# Patient Record
Sex: Female | Born: 1935 | Race: Black or African American | Hispanic: No | Smoking: Former smoker
Health system: Southern US, Community
[De-identification: ages and names within clinical notes are randomized; demographics above are authoritative.]

## PROBLEM LIST (undated history)

## (undated) DIAGNOSIS — I639 Cerebral infarction, unspecified: Secondary | ICD-10-CM

## (undated) DIAGNOSIS — K5900 Constipation, unspecified: Secondary | ICD-10-CM

## (undated) DIAGNOSIS — E559 Vitamin D deficiency, unspecified: Secondary | ICD-10-CM

## (undated) DIAGNOSIS — D649 Anemia, unspecified: Secondary | ICD-10-CM

## (undated) DIAGNOSIS — E0590 Thyrotoxicosis, unspecified without thyrotoxic crisis or storm: Secondary | ICD-10-CM

## (undated) DIAGNOSIS — F0390 Unspecified dementia without behavioral disturbance: Secondary | ICD-10-CM

## (undated) DIAGNOSIS — I499 Cardiac arrhythmia, unspecified: Secondary | ICD-10-CM

## (undated) DIAGNOSIS — G9340 Encephalopathy, unspecified: Secondary | ICD-10-CM

## (undated) DIAGNOSIS — N184 Chronic kidney disease, stage 4 (severe): Secondary | ICD-10-CM

## (undated) DIAGNOSIS — E119 Type 2 diabetes mellitus without complications: Secondary | ICD-10-CM

## (undated) HISTORY — DX: Constipation, unspecified: K59.00

## (undated) HISTORY — DX: Vitamin D deficiency, unspecified: E55.9

## (undated) HISTORY — DX: Unspecified dementia without behavioral disturbance: F03.90

## (undated) HISTORY — DX: Cardiac arrhythmia, unspecified: I49.9

## (undated) HISTORY — DX: Anemia, unspecified: D64.9

## (undated) HISTORY — DX: Cerebral infarction, unspecified: I63.9

## (undated) HISTORY — DX: Type 2 diabetes mellitus without complications: E11.9

---

## 1999-10-21 ENCOUNTER — Encounter: Payer: Self-pay | Admitting: Internal Medicine

## 1999-10-21 ENCOUNTER — Encounter: Admission: RE | Admit: 1999-10-21 | Discharge: 1999-10-21 | Payer: Self-pay | Admitting: Internal Medicine

## 2003-04-16 ENCOUNTER — Encounter: Admission: RE | Admit: 2003-04-16 | Discharge: 2003-04-16 | Payer: Self-pay | Admitting: *Deleted

## 2004-06-09 ENCOUNTER — Encounter: Admission: RE | Admit: 2004-06-09 | Discharge: 2004-06-09 | Payer: Self-pay | Admitting: Internal Medicine

## 2004-06-17 ENCOUNTER — Inpatient Hospital Stay (HOSPITAL_COMMUNITY): Admission: EM | Admit: 2004-06-17 | Discharge: 2004-07-23 | Payer: Self-pay | Admitting: Emergency Medicine

## 2004-06-17 ENCOUNTER — Ambulatory Visit: Payer: Self-pay | Admitting: Physical Medicine & Rehabilitation

## 2004-06-25 ENCOUNTER — Ambulatory Visit: Payer: Self-pay | Admitting: Critical Care Medicine

## 2004-06-28 ENCOUNTER — Ambulatory Visit: Payer: Self-pay | Admitting: Cardiology

## 2004-06-28 ENCOUNTER — Encounter: Payer: Self-pay | Admitting: Cardiology

## 2004-09-09 ENCOUNTER — Ambulatory Visit (HOSPITAL_COMMUNITY): Admission: RE | Admit: 2004-09-09 | Discharge: 2004-09-09 | Payer: Self-pay | Admitting: Internal Medicine

## 2004-09-15 ENCOUNTER — Inpatient Hospital Stay (HOSPITAL_COMMUNITY): Admission: EM | Admit: 2004-09-15 | Discharge: 2004-09-30 | Payer: Self-pay | Admitting: Emergency Medicine

## 2004-11-24 ENCOUNTER — Encounter: Admission: RE | Admit: 2004-11-24 | Discharge: 2004-11-24 | Payer: Self-pay | Admitting: Gastroenterology

## 2004-11-26 ENCOUNTER — Inpatient Hospital Stay (HOSPITAL_COMMUNITY): Admission: EM | Admit: 2004-11-26 | Discharge: 2004-11-29 | Payer: Self-pay | Admitting: Emergency Medicine

## 2005-01-28 ENCOUNTER — Ambulatory Visit (HOSPITAL_COMMUNITY): Admission: RE | Admit: 2005-01-28 | Discharge: 2005-01-28 | Payer: Self-pay | Admitting: Internal Medicine

## 2005-10-11 ENCOUNTER — Ambulatory Visit: Payer: Self-pay | Admitting: Gastroenterology

## 2005-10-20 ENCOUNTER — Encounter: Payer: Self-pay | Admitting: Gastroenterology

## 2005-10-20 ENCOUNTER — Ambulatory Visit (HOSPITAL_COMMUNITY): Admission: RE | Admit: 2005-10-20 | Discharge: 2005-10-20 | Payer: Self-pay | Admitting: Gastroenterology

## 2005-10-24 ENCOUNTER — Ambulatory Visit: Payer: Self-pay | Admitting: Gastroenterology

## 2006-02-15 IMAGING — CR DG ABDOMEN 2V
3 series · 3 of 3 positions shown · non-contrast
Comparison: Abdomen and pelvis CT dated 07/08/2004 and abdomen radiograph dated
07/07/2004.

CLINICAL DATA: Abdominal distention.

ABDOMEN - 2 VIEW

[view not recorded (1 of 3)]
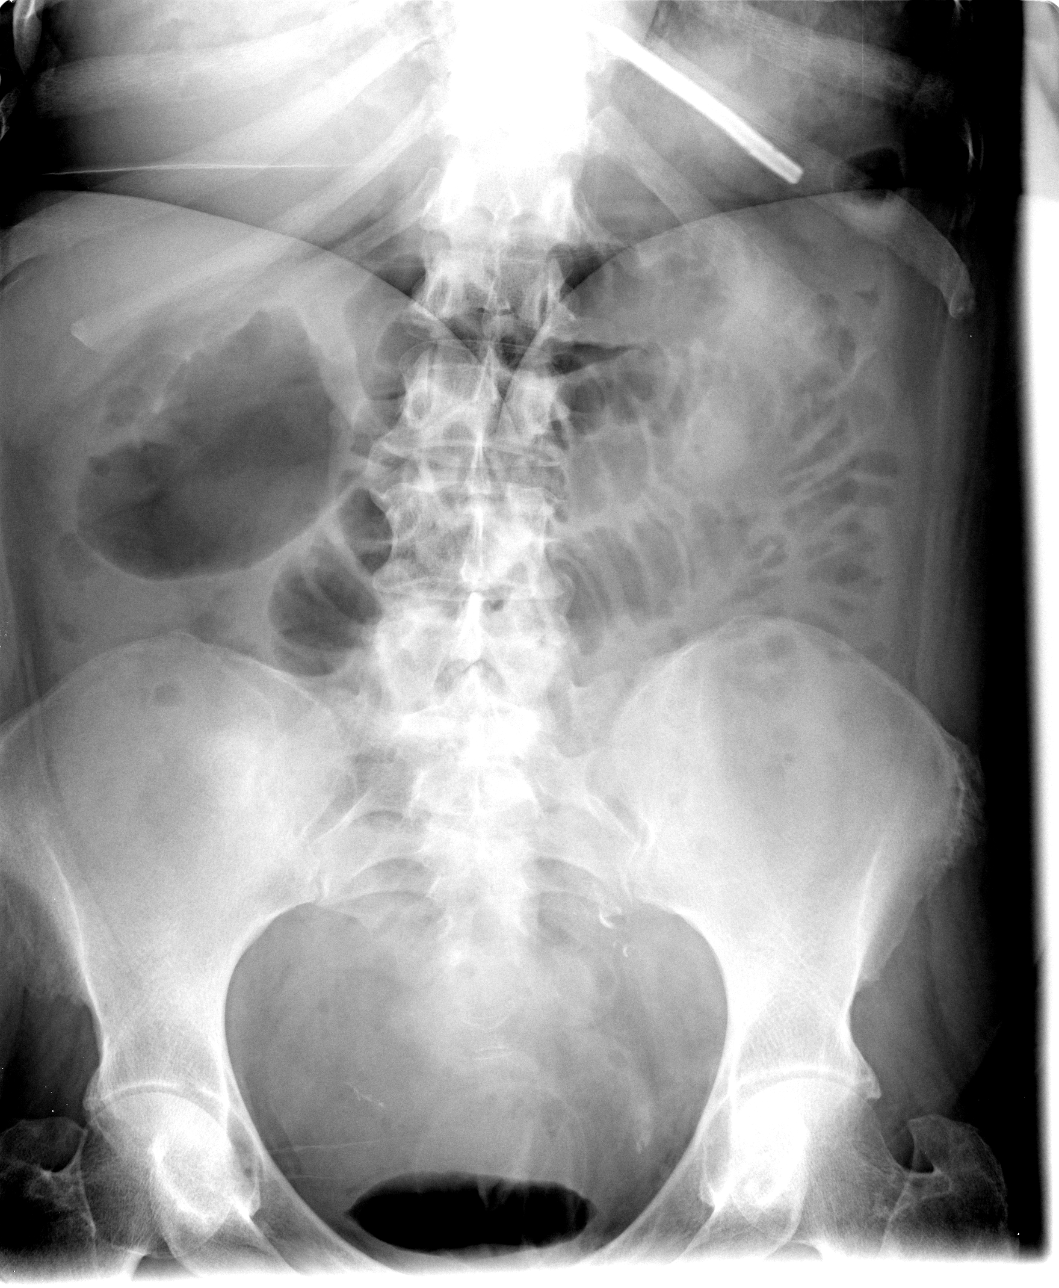

[view not recorded (2 of 3)]
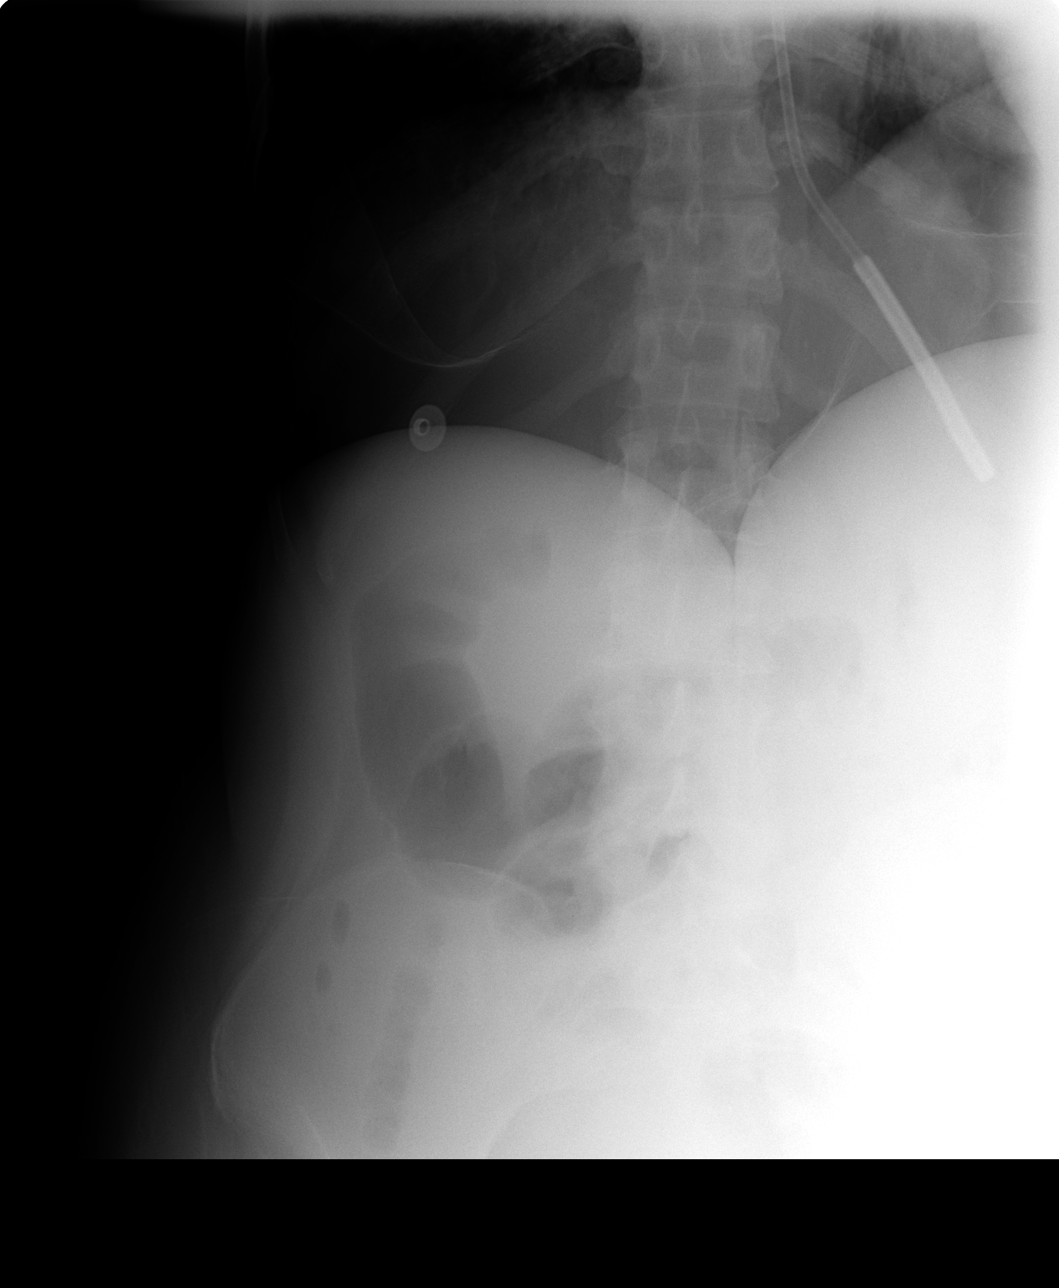

[view not recorded (3 of 3)]
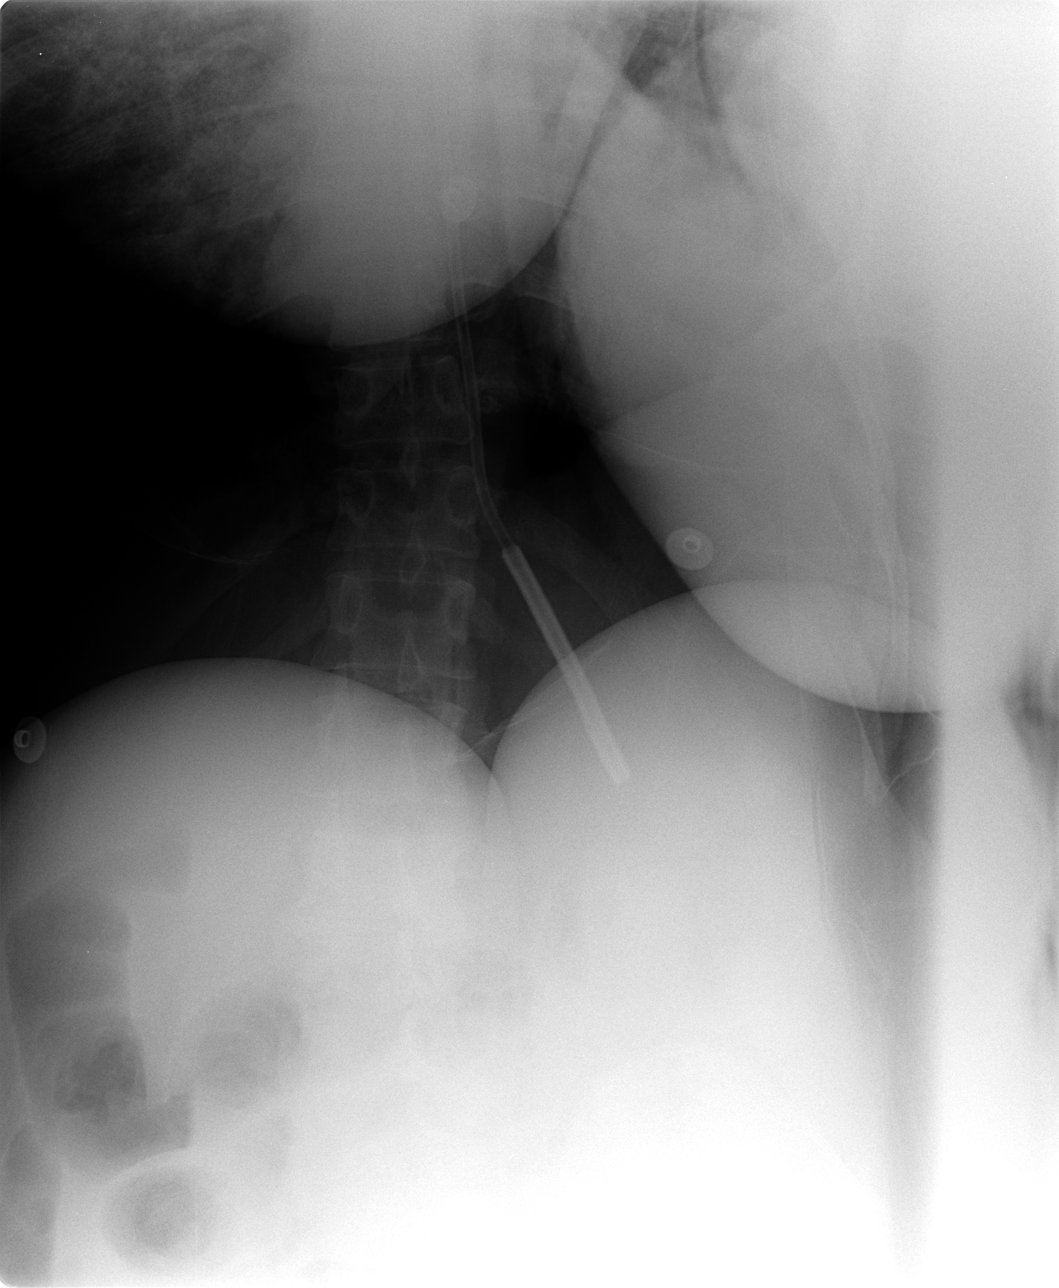

[3 of 3 positions shown; findings below may reference images not displayed]

FINDINGS: Supine and two left lateral decubitus views demonstrate a mild
decrease in caliber of multiple dilated small bowel loops in the mid and left
abdomen, compared to the CT scout image. No free peritoneal air. Feeding tube
tip in the proximal to mid stomach. Air, associated with a previously
demonstrated Foley catheter, is again demonstrated in the urinary bladder. A
rectal tube and balloon also remain in place. Mild scoliosis.

IMPRESSION

Mildly improved small bowel ileus or partial obstruction.

## 2006-08-02 ENCOUNTER — Inpatient Hospital Stay (HOSPITAL_COMMUNITY): Admission: EM | Admit: 2006-08-02 | Discharge: 2006-08-09 | Payer: Self-pay | Admitting: *Deleted

## 2006-08-05 ENCOUNTER — Encounter (INDEPENDENT_AMBULATORY_CARE_PROVIDER_SITE_OTHER): Payer: Self-pay | Admitting: *Deleted

## 2006-08-07 ENCOUNTER — Encounter (INDEPENDENT_AMBULATORY_CARE_PROVIDER_SITE_OTHER): Payer: Self-pay | Admitting: *Deleted

## 2007-03-21 ENCOUNTER — Ambulatory Visit (HOSPITAL_COMMUNITY): Admission: RE | Admit: 2007-03-21 | Discharge: 2007-03-21 | Payer: Self-pay | Admitting: Internal Medicine

## 2007-04-05 ENCOUNTER — Encounter (INDEPENDENT_AMBULATORY_CARE_PROVIDER_SITE_OTHER): Payer: Self-pay | Admitting: *Deleted

## 2007-04-05 ENCOUNTER — Ambulatory Visit (HOSPITAL_COMMUNITY): Admission: RE | Admit: 2007-04-05 | Discharge: 2007-04-05 | Payer: Self-pay | Admitting: Internal Medicine

## 2007-04-19 ENCOUNTER — Ambulatory Visit: Payer: Self-pay | Admitting: Cardiology

## 2008-07-21 ENCOUNTER — Ambulatory Visit: Payer: Self-pay | Admitting: Gastroenterology

## 2009-01-15 ENCOUNTER — Emergency Department (HOSPITAL_COMMUNITY): Admission: EM | Admit: 2009-01-15 | Discharge: 2009-01-16 | Payer: Self-pay | Admitting: Emergency Medicine

## 2010-03-07 ENCOUNTER — Encounter: Payer: Self-pay | Admitting: Internal Medicine

## 2010-05-18 LAB — PROTIME-INR
INR: 1.05 (ref 0.00–1.49)
Prothrombin Time: 13.6 seconds (ref 11.6–15.2)

## 2010-05-18 LAB — BASIC METABOLIC PANEL
BUN: 24 mg/dL — ABNORMAL HIGH (ref 6–23)
CO2: 27 mEq/L (ref 19–32)
Chloride: 110 mEq/L (ref 96–112)
Creatinine, Ser: 1.01 mg/dL (ref 0.4–1.2)
GFR calc non Af Amer: 54 mL/min — ABNORMAL LOW (ref >60)
Glucose, Bld: 94 mg/dL (ref 70–99)
Potassium: 4.3 mEq/L (ref 3.5–5.1)
Sodium: 141 mEq/L (ref 135–145)

## 2010-05-18 LAB — CBC: MCV: 81.9 fL (ref 78.0–100.0)

## 2010-05-18 LAB — DIFFERENTIAL
Basophils Absolute: 0 10*3/uL (ref 0.0–0.1)
Basophils Relative: 1 % (ref 0–1)
Eosinophils Relative: 2 % (ref 0–5)
Lymphocytes Relative: 35 % (ref 12–46)
Monocytes Relative: 5 % (ref 3–12)

## 2010-06-29 NOTE — Discharge Summary (Signed)
Sylvia Medina, Sylvia Medina              ACCOUNT NO.:  [id number]   MEDICAL RECORD NO.:  [id number]          PATIENT TYPE:  INP   LOCATION:  1526                         FACILITY:  [facility]   PHYSICIAN:  Isidor Holts, M.D.  DATE OF BIRTH:  Aug 19, 1935   DATE OF ADMISSION:  08/01/2006  DATE OF DISCHARGE:  08/09/2006                               DISCHARGE SUMMARY   For discharge diagnosis, procedures, consultations, clinical course,  discharge medications, refer to discharge summary dated August 08, 2006,  by Michaelyn Barter, M.D.   On review in the a.m. August 09, 2006, the patient's clinical condition  has remained stable.  There were no new issues overnight.  CBG's are  reasonable and laboratory findings show hemoglobin of 10.2 with  hematocrit of 31.0.  Review of bacteriologic studies indicate that urine  culture on August 02, 2006, grew over 100,000 colonies of Proteus  mirabilis sensitive only to the Cephalosporins.  The patient has  therefore been commenced on a 5-day course of Keflex 500 mg via tube  b.i.d.  She is otherwise considered clinically stable for discharge on  August 09, 2006, as planned.   ADDITIONAL DIAGNOSIS:  Proteus mirabilis urinary tract infection.   ADDITIONAL MEDICATION:  Keflex 500 mg via tube b.i.d. for 5 days only.      Isidor Holts, M.D.  Electronically Signed     CO/MEDQ  D:  08/09/2006  T:  08/09/2006  Job:  [id number]   cc:   Maxwell Caul, M.D.

## 2010-06-29 NOTE — H&P (Signed)
Sylvia Medina, POLENDO              ACCOUNT NO.:  [id number]   MEDICAL RECORD NO.:  [id number]          PATIENT TYPE:  EMS   LOCATION:  ED                           FACILITY:  [facility]   PHYSICIAN:  Mobolaji B. Bakare, M.D.DATE OF BIRTH:  [birth year]   DATE OF ADMISSION:  08/01/2006  DATE OF DISCHARGE:                              HISTORY & PHYSICAL   PRIMARY CARE PHYSICIAN:  Maxwell Caul, M.D.   CHIEF COMPLAINT:  Low hemoglobin, 6.2 gm/dl.   HISTORY OF PRESENTING COMPLAINT:  Sylvia Medina is an unfortunate 75-year-  old African-American female who had hypertensive intracerebellar bleed  in 2006, which resulted in neurological deficit.  She had been residing  in a skilled nursing facility since then.   Patient presents today after a lab work done at the nursing home  revealed a hemoglobin of 6.2 and MCV of 67.2.  The patient has no  specific complaints.  She denies hematemesis, hematochezia.  She denies  constipation or diarrhea.  Daughter is here today giving most of the  history.  The patient has baseline dementia.  Stool Hemoccults done in  the emergency room was positive.   Patient had colonoscopy done in August, 2007 for positive stool  Hemoccult.  According to the patient's daughter, the result was normal  colonoscopy.   REVIEW OF SYSTEMS:  There are no chills, shortness of breath, chest  pain, headaches, fever, nausea, vomiting, or diarrhea.  The patient is  incontinent of urine and probably stool.   PAST MEDICAL HISTORY:  1. Diabetes mellitus.  2. 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  3. Hypertensive intracerebellar bleed.  4. Dementia.  5. Status post stent placement in 2006.  6. Chronic anemia.  7. Hypothyroidism.  8. Gout.  9. Dementia.  10.History of sepsis/septic shock in October, 2006.  11.History of acute renal failure.   PAST SURGICAL HISTORY:  PEG tube placement in August, 2006.   CURRENT MEDICATIONS:  1. Lantus  10 units nightly.  2. Sliding-scale insulin.  3. Lexapro 20 mg daily.  4. Ferrous sulfate 300 mg daily.  5. Risperdal 0.5 mg daily.  6. Vitamins 500 cc daily.  7. Aricept 10 mg daily.  8. Beniprotein 1 scope t.i.d.  9. Jevity 1.2 300 cc via PEG q.4h. with water flushes.   ALLERGIES:  PENICILLIN.   FAMILY HISTORY:  Noncontributory.   SOCIAL HISTORY:  Patient is limited in activities of daily living.  She  resides in a skilled nursing facility.  Does not smoke cigarettes, does  not drink alcohol.   PHYSICAL EXAMINATION:  INITIAL VITALS:  Temperature 98.9, blood pressure  135/61, pulse 83, respiratory rate 20, O2 sats 97%.  GENERAL:  She is awake and alert.  Sometimes answers questions  inappropriately.  Patient is obese but not in respiratory distress.  NECK:  No elevated JVD.  No carotid bruits.  HEENT:  Mucous membranes moist.  LUNGS:  Clear clinically to auscultation.  CVS:  S1 and S2, regular.  ABDOMEN:  Nondistended.  Soft, nontender, bowel sounds present.  RECTAL:  She has stool soilage  on her buttocks.  Stool was brown in  color.  Heme positive.  Digital examination was not done.  EXTREMITIES:  No pedal edema or calf tenderness.  CNS:  Muscle power 4/5 in upper limbs, 3-4/5 in lower limbs.   INITIAL LABORATORY DATA:  Not available.  However, records from the  nursing home showed white cells 7.9, hemoglobin 6.2, hematocrit 22.7,  MCV 67.2, RDW 20.4, platelets 486.  Sodium 137, potassium 5.1, chloride  106, CO2 23, glucose 152, BUN 47, creatinine 1.18, calcium 8.5.   ASSESSMENT/PLAN:  Sylvia Medina is a 75 year old African-American female  who presented with low hemoglobin of 6.2.  She has a positive stool  Hemoccult.  She will be admitted for further evaluation.   ADMISSION DIAGNOSES:  1. Blood-loss anemia, likely chronic, with positive stool Hemoccult:      Will check anemia panel.  Transfuse with 2 units of packed red      cells.  Give Lasix 20-40 mg IV between the two  transfusions.  IV      Protonix 40 mg daily.  Will check PT/INR.  Will consult GI in the      morning.  2. Diabetes mellitus:  Patient will continue Jevity, as before.      Hence, will continue with Lantus and sliding scale.  She has a PEG      tube, so preparation will be much easier.  3. History of intracranial hemorrhage with neurological deficit.     Mobolaji B. Corky Downs, M.D.  Electronically Signed    MBB/MEDQ  D:  08/02/2006  T:  08/02/2006  Job:  [id number]   cc:   Maxwell Caul, M.D.

## 2010-06-29 NOTE — Consult Note (Signed)
Sylvia Medina, NEWHARD NO.:  [id number]   MEDICAL RECORD NO.:  [id number]          PATIENT TYPE:  INP   LOCATION:  1526                         FACILITY:  [facility]   PHYSICIAN:  Graylin Shiver, M.D.   DATE OF BIRTH:  07/08/1935   DATE OF CONSULTATION:  08/02/2006  DATE OF DISCHARGE:                                 CONSULTATION   REASON FOR CONSULTATION:  The patient is a 75 year old female who has a  prior history of any intracerebellar bleed in 2006.  She has a history  of hypertension.  She also has diabetes mellitus, dementia,  hypothyroidism, gout.   We are asked to see the patient in GI consultation because of severe  anemia.  The patient was found to have a hemoglobin of 6.2 with a low  MCV.  Serum iron studies are compatible with iron deficiency.  In  speaking at the daughter today over the telephone.  She reports that she  has been having some rectal bleeding which was reported at the nursing  home but not a great deal.  Her stool was found to be heme positive.   She had a PEG tube placed in 2006 by Dr. Luther Parody.  It seems to be  functioning fine.  There has been no blood reported from the PEG tube  according to the daughter.   Last year in September, she was seen by Dr. Wendall Papa from [facility] for anemia.  He did a colonoscopy which the daughter  reports turned out to be normal and nothing was found to explain the  anemia at that time.  She also had heme-positive stool at that time.   PAST SURGICAL HISTORY:  PEG tube placement.   MEDICATIONS:  Prior to his admission:  1. Lantus insulin.  2. Sliding scale insulin.  3. Lexapro.  4. Ferrous sulfate.  5. Risperdal.  6. Vitamins.  7. Aricept.  8. Beneprotein.  9. Jevity per PEG.   ALLERGIES:  Penicillin.   SOCIAL HISTORY:  Lives in a skilled nursing home.  Does not smoke or  drink alcohol.   PHYSICAL EXAMINATION:  GENERAL:  She is in no distress, nonicteric.  HEART:  Regular  rhythm.  No murmurs.  LUNGS:  Clear.  ABDOMEN:  Soft, nontender, no hepatosplenomegaly.  PEG tube is present  in left upper portion of abdomen   IMPRESSION:  1. Iron deficiency anemia.  2. Daughter reports rectal bleeding at the nursing home.   PLAN:  Discussed this all with the daughter.  We will proceed with EGD  and colonoscopy to evaluate both the upper and lower GI tract to look  for a source which could cause this significant anemia.  The patient  will be transfused.           ______________________________  Graylin Shiver, M.D.     SFG/MEDQ  D:  08/02/2006  T:  08/03/2006  Job:  [id number]   cc:   [facility]   Maxwell Caul, M.D.

## 2010-06-29 NOTE — Discharge Summary (Signed)
Sylvia Medina, Sylvia Medina              ACCOUNT NO.:  [id number]   MEDICAL RECORD NO.:  [id number]          PATIENT TYPE:  INP   LOCATION:  1526                         FACILITY:  [facility]   PHYSICIAN:  Michaelyn Barter, M.D. DATE OF BIRTH:  1936-01-21   DATE OF ADMISSION:  08/01/2006  DATE OF DISCHARGE:                               DISCHARGE SUMMARY   FINAL DIAGNOSES:  1. Severe anemia.  2. Fecal impaction.  3. Bradycardia.   PROCEDURES:  1. EGD done by Shirley Friar, MD, on August 04, 2006.  2. Attempted colonoscopy on June 20 and August 08, 2006.  3. Transfusion of at least 3 units of packed red blood cells.  4. Portable abdominal x-ray completed August 05, 2006.   CONSULTATIONS:  Gastroenterology, Shirley Friar, MD, [facility].   HISTORY OF PRESENT ILLNESS:  Sylvia Medina is a 75 year old female who had  routine lab work done at the nursing facility where she resides.  Hemoglobin was discovered to be 6.2.  She had no complaints at that  particular time.  She did have stool hemoccults done in the emergency  department which were found to be positive.   PAST MEDICAL HISTORY:  Please see that dictated by Mobolaji B. Bakare,  M.D.   HOSPITAL COURSE:  #1.  SEVERE ANEMIA:  The patient's hemoglobin was noted to be 6.2.  Gastroenterology was consulted on June 20.  Dr. Evette Cristal performed an EGD.  He stated there was normal mucosa in the esophagus, stomach, and  duodenum.  PEG site looked fine.  He saw nothing on the exam to explain  the patient's anemia.  He stated that he was going to attempt to do a  colonoscopy on the patient to evaluate her colon; however, upon rectal  examination, she had a fecal  impaction, and procedure could not be  done.  The patient was sent back up to the medicine floor.  A prep for  the patient to undergo a repeat attempt on colonoscopy was provided on  August 06, 2006, and on August 08, 2006, the patient underwent a  colonoscopy.  Per GI's report, the  colonoscopy had to be terminated due  to poor prep.  Brown-green stool was seen.  Dr. Bosie Clos indicated that  he suspected the bleeding was secondary to fecal impaction with what  looked like pressure ulcerating.  His handwriting is somewhat difficult  to understand.  He stated that he had no plans to further make a third  attempt at a colonoscopy.  He stated on June 24 that the patient showed  no other signs of bleeding.  No further GI workup needed to be  completed.  The patient's hemoglobin has responded to the transfusion of  blood, and as of August 08, 2006, her hemoglobin is 10.4.   #2.  FECAL IMPACTION:  The pt was disimpacted by the nurses and  subsequently began to have multiple bowel movements.   #3.  BRADYCARDIA:  It was documented that the patient had an episode of  bradycardia whereby her heart rate dropped to 52 on August 06, 2006.  She  was  otherwise asymptomatic.  There have been no repeat episodes  documented.   The patient has not complained of any pain during her hospitalization.  She showed no obvious distress.  She can answer simple questions but  otherwise did not engage in spontaneous conversation.  On August 08, 2006,  her vital signs showed temperature 97.0, heart rate 64, respirations 20,  blood pressure 110/60, O2 saturation 99% on room air.  Her sodium was  141, potassium 3.9, chloride 110, CO2 24, BUN 12, creatinine 1.06,  glucose 114, calcium 9.2.  WBC 9.1, hemoglobin 10.4, hematocrit 32.2,  and platelets 496.  I suspect that the patient may be discharged from  the hospital soon.   DISCHARGE MEDICATIONS:  1. Aricept 10 mg via PEG nightly.  2. Lexapro 20 mg via PEG daily.  3. Ferrous sulfate 300 mg via PEG daily.  4. Multivitamins 5 mL via PEG daily.  5. Protonix 40 mg via PEG daily.  6. MiraLax 17 g mixed in 8 ounces of fluid 3 times a day.  7. Risperdal 0.5 mg via PEG daily.  8. The patient was taking Lantus insulin 10 units subcutaneously      nightly.   This was subsequently held when the patient was being      prepped for her colonoscopy secondary to her glucose dropping.  It      has not been restarted due to the fact that the patient's sugars      have been relatively stable.  It may need to be restarted sometime      in the near future.      Michaelyn Barter, M.D.  Electronically Signed     OR/MEDQ  D:  08/08/2006  T:  08/08/2006  Job:  [id number]

## 2010-06-29 NOTE — Op Note (Signed)
Sylvia Medina, Sylvia Medina              ACCOUNT NO.:  [id number]   MEDICAL RECORD NO.:  [id number]          PATIENT TYPE:  INP   LOCATION:  1526                         [facility]ACILITY[facility]  [facility]   PHYSICIAN:  Shirley Friar, MDDATE OF BIRTH:  10-02-1935   DATE OF PROCEDURE:  08/07/2006  DATE OF DISCHARGE:                               OPERATIVE REPORT   PROCEDURE:  Colonoscopy.   INDICATION:  Rectal bleeding.   MEDICATIONS:  Fentanyl 12.5 mcg IV, Versed 1.5 mg IV.   PROCEDURE:  Large amount of solid stool noted around anorectal opening.  An adult Pentax colonoscope was inserted to a poorly prepped colon where  was a large amount of solid brownish green stool which prevented passage  of the colonoscope.  The procedure was therefore aborted due to poor  prep.   ASSESSMENT:  Aborted colonoscopy due to poor prep.   PLAN:  1. Suspect her rectal bleeding that occurred prior to admission was      secondary to fecal impaction with pressure ulceration.  2. No evidence of bleeding at this time.  3. Recommend stool softeners and MiraLax.  4. Watch for rebleeding.  5. No repeat colonoscopy planned at this time unless rectal bleeding      recurs.      Shirley Friar, MD  Electronically Signed     VCS/MEDQ  D:  08/07/2006  T:  08/08/2006  Job:  [id number]

## 2010-06-29 NOTE — Assessment & Plan Note (Signed)
HEALTHCARE                            CARDIOLOGY OFFICE NOTE   Sylvia Medina, Sylvia Medina                     MRN:          [id number]  DATE:04/19/2007                            DOB:          Feb 07, 1936    The patient is a 75 year old female with past medical history of a  hemorrhagic CVA, dementia, nursing home resident, No Code Blue Status,  diabetes mellitus, question coronary artery disease, hypothyroidism whom  we are asked to evaluate preoperatively prior to cataract surgery.  Note, the patient did have a hemorrhagic CVA in 2006.  This was felt to  be hypertensive intracerebellar bleed.  Note, she resides in a nursing  home and is unable to ambulate.  She does get placed in a wheelchair at  times.  She also has poor memory, although she can converse.  She  apparently has developed bilateral cataracts and will require general  anesthesia to remove these.  Cardiology was asked to further evaluate  prior to that.  Note, her daughter is her power of attorney and is with  her today.  She states that reason general anesthesia required for her  cataract removal is that the patient cannot remain still.  Note, the  patient denies any dyspnea, chest pain, palpitations, or syncope.  However, she does not ambulate, as described above.   PAST MEDICAL HISTORY:  Significant for diabetes mellitus in 2006.  She  also had hypertension at that time, but does not at present.  There is  no history of hyperlipidemia.  She does have a history of hypothyroidism  per the old notes.  She does not realize this at present.  She has had a  previous hemorrhagic CVA with an intracerebral bleed and had a previous  stent placed in her head.  She also has a history of anemia and GI blood  loss, is on a G tube.  There is also a history of gout and dementia.  She has had no previous surgeries.  Note, her daughter states she never  had coronary disease although there is note of  coronary disease in  previous records with no other documentation.   FAMILY HISTORY:  Is negative for coronary artery disease although there  are previous CVAs.   SOCIAL HISTORY:  She does not smoke, nor does she consume alcohol.   REVIEW OF SYSTEMS:  She denies any headaches or fevers, chills.  There  is no productive cough or hemoptysis.  There is no dysphagia,  odynophagia, melena or hematochezia.  There is no dysuria or hematuria.  No rash or seizure activity.  There is no orthopnea, PND, but can be  pedal edema.  Note, she does have chronic tremors.  She also has poor  memory.  She is unable to ambulate.  Remaining systems are negative.   PHYSICAL EXAMINATION:  Today shows of blood pressure 122/68 and pulse is  68.  She weighs 263 pounds.  She is well-developed and obese.  She does not appear to be in acute  distress.  Skin is warm and dry.  She does not appear to be depressed  although she has a very flat affect  in answering questions.  Her HEENT is normal with normal eyelids.  Neck is supple with a normal upstroke bilaterally.  No bruits noted.  There is no jugular distention and no thyromegaly is noted.  Her chest is clear to auscultation.  Normal expansion.  CARDIOVASCULAR:  Regular rate and rhythm.  Normal S1-S2.  There are no  murmurs, rubs, gallops noted.  ABDOMEN:  Exam shows no tenderness palpation.  I cannot palpate masses.  There is a G-tube in place.  She has positive bowel sounds.  There is no  hepatosplenomegaly.  Pulses are difficult to palpate and her femoral  area due to her obesity.  There are no bruits noted.  Her lower extremities show massive obesity.  There is also trace edema  bilaterally but cannot palpate cords.  Distal pulses are diminished.  Neurologic exam is difficult to assess.  She is bed-bound.  She is  conversive and answering questions.   Electrocardiogram shows sinus rhythm at a rate of 63.  There is a first-  degree AV block, but there  are no ST changes noted.   DIAGNOSIS:  1. Preoperative evaluation prior to cataract surgery.  I have had long      discussions with her daughter today, who is her power of attorney.      The cataract surgery is not a high risk procedure.  Note, the      patient's electrocardiogram is normal.  Also note she has not had      chest pain or shortness of breath, but she is not able to ambulate.      She does have risk factor of diabetes mellitus.  However, further      cardiac testing is not practical (if she did have an adenosine      Myoview, she would have to lie still for this procedure and she has      a constant tremor, which is why she is requiring general anesthesia      for cataract surgery).  I therefore think there will be some risk,      but I do not think, given her overall condition, that we should      pursue cardiac evaluation prior to that.  I have explained this to      the daughter and she is in agreement with that.  I also note that      the patient is a No Code Blue.  I think we can proceed with surgery      with the knowledge that there is some increased risk, although      probably acceptable given that she has normal electrocardiogram, no      symptoms and is low risk procedure.  2. Question history of coronary disease - there is a mention of this      in old records, but the daughter states that she has never had any      coronary disease.  3. Diabetes mellitus - per primary care physician.  Note, she would      benefit from a baby aspirin and a statin long term unless there are      other contraindications.  I will leave this to a primary care      physician.  I do not have the records concerning her previous      intracerebral hemorrhage.  If this is a contraindication, we would      leave her off  of aspirin.  4. History of hemorrhagic cerebrovascular accident.   I will see her back on as-needed basis.     Sylvia Frieze Jens Som, MD, [facility]  Electronically  Signed    BSC/MedQ  DD: 04/19/2007  DT: 04/19/2007  Job #: [id number]   cc:   Rachael Fee, MD

## 2010-06-29 NOTE — Op Note (Signed)
Sylvia Medina, Sylvia Medina              ACCOUNT NO.:  [id number]   MEDICAL RECORD NO.:  [id number]          PATIENT TYPE:  INP   LOCATION:  1526                         FACILITY:  [facility]   PHYSICIAN:  Graylin Shiver, M.D.   DATE OF BIRTH:  23-Apr-1935   DATE OF PROCEDURE:  08/05/2006  DATE OF DISCHARGE:                               OPERATIVE REPORT   PROCEDURE:  Esophagogastroduodenoscopy.   INDICATIONS FOR PROCEDURE:  Anemia.   PRE PROCEDURE:  Informed consent was obtained after explanation of the  risks of bleeding, infection and perforation.   PREMEDICATION:  None.   PROCEDURE:  With the patient in the left lateral decubitus position, the  Pentax gastroscope was inserted into the oropharynx and passed into the  esophagus.  It was advanced down the esophagus, then into the stomach  and into the duodenum.  The second portion and bulb of the duodenum were  normal.  The stomach had normal-appearing mucosa.  The patient had a PEG  in and the bolster was seen.  Everything around there looked fine; no  bleeding was present.  No ulcers, no tumors.  The esophagus looked  normal.  She tolerated the procedure well without complications.   IMPRESSION:  Normal mucosa in the esophagus, stomach and duodenum; with  PEG site noted which looked fine.  I see nothing on this exam to explain anemia.   I was going to attempt to do a colonoscopy on this patient to evaluate  the colon.  However, upon rectal examination she had a fecal impaction  and therefore the procedure could not be done           ______________________________  Graylin Shiver, M.D.     SFG/MEDQ  D:  08/05/2006  T:  08/05/2006  Job:  [id number]   cc:   [facility]

## 2010-07-02 NOTE — H&P (Signed)
NAMEKJERSTIN, ABRIGO              ACCOUNT NO.:  [id number]   MEDICAL RECORD NO.:  [id number]          PATIENT TYPE:  INP   LOCATION:  1823                         FACILITY:  [facility]   PHYSICIAN:  Tia Alert, MD     DATE OF BIRTH:  [birth year]   DATE OF ADMISSION:  06/17/2004  DATE OF DISCHARGE:                                HISTORY & PHYSICAL   CHIEF COMPLAINT:  Intracerebral hemorrhage with interventricular extension  and hydrocephalus.   HISTORY OF PRESENT ILLNESS:  This is a 75 year old black female with a  history of hypertension who got up to go to the bathroom about 9 o'clock  this morning.  Her husband then found her unresponsive about 10 o'clock and  called EMS.  They intubated her on the scene and brought her to the  emergency department where she was converted to an orotracheal intubation  and given Ativan and lidocaine.  She was Glasgow Coma Score 3 prior to  intubation.  She was taken emergently to CT scan which showed an  interventricular hemorrhage with hydrocephalus and neurosurgical  consultation was requested.   PAST MEDICAL HISTORY:  Hypertension, obesity, and gout.   MEDICATIONS:  1.  Colchicine 0.6 mg p.o. daily.  2.  Allopurinol 300 mg p.o. daily.  3.  Micardis 80/12.5 one p.o. daily.   ALLERGIES:  No known drug allergies.   PHYSICAL EXAMINATION:  VITAL SIGNS:  Vent rate 14, blood pressure 171/106,  down to 145/74, saturations 100%, heart rate 95.  GENERAL:  She is a moderately obese black female who is lying on a  stretcher.  HEENT:  Normocephalic and atraumatic.  Gaze is fixed straight ahead.  Pupils  are 3 mm and fixed.  HEART:  Regular rate and rhythm.  EXTREMITIES:  No cyanosis, clubbing, or edema.  NEUROLOGY:  She is Glasgow Coma Score of 6, E1V1M4.  She does flex her arms  to painful stimuli.  Again, her pupils are 3 mm and nonreactive.  She does  have a right corneal reflex with no real left corneal reflex.  She has a  weak gag.  Her  Babinski is equivocal and she has no clonus.  Unable to test  motor examination and sensory examination or cerebellar examination.   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  I have had  a long discussion with the family regarding her prognosis, which I believe  is poor.  However, I have seen people improve with ventriculostomy placement  with posterior fossa hemorrhages with  interventricular extension.  We have agreed to place ventriculostomy.  They  understand the risks and benefits of the procedure and they wish for me to  proceed.  We will then admit her to the ICU and control her blood pressure  with IV blood pressure medications and continue her Micardis through an NG  tube if possible.      DSJ/MEDQ  D:  06/17/2004  T:  06/17/2004  Job:  [id number]

## 2010-07-02 NOTE — Discharge Summary (Signed)
NAMELYNDON, CHENOWETH              ACCOUNT NO.:  [id number]   MEDICAL RECORD NO.:  [id number]          PATIENT TYPE:  INP   LOCATION:  3004                         FACILITY:  [facility]   PHYSICIAN:  Elliot Cousin, M.D.    DATE OF BIRTH:  1935/05/08   DATE OF ADMISSION:  09/15/2004  DATE OF DISCHARGE:  09/30/2004                                 DISCHARGE SUMMARY   ADDENDUM:  Ms. Gregg was discharged to a skilled nursing facility by Dr.  Michaelyn Barter on September 30, 2004.  During the hospital course, she was  treated for pyelonephritis and septic shock.  Antibiotic therapy during the  hospital course included Primaxin and Zosyn.  She was discharged to the  skilled nursing facility on a course of Cipro via the PEG tube.  On October 05, 2004, Jewel, from the [facility], called and reported that the urine  culture collected on the day of hospital discharge grew out vancomycin-  resistant Enterococcus.  The blood cultures were negative.  It appears that  the VRE, was selected out given the multiple antibiotics that the patient  had been recently treated with.  Per the report, the Enterococcus was only  sensitive to Linezolid (Zyvox).  This information was communicated to Dr.  Jacalyn Lefevre of [facility].  Per Dr. Hyacinth Meeker, he will  take care of the management hereafter.      Elliot Cousin, M.D.  Electronically Signed     DF/MEDQ  D:  10/06/2004  T:  10/06/2004  Job:  [id number]

## 2010-07-02 NOTE — Discharge Summary (Signed)
Sylvia Medina, Sylvia Medina              ACCOUNT NO.:  [id number]   MEDICAL RECORD NO.:  [id number]          PATIENT TYPE:  INP   LOCATION:  3004                         FACILITY:  [facility]   PHYSICIAN:  Renato Battles, M.D.     DATE OF BIRTH:  Aug 05, 1935   DATE OF ADMISSION:  09/15/2004  DATE OF DISCHARGE:                                 DISCHARGE SUMMARY   INTERIM SUMMARY:   DISCHARGE DIAGNOSES:  1.  Septic shock secondary to #2.  2.  Pyelonephritis.  3.  Acute renal failure secondary to acute tubular necrosis, resolved.  4.  Chronic anemia.  5.  Dysphagia requiring tube feeds.  6.  Type 2 diabetes.  7.  Hypokalemia, resolved.  8.  Decubitus ulcer stage III on the right heel and stage I on the sacrum.  9.  Dementia.  10. History of gout.  11. Depression.  12. Malnutrition.  13. Deconditioning.  14. Tube feeds.  15. History of hypothyroidism, currently in no treatment.   No consultations.   PROCEDURES:  1.  Head CT scan, September 15, 2004, showed small high-brilliance lesion in the      roof of the fourth ventricle on the left, with residual density from      previous remaining bleed, atrophy, and small vessel disease, with no      acute changes.  2.  MRI of the brain, September 16, 2004.  No acute changes.  3.  Renal ultrasound, September 20, 2004, showed normal kidney size, without      obstruction.  4.  PICC line placement, September 20, 2004.  5.  Hopefully PEG tube placement on Tuesday September 28, 2004.   HISTORY PHYSICAL AND HOSPITAL COURSE:  The patient is a 75 year old, African-  American female who had intracranial hemorrhage and hydrocephalus  necessitating a ventriculostomy by Dr. Yetta Barre back in May 2006.  She had  severe dysphagia postoperatively and made gradual improvement.  Stayed in  the hospital for a long time, but she was discharged and admitted shortly  thereafter for fall, and she was confused and combative.  She was noted to  be hypotensive and hypothermic, and diagnosis  of sepsis as a result of  pyelonephritis was made.  1.  Sepsis.  The patient was started on Zosyn empirically and responded      well.  She also required a lot of fluid boluses.  She was on dopamine      for supplementation for support of blood pressure.  Cultures revealed      more than 100,000 colonies of Klebsiella pneumoniae, and then after six      days of Zosyn, antibiotics were changed to Primaxin based on      sensitivity.  By my calculation, 14 days of coverage, I think the last      dose of Primaxin should be on Tuesday August 15.  That will give her a      total of 14 days of treatment combining the Zosyn and Primaxin.  The      patient is doing very well at this time from this standpoint.  2.  Acute renal failure.  This is thought to be secondary to ATN and      hypertension.  She now was checked and found to be 3.1.  She responded      well to IV fluids, and her BUN and creatinine came back to normal.  Last      creatinine was 1.1 on September 26, 2004.  Now, this patient in all      probability has chronic renal insufficiency, so I would not follow      closely the numbers of creatinine.  It is more likely to fluctuate      between normal and low abnormal.  This will not affect the actual      calculation of creatinine clearance by much.  3.  Anemia.  Ferritin check was elevated.  There is no iron deficiency, so      this an anemia of chronic disease.  4.  Hypothyroidism.  Free T4 was low, with normal TSH, on multiple checks.      At this time, the patient remains on no treatment, but supplementation      could be beneficial with Synthroid if discharging physician sees      appropriate.  5.  Dysphagia.  The patient failed multiple swallow evaluations initially      because of her inability to participate.  Shortly before I was able to      transfer the patient out of the unit, she became more awake and alert      and cooperative, and a swallowing evaluation showed the patient  failed      miserably.  She apparently has lost her ability to coordinate the      swallowing process altogether, and as such she is unsafe to try anything      per mouth.  That was what prompted me to order PEG tube placement by      interventional radiology, which is the main reason which is keeping the      patient in the hospital.  Otherwise, she will be able to go to a nursing      home.  The patient has periods of confusion, so she pulled at least      three Panda tubes out, and that is why she is restrained at this point.      We continue to feed the patient through the [facility] tube which is placed      at this time.  6.  Diabetes.  This was controlled with sliding scale and low dose of      Lantus.  7.  Hypokalemia.  Replaced and resolved.  8.  Decubitus ulcer.  This has been taken care of.  9.  Depression.  The patient expressed that she does not want to live, and      she does not want people to push her through.  She is a DNR as a code      status, but I feel there is depression involved, so I started the      patient on Lexapro.  This depression also may improve significantly if      thyroid studies continue to be abnormal and supplementation was deemed      appropriate.  10. Deconditioning.  The patient is on physical therapy and occupational      therapy and should continue to do so in the nursing home.  11. Malnutrition.  I ordered prealbumin to be checked prior to the patient's  discharge.  The patient's overall nutritional status is poor, and given      the many times that she pulled out her Panda tube, and recently put back      in, her tube feeds were interrupted multiple times.  This problem should      continue to be followed as an outpatient in the nursing home.   DISPOSITION:  The patient at this point is looking at nursing home  placement.  This could have been done on Friday if it were not for PEG tube placement, which requires bowel opacification with barium,  and this could  not be done until Monday.  Unfortunately, this was not done on Sunday, so  the PEG tube was postponed until Tuesday.      Renato Battles, M.D.  Electronically Signed     SA/MEDQ  D:  09/27/2004  T:  09/27/2004  Job:  [id number]

## 2010-07-02 NOTE — Discharge Summary (Signed)
Sylvia Medina, Sylvia Medina              ACCOUNT NO.:  [id number]   MEDICAL RECORD NO.:  [id number]          PATIENT TYPE:  INP   LOCATION:  3038                         FACILITY:  [facility]   PHYSICIAN:  Tia Alert, MD     DATE OF BIRTH:  November 23, 1935   DATE OF ADMISSION:  06/17/2004  DATE OF DISCHARGE:  07/23/2004                                 DISCHARGE SUMMARY   ADMITTING DIAGNOSES:  1.  Intracerebral hemorrhage with interventricular extension and      hydrocephalus.  2.  Diabetes.  3.  Hypertension.  4.  Gout.   PROCEDURE:  Right frontal ventriculostomy.   BRIEF HISTORY OF PRESENT ILLNESS:  Sylvia Medina is a 75 year old white female  who was brought to the [facility] Emergency Department  when her  husband found her unresponsive about 10 o'clock in the morning on Jun 17, 2004.  She was intubated on the scene and brought to the emergency  department by EMS where they converted her to an orotracheal intubation and  given Ativan and lidocaine.  She was a Glasgow coma score of 3 prior to the  intubation, according to the emergency room physicians.  She was taken  emergently for a CAT scan which showed a cerebellar vermian hemorrhage with  interventricular extension and hydrocephalus.  A neurosurgical consultation  was requested.  When I saw the patient, she was a Glasgow score of 6, E1 V1  M4.  She flexed her arms to painful stimuli.  The pupils were 3 mm and  nonreactive.  The first CT scan showed a large vermian hemorrhage with  interventricular extension and hydrocephalus.  I spoke to the family  regarding her prognosis and talked about options.  I did not feel that she  would benefit from surgery to remove the blood from the vermis, but I  thought she may benefit from a ventriculostomy drain placement.  They  consented for the procedure, and a ventriculostomy drain was placed after  admission to the neurosurgical ICU.   HOSPITAL COURSE:  The patient had a ventriculostomy  drain placed on Jun 17, 2004 in the neurosurgical ICU.  She had repeated serial CT scans which  showed some resolution of the hydrocephalus. I did inject TPA into the  ventriculostomy drain to break up the clot, and she had excellent drainage  of clear CSF following this and had resolution her hydrocephalus.  I asked  the pulmonary critical care doctors to follow her along during her hospital  course.  She was intubated.  We began to wean her ventriculostomy, and her  neurological exam began to improve slowly.  We placed a Panda tube for  feedings.  By hospital day #4, she began to follow commands for the nurses  on an occasional basis and she began to open her eyes spontaneously.  By Jun 24, 2004, she was much more awake and interactive.  She briskly followed  commands, moving all four extremities weakly.  Her ventriculostomy was  patent but showing decreasing output.  Her follow-up CT scan looked quite  good.  On Jun 25, 2004, she had a fever spike up to 103.  Urinalysis was  clear.  Blood cultures were pending.  Her chest x-ray showed some  atelectasis.  Neurological exam was unchanged.  The pulmonary critical care  physicians followed her and helped wean her from her ventriculostomy.  We  started her on vancomycin and Rocephin for ventriculostomy prophylaxis.  They switched this to vancomycin and Zosyn for empiric treatment of possible  pneumonia.  There was a change on her EKG at one point around Jun 27, 2004,  and her cardiac enzymes were slightly increased but trending downward at  that time.  The pulmonary physicians wondered if she had a small inferior  MI.  She became hyponatremic with sodium down to 129 at this time also.  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  She continued on her antibiotics.  We continued her ventriculostomy  drain, and she was extubated on Jun 28, 2004.  On Jun 29, 2004, she was  doing better.  She was denying  headache.  She was following commands. She  stated her name.  Her ventriculostomy drain was raised to 20 cm on this day  in hopes of weaning it enough to remove it.  We removed the ventriculostomy  drain on Jul 01, 2004, and she continued to do well after this was removed.  She was awake and alert and much more appropriate and conversive.  She was  transferred to the acute care unit.  She was tolerating her extubation.  She  also continued on her tube feeds.  She then began to have some diarrhea.  It  was felt that she might need PEG placement, and GI was consulted.  They sent  a Clostridium difficile toxin, and this was negative on two separate  occasions.  Follow-up head CT showed no hydrocephalus after removal of the  drain.  The GI doctors recommended that we repeat speech pathology  evaluation to consider a swallowing study.  We continued her on her tube  feeds, and she was continued along on this course.  We finally transferred  her to the floor when she was afebrile.  She had a swallowing evaluation  which showed that she could tolerate a diet, and we advanced this, and she  continued to do well with an oral diet.  She was taken off her antibiotics.  Physical and occupational therapy worked with the patient.  We began to look  for nursing home placement.  She was accepted to a nursing home on July 23, 2004.   DISCHARGE MEDICATIONS:  1.  Nitroglycerin ointment 1 inch topically q.6 h.  2.  Lantus insulin 8 units subcutaneously q.12 h.  3.  Ferrous sulfate 325 mg p.o. t.i.d.  4.  Lopressor 50 mg p.o. q.6 h.  5.  Pepcid 20 mg p.o. b.i.d.  6.  She was on sliding scale insulin.  7.  Ensure three time a day.  8.  Ventolin inhaler q.4 h. as needed.   FOLLOW UP:  Her return appointment is in three weeks with Tia Alert,  M.D.       DSJ/MEDQ  D:  07/23/2004  T:  07/23/2004  Job:  [id number]

## 2010-07-02 NOTE — Op Note (Signed)
[name], [name]              ACCOUNT NO.:  [id number]   MEDICAL RECORD NO.:  [id number]          PATIENT TYPE:  INP   LOCATION:  3111                         FACILITY:  [facility]   PHYSICIAN:  Tia Alert, MD     DATE OF BIRTH:  Nov 28, 1935   DATE OF PROCEDURE:  06/17/2004  DATE OF DISCHARGE:                                 OPERATIVE REPORT   PREPROCEDURE DIAGNOSIS:  Intracerebral hemorrhage with interventricular  extension and hydrocephalus.   PROCEDURE:  Right ventriculostomy drain placement.   SURGEON:  Dr. Marikay Alar.   PROCEDURE IN DETAIL:  The patient was in the ICU in the supine position with  her head of bed elevated 30 degrees. Her right frontal region was shaved and  then prepped with DuraPrep and draped in usual sterile fashion. Two  milliliters of local anesthesia was injected and then a stab incision was  made in the mid pupillary line just behind the hairline on the right side. A  twist drill craniotomy was then performed. The dura was punctured and a  ventriculostomy drain was placed through the right frontal region into the  right lateral ventricle with good flow of pinkish CSF. It was tunneled  through a separate stab incision and connected to a closed distal reservoir  system. The drain was sewn into position and an occlusive dressing was  placed at the end of the procedure. The patient seemed to tolerate procedure  well.      DSJ/MEDQ  D:  06/17/2004  T:  06/17/2004  Job:  [id number]

## 2010-07-02 NOTE — Discharge Summary (Signed)
Sylvia Medina, Sylvia Medina              ACCOUNT NO.:  [id number]   MEDICAL RECORD NO.:  [id number]          PATIENT TYPE:  INP   LOCATION:  3004                         FACILITY:  [facility]   PHYSICIAN:  Michaelyn Barter, M.D. DATE OF BIRTH:  October 22, 1935   DATE OF ADMISSION:  09/15/2004  DATE OF DISCHARGE:  09/30/2004                                 DISCHARGE SUMMARY   ADDENDUM   This discharge summary follows that dictated on August 14.  Please refer to  dictation for a more thorough explantation of what took place over the  course of the patient's hospitalization.  This discharge summary will detail  those events that took place between the time period of September 28, 2004,  until today, September 30, 2004.   DISCHARGE DIAGNOSES:  1.  Septic shock.  2.  Pyelonephritis.  3.  Acute renal failure secondary to acute tubular necrosis.  4.  Chronic anemia.  5.  Dysphagia requiring tube feeds.  6.  Type 2 diabetes.  7.  Hypokalemia.  8.  Decubitus ulcer, stage III on the right heel and stage I on the sacrum.  9.  Dementia.  10. Depression.  11. Malnutrition.  12. Deconditioning.  13. Mental status changes/delirium.   CONSULTATIONS:  1.  Interventional radiology.  2.  Psychiatry with Dr. Jeanie Sewer on September 29, 2004.   PROCEDURES:  1.  Gastrostomy tube placed on September 28, 2004.  2.  One unit of paced red blood cells were transfused on September 29, 2004.   HOSPITAL COURSE:  Please see previous dictation.   Problem 1.  POOR NUTRITIONAL INTAKE:  Because of this, the patient had a  gastrostomy tube placed on September 28, 2004.  This was completed by  interventional radiology.  It appeared as though the procedure went well and  tube feeds were started the following day.  The patient appeared to tolerate  the tube feeds well over the course of this time.   Problem 2.  ALTERED MENTAL STATUS/DELIRIUM:  When I saw the patient on  August 16, the patient stated that she had been seeing snakes in her  room  and that she believed that she was back home in her bedroom.  Likewise, she  stated that the year was 1976, and that she did not feel good secondary to  seeing these snakes in her room.  She otherwise appeared to be in no other  obvious distress with no sign of respiratory compromise, however, it was  clear that the patient had some mental status changes at which time I  believe to be secondary to delirium.  We consulted psychiatry, Dr.  Jeanie Sewer.  Dr. Jeanie Sewer reported that the patient appeared to have  depression along with Axis I delirium, not otherwise specified, which may  have been contributed from the various comorbidities that the patient had  been experiencing over the course of her hospitalization.  He recommended  continuing Lexapro that the patient was previously on 10 mg daily for  antidepressant and he started the patient on Seroquel 25 mg nightly  instructing that the dose should be increased as needed  and/or as tolerated  by 25 mg per day, so approximately 75 mg nightly for antipsychosis.  He also  went on to state that one could consider Aricept or Namendia.  Today, when I  saw the patient, she was not complaining of any snakes in her room and  appeared to be more cooperative.  Therefore, the decision has been made to  discharge the patient.  The patient will be discharged today.   DISCHARGE MEDICATIONS:  1.  Dulcolax 10 mg nightly.  2.  Ciprofloxacin 250 mg b.i.d. via her PEG.  I started this today, August      17, to cover a urinary tract infection.  The patient should complete a      10-day course of this antibiotic given the fact that she was so severe      over the course of her hospitalization.  3.  Lexapro 10 mg via her PEG a day.  4.  Lantus insulin 10 units subcu nightly.  5.  Jevity 1.2 calorie nutritional supplement 390 mL via her tube q.4h.  6.  Protonix 40 mg via her tube q.24h.  7.  Resource protein supplement 240 mg q.6h.  8.  Seroquel 25 mg p.o.  daily.  9.  Tylenol 650 mg p.r.n. q.4h.  10. The patient had completed a 10-day course of Primaxin 500 mg IV q.8h.      The last dose was given today, August 17.      Michaelyn Barter, M.D.  Electronically Signed     OR/MEDQ  D:  09/30/2004  T:  09/30/2004  Job:  [id number]

## 2010-07-02 NOTE — Consult Note (Signed)
Sylvia Medina, Sylvia Medina              ACCOUNT NO.:  [id number]   MEDICAL RECORD NO.:  [id number]          PATIENT TYPE:  INP   LOCATION:  3106                         FACILITY:  [facility]   PHYSICIAN:  Althea Grimmer. Santogade, M.D.DATE OF BIRTH:  13-Jun-1935   DATE OF CONSULTATION:  07/08/2004  DATE OF DISCHARGE:                                   CONSULTATION   Sylvia Medina is a 75 year old female whom I am asked to see for  few  gastrointestinal issues, those being feeding difficulties, vomiting, and  diarrhea.  She was admitted to the hospital on Jun 17, 2004, with an  intercerebral hemorrhage with development of hydrocephalus requiring  placement of a ventriculostomy catheter.  She has had considerable recovery  since that time and currently is quite talkative.  She underwent a  swallowing study on Jul 02, 2004, which reportedly showed severe  oropharyngeal dysphagia with aspiration.  Reevaluation on Jul 06, 2004, was  consistent with little to no progress in her swallowing recovery.  A second  problem is that she has been having diarrhea on her tube feedings.  C.  difficile toxin titer was negative on Jul 06, 2004.  Fecal occult blood  testing is positive.  Her hemoglobin is 9.6.  A CT scan of chest and abdomen  done today demonstrates bibasilar pneumonia. She has a panda tube in the  duodenum and a nasogastric tube to low suction in the stomach.  The CT scan  reveals an ileus but no bowel obstruction.  Her uterus has a hypodense area  in the central part and a follow-up pelvic ultrasound is recommended.  To  me, the patient denies any abdominal pain at this time.   PAST MEDICAL HISTORY:  1.  Hypertension.  2.  Recent intracerebral hemorrhage with transient hydrocephalus.  3.  Gout.  4.  Arthritis.   CURRENT MEDICATIONS:  1.  Avapro 300 mg daily.  2.  Albuterol nebulizer.  3.  Sliding scale insulin.  4.  Nitroglycerin.  5.  Aranesp.  6.  Ferrous sulfate 300 mg daily.  7.  Metoprolol 50  mg q.6h.   Dictation ended at this point.       PJS/MEDQ  D:  07/08/2004  T:  07/08/2004  Job:  [id number]

## 2010-07-02 NOTE — Consult Note (Signed)
Sylvia Medina, OMAHONEY              ACCOUNT NO.:  [id number]   MEDICAL RECORD NO.:  [id number]          PATIENT TYPE:  INP   LOCATION:  3106                         FACILITY:  [facility]   PHYSICIAN:  Althea Grimmer. Santogade, M.D.DATE OF BIRTH:  November 01, 1935   DATE OF CONSULTATION:  07/08/2004  DATE OF DISCHARGE:                                   CONSULTATION   CONTINUATION:   CURRENT MEDICATIONS:  1.  Zantac 150 mg q.12h.  2.  Lactinex probiotic.  3.  Questran 4 gm q.12h.  4.  Zosyn IV.   ALLERGIES:  None reported.   FAMILY HISTORY:  Noncontributory.   SOCIAL HISTORY:  Former smoker.  Nondrinker.   REVIEW OF SYSTEMS:  Not obtainable.   PHYSICAL EXAMINATION:  GENERAL:  She is an obese elderly female in no acute  distress.  VITAL SIGNS:  Afebrile.  Blood pressure 138/50, pulse 116.  SKIN:  Normal.  HEENT:  Eyes - anicteric.  Conjunctivae pink.  Oropharynx normal.  The  patient has NG and panda tube in.  She has good control on elevating her  palate, opening her mouth, moving her lips, and moving her tongue in all  directions.  Her speech is clear.  CHEST:  Chest sounds clear anteriorly.  There are a few rales at the base.  HEART:  Heart sounds mildly tachycardic.  ABDOMEN:  Obese, soft, and nontender.  Bowel sounds are hypoactive.  RECTAL:  Not performed.  EXTREMITIES:  Interesting morbid obesity of her proximal lower extremities,  and trace edema of her lower lower extremities.   LABORATORY TESTS:  Hemoglobin of 9.6, white blood count 10.3, platelet count  607.  Blood sugar is currently 113, creatinine 1.6, BUN 45, prealbumin 12.3.  Fecal occult blood testing positive.  Clostridium difficile toxin titer  negative Jul 06, 2004.   IMPRESSION:  Elderly female, status post intracerebral hemorrhage who has  had moderate recovery of function.  She is anemic and guaiac positive, and  has had oropharyngeal dysphagia and diarrhea.  I am not totally convinced  that she will not have return  of swallowing function in short order,  considering how clear her speech is and how well she seems to be controlling  her tongue and her palate.  Her diarrhea could be secondary to the tube  feedings.  Clostridium difficile is a possibility, but unlikely in light of  her recent negative test.  A repeat is pending.  Her anemia and fecal blood  positivity should be worked up endoscopically, at least with a colonoscopy.  The CT scan did not demonstrate any bowel pathology or obstruction.   RECOMMENDATIONS:  1.  Repeat speech pathology evaluation in the seated position before      proceeding with PEG.  2.  Await Clostridium difficile toxin titer.  3.  If she again fails her speech pathology swallowing study, consideration      will be given to placing a PEG.  This would also allow [place] to make sure      there is no upper GI pathology contributing to the recent episode of  vomiting.  However, that might simply be due to the panda tube being      dislodged and retroflexed in the stomach.  At the time of an upper      endoscopy, a quick flexible sigmoidoscopy should also rule out      Clostridium difficile.  If she has a PEG tube placed or is sufficiently      recovered to orally prep for a colonoscopy, this will be performed, but      probably not until next week.   All of the above need to be discussed with the family to consent for any  procedures.       PJS/MEDQ  D:  07/08/2004  T:  07/08/2004  Job:  [id number]   cc:   Tia Alert, MD  Fax: [phone]

## 2010-07-02 NOTE — Discharge Summary (Signed)
Sylvia Medina, Sylvia Medina              ACCOUNT NO.:  [id number]   MEDICAL RECORD NO.:  [id number]          PATIENT TYPE:  INP   LOCATION:  5041                         FACILITY:  [facility]   PHYSICIAN:  Mobolaji B. Bakare, M.D.DATE OF BIRTH:  1935-04-02   DATE OF ADMISSION:  11/25/2004  DATE OF DISCHARGE:  11/29/2004                                 DISCHARGE SUMMARY   PRIMARY CARE PHYSICIAN:  Dr. Kirtland Bouchard.   FINAL DIAGNOSES:  1.  Urinary tract infection with Escherichia coli and enterococcus.  2.  Pyelonephritis.  3.  Septic shock.  4.  Hypothermia, resolved, thought to be secondary to autonomic dysfunction/      sepsis.  5.  Acute renal failure, resolved.  6.  Diabetes mellitus.  7.  Hypotension, resolved.  8.  History of intracerebral hemorrhage in May 2006 secondary to      hypertensive bleed.  9.  Chronic anemia.  10. Decubitus ulcer.  11. Status post PEG placement for dysphagia.  12. Deconditioning.  13. Depression.  14. Dementia.   BRIEF HISTORY:  Ms. Horgan is a 75 year old African-American female who was  admitted from [facility] facility secondary to hypotension,  hypothermia.  She had a positive UA in the emergency department.  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  Of note is that Mrs.  Mette was seen and admitted at [facility] some time in August  2006 with pyelonephritis, septic shock, and hypothermia.  At that time a  urine culture grew Klebsiella pneumoniae which was resistant to ceftriaxone.  She was treated with imipenem and symptoms resolved.  Prior to transfer from  [facility] facility she had been treated for methicillin-  resistant Staph aureus from a left ear wound with nitrofurantoin and  Bactrim.   HOSPITAL COURSE:  #1 - PYELONEPHRITIS/UROSEPSIS:  Ms. Pless was initially  admitted with a temperature of 95.9  and a blood pressure of 113/55.  She was  transferred to stepdown unit and was placed on warming protocol with warmed  IV fluid and Bair Hugger.  She was empirically started on Zyvox in view of  recent urine culture.  Temperature improved.  Blood pressure was stable.  Urine culture came back growing Enterococcus which is sensitive to  ampicillin, vancomycin and E. coli sensitive to Ampicillin, ceftriaxone,  cefazolin, resistant to ciprofloxacin.  Cipro which was initially added to  cover gram-negative organism was then discontinued.  Patient will be  discharged home on Ampicillin 500 mg p.o. t.i.d.  to complete two weeks  course of antibiotics.  Of note is that there was no growth on blood  culture.   #2 - HYPOTHERMIA:  Patient responded to IV fluid warming and use of Bair  Hugger for external warming.  At the time of discharge, temperature was  97.3.  According to daughter, she stated that her mom had unstable  temperature since she had a stroke in May 2006.  This hypothermia may be  related to autonomic  dysfunction and sepsis from pyelonephritis.   #3 - RELATIVE HYPOTENSION:  She had a lowish blood pressure on arrival and  she was treated with IV fluids which she responded.  Blood pressure at the  time of discharge ranged between 130-145 systolic and 45-65 diastolic.   DISCHARGE MEDICATIONS:  1.  Ampicillin 500 mg p.o. t.i.d. to complete on December 09, 2004.  2.  Aricept 5 mg p.o. q.h.s.  3.  Lexapro 10 mg p.o. via tube daily.  4.  Prevacid 30 mg per tube daily.  5.  Seroquel 25 mg in the morning and 50 mg in the evening.  6.  ProMod one scoop per PEG three times a day.  7.  Jevity 1.2 390 mL per tube q.4h.  8.  Free water boluses 120 mL q.4h.  9.  Dulcolax suppository one q.h.s. at bedtime.  10. Lantus insulin 10 units subcutaneous q.h.s.  11. NovoLog sliding scale q.6h.  12. Vicodin 5/500 q.6h. p.r.n. pain.  13. Reglan 10 mg via PEG q.6h. to complete on December 09, 2004.    CONDITION ON DISCHARGE:  Patient was hemodynamically stable.  Temperature  97.3, blood pressure 130/45, saturating at 98% on room air.   DISCHARGE LABORATORIES:  Sodium 140, potassium 3.9, chloride 110,  bicarbonate 23, BUN 29, creatinine 1.2, glucose 83, calcium 9.2.  White  cells 5.6,  hemoglobin 8.6, hematocrit 26.8, platelets 317.  Urine culture  greater than 100,000 colonies of E. coli and enterococcus species.  E. coli  sensitive to ampicillin, cefazolin, ceftriaxone, gentamicin, nitrofurantoin;  resistant to nitrofurantoin, resistant to ciprofloxacin and Levaquin.  Enterococcus species sensitive to ampicillin, nitrofurantoin, and  vancomycin; resistant to levofloxacin.  Blood cultures negative.      Mobolaji B. Corky Downs, M.D.  Electronically Signed     MBB/MEDQ  D:  11/29/2004  T:  11/29/2004  Job:  [id number]   cc:   Maxwell Caul, M.D.  Fax: [phone]

## 2010-07-02 NOTE — Assessment & Plan Note (Signed)
[facility]                           GASTROENTEROLOGY OFFICE NOTE   Sylvia, Medina                     MRN:          [id number]  DATE:10/11/2005                            DOB:          August 17, 1935    REASON FOR REFERRAL:  Dr. Leanord Hawking asked me to evaluate Sylvia Medina in  consultation regarding heme positive anemia, recent diarrhea.   HISTORY OF PRESENT ILLNESS:  Sylvia Medina is a 75 year old woman with  multiple serious medical issues.  Specifically she has diabetes, status post  intracerebral hemorrhage in May 2006 that has left her unable to move well  or feed herself.  Status post PEG placement in 2006 by Dr. Luther Parody from  [facility].  Sylvia Medina is unable to give much of her history  but her daughter is here with her in the office and she explained that about  two months ago Sylvia Medina was given some antibiotics for a toe infection.  Around that time she began having perfuse watery diarrhea.  The antibiotics  were stopped and the diarrhea improved but she still has some loose stools  about twice a day.  She has had multiple checks for C-difficile all of which  were negative.  She had recent lab tests done about four weeks ago showing a  hemoglobin of 9.8 with an MCV of 80, normal platelets, normal INR.  A  complete metabolic profile was essentially normal except for a albumin of  3.0.  Heme tests were recently done by one of her care providers and she was  found to be heme positive.  It is not clear that she has ever had a  colonoscopy.   REVIEW OF SYSTEMS:  Patient unable to give review of systems due to relative  dementia, poor historian.   PAST MEDICAL HISTORY:  Diabetes, intracerebral hemorrhage May 2006, status  post PEG May 2006, history of decubitus ulcers, history of cellulitis.   CURRENT MEDICATIONS:  Provided by the [facility] from her nursing home.  1. Lexapro.  2. Iron.  3. Risperdal.  4. Aricept.  She is fed  by a PEG tube that as I said was placed by [facility]  in 2006.   ALLERGIES:  No known drug allergies.   SOCIAL HISTORY:  Widowed, lives in a nursing home, non-smoker, non-drinker.   FAMILY HISTORY:  No history of colon cancer or colitis.   PHYSICAL EXAMINATION:  Unable to move, confined to a wheelchair, weight  estimated in the upper 200's, low 300's.  Height 5 feet 5 inches.  Blood  pressure 160/90, pulse 128.  CONSTITUTIONAL:  Chronically ill appearing, morbidly obese woman.  NEUROLOGIC:  Alert and oriented x2.  LUNGS:  Clear to auscultation.  HEART:  Slight tachycardic, otherwise normal.  ABDOMEN:  Soft, non-tender, non-distended, normal bowel sounds.  EXTREMITIES:  No lower extremity edema.  SKIN:  No rash, lesions or visible scars.   ASSESSMENT/PLAN:  A 75 year old woman with heme positive anemia recent  diarrhea.   Her diarrhea does seem to have improved and likely was antibiotic related  although it is not clear that she had  C-difficile infection.  She does have  heme positive anemia and I will arrange for her to have a colonoscopy at her  soonest convenience.  She has a PEG tube so that will ease prep.  The PEG  tube can be used for her prep.                                   Rachael Fee, MD   DPJ/MedQ  DD:  10/11/2005  DT:  10/12/2005  Job #:  [id number]   cc:   Maxwell Caul, MD

## 2010-07-02 NOTE — H&P (Signed)
Sylvia Medina, Sylvia Medina              ACCOUNT NO.:  [id number]   MEDICAL RECORD NO.:  [id number]          PATIENT TYPE:  INP   LOCATION:  3306                         FACILITY:  [facility]   PHYSICIAN:  Lonia Blood, M.D.       DATE OF BIRTH:  Mar 26, 1935   DATE OF ADMISSION:  11/25/2004  DATE OF DISCHARGE:                                HISTORY & PHYSICAL   PRIMARY CARE PHYSICIAN:  Dr. Baltazar Najjar with [facility].   CHIEF COMPLAINT:  Hypothermia.   HISTORY OF PRESENT ILLNESS:  Sylvia Medina is a 75 year old African-American  with history of stroke and diabetes, a resident of [facility], who was brought to [facility] emergency room this a.m. for sensation  of feeling cold. The patient was found to be hypotensive and hypothermic in  the skilled nursing facility tonight and she was sent emergently to [facility]. The patient had a long hospital stay in August 2006 at [facility] for pyelonephritis, septic shock, and hypothermia. At the  time, the cultures were positive for Klebsiella pneumonia that was resistant  to ceftriaxone. She was treated at the time with intravenous imipenem and  she responded nicely. Two subsequent urinary cultures were positive for  vancomycin-resistant enterococcus. It was felt at the time that this was a  contaminant and she was not treated for this. At the skilled nursing  facility, culture of her left heel revealed methicillin-resistant  Staphylococcus aureus. The patient was under treatment at the skilled  nursing facility with nitrofurantoin and Bactrim. Currently, Sylvia Medina  denies any pain; denies any nausea, vomiting, abdominal pain; denies any  dysuria or back pain.   PAST MEDICAL HISTORY:  1.  Diabetes mellitus.  2.  Cerebrovascular accident.  3.  Depression.  4.  Dementia.  5.  Status post PEG tube placement, September 28, 2004.  6.  Anemia.  7.  Hypothyroidism.  8.  Gout.   MEDICATIONS:  1.   Lexapro 10 mg by mouth per PEG tube daily.  2.  Prevacid 30 mg per tube daily.  3.  Seroquel 25 mg per tube in the morning and 50 mg per tube in the      evening.  4.  Aricept 10 mg per tube at bedtime.  5.  ProMod one scoop per PEG tube three times daily.  6.  Dulcolax per rectum every night at bedtime.  7.  Lantus 10 units subcutaneously at bedtime.  8.  NovoLog sliding scale q.6h.  9.  Jevity 1.2 390 mL per tube q.4h.  10. Free water boluses of 120 mL.  11. Nitrofurantoin 50 mg per PEG tube every day.  12. Vicodin 5/500 q.6h. p.r.n. for pain.  13. Septra double strength b.i.d. for 10 days that was started on November 15, 2004.   SOCIAL HISTORY:  The patient is currently retired. She lives in a skilled  nursing facility. She is a former tobacco user but does not smoke currently.  She never drank alcohol. She is widowed. She has a loving daughter  at the  bedside. She has a Do Not Resuscitate prior arrangements.   FAMILY HISTORY:  Positive for diabetes and hypertension and strokes.   REVIEW OF SYSTEMS:  As per HPI plus the patient reports having episodes of  diarrhea on Wednesday. All other systems are negative.   PHYSICAL EXAMINATION:  VITAL SIGNS:  Temperature 95.1, blood pressure  127/67, heart rate 76, respirations 18, saturation 97% on room air.  GENERAL APPEARANCE:  The patient appears sort of cachectic from waist up and  very edematous from waist down, sitting in bed, alert and conversant, in no  apparent acute distress.  HEENT:  Head is normocephalic, atraumatic. Eyes show pupils equal and round,  react to light and accommodation. Extraocular movements intact. The patient  is edentulous. Throat is clear.  NECK:  Supple without JVD. No carotid bruits.  LUNGS:  Clear to auscultation bilaterally without wheezing, rhonchi, or  crackles.  HEART:  Sounds regular rate and rhythm without murmurs, rubs, or gallops.  ABDOMEN:  Soft. Bowel sounds are present. There is a  gastrostomy tube in  place. There is no area of erythema or tenderness around the tube. There is  no suprapubic tenderness. There is no palpable hepatosplenomegaly.  SKIN:  Cold and dry. There is a decubitus ulcer on the left heel and there  is a decubitus ulcer on the right heel.  NEUROLOGIC:  She has 4/5 strength in right upper extremity and 5/5 strength  in left upper extremity. She has 3/5 strength in lower extremities  bilaterally.   LABORATORY VALUES AT TIME OF ADMISSION:  White blood cell count 6000,  hemoglobin 9.8, hematocrit 30.3, platelet count 344. Sodium 138, potassium  4.2, chloride 104, bicarb 24, BUN 48, creatinine 1.9, glucose 77, AST 54,  ALT 55, alkaline phosphatase 202, albumin 3. Urinalysis positive for  leukocyte esterase, positive for nitrites. White blood cells too numerous to  count. CT scan of the head shows no acute changes, no acute hemorrhage. BNP  is less than 30. EKG shows normal sinus rhythm and nonspecific ST  abnormalities.   ASSESSMENT AND PLAN:  1.  Sepsis with hypotension and hypothermia. Suspect urinary source versus      bacteremia secondary to the open decubitus. The plan is to admit the      patient to a stepdown unit, proceed with aggressive intravenous fluid      hydration, and broad-spectrum antibiotic using vancomycin and imipenem.      Blood cultures and urine cultures were already obtained in the emergency      room. Given the fact that the patient also had an episode of diarrhea,      we will also check the stool for Clostridium difficile toxin.  2.  Dementia and depression. We will continue this patient on Seroquel,      Aricept, and Lexapro.  3.  Diabetes. CBGs will be checked every 6 hours and NovoLog sliding scale      will be given. Also, Lantus will be administered.  4.  Anemia with recent fecal occult blood positive stools. Will hemoccult     all stools and monitor the CBC. If bleeding is observed, we will proceed      with GI  evaluation, possible EGD versus colonoscopy. GI prophylaxis will      be undertaken with Protonix.      Lonia Blood, M.D.  Electronically Signed     SL/MEDQ  D:  11/26/2004  T:  11/26/2004  Job:  [id number]  cc:   Maxwell Caul, M.D.  Fax: [phone]

## 2010-07-02 NOTE — H&P (Signed)
Sylvia Medina, Sylvia Medina              ACCOUNT NO.:  [id number]   MEDICAL RECORD NO.:  [id number]          PATIENT TYPE:  INP   LOCATION:  3316                         FACILITY:  [facility]   PHYSICIAN:  Jonna L. Robb Matar, M.D.DATE OF BIRTH:  04/30/1935   DATE OF ADMISSION:  09/15/2004  DATE OF DISCHARGE:                                HISTORY & PHYSICAL   PRIMARY CARE PHYSICIAN:  Medical laboratory scientific officer.   CODE STATUS:  DNR.   CHIEF COMPLAINT:  Falling, confused.   HISTORY OF PRESENT ILLNESS:  This 75 year old African-American female was  admitted here in May of this year with an intracerebral hemorrhage,  hydrocephalus necessitating a ventriculostomy by Tia Alert, M.D.,  severe dysphagia.  She made significant and gradual improvement from that  point and was released to the nursing home.  The family had not quite  decided what they wanted to do regarding tube feeding, since the patient had  improved to the point where she could take honey-thickened liquids without  much difficulty.  However, today she fell at around 4 a.m. and when they saw  her later in the day she was combative, confused.  Her daughter notes that  she had more trouble swallowing applesauce today than she usually does, that  her right arm was weak, and that her right face was twisted and she does not  recall these things prior to this day.   ALLERGIES:  No known drug allergies.   MEDICATIONS:  1.  Zoloft 50 daily.  2.  Pepcid 20 b.i.d.  3.  Iron 325 t.i.d.  4.  Nitrobid ointment 1 inch every 6 hours.  5.  Metoprolol 50 mg q.6 hours.  6.  Remeron 15 mg at h.s.  7.  Sliding scale insulin.  8.  Lantus 6 units b.i.d.  9.  Ensure three times a day.   PAST MEDICAL HISTORY:  1.  Intracerebral hemorrhage with following dementia.  2.  Type 2 diabetes.  3.  Hypertension.  4.  Gout.  5.  Dysphagia.  She had problems with aspiration during her previous      admission.   FAMILY HISTORY:  Hypertension, diabetes,  stroke.   SOCIAL HISTORY:  Ex-smoker, nondrinker.  Widowed.   REVIEW OF SYSTEMS:  Per the patient's daughter.  She does not think she has  had a fever or visual problems.  No coughing or breathing difficulty.  No  nausea, vomiting, or diarrhea.  No history of thyroid problems.  The  patient's daughter notes that she has been having an ulcer on her right  heel.  No history of seizures.   PHYSICAL EXAMINATION:  VITAL SIGNS:  Temperature 91 and had come up to 92.3,  pulse 68, respirations 14, blood pressure as low as 87/40, but presently was  up to 102/62.  GENERAL:  She is a quiet, elderly, African-American female.  She was alert  and arousable.  Responded to her daughter.  HEENT:  Conjunctivae and lids were normal.  Pupils were reactive.  I could  not test her extraocular movements.  Mucosa was normal.  She is edentulous.  NECK:  No mass, thyromegaly, or carotid bruits.  LUNGS:  Respiratory effort seemed normal.  Lungs were clear without  wheezing, rales, rhonchi, or dullness.  HEART:  Regular rate and rhythm, normal S1 and S2 without murmurs, rubs, or  gallops.  There was no cyanosis, clubbing, or edema.  BREASTS:  No masses, tenderness, or discharge.  ABDOMEN:  Nontender.  She had no hepatosplenomegaly.  The patient was  somewhat twisted, so the examination was suboptimal.  GENITOURINARY:  External genitalia was normal.  EXTREMITIES:  The patient had right upper extremity weakness.  Right central  facial.  She has an obesity pattern with very large below the waist and  upper thighs.  Her daughter says is worse as she got older and put on more  weight, but that is the family pattern.  Her right toe goes up.  She has a  right heel ulcer.   LABORATORY DATA:  Chemistries were normal except for BUN of 28, creatinine  2.0. Urinalysis was positive for both nitrite, esterase, and pyuria.  Urine  culture is pending.  CT of the head pending.  CBC pending.   IMPRESSION:  1.  Septic shock  with hypotension, hypothermia.  I am going to put the      patient on some antibiotics and check her cultures.  2.  Pyelonephritis.  3.  Left cerebrovascular accident.  I am going to get a CAT scan and see if      she has had a new CVA.  4.  Type 2 diabetes.  5.  Dysphagia.  I am going to keep her NPO for a couple of days until her      sepsis clears.      Jonna L. Robb Matar, M.D.  Electronically Signed     JLB/MEDQ  D:  09/15/2004  T:  09/15/2004  Job:  [id number]   cc:   [facility]

## 2010-07-02 NOTE — Discharge Summary (Signed)
NAMEGEANIE, Sylvia Medina              ACCOUNT NO.:  [id number]   MEDICAL RECORD NO.:  [id number]          PATIENT TYPE:  INP   LOCATION:  3316                         FACILITY:  [facility]   PHYSICIAN:  Hillery Aldo, M.D.   DATE OF BIRTH:  Jul 28, 1935   DATE OF ADMISSION:  09/15/2004  DATE OF DISCHARGE:                                 DISCHARGE SUMMARY   INTERIM DISCHARGE SUMMARY   DISCHARGE DIAGNOSES:  1.  Sepsis.  2.  Hypotension.  3.  Hypothermia.  4.  Pyelonephritis.  5.  Acute renal failure secondary to acute tubular necrosis.  6.  Anemia.  7.  Hypothyroidism.  8.  Dysphagia.  9.  Type 2 diabetes.  10. Hypokalemia.  11. Decubitus ulcer to the right heel and sacrum.  12. Dementia.  13. History of gout.   DISCHARGE MEDICATIONS:  To be determined at the time of actual discharge.   CONSULTATIONS:  None.   PROCEDURES AND DIAGNOSTIC STUDIES:  1.  Chest x-ray on September 15, 2004 showed no evidence of active infiltrate or      edema.  2.  CT scan on September 15, 2004 of the head showed small hyperdense      subcentimeter lesion in the roof of the fourth ventricle on the left      thought to be a residual density from previous vermian bleed although a      small subependymoma could not be excluded.  Atrophy and small vessel      disease was again noted.  No intracranial hemorrhage and extra axial      fluid collection.  No skull fracture.  3.  Abdominal x-ray on September 16, 2004:  Feeding tube is in place with a tip      in the fundus of the stomach, normal bowel gas pattern.  4.  MRI of the brain on September 16, 2004 showed no acute infarct.  5.  Chest x-ray on September 17, 2004 showed no evidence of acute disease.  6.  Chest x-ray on September 19, 2004 showed cardiomegaly without congestive      heart failure and interval development of bibasilar atelectasis.  7.  Renal ultrasound on September 20, 2004 showed normal kidney size without      obstruction.  8.  Chest x-ray on September 20, 2004 showed  the PICC line in the tip of the      right atrium and again no noted bibasilar atelectasis.   BRIEF HISTORY OF PRESENT ILLNESS:  The patient is a 75 year old female, who  originally was admitted back in May with an intracerebral hemorrhage and  hydrocephalus necessitating a ventriculostomy by Dr. Yetta Barre.  She  subsequently had severe dysphagia postoperatively.  She made gradual  improvement and was released to a nursing home on a dysphagia honey-  thickened liquid diet.  Shortly after her discharge from that  hospitalization, she was readmitted secondary to a fall.  She was described  as being combative and confused by family members.  She was admitted for  further evaluation and workup.   HOSPITAL COURSE:  Problem 1. Hypotension.  The  patient's hypotension was  thought to be secondary to an infectious etiology, although a neurologic  insult could not be excluded.  She was admitted and pancultures revealed a  potential urinary source with evidence of a urinary tract infection.  She  was empirically started on Zosyn.  For several days, she was hypotensive,  but responded well to IV fluid boluses.  Although she never required the  support of pressors, a discussion was held with her daughter, who wanted to  pursue aggressive management.  CT scanning and subsequent MRI scanning of  the brain did not reveal any new neurologic insult.  Her cultures on September 21, 2004 revealed greater than 100,000 colonies of Klebsiella pneumoniae.  Her antibiotics were then changed from Zosyn to Primaxin based on  sensitivities.  Although she remained hemodynamically stable, she did  require the use of a heating blanket intermittently to keep her temperature  up.  At the time of this dictation, she has no further evidence of ongoing  sepsis or multi organ dysfunction.   Problem 2. Acute renal failure.  The patient's creatinine reached a high of  2.0.  A FENa was inevitably checked and found to be 3.1.  This is  consistent  with acute tubular necrosis secondary to hypotension.  A renal ultrasound  was checked to rule out post renal causes given her occasional decrease in  urine output.  Her Foley was changed as well.  Despite a decrease in urine  output, her creatinine trended down over the course of her hospitalization  and at the time of this dictation is at 1.9.   Problem 3. Anemia.  The patient's ferritin was checked and found to be  elevated.  This rules out iron deficiency.  It is likely that she has anemia  of chronic disease.   Problem 4. Hypothyroidism.  A free T4 was found to be low in the setting of  a normal TSH.  This will be repeated and if it continues to be abnormal, she  should be supplemented appropriately.   Problem 5. Dysphagia.  Multiple attempts to assess her swallowing function  failed secondary to her inability to participate.  At the time of this  dictation, speech therapy did recommend placement of a PEG if the family is  agreeable.  At this time, she remains on tube feedings via a Panda tube.   Problem 6. Diabetes.  The patient's diabetes was well controlled with  sliding scale insulin alone.   Problem 7. Hypokalemia.  The patient was appropriately replaced.   Problem 8. Decubitus ulcers to the right heel and sacral.  The patient was  seen by nursing wound coordinator, who made recommendations which were  followed through on.  Additionally, the patient had her heels lifted off the  bed with pillows at all times.  She was frequently turned and positioned to  minimize any further breakdown of these areas.   Problem 9. Disposition.  A long discussion was held with the patient's  daughter and with the social worker given her overall prognosis.  The  daughter had recently had many losses including the recent death of her  father and a death just the day prior to our discussion of one of her aunts.  She was, therefore, very distraught at the idea that she may be losing  her mother.  She was reminded that the patient wished to be a no code blue.  The  patient's daughter agreed with that, however, wanted very aggressive  support  instituted to reverse her current state of illness.  She reports that the  patient had a fairly good functional status and good quality of life prior  to this insult.  Given this, we will attempt  to treat these underlying medical problems in the hopes that we can return  her to her former state of function.  If she continues to decline, however,  a repeat discussion should be held with pertinent family members to change  the goal of treatment to comfort.       CR/MEDQ  D:  09/21/2004  T:  09/22/2004  Job:  [id number]   cc:   [facility]

## 2010-12-01 LAB — CBC
HCT: 28.8 — ABNORMAL LOW
HCT: 28.9 — ABNORMAL LOW
HCT: 28.9 — ABNORMAL LOW
HCT: 29.8 — ABNORMAL LOW
HCT: 30.2 — ABNORMAL LOW
Hemoglobin: 8.3 — ABNORMAL LOW
Hemoglobin: 8.9 — ABNORMAL LOW
Hemoglobin: 9.1 — ABNORMAL LOW
Hemoglobin: 9.2 — ABNORMAL LOW
Hemoglobin: 9.3 — ABNORMAL LOW
MCHC: 30.9
MCHC: 31.7
MCHC: 31.8
MCHC: 31.9
MCV: 68.7 — ABNORMAL LOW
MCV: 68.8 — ABNORMAL LOW
MCV: 69.3 — ABNORMAL LOW
MCV: 70 — ABNORMAL LOW
MCV: 70.9 — ABNORMAL LOW
MCV: 71.4 — ABNORMAL LOW
Platelets: 465 — ABNORMAL HIGH
Platelets: 496 — ABNORMAL HIGH
Platelets: 504 — ABNORMAL HIGH
Platelets: 505 — ABNORMAL HIGH
Platelets: 541 — ABNORMAL HIGH
Platelets: 541 — ABNORMAL HIGH
RBC: 4.08
RBC: 4.13
RBC: 4.15
RBC: 4.21
RBC: 4.25
RBC: 4.39
RBC: 4.42
RDW: 23.5 — ABNORMAL HIGH
RDW: 24 — ABNORMAL HIGH
RDW: 24.7 — ABNORMAL HIGH
RDW: 24.7 — ABNORMAL HIGH
RDW: 25 — ABNORMAL HIGH
WBC: 10
WBC: 11 — ABNORMAL HIGH
WBC: 14 — ABNORMAL HIGH
WBC: 6.7
WBC: 9.1
WBC: 9.1
WBC: 9.4
WBC: 9.8
WBC: 9.8

## 2010-12-01 LAB — BASIC METABOLIC PANEL
BUN: 12
BUN: 14
BUN: 18
BUN: 21
BUN: 31 — ABNORMAL HIGH
BUN: 37 — ABNORMAL HIGH
BUN: 43 — ABNORMAL HIGH
CO2: 22
CO2: 26
CO2: 26
CO2: 28
CO2: 29
Calcium: 8.8
Calcium: 8.8
Calcium: 9.1
Calcium: 9.2
Calcium: 9.2
Calcium: 9.4
Chloride: 101
Chloride: 104
Chloride: 106
Chloride: 106
Chloride: 106
Chloride: 109
Chloride: 109
Creatinine, Ser: 0.92
Creatinine, Ser: 0.96
Creatinine, Ser: 1.06
Creatinine, Ser: 1.08
Creatinine, Ser: 1.09
Creatinine, Ser: 1.11
Creatinine, Ser: 1.15
GFR calc Af Amer: 57 — ABNORMAL LOW
GFR calc Af Amer: 59 — ABNORMAL LOW
GFR calc Af Amer: 60 — ABNORMAL LOW
GFR calc Af Amer: >60
GFR calc Af Amer: >60
GFR calc Af Amer: >60
GFR calc Af Amer: >60
GFR calc non Af Amer: 47 — ABNORMAL LOW
GFR calc non Af Amer: 49 — ABNORMAL LOW
GFR calc non Af Amer: 50 — ABNORMAL LOW
GFR calc non Af Amer: 50 — ABNORMAL LOW
GFR calc non Af Amer: 52 — ABNORMAL LOW
GFR calc non Af Amer: >60
Glucose, Bld: 132 — ABNORMAL HIGH
Glucose, Bld: 74
Glucose, Bld: 81
Glucose, Bld: 83
Glucose, Bld: 93
Potassium: 3.9
Potassium: 4
Potassium: 4.2
Potassium: 4.2
Potassium: 4.4
Potassium: 4.4
Potassium: 4.8
Sodium: 134 — ABNORMAL LOW
Sodium: 139
Sodium: 140
Sodium: 140
Sodium: 141
Sodium: 141
Sodium: 142

## 2010-12-01 LAB — CROSSMATCH
ABO/RH(D): A POS
Antibody Screen: NEGATIVE
Antibody Screen: NEGATIVE

## 2010-12-01 LAB — HEMOGLOBIN AND HEMATOCRIT, BLOOD
HCT: 22.2 — ABNORMAL LOW
HCT: 26.8 — ABNORMAL LOW
HCT: 27.8 — ABNORMAL LOW
HCT: 28 — ABNORMAL LOW
HCT: 28.2 — ABNORMAL LOW
HCT: 29.6 — ABNORMAL LOW
HCT: 30.6 — ABNORMAL LOW
HCT: 30.6 — ABNORMAL LOW
HCT: 31.3 — ABNORMAL LOW
HCT: 32.2 — ABNORMAL LOW
Hemoglobin: 8.6 — ABNORMAL LOW
Hemoglobin: 8.7 — ABNORMAL LOW
Hemoglobin: 9.1 — ABNORMAL LOW
Hemoglobin: 9.3 — ABNORMAL LOW
Hemoglobin: 9.3 — ABNORMAL LOW
Hemoglobin: 9.5 — ABNORMAL LOW
Hemoglobin: 9.5 — ABNORMAL LOW
Hemoglobin: 9.8 — ABNORMAL LOW
Hemoglobin: 9.9 — ABNORMAL LOW

## 2010-12-01 LAB — IRON AND TIBC
Iron: <10 — ABNORMAL LOW
UIBC: 273

## 2010-12-01 LAB — URINE CULTURE: Colony Count: >=100000

## 2010-12-01 LAB — ABO/RH: ABO/RH(D): A POS

## 2010-12-01 LAB — VITAMIN B12: Vitamin B-12: 1516 — ABNORMAL HIGH (ref 211–911)

## 2010-12-01 LAB — FERRITIN: Ferritin: 6 — ABNORMAL LOW (ref 10–291)

## 2010-12-01 LAB — HEMOGLOBIN A1C: Hgb A1c MFr Bld: 5.7

## 2010-12-01 LAB — APTT: aPTT: 32

## 2010-12-01 LAB — RETICULOCYTES: Retic Count, Absolute: 70.6

## 2010-12-01 LAB — PROTIME-INR: INR: 1.1

## 2012-07-04 ENCOUNTER — Non-Acute Institutional Stay (SKILLED_NURSING_FACILITY): Payer: 59 | Admitting: Nurse Practitioner

## 2012-07-04 ENCOUNTER — Encounter: Payer: Self-pay | Admitting: Nurse Practitioner

## 2012-07-04 DIAGNOSIS — E119 Type 2 diabetes mellitus without complications: Secondary | ICD-10-CM

## 2012-07-04 DIAGNOSIS — F0390 Unspecified dementia without behavioral disturbance: Secondary | ICD-10-CM | POA: Insufficient documentation

## 2012-07-04 DIAGNOSIS — E039 Hypothyroidism, unspecified: Secondary | ICD-10-CM | POA: Insufficient documentation

## 2012-07-04 DIAGNOSIS — I10 Essential (primary) hypertension: Secondary | ICD-10-CM | POA: Insufficient documentation

## 2012-07-04 DIAGNOSIS — D649 Anemia, unspecified: Secondary | ICD-10-CM

## 2012-07-04 DIAGNOSIS — I499 Cardiac arrhythmia, unspecified: Secondary | ICD-10-CM

## 2012-07-04 DIAGNOSIS — I639 Cerebral infarction, unspecified: Secondary | ICD-10-CM

## 2012-07-04 DIAGNOSIS — E559 Vitamin D deficiency, unspecified: Secondary | ICD-10-CM

## 2012-07-04 DIAGNOSIS — B029 Zoster without complications: Secondary | ICD-10-CM

## 2012-07-04 DIAGNOSIS — K5900 Constipation, unspecified: Secondary | ICD-10-CM

## 2012-07-04 DIAGNOSIS — F329 Major depressive disorder, single episode, unspecified: Secondary | ICD-10-CM

## 2012-07-04 DIAGNOSIS — I6350 Cerebral infarction due to unspecified occlusion or stenosis of unspecified cerebral artery: Secondary | ICD-10-CM

## 2012-07-04 HISTORY — DX: Type 2 diabetes mellitus without complications: E11.9

## 2012-07-04 HISTORY — DX: Anemia, unspecified: D64.9

## 2012-07-04 HISTORY — DX: Cardiac arrhythmia, unspecified: I49.9

## 2012-07-04 HISTORY — DX: Vitamin D deficiency, unspecified: E55.9

## 2012-07-04 HISTORY — DX: Unspecified dementia, unspecified severity, without behavioral disturbance, psychotic disturbance, mood disturbance, and anxiety: F03.90

## 2012-07-04 HISTORY — DX: Constipation, unspecified: K59.00

## 2012-07-04 HISTORY — DX: Cerebral infarction, unspecified: I63.9

## 2012-07-04 MED ORDER — POLYETHYLENE GLYCOL 3350 17 GM/SCOOP PO POWD: 17.0000 g | Freq: Two times a day (BID) | ORAL | Status: DC | PRN

## 2012-07-04 MED ORDER — THERAPEUTIC MULTIVIT/MINERAL PO TABS: 1.0000 | ORAL_TABLET | Freq: Every day | ORAL | Status: DC

## 2012-07-04 MED ORDER — VALACYCLOVIR HCL 1 G PO TABS: 1000.0000 mg | ORAL_TABLET | Freq: Three times a day (TID) | ORAL | Status: DC

## 2012-07-04 MED ORDER — VITAMIN D 1000 UNITS PO CAPS: 1000.0000 [IU] | ORAL_CAPSULE | Freq: Every day | ORAL | Status: DC

## 2012-07-04 MED ORDER — RIVASTIGMINE 13.3 MG/24HR TD PT24: 1.0000 | MEDICATED_PATCH | Freq: Every day | TRANSDERMAL | Status: DC

## 2012-07-04 MED ORDER — VALACYCLOVIR HCL 1 G PO TABS: 1000.0000 mg | ORAL_TABLET | Freq: Three times a day (TID) | ORAL | Status: AC

## 2012-07-04 MED ORDER — METOPROLOL TARTRATE 25 MG PO TABS: 12.5000 mg | ORAL_TABLET | Freq: Every day | ORAL | Status: DC

## 2012-07-04 NOTE — Assessment & Plan Note (Signed)
Stable on Exelon patch. Being seen by [facility] psych service.

## 2012-07-04 NOTE — Assessment & Plan Note (Addendum)
Heart rate has been 100 - 110. She denies chest pain. SBP also elevated with weekly b/p, systolic readings 140 - 170.  1) Will start metoprolol 25mg  po qd for tachycardia and htn. Will check b/p and hr qs x 3 days, then qd.

## 2012-07-04 NOTE — Assessment & Plan Note (Signed)
Stable, takes Miralax qd.

## 2012-07-04 NOTE — Assessment & Plan Note (Signed)
Monthly cbg's = 97 on 06/21/12. Not on any meds, diet controlled. Last hgb a1c 08/04/11 = 5.1.

## 2012-07-04 NOTE — Assessment & Plan Note (Signed)
TSH 04/04/12 = 0.215 L. Not on Thyroid replacement. Will recheck TSH next lab day.

## 2012-07-04 NOTE — Assessment & Plan Note (Addendum)
Taking multivitamin. HGB stable at 12.0 05/22/12.

## 2012-07-04 NOTE — Assessment & Plan Note (Signed)
Stable, takes vitamin d3 1000 u po qd. 05/22/12 level stable at = 45.

## 2012-07-04 NOTE — Assessment & Plan Note (Signed)
Has generalized weakness. Not on aspirin due to Hemorrhagic CVA.

## 2012-07-04 NOTE — Assessment & Plan Note (Signed)
Was taking Lexapro it has been dc'd due to pt's stable mood.

## 2012-07-04 NOTE — Progress Notes (Signed)
Subjective:    Patient ID: Sylvia Medina, female    DOB: [birth year], 77 y.o.   MRN: [id number]  HPI Comments: Saw pt today for eval of rash and medical management of chronic illnesses. She is bed bound, rarely getting out of bed. She does cover her head up most of the time with sheets. She has hx of cardiac arrhythmia, and hypothyroidism (previous level was low, not on thyroid supplement), hr > 100, and sbp 140 - 180. She denies sx.   Rash This is a new problem. The current episode started today. The problem is unchanged. The affected locations include the chest, right axilla, right arm, right shoulder, right wrist and right hand. The rash is characterized by blistering and redness. She was exposed to nothing. Pertinent negatives include no eye pain, facial edema, fever, nail changes or shortness of breath. Past treatments include nothing. Her past medical history is significant for varicella.      Review of Systems  Constitutional: Negative.  Negative for fever.  Eyes: Negative for pain.  Respiratory: Negative for shortness of breath.   Cardiovascular: Negative.   Gastrointestinal: Negative.   Endocrine: Negative.   Musculoskeletal: Negative.   Skin: Positive for rash. Negative for nail changes.  Allergic/Immunologic: Negative.   Neurological: Negative.   Hematological: Negative.   Psychiatric/Behavioral: Negative.        Objective:   Physical Exam  Constitutional: She is oriented to person, place, and time. She appears well-developed and well-nourished. No distress.  HENT:  Head: Normocephalic.  Eyes: Pupils are equal, round, and reactive to light. Right eye exhibits no discharge. Left eye exhibits no discharge.  Neck: No tracheal deviation present. No thyromegaly present.  Cardiovascular: Regular rhythm.  Tachycardia present.   Pulmonary/Chest: Effort normal and breath sounds normal. No respiratory distress.  Abdominal: Soft. Normal appearance and bowel sounds are normal.  There is no tenderness.  Musculoskeletal: Normal range of motion.  Neurological: She is alert and oriented to person, place, and time. She has normal strength. No cranial nerve deficit or sensory deficit. Gait abnormal.  Skin: Skin is warm and dry. Rash noted. Rash is vesicular. She is not diaphoretic. No cyanosis. Nails show no clubbing.  Psychiatric: She has a normal mood and affect.      Assessment & Plan:  1) start metoprolol 12.5mg  po qd for htn/tachcardia. 2) Valtrex 1000mg  po q 8 hrs x 7 days for herpes zoster. 3) hgb a1c, tsh, cbc, bmp in am. 4) check b/p and hr qs x 3 days the qd.  Diabetes mellitus type 2, diet-controlled Monthly cbg's = 97 on 06/21/12. Not on any meds, diet controlled. Last hgb a1c 08/04/11 = 5.1.  Unspecified hypothyroidism TSH 04/04/12 = 0.215 L. Not on Thyroid replacement. Will recheck TSH next lab day.  Unspecified vitamin D deficiency Stable, takes vitamin d3 1000 u po qd. 05/22/12 level stable at = 45.  Anemia Taking multivitamin. HGB stable at 12.0 05/22/12.  Dementia Stable on Exelon patch. Being seen by [facility] psych service.  Unspecified constipation Stable, takes Miralax qd.  Depression Was taking Lexapro it has been dc'd due to pt's stable mood.  CVA (cerebral vascular accident) Has generalized weakness. Not on aspirin due to Hemorrhagic CVA.  Cardiac arrhythmia Heart rate has been 100 - 110. She denies chest pain. SBP also elevated with weekly b/p, systolic readings 140 - 170.  1) Will start metoprolol 25mg  po qd for tachycardia and htn. Will check b/p and hr qs x 3 days, then  qd.

## 2012-09-19 ENCOUNTER — Non-Acute Institutional Stay (SKILLED_NURSING_FACILITY): Payer: 59 | Admitting: Adult Health

## 2012-09-19 ENCOUNTER — Encounter: Payer: Self-pay | Admitting: Adult Health

## 2012-09-19 DIAGNOSIS — K5900 Constipation, unspecified: Secondary | ICD-10-CM

## 2012-09-19 DIAGNOSIS — E559 Vitamin D deficiency, unspecified: Secondary | ICD-10-CM

## 2012-09-19 DIAGNOSIS — I10 Essential (primary) hypertension: Secondary | ICD-10-CM

## 2012-09-19 DIAGNOSIS — F0390 Unspecified dementia without behavioral disturbance: Secondary | ICD-10-CM

## 2012-09-19 NOTE — Assessment & Plan Note (Signed)
Will continue miralax daily and will monitor

## 2012-09-19 NOTE — Assessment & Plan Note (Addendum)
Her vit d level is 45 will stop the vit d at this time.

## 2012-09-19 NOTE — Assessment & Plan Note (Signed)
Her blood pressure is stable will continue lopressor 12.5 mg twice daily and will continue to monitor her status

## 2012-09-19 NOTE — Assessment & Plan Note (Signed)
Is without change in status will continue exelon patch 13.3 mg daily and will continue to monitor her status she does remain in bed per her choice

## 2012-09-19 NOTE — Progress Notes (Signed)
Patient ID: Sylvia Medina, female   DOB: 04-Apr-1935, 77 y.o.   MRN: [id number]  [facility]  No Known Allergies   Chief Complaint  Patient presents with  . Medical Managment of Chronic Issues    HPI: She is being seen for the management of her chronic illnesses. There are no concerns being voiced by the nursing staff at this time. She is not voicing any complaints stating that she is feeling good.   Past Medical History  Diagnosis Date  . Dementia 07/04/2012  . Anemia 07/04/2012  . Unspecified constipation 07/04/2012  . Unspecified vitamin D deficiency 07/04/2012  . Diabetes mellitus type 2, diet-controlled 07/04/2012  . CVA (cerebral vascular accident) 07/04/2012  . Unspecified hypothyroidism 07/04/2012  . Cardiac arrhythmia 07/04/2012    No past surgical history on file.  VITAL SIGNS BP 144/67  Pulse 61  Ht 5\' 5"  (1.651 m)  Wt 199 lb 4.8 oz (90.402 kg)  BMI 33.17 kg/m2   Patient's Medications  New Prescriptions   No medications on file  Previous Medications   CHOLECALCIFEROL (VITAMIN D) 1000 UNITS CAPSULE    Take 1 capsule (1,000 Units total) by mouth daily.   METOPROLOL TARTRATE (LOPRESSOR) 25 MG TABLET    Take 0.5 tablets (12.5 mg total) by mouth daily.   RIVASTIGMINE (EXELON) 13.3 MG/24HR PT24    Place 1 patch (13.3 mg total) onto the skin daily.   THERAPEUTIC MULTIVITAMIN-MINERALS (THERAGRAN-M) TABLET    Take 1 tablet by mouth daily.  Modified Medications   Modified Medication Previous Medication   POLYETHYLENE GLYCOL POWDER (GLYCOLAX/MIRALAX) POWDER polyethylene glycol powder (GLYCOLAX/MIRALAX) powder      Take 17 g by mouth daily.    Take 17 g by mouth 2 (two) times daily as needed.  Discontinued Medications   No medications on file    SIGNIFICANT DIAGNOSTIC EXAMS   LABS REVIEWED 05-22-12: vit d 45 07-05-12: wbc 6.1; hgb 13.3; hct 42.1; mcv 82.2; plt 204; glucose 117; bun 34; creat 1.00; k+4.7 Na++141; tsh 1.366; hgb a1c 5.4     Review of Systems   Constitutional: Negative for malaise/fatigue.  Respiratory: Negative for cough.   Cardiovascular: Negative for chest pain.  Gastrointestinal: Negative for heartburn and abdominal pain.  Musculoskeletal: Negative for myalgias and joint pain.  Skin: Negative.   Neurological: Negative for headaches.  Psychiatric/Behavioral: Negative for depression. The patient does not have insomnia.     Physical Exam  Constitutional: She appears well-developed and well-nourished.  overweight  Neck: Neck supple. No JVD present. No thyromegaly present.  Cardiovascular: Normal rate and intact distal pulses.   Heart rate slightly irregular   Respiratory: Effort normal and breath sounds normal. No respiratory distress. She has no wheezes.  GI: Soft. Bowel sounds are normal. She exhibits no distension. There is no tenderness.  Musculoskeletal: She exhibits no edema.  Has full range of motion upper extremities; is able to raise lower extremities slightly against gravity.   Neurological: She is alert.  Skin: Skin is warm and dry.  Psychiatric: She has a normal mood and affect.       ASSESSMENT/ PLAN: Unspecified constipation Will continue miralax daily and will monitor  Dementia Is without change in status will continue exelon patch 13.3 mg daily and will continue to monitor her status she does remain in bed per her choice  HTN (hypertension) Her blood pressure is stable will continue lopressor 12.5 mg twice daily and will continue to monitor her status   Unspecified vitamin D deficiency  Her vit d level is 45 will stop the vit d at this time.

## 2013-02-04 ENCOUNTER — Non-Acute Institutional Stay (SKILLED_NURSING_FACILITY): Payer: 59 | Admitting: Adult Health

## 2013-02-04 DIAGNOSIS — F0390 Unspecified dementia without behavioral disturbance: Secondary | ICD-10-CM

## 2013-02-04 DIAGNOSIS — I639 Cerebral infarction, unspecified: Secondary | ICD-10-CM

## 2013-02-04 DIAGNOSIS — I6350 Cerebral infarction due to unspecified occlusion or stenosis of unspecified cerebral artery: Secondary | ICD-10-CM

## 2013-02-04 DIAGNOSIS — I10 Essential (primary) hypertension: Secondary | ICD-10-CM

## 2013-02-04 DIAGNOSIS — E039 Hypothyroidism, unspecified: Secondary | ICD-10-CM

## 2013-02-10 ENCOUNTER — Encounter: Payer: Self-pay | Admitting: Adult Health

## 2013-02-10 DIAGNOSIS — F0390 Unspecified dementia without behavioral disturbance: Secondary | ICD-10-CM | POA: Insufficient documentation

## 2013-02-10 NOTE — Progress Notes (Signed)
Patient ID: Sylvia Medina, female   DOB: Apr 14, 1935, 78 y.o.   MRN: [id number]     [facility]  No Known Allergies   Chief Complaint  Patient presents with  . Medical Managment of Chronic Issues    HPI:  She is being seen for the management of her chronic illnesses. There are no concerns being voiced by the nursing staff at this time. She is not voicing any complaints or concerns she states that she is feeling good. Overall her status remains unchanged.    Past Medical History  Diagnosis Date  . Dementia 07/04/2012  . Anemia 07/04/2012  . Unspecified constipation 07/04/2012  . Unspecified vitamin D deficiency 07/04/2012  . Diabetes mellitus type 2, diet-controlled 07/04/2012  . CVA (cerebral vascular accident) 07/04/2012  . Unspecified hypothyroidism 07/04/2012  . Cardiac arrhythmia 07/04/2012    No past surgical history on file.  VITAL SIGNS BP 132/75  Pulse 70  Ht 5\' 5"  (1.651 m)  Wt 213 lb (96.616 kg)  BMI 35.44 kg/m2   Patient's Medications  New Prescriptions   No medications on file  Previous Medications   METOPROLOL TARTRATE (LOPRESSOR) 25 MG TABLET    Take 0.5 tablets (12.5 mg total) by mouth daily.   POLYETHYLENE GLYCOL POWDER (GLYCOLAX/MIRALAX) POWDER    Take 17 g by mouth daily.   RIVASTIGMINE (EXELON) 13.3 MG/24HR PT24    Place 1 patch (13.3 mg total) onto the skin daily.   THERAPEUTIC MULTIVITAMIN-MINERALS (THERAGRAN-M) TABLET    Take 1 tablet by mouth daily.  Modified Medications   No medications on file  Discontinued Medications   No medications on file    SIGNIFICANT DIAGNOSTIC EXAMS   LABS REVIEWED 05-22-12: vit d 45 07-05-12: wbc 6.1; hgb 13.3; hct 42.1; mcv 82.2; plt 204; glucose 117; bun 34; creat 1.00; k+4.7 Na++141; tsh 1.366; hgb a1c 5.4  11-28-12: wbc 5.4; hgb 11.7; hct 37.4; mcv 84.6.; plt 245; glucose 84; bun 27; creat 0.7; k+4.1; na++145; vit d 41.31     Review of Systems  Constitutional: Negative for malaise/fatigue.  Respiratory:  Negative for cough.   Cardiovascular: Negative for chest pain.  Gastrointestinal: Negative for heartburn and abdominal pain.  Musculoskeletal: Negative for myalgias and joint pain.  Skin: Negative.   Neurological: Negative for headaches.  Psychiatric/Behavioral: Negative for depression. The patient does not have insomnia.     Physical Exam  Constitutional: She appears well-developed and well-nourished.  overweight  Neck: Neck supple. No JVD present. No thyromegaly present.  Cardiovascular: Normal rate and intact distal pulses.   Heart rate slightly irregular   Respiratory: Effort normal and breath sounds normal. No respiratory distress. She has no wheezes.  GI: Soft. Bowel sounds are normal. She exhibits no distension. There is no tenderness.  Musculoskeletal: She exhibits no edema.  Has full range of motion upper extremities; is able to raise lower extremities slightly against gravity.   Neurological: She is alert.  Skin: Skin is warm and dry.  Psychiatric: She has a normal mood and affect.       ASSESSMENT/ PLAN: Unspecified constipation Will continue miralax daily and will monitor  Dementia Is without change in status will continue exelon patch 13.3 mg daily and will continue to monitor her status she does remain in bed per her choice  HTN (hypertension) Her blood pressure is stable will continue lopressor 12.5 mg  daily and will continue to monitor her status

## 2013-03-07 ENCOUNTER — Non-Acute Institutional Stay (SKILLED_NURSING_FACILITY): Payer: 59 | Admitting: Internal Medicine

## 2013-03-07 ENCOUNTER — Encounter: Payer: Self-pay | Admitting: Internal Medicine

## 2013-03-07 DIAGNOSIS — D649 Anemia, unspecified: Secondary | ICD-10-CM

## 2013-03-07 DIAGNOSIS — I10 Essential (primary) hypertension: Secondary | ICD-10-CM

## 2013-03-07 DIAGNOSIS — E039 Hypothyroidism, unspecified: Secondary | ICD-10-CM

## 2013-03-07 DIAGNOSIS — K5900 Constipation, unspecified: Secondary | ICD-10-CM

## 2013-03-07 DIAGNOSIS — F0390 Unspecified dementia without behavioral disturbance: Secondary | ICD-10-CM

## 2013-03-07 NOTE — Progress Notes (Signed)
Patient ID: Sylvia Medina, female   DOB: August 10, 1935, 78 y.o.   MRN: [id number]    [facility]  Chief Complaint  Patient presents with  . Medical Managment of Chronic Issues   No Known Allergies  HPI 78 y/o female pt is here for LTC and seen for routine follow up visit. She is in no distress. She denies any complaints. She is in bed all the time. No falls reported. No skin concerns  Review of Systems  Constitutional: Negative for fever, chills, weight loss, malaise/fatigue and diaphoresis.  HENT: Negative for congestion, hearing loss and sore throat.   Eyes: Negative for blurred vision, double vision and discharge.  Respiratory: Negative for cough, sputum production, shortness of breath and wheezing.   Cardiovascular: Negative for chest pain, palpitations, orthopnea and leg swelling.  Gastrointestinal: Negative for heartburn, nausea, vomiting, abdominal pain Genitourinary: Negative for dysuria Musculoskeletal: Negative for back pain, falls, joint pain and myalgias.  Skin: Negative for itching and rash.  Neurological: Negative for dizziness, tingling, focal weakness and headaches.  Psychiatric/Behavioral: Negative for depression.The patient is not nervous/anxious.    Past Medical History  Diagnosis Date  . Dementia 07/04/2012  . Anemia 07/04/2012  . Unspecified constipation 07/04/2012  . Unspecified vitamin D deficiency 07/04/2012  . Diabetes mellitus type 2, diet-controlled 07/04/2012  . CVA (cerebral vascular accident) 07/04/2012  . Unspecified hypothyroidism 07/04/2012  . Cardiac arrhythmia 07/04/2012   Current Outpatient Prescriptions on File Prior to Visit  Medication Sig Dispense Refill  . metoprolol tartrate (LOPRESSOR) 25 MG tablet Take 0.5 tablets (12.5 mg total) by mouth daily.      . polyethylene glycol powder (GLYCOLAX/MIRALAX) powder Take 17 g by mouth daily.      . Rivastigmine (EXELON) 13.3 MG/24HR PT24 Place 1 patch (13.3 mg total) onto the skin daily.  30  patch    . therapeutic multivitamin-minerals (THERAGRAN-M) tablet Take 1 tablet by mouth daily.       No current facility-administered medications on file prior to visit.   Physical exam BP 116/76  Pulse 71  Temp(Src) 97.1 F (36.2 C)  Resp 18  SpO2 96%  Constitutional: She appears well-developed and well-nourished. overweight  Neck: Neck supple. No JVD present. No thyromegaly present.  Cardiovascular:   Heart rate irregular   Respiratory: Effort normal and breath sounds normal. No respiratory distress. She has no wheezes.  GI: Soft. Bowel sounds are normal. She exhibits no distension. There is no tenderness.  Musculoskeletal: Has full range of motion upper extremities. She is able to raise lower extremities slightly against gravity but has chronic lymphedema.   Neurological: She is alert and oriented to person, place Skin: Skin is warm and dry.  Psychiatric: She has a normal mood and affect.    Labs- 05-22-12: vit d 45 07-05-12: wbc 6.1; hgb 13.3; hct 42.1; mcv 82.2; plt 204; glucose 117; bun 34; creat 1.00; k+4.7 Na++141; tsh 1.366; hgb a1c 5.4   11-28-12: wbc 5.4; hgb 11.7; hct 37.4; mcv 84.6.; plt 245; glucose 84; bun 27; creat 0.7; k+4.1; na++145; vit d 41.31    Assessment/plan  HTN  Stable on lopressor 12.5 mg  daily. Reviewed bmp. Monitor bp readings  Mixed Dementia Persists. continue exelon patch 13.3 mg daily. Does not interact much with people, covers herself and is in bed. Continue skin care, vitamin supplement  Anemia Stable h/h, off all medication, monitor clinically  Hypothyroidism Reviewed tsh from may 2014. Off all medication currently. Recheck tsh  Unspecified constipation  continue miralax daily and monitor clinically. Encourage OOB daily   Code status: full code

## 2013-03-15 ENCOUNTER — Non-Acute Institutional Stay (SKILLED_NURSING_FACILITY): Payer: 59 | Admitting: Adult Health

## 2013-03-15 DIAGNOSIS — E039 Hypothyroidism, unspecified: Secondary | ICD-10-CM

## 2013-03-18 LAB — TSH: TSH: 0.25 u[IU]/mL — AB (ref 0.41–5.90)

## 2013-03-21 ENCOUNTER — Encounter: Payer: Self-pay | Admitting: Adult Health

## 2013-03-21 NOTE — Progress Notes (Signed)
Patient ID: Sylvia Medina, female   DOB: 1936-01-14, 78 y.o.   MRN: [id number]     .[facility]  No Known Allergies Chief Complaint  Patient presents with  . Acute Visit    follow up lab results     HPI:  Her tsh is low at 0.208. She is not thyroid replacement therapy. There are no concerns being voiced by the nursing staff at this time. She is not voicing any complaints at this time as well.   Past Medical History  Diagnosis Date  . Dementia 07/04/2012  . Anemia 07/04/2012  . Unspecified constipation 07/04/2012  . Unspecified vitamin D deficiency 07/04/2012  . Diabetes mellitus type 2, diet-controlled 07/04/2012  . CVA (cerebral vascular accident) 07/04/2012  . Unspecified hypothyroidism 07/04/2012  . Cardiac arrhythmia 07/04/2012    No past surgical history on file.  VITAL SIGNS BP 140/52  Pulse 54  Ht 5\' 5"  (1.651 m)  Wt 213 lb 1.4 oz (96.657 kg)  BMI 35.46 kg/m2   Patient's Medications  New Prescriptions   No medications on file  Previous Medications   METOPROLOL TARTRATE (LOPRESSOR) 25 MG TABLET    Take 0.5 tablets (12.5 mg total) by mouth daily.   POLYETHYLENE GLYCOL POWDER (GLYCOLAX/MIRALAX) POWDER    Take 17 g by mouth daily.   RIVASTIGMINE (EXELON) 13.3 MG/24HR PT24    Place 1 patch (13.3 mg total) onto the skin daily.   THERAPEUTIC MULTIVITAMIN-MINERALS (THERAGRAN-M) TABLET    Take 1 tablet by mouth daily.  Modified Medications   No medications on file  Discontinued Medications   No medications on file    SIGNIFICANT DIAGNOSTIC EXAMS  LABS REVIEWED 05-22-12: vit d 45 07-05-12: wbc 6.1; hgb 13.3; hct 42.1; mcv 82.2; plt 204; glucose 117; bun 34; creat 1.00; k+4.7 Na++141; tsh 1.366; hgb a1c 5.4  11-28-12: wbc 5.4; hgb 11.7; hct 37.4; mcv 84.6.; plt 245; glucose 84; bun 27; creat 0.7; k+4.1; na++145; vit d 41.31  03-08-13: tsh 0.208    Review of Systems  Constitutional: Negative for malaise/fatigue.  Respiratory: Negative for cough.   Cardiovascular:  Negative for chest pain.  Gastrointestinal: Negative for heartburn and abdominal pain.  Musculoskeletal: Negative for myalgias and joint pain.  Skin: Negative.   Neurological: Negative for headaches.  Psychiatric/Behavioral: Negative for depression. The patient does not have insomnia.     Physical Exam  Constitutional: She appears well-developed and well-nourished.  overweight  Neck: Neck supple. No JVD present. No thyromegaly present.  Cardiovascular: Normal rate and intact distal pulses.   Heart rate slightly irregular   Respiratory: Effort normal and breath sounds normal. No respiratory distress. She has no wheezes.  GI: Soft. Bowel sounds are normal. She exhibits no distension. There is no tenderness.  Musculoskeletal: She exhibits no edema.  Has full range of motion upper extremities; is able to raise lower extremities slightly against gravity.   Neurological: She is alert.  Skin: Skin is warm and dry.  Psychiatric: She has a normal mood and affect.     ASSESSMENT/ PLAN:  1. Hypothyroidism: her tsh is low; she is not on replacement therapy. Will check a free t3 and free t4 and will monitor her status.

## 2013-04-08 ENCOUNTER — Encounter: Payer: Self-pay | Admitting: Adult Health

## 2013-04-08 ENCOUNTER — Non-Acute Institutional Stay (SKILLED_NURSING_FACILITY): Payer: 59 | Admitting: Adult Health

## 2013-04-08 DIAGNOSIS — K5900 Constipation, unspecified: Secondary | ICD-10-CM

## 2013-04-08 DIAGNOSIS — I10 Essential (primary) hypertension: Secondary | ICD-10-CM

## 2013-04-08 DIAGNOSIS — E061 Subacute thyroiditis: Secondary | ICD-10-CM

## 2013-04-08 DIAGNOSIS — I639 Cerebral infarction, unspecified: Secondary | ICD-10-CM

## 2013-04-08 DIAGNOSIS — I6350 Cerebral infarction due to unspecified occlusion or stenosis of unspecified cerebral artery: Secondary | ICD-10-CM

## 2013-04-08 DIAGNOSIS — F0390 Unspecified dementia without behavioral disturbance: Secondary | ICD-10-CM

## 2013-04-08 LAB — T3, FREE
FREE T4: 0.93
T3 FREE: 2.75

## 2013-04-08 NOTE — Progress Notes (Signed)
Patient ID: Sylvia Medina, female   DOB: [birth year], 78 y.o.   MRN: [id number]     [facility]  No Known Allergies   Chief Complaint  Patient presents with  . Medical Managment of Chronic Issues    HPI:  She is being seen for the management of her chronic illnesses. Overall her status remains without change. There are no concerns being voiced by the nursing staff at this time. She is not voicing any concerns.    Past Medical History  Diagnosis Date  . Dementia 07/04/2012  . Anemia 07/04/2012  . Unspecified constipation 07/04/2012  . Unspecified vitamin D deficiency 07/04/2012  . Diabetes mellitus type 2, diet-controlled 07/04/2012  . CVA (cerebral vascular accident) 07/04/2012  . Unspecified hypothyroidism 07/04/2012  . Cardiac arrhythmia 07/04/2012    No past surgical history on file.  VITAL SIGNS BP 129/87  Pulse 80  Ht 5\' 5"  (1.651 m)  Wt 214 lb 1.6 oz (97.115 kg)  BMI 35.63 kg/m2   Patient's Medications  New Prescriptions   No medications on file  Previous Medications   METOPROLOL TARTRATE (LOPRESSOR) 25 MG TABLET    Take 0.5 tablets (12.5 mg total) by mouth daily.   POLYETHYLENE GLYCOL POWDER (GLYCOLAX/MIRALAX) POWDER    Take 17 g by mouth daily.   RIVASTIGMINE (EXELON) 13.3 MG/24HR PT24    Place 1 patch (13.3 mg total) onto the skin daily.   THERAPEUTIC MULTIVITAMIN-MINERALS (THERAGRAN-M) TABLET    Take 1 tablet by mouth daily.  Modified Medications   No medications on file  Discontinued Medications   No medications on file    SIGNIFICANT DIAGNOSTIC EXAMS  LABS REVIEWED  05-22-12: vit d 45 07-05-12: wbc 6.1; hgb 13.3; hct 42.1; mcv 82.2; plt 204; glucose 117; bun 34; creat 1.00; k+4.7 Na++141; tsh 1.366; hgb a1c 5.4  11-28-12: wbc 5.4; hgb 11.7; hct 37.4; mcv 84.6.; plt 245; glucose 84; bun 27; creat 0.7; k+4.1; na++145; vit d 41.31  03-08-13: tsh 0.208 03-18-13: tsh 0.246 03-25-13: free t3: 2.75; free t4: 0.93     Review of Systems  Constitutional:  Negative for malaise/fatigue.  Respiratory: Negative for cough.   Cardiovascular: Negative for chest pain.  Gastrointestinal: Negative for heartburn and abdominal pain.  Musculoskeletal: Negative for myalgias and joint pain.  Skin: Negative.   Neurological: Negative for headaches.  Psychiatric/Behavioral: Negative for depression. The patient does not have insomnia.     Physical Exam  Constitutional: She appears well-developed and well-nourished.  overweight  Neck: Neck supple. No JVD present. No thyromegaly present.  Cardiovascular: Normal rate and intact distal pulses.   Heart rate slightly irregular   Respiratory: Effort normal and breath sounds normal. No respiratory distress. She has no wheezes.  GI: Soft. Bowel sounds are normal. She exhibits no distension. There is no tenderness.  Musculoskeletal: She exhibits no edema.  Has full range of motion upper extremities; is able to raise lower extremities slightly against gravity.   Neurological: She is alert.  Skin: Skin is warm and dry.  Psychiatric: She has a normal mood and affect.     ASSESSMENT/ PLAN:  Unspecified constipation Will continue miralax daily and will monitor  Dementia Is without change in status will continue exelon patch 13.3 mg daily and will continue to monitor her status she does remain in bed per her choice  HTN (hypertension) Her blood pressure is stable will continue lopressor 12.5 mg  daily and will continue to monitor her status   Compensated thyroiditis Her thyroid  labs are stable; she is presently not on medications; will not make changes; will continue to monitor her labs over time and will further address as indicated.   CVA She is neurologically stable; is presently not on medications; will not make changes will continue to monitor her status.       Synthia Innocenteborah Gor Vestal NP [facility]  Contact [phone] Monday through Friday 8am- 5pm  After hours call [phone]

## 2013-04-14 DIAGNOSIS — E061 Subacute thyroiditis: Secondary | ICD-10-CM | POA: Insufficient documentation

## 2013-05-03 ENCOUNTER — Non-Acute Institutional Stay (SKILLED_NURSING_FACILITY): Payer: 59 | Admitting: Adult Health

## 2013-05-03 ENCOUNTER — Encounter: Payer: Self-pay | Admitting: Adult Health

## 2013-05-03 DIAGNOSIS — I6350 Cerebral infarction due to unspecified occlusion or stenosis of unspecified cerebral artery: Secondary | ICD-10-CM

## 2013-05-03 DIAGNOSIS — E061 Subacute thyroiditis: Secondary | ICD-10-CM

## 2013-05-03 DIAGNOSIS — K5900 Constipation, unspecified: Secondary | ICD-10-CM

## 2013-05-03 DIAGNOSIS — F0390 Unspecified dementia without behavioral disturbance: Secondary | ICD-10-CM

## 2013-05-03 DIAGNOSIS — I639 Cerebral infarction, unspecified: Secondary | ICD-10-CM

## 2013-05-03 DIAGNOSIS — I10 Essential (primary) hypertension: Secondary | ICD-10-CM

## 2013-05-03 NOTE — Progress Notes (Signed)
Patient ID: Sylvia Medina, female   DOB: December 29, 1935, 78 y.o.   MRN: [id number]     [facility]  No Known Allergies   Chief Complaint  Patient presents with  . Medical Managment of Chronic Issues    HPI:  She is being seen for the management of her chronic illnesses. Overall her status remains without change in the recent past. There are no concerns being voiced by the nursing staff at this time.  She is not voicing any complaints at this time.    Past Medical History  Diagnosis Date  . Dementia 07/04/2012  . Anemia 07/04/2012  . Unspecified constipation 07/04/2012  . Unspecified vitamin D deficiency 07/04/2012  . Diabetes mellitus type 2, diet-controlled 07/04/2012  . CVA (cerebral vascular accident) 07/04/2012  . Unspecified hypothyroidism 07/04/2012  . Cardiac arrhythmia 07/04/2012    No past surgical history on file.  VITAL SIGNS BP 119/75  Pulse 69  Ht 5\' 5"  (1.651 m)  Wt 218 lb 4.8 oz (99.02 kg)  BMI 36.33 kg/m2   Patient's Medications  New Prescriptions   No medications on file  Previous Medications   METOPROLOL TARTRATE (LOPRESSOR) 25 MG TABLET    Take 0.5 tablets (12.5 mg total) by mouth daily.   POLYETHYLENE GLYCOL POWDER (GLYCOLAX/MIRALAX) POWDER    Take 17 g by mouth daily.   RIVASTIGMINE (EXELON) 13.3 MG/24HR PT24    Place 1 patch (13.3 mg total) onto the skin daily.   THERAPEUTIC MULTIVITAMIN-MINERALS (THERAGRAN-M) TABLET    Take 1 tablet by mouth daily.  Modified Medications   No medications on file  Discontinued Medications   No medications on file    SIGNIFICANT DIAGNOSTIC EXAMS   LABS REVIEWED  05-22-12: vit d 45 07-05-12: wbc 6.1; hgb 13.3; hct 42.1; mcv 82.2; plt 204; glucose 117; bun 34; creat 1.00; k+4.7 Na++141; tsh 1.366; hgb a1c 5.4  11-28-12: wbc 5.4; hgb 11.7; hct 37.4; mcv 84.6.; plt 245; glucose 84; bun 27; creat 0.7; k+4.1; na++145; vit d 41.31  03-08-13: tsh 0.208 03-18-13: tsh 0.246 03-25-13: free t3: 2.75; free t4: 0.93      Review of Systems  Constitutional: Negative for malaise/fatigue.  Respiratory: Negative for cough.   Cardiovascular: Negative for chest pain.  Gastrointestinal: Negative for heartburn and abdominal pain.  Musculoskeletal: Negative for myalgias and joint pain.  Skin: Negative.   Neurological: Negative for headaches.  Psychiatric/Behavioral: Negative for depression. The patient does not have insomnia.     Physical Exam  Constitutional: She appears well-developed and well-nourished.  overweight  Neck: Neck supple. No JVD present. No thyromegaly present.  Cardiovascular: Normal rate and intact distal pulses.   Heart rate slightly irregular   Respiratory: Effort normal and breath sounds normal. No respiratory distress. She has no wheezes.  GI: Soft. Bowel sounds are normal. She exhibits no distension. There is no tenderness.  Musculoskeletal: She exhibits no edema.  Has full range of motion upper extremities; is able to raise lower extremities slightly against gravity.   Neurological: She is alert.  Skin: Skin is warm and dry.  Psychiatric: She has a normal mood and affect.     ASSESSMENT/ PLAN:  Unspecified constipation Will continue miralax daily and will monitor  Dementia Is without change in status will continue exelon patch 13.3 mg daily and will continue to monitor her status she does remain in bed per her choice  HTN (hypertension) Her blood pressure is stable will continue lopressor 12.5 mg  daily and will continue to  monitor her status   Compensated thyroiditis Her thyroid labs are stable; she is presently not on medications; will not make changes; will continue to monitor her labs over time and will further address as indicated.   CVA She is neurologically stable; is presently not on medications; will not make changes will continue to monitor her status.           Synthia Innocent NP [facility]  Contact [phone] Monday through Friday  8am- 5pm  After hours call [phone]

## 2013-05-29 LAB — BASIC METABOLIC PANEL
BUN: 26 mg/dL — AB (ref 4–21)
CREATININE: 0.7 mg/dL (ref 0.5–1.1)
Glucose: 83 mg/dL
POTASSIUM: 4.2 mmol/L (ref 3.4–5.3)
Sodium: 145 mmol/L (ref 137–147)

## 2013-05-29 LAB — CBC AND DIFFERENTIAL
HCT: 38 % (ref 36–46)
Hemoglobin: 11.1 g/dL — AB (ref 12.0–16.0)
PLATELETS: 260 10*3/uL (ref 150–399)
WBC: 6.7 10*3/mL

## 2013-05-29 LAB — VITAMIN D 25 HYDROXY (VIT D DEFICIENCY, FRACTURES): Vit D, 1,25-Dihydroxy: 41.31

## 2013-06-03 ENCOUNTER — Non-Acute Institutional Stay (SKILLED_NURSING_FACILITY): Payer: 59 | Admitting: Adult Health

## 2013-06-03 DIAGNOSIS — I6350 Cerebral infarction due to unspecified occlusion or stenosis of unspecified cerebral artery: Secondary | ICD-10-CM

## 2013-06-03 DIAGNOSIS — E061 Subacute thyroiditis: Secondary | ICD-10-CM

## 2013-06-03 DIAGNOSIS — K5900 Constipation, unspecified: Secondary | ICD-10-CM

## 2013-06-03 DIAGNOSIS — F0390 Unspecified dementia without behavioral disturbance: Secondary | ICD-10-CM

## 2013-06-03 DIAGNOSIS — I10 Essential (primary) hypertension: Secondary | ICD-10-CM

## 2013-06-03 DIAGNOSIS — I639 Cerebral infarction, unspecified: Secondary | ICD-10-CM

## 2013-06-04 LAB — BASIC METABOLIC PANEL
BUN: 26 mg/dL — AB (ref 4–21)
Creatinine: 0.7 mg/dL (ref 0.5–1.1)
Glucose: 74 mg/dL
Potassium: 4.1 mmol/L (ref 3.4–5.3)
Sodium: 143 mmol/L (ref 137–147)

## 2013-06-04 LAB — CBC AND DIFFERENTIAL
HCT: 38 % (ref 36–46)
HEMOGLOBIN: 11.5 g/dL — AB (ref 12.0–16.0)
Platelets: 243 10*3/uL (ref 150–399)
WBC: 6.3 10*3/mL

## 2013-06-04 LAB — HEPATIC FUNCTION PANEL
ALK PHOS: 121 U/L (ref 25–125)
ALT: 21 U/L (ref 7–35)
AST: 23 U/L (ref 13–35)

## 2013-06-12 ENCOUNTER — Encounter: Payer: Self-pay | Admitting: Adult Health

## 2013-06-12 NOTE — Progress Notes (Signed)
Patient ID: Sylvia Medina, female   DOB: [birth year], 78 y.o.   MRN: [id number]     [facility]  No Known Allergies   Chief Complaint  Patient presents with  . Medical Management of Chronic Issues    HPI:  She is being seen for the management of her chronic illnesses. Overall she remains stable. There are no concerns being voiced by the nursing at this time. She is not voicing any complaints.   Past Medical History  Diagnosis Date  . Dementia 07/04/2012  . Anemia 07/04/2012  . Unspecified constipation 07/04/2012  . Unspecified vitamin D deficiency 07/04/2012  . Diabetes mellitus type 2, diet-controlled 07/04/2012  . CVA (cerebral vascular accident) 07/04/2012  . Unspecified hypothyroidism 07/04/2012  . Cardiac arrhythmia 07/04/2012    No past surgical history on file.  VITAL SIGNS BP 148/50  Pulse 70  Ht 5\' 5"  (1.651 m)  Wt 216 lb (97.977 kg)  BMI 35.94 kg/m2   Patient's Medications  New Prescriptions   No medications on file  Previous Medications   METOPROLOL TARTRATE (LOPRESSOR) 25 MG TABLET    Take 0.5 tablets (12.5 mg total) by mouth daily.   POLYETHYLENE GLYCOL POWDER (GLYCOLAX/MIRALAX) POWDER    Take 17 g by mouth daily.   RIVASTIGMINE (EXELON) 13.3 MG/24HR PT24    Place 1 patch (13.3 mg total) onto the skin daily.   THERAPEUTIC MULTIVITAMIN-MINERALS (THERAGRAN-M) TABLET    Take 1 tablet by mouth daily.  Modified Medications   No medications on file  Discontinued Medications   No medications on file    SIGNIFICANT DIAGNOSTIC EXAMS  LABS REVIEWED  07-05-12: wbc 6.1; hgb 13.3; hct 42.1; mcv 82.2; plt 204; glucose 117; bun 34; creat 1.00; k+4.7 Na++141; tsh 1.366; hgb a1c 5.4  11-28-12: wbc 5.4; hgb 11.7; hct 37.4; mcv 84.6.; plt 245; glucose 84; bun 27; creat 0.7; k+4.1; na++145; vit d 41.31  03-08-13: tsh 0.208 03-18-13: tsh 0.246 03-25-13: free t3: 2.75; free t4: 0.93     Review of Systems  Constitutional: Negative for malaise/fatigue.  Respiratory:  Negative for cough.   Cardiovascular: Negative for chest pain.  Gastrointestinal: Negative for heartburn and abdominal pain.  Musculoskeletal: Negative for myalgias and joint pain.  Skin: Negative.   Neurological: Negative for headaches.  Psychiatric/Behavioral: Negative for depression. The patient does not have insomnia.     Physical Exam  Constitutional: She appears well-developed and well-nourished.  overweight  Neck: Neck supple. No JVD present. No thyromegaly present.  Cardiovascular: Normal rate and intact distal pulses.   Heart rate slightly irregular   Respiratory: Effort normal and breath sounds normal. No respiratory distress. She has no wheezes.  GI: Soft. Bowel sounds are normal. She exhibits no distension. There is no tenderness.  Musculoskeletal: She exhibits no edema.  Has full range of motion upper extremities; is able to raise lower extremities slightly against gravity.   Neurological: She is alert.  Skin: Skin is warm and dry.  Psychiatric: She has a normal mood and affect.     ASSESSMENT/ PLAN:  Unspecified constipation Will continue miralax daily and will monitor  Dementia Is without change in status will continue exelon patch 13.3 mg daily and will continue to monitor her status she does remain in bed per her choice  HTN (hypertension) Her blood pressure is stable will continue lopressor 12.5 mg  daily and will continue to monitor her status   Compensated thyroiditis Her thyroid labs are stable; she is presently not on medications; will  not make changes; will continue to monitor her labs over time and will further address as indicated.   CVA She is neurologically stable; is presently not on medications; will not make changes will continue to monitor her status.     Will check cbc; cmp Next month will check tsh free t3 and free t4       Synthia Innocenteborah Louvenia Golomb NP [facility]  Contact [phone] Monday through Friday 8am- 5pm  After hours  call [phone]

## 2013-07-01 LAB — TSH: TSH: 1.11 u[IU]/mL (ref 0.41–5.90)

## 2013-07-02 ENCOUNTER — Non-Acute Institutional Stay (SKILLED_NURSING_FACILITY): Payer: 59 | Admitting: Adult Health

## 2013-07-02 DIAGNOSIS — I10 Essential (primary) hypertension: Secondary | ICD-10-CM

## 2013-07-02 DIAGNOSIS — E061 Subacute thyroiditis: Secondary | ICD-10-CM

## 2013-07-02 DIAGNOSIS — I6350 Cerebral infarction due to unspecified occlusion or stenosis of unspecified cerebral artery: Secondary | ICD-10-CM

## 2013-07-02 DIAGNOSIS — F0390 Unspecified dementia without behavioral disturbance: Secondary | ICD-10-CM

## 2013-07-02 DIAGNOSIS — I639 Cerebral infarction, unspecified: Secondary | ICD-10-CM

## 2013-07-11 ENCOUNTER — Encounter: Payer: Self-pay | Admitting: Adult Health

## 2013-07-11 NOTE — Progress Notes (Signed)
Patient ID: Sylvia Medina, female   DOB: 15-Nov-1935, 78 y.o.   MRN: [id number]    [facility]  No Known Allergies   Chief Complaint  Patient presents with  . Annual Exam    HPI:  She is being seen for her annual review. There are no concerns being voiced by the nursing staff at this time. She has remained stable over the past several months. She is not voicing any complaints. Her thyroid labs are pending.  Her health maintenance is up to date.   Past Medical History  Diagnosis Date  . Dementia 07/04/2012  . Anemia 07/04/2012  . Unspecified constipation 07/04/2012  . Unspecified vitamin D deficiency 07/04/2012  . Diabetes mellitus type 2, diet-controlled 07/04/2012  . CVA (cerebral vascular accident) 07/04/2012  . Unspecified hypothyroidism 07/04/2012  . Cardiac arrhythmia 07/04/2012    No past surgical history on file.  VITAL SIGNS BP 130/86  Pulse 74  Ht _0  (1.651 m)  Wt 225 lb (102.059 kg)  BMI 37.44 kg/m2   Patient's Medications  New Prescriptions   No medications on file  Previous Medications   METOPROLOL TARTRATE (LOPRESSOR) 25 MG TABLET    Take 0.5 tablets (12.5 mg total) by mouth daily.   POLYETHYLENE GLYCOL POWDER (GLYCOLAX/MIRALAX) POWDER    Take 17 g by mouth daily.   RIVASTIGMINE (EXELON) 13.3 MG/24HR PT24    Place 1 patch (13.3 mg total) onto the skin daily.   THERAPEUTIC MULTIVITAMIN-MINERALS (THERAGRAN-M) TABLET    Take 1 tablet by mouth daily.  Modified Medications   No medications on file  Discontinued Medications   No medications on file    SIGNIFICANT DIAGNOSTIC EXAMS  LABS REVIEWED   11-28-12: wbc 5.4; hgb 11.7; hct 37.4; mcv 84.6.; plt 245; glucose 84; bun 27; creat 0.7; k+4.1; na++145; vit d 41.31  03-08-13: tsh 0.208 03-18-13: tsh 0.246 03-25-13: free t3: 2.75; free t4: 0.93  05-29-13; wbc 6.7; hgb 11.1; hct 37.6 mcv 86.0; plt 260; glucose 83; bun 26; creat 0.7; k+4.2;  na++ 145; vit d 41.31  06-04-13; wbc 6.3; hgb 11.5; hct 38.3; mcv 84.7;  plt 243; glucose 74; bun 26; creat 0.7; k+4.1;na+143; alk phos 121; albumin 2.8     Review of Systems  Constitutional: Negative for malaise/fatigue.  Respiratory: Negative for cough.   Cardiovascular: Negative for chest pain.  Gastrointestinal: Negative for heartburn and abdominal pain.  Musculoskeletal: Negative for myalgias and joint pain.  Skin: Negative.   Neurological: Negative for headaches.  Psychiatric/Behavioral: Negative for depression. The patient does not have insomnia.     Physical Exam  Constitutional: She appears well-developed and well-nourished.  overweight  Neck: Neck supple. No JVD present. No thyromegaly present.  Cardiovascular: Normal rate and intact distal pulses.   Heart rate slightly irregular   Respiratory: Effort normal and breath sounds normal. No respiratory distress. She has no wheezes.  GI: Soft. Bowel sounds are normal. She exhibits no distension. There is no tenderness.  Musculoskeletal: She exhibits no edema.  Has full range of motion upper extremities; is able to raise lower extremities slightly against gravity.   Neurological: She is alert.  Skin: Skin is warm and dry.  Psychiatric: She has a normal mood and affect.     ASSESSMENT/ PLAN:  Unspecified constipation Will continue miralax daily and will monitor  Dementia Is without change in status will continue exelon patch 13.3 mg daily and will continue to monitor her status she does remain in bed per her choice  HTN (hypertension) Her blood pressure is stable will continue lopressor 12.5 mg  daily and will continue to monitor her status   Compensated thyroiditis Her thyroid labs are stable; she is presently not on medications; will not make changes; will continue to monitor her labs over time and will further address as indicated.   CVA She is neurologically stable; is presently not on medications; will not make changes will continue to monitor her status.     Health maintenance is  up to date.       Ok Edwards NP [facility]  Contact [phone] Monday through Friday 8am- 5pm  After hours call [phone]

## 2013-07-31 ENCOUNTER — Encounter: Payer: Self-pay | Admitting: Adult Health

## 2013-07-31 ENCOUNTER — Non-Acute Institutional Stay (SKILLED_NURSING_FACILITY): Payer: 59 | Admitting: Adult Health

## 2013-07-31 DIAGNOSIS — F0390 Unspecified dementia without behavioral disturbance: Secondary | ICD-10-CM

## 2013-07-31 DIAGNOSIS — I10 Essential (primary) hypertension: Secondary | ICD-10-CM

## 2013-07-31 DIAGNOSIS — E061 Subacute thyroiditis: Secondary | ICD-10-CM

## 2013-07-31 DIAGNOSIS — I639 Cerebral infarction, unspecified: Secondary | ICD-10-CM

## 2013-07-31 DIAGNOSIS — I6350 Cerebral infarction due to unspecified occlusion or stenosis of unspecified cerebral artery: Secondary | ICD-10-CM

## 2013-07-31 DIAGNOSIS — K5900 Constipation, unspecified: Secondary | ICD-10-CM

## 2013-07-31 NOTE — Progress Notes (Signed)
Patient ID: Sylvia Medina, female   DOB: 1935-11-12, 78 y.o.   MRN: [id number]     [facility]  No Known Allergies   Chief Complaint  Patient presents with  . Medical Management of Chronic Issues    HPI:  She is being seen for the management of her chronic illnesses. Overall she remains without significant change in her status. There are no concerns being voiced by the nursing staff at this time. She is is not voicing any complaints or concerns as well.    Past Medical History  Diagnosis Date  . Dementia 07/04/2012  . Anemia 07/04/2012  . Unspecified constipation 07/04/2012  . Unspecified vitamin D deficiency 07/04/2012  . Diabetes mellitus type 2, diet-controlled 07/04/2012  . CVA (cerebral vascular accident) 07/04/2012  . Unspecified hypothyroidism 07/04/2012  . Cardiac arrhythmia 07/04/2012    No past surgical history on file.  VITAL SIGNS BP 131/78  Pulse 88  Ht 5' 5"  (1.651 m)  Wt 225 lb 11.2 oz (102.377 kg)  BMI 37.56 kg/m2   Patient's Medications  New Prescriptions   No medications on file  Previous Medications   METOPROLOL TARTRATE (LOPRESSOR) 25 MG TABLET    Take 0.5 tablets (12.5 mg total) by mouth daily.   POLYETHYLENE GLYCOL POWDER (GLYCOLAX/MIRALAX) POWDER    Take 17 g by mouth daily.   RIVASTIGMINE (EXELON) 13.3 MG/24HR PT24    Place 1 patch (13.3 mg total) onto the skin daily.   THERAPEUTIC MULTIVITAMIN-MINERALS (THERAGRAN-M) TABLET    Take 1 tablet by mouth daily.  Modified Medications   No medications on file  Discontinued Medications   No medications on file    SIGNIFICANT DIAGNOSTIC EXAMS  LABS REVIEWED   11-28-12: wbc 5.4; hgb 11.7; hct 37.4; mcv 84.6.; plt 245; glucose 84; bun 27; creat 0.7; k+4.1; na++145; vit d 41.31  03-08-13: tsh 0.208 03-18-13: tsh 0.246 03-25-13: free t3: 2.75; free t4: 0.93  05-29-13; wbc 6.7; hgb 11.1; hct 37.6 mcv 86.0; plt 260; glucose 83; bun 26; creat 0.7; k+4.2;  na++ 145; vit d 41.31  06-04-13; wbc 6.3; hgb  11.5; hct 38.3; mcv 84.7; plt 243; glucose 74; bun 26; creat 0.7; k+4.1;na+143; alk phos 121; albumin 2.8  07-01-13: tsh 1.1130     Review of Systems  Constitutional: Negative for malaise/fatigue.  Respiratory: Negative for cough.   Cardiovascular: Negative for chest pain.  Gastrointestinal: Negative for heartburn and abdominal pain.  Musculoskeletal: Negative for myalgias and joint pain.  Skin: Negative.   Neurological: Negative for headaches.  Psychiatric/Behavioral: Negative for depression. The patient does not have insomnia.     Physical Exam  Constitutional: She appears well-developed and well-nourished.  overweight  Neck: Neck supple. No JVD present. No thyromegaly present.  Cardiovascular: Normal rate and intact distal pulses.   Heart rate slightly irregular   Respiratory: Effort normal and breath sounds normal. No respiratory distress. She has no wheezes.  GI: Soft. Bowel sounds are normal. She exhibits no distension. There is no tenderness.  Musculoskeletal: She exhibits no edema.  Has full range of motion upper extremities; is able to raise lower extremities slightly against gravity.   Neurological: She is alert.  Skin: Skin is warm and dry.  Psychiatric: She has a normal mood and affect.     ASSESSMENT/ PLAN:  Unspecified constipation Will continue miralax daily and will monitor  Dementia Is without change in status will continue exelon patch 13.3 mg daily and will continue to monitor her status she does remain  in bed per her choice  HTN (hypertension) Her blood pressure is stable will continue lopressor 12.5 mg  daily and will continue to monitor her status   Compensated thyroiditis Her thyroid labs are stable; she is presently not on medications; will not make changes; will continue to monitor her labs over time and will further address as indicated.   CVA She is neurologically stable; is presently not on medications; will not make changes will continue to  monitor her status.        Ok Edwards NP [facility]  Contact [phone] Monday through Friday 8am- 5pm  After hours call [phone]

## 2013-08-29 ENCOUNTER — Non-Acute Institutional Stay (SKILLED_NURSING_FACILITY): Payer: 59 | Admitting: Internal Medicine

## 2013-08-29 ENCOUNTER — Encounter: Payer: Self-pay | Admitting: Internal Medicine

## 2013-08-29 DIAGNOSIS — I10 Essential (primary) hypertension: Secondary | ICD-10-CM

## 2013-08-29 DIAGNOSIS — K5900 Constipation, unspecified: Secondary | ICD-10-CM

## 2013-08-29 DIAGNOSIS — F0390 Unspecified dementia without behavioral disturbance: Secondary | ICD-10-CM

## 2013-08-29 NOTE — Progress Notes (Signed)
Patient ID: Sylvia Medina, female   DOB: Jun 25, 1935, 78 y.o.   MRN: [id number]    Facility: [facility]  Chief Complaint  Patient presents with  . Medical Management of Chronic Issues    routine visit   No Known Allergies  HPI 78 y/o long term care resident seen today for routine visit. She is in her bed, refuses to come out. No new concerns from staff. No falls reported. No new behavior changes. Weight remains stable. No new skin concern.   Review of Systems  Constitutional: Negative for malaise/fatigue.  Respiratory: Negative for cough.   Cardiovascular: Negative for chest pain.  Gastrointestinal: Negative for heartburn and abdominal pain. No constipation GU: no urinary complaints Musculoskeletal: Negative for myalgias and joint pain.  Skin: Negative.   Neurological: Negative for headaches.  Psychiatric/Behavioral: Negative for depression. The patient does not have insomnia.   Past Medical History  Diagnosis Date  . Dementia 07/04/2012  . Anemia 07/04/2012  . Unspecified constipation 07/04/2012  . Unspecified vitamin D deficiency 07/04/2012  . Diabetes mellitus type 2, diet-controlled 07/04/2012  . CVA (cerebral vascular accident) 07/04/2012  . Unspecified hypothyroidism 07/04/2012  . Cardiac arrhythmia 07/04/2012   Current Outpatient Prescriptions on File Prior to Visit  Medication Sig Dispense Refill  . metoprolol tartrate (LOPRESSOR) 25 MG tablet Take 0.5 tablets (12.5 mg total) by mouth daily.      . polyethylene glycol powder (GLYCOLAX/MIRALAX) powder Take 17 g by mouth daily.      . Rivastigmine (EXELON) 13.3 MG/24HR PT24 Place 1 patch (13.3 mg total) onto the skin daily.  30 patch    . therapeutic multivitamin-minerals (THERAGRAN-M) tablet Take 1 tablet by mouth daily.       No current facility-administered medications on file prior to visit.   Physical exam BP 144/61  Pulse 78  Temp(Src) 97 F (36.1 C)  Resp 16  SpO2 95%     Constitutional: She appears well-developed and well-nourished. In no acute distress Neck: Neck supple. No JVD present. No thyromegaly present.  Cardiovascular: Normal rate and intact distal pulses.    Respiratory: Effort normal and breath sounds normal. No respiratory distress. She has no wheezes.  GI: Soft. Bowel sounds are normal. She exhibits no distension. There is no tenderness.  Musculoskeletal: She exhibits no edema. Normal UE strength, weakness of LE Neurological: She is alert.  Skin: Skin is warm and dry.  Psychiatric: She has a normal mood and affect.   Labs 11-28-12: wbc 5.4; hgb 11.7; hct 37.4; mcv 84.6.; plt 245; glucose 84; bun 27; creat 0.7; k+4.1; na++145; vit d 41.31   03-08-13: tsh 0.208 03-18-13: tsh 0.246 03-25-13: free t3: 2.75; free t4: 0.93   05-29-13; wbc 6.7; hgb 11.1; hct 37.6 mcv 86.0; plt 260; glucose 83; bun 26; creat 0.7; k+4.2;   na++ 145; vit d 41.31   06-04-13; wbc 6.3; hgb 11.5; hct 38.3; mcv 84.7; plt 243; glucose 74; bun 26; creat 0.7; k+4.1;na+143; alk phos 121; albumin 2.8   07-01-13: tsh 1.1130   Assessment/plan  Hypertension Stable. Continue her lopressor 12.5 mg daily for now and monitor. Heart rate controlled  Dementia Unchanged. Continue her exelon patch. Monitor for acute behavior change. Skin care  constipation Will continue miralax daily and monitor

## 2013-09-25 ENCOUNTER — Non-Acute Institutional Stay (SKILLED_NURSING_FACILITY): Payer: 59 | Admitting: Adult Health

## 2013-09-25 DIAGNOSIS — K5900 Constipation, unspecified: Secondary | ICD-10-CM

## 2013-09-25 DIAGNOSIS — E119 Type 2 diabetes mellitus without complications: Secondary | ICD-10-CM

## 2013-09-25 DIAGNOSIS — I10 Essential (primary) hypertension: Secondary | ICD-10-CM

## 2013-09-25 DIAGNOSIS — F0390 Unspecified dementia without behavioral disturbance: Secondary | ICD-10-CM

## 2013-09-25 DIAGNOSIS — I639 Cerebral infarction, unspecified: Secondary | ICD-10-CM

## 2013-09-25 DIAGNOSIS — E061 Subacute thyroiditis: Secondary | ICD-10-CM

## 2013-09-25 DIAGNOSIS — I6350 Cerebral infarction due to unspecified occlusion or stenosis of unspecified cerebral artery: Secondary | ICD-10-CM

## 2013-09-27 LAB — HEMOGLOBIN A1C: Hgb A1c MFr Bld: 5.5 % (ref 4.0–6.0)

## 2013-09-27 LAB — ALBUMIN, URINE, RANDOM: MICROALB UR: 2.2

## 2013-10-07 ENCOUNTER — Encounter: Payer: Self-pay | Admitting: Adult Health

## 2013-10-07 DIAGNOSIS — I10 Essential (primary) hypertension: Secondary | ICD-10-CM | POA: Insufficient documentation

## 2013-10-07 NOTE — Progress Notes (Signed)
Patient ID: Sylvia Medina, female   DOB: Jun 21, 1935, 78 y.o.   MRN: [id number]    [facility]  No Known Allergies   Chief Complaint  Patient presents with  . Medical Management of Chronic Issues    HPI:  She is a long term resident of this facility being seen for the management of her chronic illnesses. Overall her status remains stable. She states that she feels good and is not voicing any complaints. There are no concerns being voiced by the nursing staff at this time.    Past Medical History  Diagnosis Date  . Dementia 07/04/2012  . Anemia 07/04/2012  . Unspecified constipation 07/04/2012  . Unspecified vitamin D deficiency 07/04/2012  . Diabetes mellitus type 2, diet-controlled 07/04/2012  . CVA (cerebral vascular accident) 07/04/2012  . Unspecified hypothyroidism 07/04/2012  . Cardiac arrhythmia 07/04/2012    No past surgical history on file.  VITAL SIGNS BP 142/62  Pulse 68  Ht 5' 5"  (1.651 m)  Wt 230 lb (104.327 kg)  BMI 38.27 kg/m2   Patient's Medications  New Prescriptions   No medications on file  Previous Medications   METOPROLOL TARTRATE (LOPRESSOR) 25 MG TABLET    Take 0.5 tablets (12.5 mg total) by mouth daily.   POLYETHYLENE GLYCOL POWDER (GLYCOLAX/MIRALAX) POWDER    Take 17 g by mouth daily.   RIVASTIGMINE (EXELON) 13.3 MG/24HR PT24    Place 1 patch (13.3 mg total) onto the skin daily.   THERAPEUTIC MULTIVITAMIN-MINERALS (THERAGRAN-M) TABLET    Take 1 tablet by mouth daily.  Modified Medications   No medications on file  Discontinued Medications   No medications on file    SIGNIFICANT DIAGNOSTIC EXAMS   LABS REVIEWED   11-28-12: wbc 5.4; hgb 11.7; hct 37.4; mcv 84.6.; plt 245; glucose 84; bun 27; creat 0.7; k+4.1; na++145; vit d 41.31  03-08-13: tsh 0.208 03-18-13: tsh 0.246 03-25-13: free t3: 2.75; free t4: 0.93  05-29-13; wbc 6.7; hgb 11.1; hct 37.6 mcv 86.0; plt 260; glucose 83; bun 26; creat 0.7; k+4.2;  na++ 145; vit d 41.31  06-04-13; wbc  6.3; hgb 11.5; hct 38.3; mcv 84.7; plt 243; glucose 74; bun 26; creat 0.7; k+4.1;na+143; alk phos 121; albumin 2.8  07-01-13: tsh 1.1130     Review of Systems  Constitutional: Negative for malaise/fatigue.  Respiratory: Negative for cough.   Cardiovascular: Negative for chest pain.  Gastrointestinal: Negative for heartburn and abdominal pain.  Musculoskeletal: Negative for myalgias and joint pain.  Skin: Negative.   Neurological: Negative for headaches.  Psychiatric/Behavioral: Negative for depression. The patient does not have insomnia.     Physical Exam  Constitutional: She appears well-developed and well-nourished.  overweight  Neck: Neck supple. No JVD present. No thyromegaly present.  Cardiovascular: Normal rate and intact distal pulses.   Heart rate slightly irregular   Respiratory: Effort normal and breath sounds normal. No respiratory distress. She has no wheezes.  GI: Soft. Bowel sounds are normal. She exhibits no distension. There is no tenderness.  Musculoskeletal: She exhibits no edema.  Has full range of motion upper extremities; is able to raise lower extremities slightly against gravity.   Neurological: She is alert.  Skin: Skin is warm and dry.  Psychiatric: She has a normal mood and affect.     ASSESSMENT/ PLAN:  1. Unspecified constipation Will continue miralax daily and will monitor  2. Dementia Is without change in status will continue exelon patch 13.3 mg daily and will continue to monitor her  status she does remain in bed per her choice  3. HTN (hypertension) Her blood pressure is stable will continue lopressor 12.5 mg  daily and will continue to monitor her status   4. Compensated thyroiditis Her thyroid labs are stable; she is presently not on medications; will not make changes; will continue to monitor her labs over time and will further address as indicated.   5. CVA She is neurologically stable; is presently not on medications; will not make  changes will continue to monitor her status.   6. Diabetes She is presently stable and not taking medications at this time; will not make changes will check hgb a1c and urine for micro-albumin. Will monitor        Ok Edwards NP [facility]  Contact [phone] Monday through Friday 8am- 5pm  After hours call [phone]

## 2013-10-22 ENCOUNTER — Non-Acute Institutional Stay (SKILLED_NURSING_FACILITY): Payer: 59 | Admitting: Adult Health

## 2013-10-22 DIAGNOSIS — E119 Type 2 diabetes mellitus without complications: Secondary | ICD-10-CM

## 2013-10-22 DIAGNOSIS — E061 Subacute thyroiditis: Secondary | ICD-10-CM

## 2013-10-22 DIAGNOSIS — K5900 Constipation, unspecified: Secondary | ICD-10-CM

## 2013-10-22 DIAGNOSIS — I10 Essential (primary) hypertension: Secondary | ICD-10-CM

## 2013-10-22 DIAGNOSIS — I639 Cerebral infarction, unspecified: Secondary | ICD-10-CM

## 2013-10-22 DIAGNOSIS — F0390 Unspecified dementia without behavioral disturbance: Secondary | ICD-10-CM

## 2013-10-22 DIAGNOSIS — I6350 Cerebral infarction due to unspecified occlusion or stenosis of unspecified cerebral artery: Secondary | ICD-10-CM

## 2013-10-27 ENCOUNTER — Encounter: Payer: Self-pay | Admitting: Adult Health

## 2013-10-27 NOTE — Progress Notes (Signed)
Patient ID: Sylvia Medina, female   DOB: 08/08/1935, 78 y.o.   MRN: [id number]     [facility]  No Known Allergies   Chief Complaint  Patient presents with  . Medical Management of Chronic Issues    HPI:  She is a long term resident of this facility being seen for the management of her chronic illnesses. Overall her status remains without significant change. There are no concerns being voiced by the nursing staff at this time. She is not voicing any complaints today.    Past Medical History  Diagnosis Date  . Dementia 07/04/2012  . Anemia 07/04/2012  . Unspecified constipation 07/04/2012  . Unspecified vitamin D deficiency 07/04/2012  . Diabetes mellitus type 2, diet-controlled 07/04/2012  . CVA (cerebral vascular accident) 07/04/2012  . Unspecified hypothyroidism 07/04/2012  . Cardiac arrhythmia 07/04/2012    No past surgical history on file.  VITAL SIGNS BP 112/78  Pulse 70  Ht 5' 5"  (1.651 m)  Wt 230 lb (104.327 kg)  BMI 38.27 kg/m2   Patient's Medications  New Prescriptions   No medications on file  Previous Medications   METOPROLOL TARTRATE (LOPRESSOR) 25 MG TABLET    Take 0.5 tablets (12.5 mg total) by mouth daily.   POLYETHYLENE GLYCOL POWDER (GLYCOLAX/MIRALAX) POWDER    Take 17 g by mouth daily.   RIVASTIGMINE (EXELON) 13.3 MG/24HR PT24    Place 1 patch (13.3 mg total) onto the skin daily.   THERAPEUTIC MULTIVITAMIN-MINERALS (THERAGRAN-M) TABLET    Take 1 tablet by mouth daily.  Modified Medications   No medications on file  Discontinued Medications   No medications on file    SIGNIFICANT DIAGNOSTIC EXAMS   LABS REVIEWED   11-28-12: wbc 5.4; hgb 11.7; hct 37.4; mcv 84.6.; plt 245; glucose 84; bun 27; creat 0.7; k+4.1; na++145; vit d 41.31  03-08-13: tsh 0.208 03-18-13: tsh 0.246 03-25-13: free t3: 2.75; free t4: 0.93  05-29-13; wbc 6.7; hgb 11.1; hct 37.6 mcv 86.0; plt 260; glucose 83; bun 26; creat 0.7; k+4.2;  na++ 145; vit d 41.31  06-04-13; wbc 6.3;  hgb 11.5; hct 38.3; mcv 84.7; plt 243; glucose 74; bun 26; creat 0.7; k+4.1;na+143; alk phos 121; albumin 2.8  07-01-13: tsh 1.1130  09-27-13: hgb a1c 5.5; urine for micro-albumin 2.2     Review of Systems  Constitutional: Negative for malaise/fatigue.  Respiratory: Negative for cough.   Cardiovascular: Negative for chest pain.  Gastrointestinal: Negative for heartburn and abdominal pain.  Musculoskeletal: Negative for  pain.  Skin: Negative.   Neurological: Negative for headaches.  Psychiatric/Behavioral: Negative for depression. The patient does not have insomnia.     Physical Exam  Constitutional: She appears well-developed and well-nourished.  overweight  Neck: Neck supple. No JVD present. No thyromegaly present.  Cardiovascular: Normal rate and intact distal pulses.   Heart rate slightly irregular   Respiratory: Effort normal and breath sounds normal. No respiratory distress. She has no wheezes.  GI: Soft. Bowel sounds are normal. She exhibits no distension. There is no tenderness.  Musculoskeletal: She exhibits no edema.  Has full range of motion upper extremities; is able to raise lower extremities slightly against gravity.   Neurological: She is alert.  Skin: Skin is warm and dry.  Psychiatric: She has a normal mood and affect.     ASSESSMENT/ PLAN:  1. Unspecified constipation Will continue miralax daily and will monitor  2. Dementia Is without change in status will continue exelon patch 13.3 mg daily and  will continue to monitor her status she does remain in bed per her choice  3. HTN (hypertension) Her blood pressure is stable will continue lopressor 12.5 mg  daily and will continue to monitor her status   4. Compensated thyroiditis Her thyroid labs are stable; she is presently not on medications; will not make changes; will continue to monitor her labs over time and will further address as indicated.   5. CVA She is neurologically stable; is presently not on  medications; will not make changes will continue to monitor her status.   6. Diabetes She is presently stable and not taking medications at this time; will not make changes will check hgb a1c and urine for micro-albumin. Will monitor        Ok Edwards NP [facility]  Contact [phone] Monday through Friday 8am- 5pm  After hours call [phone]

## 2013-11-14 ENCOUNTER — Non-Acute Institutional Stay (SKILLED_NURSING_FACILITY): Payer: 59 | Admitting: Adult Health

## 2013-11-14 DIAGNOSIS — K5900 Constipation, unspecified: Secondary | ICD-10-CM

## 2013-11-14 DIAGNOSIS — I639 Cerebral infarction, unspecified: Secondary | ICD-10-CM

## 2013-11-14 DIAGNOSIS — I10 Essential (primary) hypertension: Secondary | ICD-10-CM

## 2013-11-14 DIAGNOSIS — E119 Type 2 diabetes mellitus without complications: Secondary | ICD-10-CM

## 2013-11-14 DIAGNOSIS — E061 Subacute thyroiditis: Secondary | ICD-10-CM

## 2013-11-14 DIAGNOSIS — F0390 Unspecified dementia without behavioral disturbance: Secondary | ICD-10-CM

## 2013-11-15 LAB — BASIC METABOLIC PANEL
BUN: 32 mg/dL — AB (ref 4–21)
Creatinine: 0.8 mg/dL (ref 0.5–1.1)
Glucose: 85 mg/dL
POTASSIUM: 4.4 mmol/L (ref 3.4–5.3)
Sodium: 144 mmol/L (ref 137–147)

## 2013-11-15 LAB — CBC AND DIFFERENTIAL
HCT: 35 % — AB (ref 36–46)
HEMOGLOBIN: 10.3 g/dL — AB (ref 12.0–16.0)
Platelets: 236 10*3/uL (ref 150–399)
WBC: 6.5 10*3/mL

## 2013-11-17 ENCOUNTER — Encounter: Payer: Self-pay | Admitting: Adult Health

## 2013-11-17 DIAGNOSIS — K5900 Constipation, unspecified: Secondary | ICD-10-CM | POA: Insufficient documentation

## 2013-11-17 NOTE — Progress Notes (Signed)
Patient ID: Sylvia Medina, female   DOB: 1935/05/23, 78 y.o.   MRN: [id number]     [facility]  No Known Allergies   Chief Complaint  Patient presents with  . Medical Management of Chronic Issues    HPI:  She is a long term resident of this facility being seen for the management of her chronic illnesses. Overall her status remains status. She is not voicing any complaints or concerns. The nursing staff is not voicing any concerns.    Past Medical History  Diagnosis Date  . Dementia 07/04/2012  . Anemia 07/04/2012  . Unspecified constipation 07/04/2012  . Unspecified vitamin D deficiency 07/04/2012  . Diabetes mellitus type 2, diet-controlled 07/04/2012  . CVA (cerebral vascular accident) 07/04/2012  . Unspecified hypothyroidism 07/04/2012  . Cardiac arrhythmia 07/04/2012    No past surgical history on file.  VITAL SIGNS BP 119/80  Pulse 80  Ht _0  (1.651 m)  Wt 231 lb 14.4 oz (105.189 kg)  BMI 38.59 kg/m2   Patient's Medications  New Prescriptions   No medications on file  Previous Medications   METOPROLOL TARTRATE (LOPRESSOR) 25 MG TABLET    Take 0.5 tablets (12.5 mg total) by mouth daily.   POLYETHYLENE GLYCOL POWDER (GLYCOLAX/MIRALAX) POWDER    Take 17 g by mouth daily.   RIVASTIGMINE (EXELON) 13.3 MG/24HR PT24    Place 1 patch (13.3 mg total) onto the skin daily.   THERAPEUTIC MULTIVITAMIN-MINERALS (THERAGRAN-M) TABLET    Take 1 tablet by mouth daily.  Modified Medications   No medications on file  Discontinued Medications   No medications on file    SIGNIFICANT DIAGNOSTIC EXAMS  LABS REVIEWED   11-28-12: wbc 5.4; hgb 11.7; hct 37.4; mcv 84.6.; plt 245; glucose 84; bun 27; creat 0.7; k+4.1; na++145; vit d 41.31  03-08-13: tsh 0.208 03-18-13: tsh 0.246 03-25-13: free t3: 2.75; free t4: 0.93  05-29-13; wbc 6.7; hgb 11.1; hct 37.6 mcv 86.0; plt 260; glucose 83; bun 26; creat 0.7; k+4.2;  na++ 145; vit d 41.31  06-04-13; wbc 6.3; hgb 11.5; hct 38.3; mcv 84.7;  plt 243; glucose 74; bun 26; creat 0.7; k+4.1;na+143; alk phos 121; albumin 2.8  07-01-13: tsh 1.1130  09-27-13: hgb a1c 5.5; urine for micro-albumin 2.2     Review of Systems  Constitutional: Negative for malaise/fatigue.  Respiratory: Negative for cough.   Cardiovascular: Negative for chest pain.  Gastrointestinal: Negative for heartburn and abdominal pain.  Musculoskeletal: Negative for  pain.  Skin: Negative.   Neurological: Negative for headaches.  Psychiatric/Behavioral: Negative for depression. The patient does not have insomnia.     Physical Exam  Constitutional: She appears well-developed and well-nourished.  overweight  Neck: Neck supple. No JVD present. No thyromegaly present.  Cardiovascular: Normal rate and intact distal pulses.   Heart rate slightly irregular   Respiratory: Effort normal and breath sounds normal. No respiratory distress. She has no wheezes.  GI: Soft. Bowel sounds are normal. She exhibits no distension. There is no tenderness.  Musculoskeletal: She exhibits no edema.  Has full range of motion upper extremities; is able to raise lower extremities slightly against gravity.   Neurological: She is alert.  Skin: Skin is warm and dry.  Psychiatric: She has a normal mood and affect.     ASSESSMENT/ PLAN:  1. Unspecified constipation Will continue miralax daily and will monitor  2. Dementia Is without change in status will continue exelon patch 13.3 mg daily and will continue to monitor her  status she does remain in bed per her choice  3. HTN (hypertension) Her blood pressure is stable will continue lopressor 12.5 mg  daily and will continue to monitor her status   4. Compensated thyroiditis Her thyroid labs are stable; she is presently not on medications; will not make changes; will continue to monitor her labs over time and will further address as indicated.   5. CVA She is neurologically stable; is presently not on medications; will not make  changes will continue to monitor her status.   6. Diabetes She is presently stable and not taking medications at this time; will not make changes; her hgb a1c is 5.5   Will monitor    Will check cbc and cmp     Ok Edwards NP [facility]  Contact [phone] Monday through Friday 8am- 5pm  After hours call [phone]

## 2013-12-19 ENCOUNTER — Encounter: Payer: Self-pay | Admitting: Adult Health

## 2013-12-19 ENCOUNTER — Non-Acute Institutional Stay (SKILLED_NURSING_FACILITY): Payer: 59 | Admitting: Adult Health

## 2013-12-19 DIAGNOSIS — I639 Cerebral infarction, unspecified: Secondary | ICD-10-CM

## 2013-12-19 DIAGNOSIS — I10 Essential (primary) hypertension: Secondary | ICD-10-CM

## 2013-12-19 DIAGNOSIS — E119 Type 2 diabetes mellitus without complications: Secondary | ICD-10-CM

## 2013-12-19 DIAGNOSIS — F0390 Unspecified dementia without behavioral disturbance: Secondary | ICD-10-CM

## 2013-12-19 DIAGNOSIS — E061 Subacute thyroiditis: Secondary | ICD-10-CM

## 2013-12-19 NOTE — Progress Notes (Signed)
[facility]  Code Status: Full  No Known Allergies  Chief Complaint: Medical Management of Chronic Health Issues  HPI:  Sylvia Medina is a 78 yr old female being seen today for the management of her chronic illnesses. Overall she is doing the same. There are no new concerns being voiced by the patient or staff. She denies chest pain, shortness of breath, or other pain.   Review of Systems:  Constitutional: Negative for fever, chills, weight loss, malaise/fatigue and diaphoresis.  Marland Kitchen  Respiratory: Negative for cough, sputum production, shortness of breath and wheezing.   Cardiovascular: Negative for chest pain, palpitations, orthopnea and leg swelling.  Gastrointestinal: Negative for heartburn, nausea, vomiting, abdominal pain, diarrhea and constipation.  Genitourinary: Negative for dysuria, urgency, frequency, hematuria and flank pain.  Musculoskeletal: Negative for back pain, falls, joint pain and myalgias.  Skin: Negative for itching and rash.  Neurological: Negative for weakness,dizziness,and headaches.  Psychiatric/Behavioral: The patient is not nervous/anxious.     Past Medical History  Diagnosis Date  . Dementia 07/04/2012  . Anemia 07/04/2012  . Unspecified constipation 07/04/2012  . Unspecified vitamin D deficiency 07/04/2012  . Diabetes mellitus type 2, diet-controlled 07/04/2012  . CVA (cerebral vascular accident) 07/04/2012  . Unspecified hypothyroidism 07/04/2012  . Cardiac arrhythmia 07/04/2012   History reviewed. No pertinent past surgical history. Social History:   reports that she has never smoked. She does not have any smokeless tobacco history on file. She reports that she does not drink alcohol or use illicit drugs.  History reviewed. No pertinent family history.  Medications: Patient's Medications  New Prescriptions   No medications on file  Previous Medications   METOPROLOL TARTRATE (LOPRESSOR) 25 MG TABLET    Take 0.5 tablets (12.5 mg total) by mouth  daily.   POLYETHYLENE GLYCOL POWDER (GLYCOLAX/MIRALAX) POWDER    Take 17 g by mouth daily.   RIVASTIGMINE (EXELON) 13.3 MG/24HR PT24    Place 1 patch (13.3 mg total) onto the skin daily.   THERAPEUTIC MULTIVITAMIN-MINERALS (THERAGRAN-M) TABLET    Take 1 tablet by mouth daily.  Modified Medications   No medications on file  Discontinued Medications   No medications on file     Physical Exam: Filed Vitals:   12/19/13 1132  BP: 161/72  Pulse: 71  Temp: 98 F (36.7 C)  Resp: 20  Weight: 237 lb 14.4 oz (107.911 kg)  SpO2: 98%    General- elderly female in no acute distress; overweight Neck- no lymphadenopathy, no thyromegaly, no jugular vein distension, no carotid bruit Cardiovascular- irregular; no murmurs/ rubs/ gallops; pulses intact; no edema noted Respiratory- bilateral clear to auscultation, no wheeze, no rhonchi, no crackles, no use of accessory muscles Abdomen- bowel sounds present, soft, non tender, no organomegaly, non-distended Musculoskeletal- able to move all 4 extremities primarily bed bound; assists with ADLS Neurological- she is alert Skin- warm and dry Psychiatry- normal mood and affect    SIGNIFICANT DIAGNOSTIC EXAMS  LABS REVIEWED   11-28-12: wbc 5.4; hgb 11.7; hct 37.4; mcv 84.6.; plt 245; glucose 84; bun 27; creat 0.7; k+4.1; na++145; vit d 41.31  03-08-13: tsh 0.208 03-18-13: tsh 0.246 03-25-13: free t3: 2.75; free t4: 0.93  05-29-13; wbc 6.7; hgb 11.1; hct 37.6 mcv 86.0; plt 260; glucose 83; bun 26; creat 0.7; k+4.2;  na++ 145; vit d 41.31  06-04-13; wbc 6.3; hgb 11.5; hct 38.3; mcv 84.7; plt 243; glucose 74; bun 26; creat 0.7; k+4.1;na+143; alk phos 121; albumin 2.8  07-01-13: tsh 1.1130  09-27-13: hgb a1c 5.5; urine for micro-albumin 2.2  11-15-13: wbc 6.5; hgb 10.3; hct 34.8; mch 25.7; mchc 29.5; plt 236; na 144; k 4.4; glucose 85; bun 32; creat 0.8; Ca 8.8  Assessment/Plan   1. Unspecified constipation: no issues; Will continue miralax daily and will  monitor  2. Dementia: Is without change in status; will continue exelon patch 13.3 mg daily and will continue to monitor her status   3. HTN (hypertension): stable; will continue lopressor 12.5 mg  daily and will continue to monitor her status   4. Compensated thyroiditis: she is presently not on medications; will not make changes; will continue to monitor her labs over time and will further address as indicated.   5. CVA: She is neurologically stable; is presently not on medications; will not make changes will continue to monitor her status.   6. Diabetes: A1C 5.5;  stable and not taking medications at this time; Will monitor   Keatts, Demetrius Charity, [place] NP [facility] Adult [facility]  Contact [phone] Monday through Friday 8am- 5pm  After hours call [phone]

## 2013-12-20 LAB — TSH: TSH: 0.59 u[IU]/mL (ref 0.41–5.90)

## 2014-01-22 ENCOUNTER — Encounter: Payer: Self-pay | Admitting: Internal Medicine

## 2014-01-22 ENCOUNTER — Non-Acute Institutional Stay (SKILLED_NURSING_FACILITY): Payer: 59 | Admitting: Internal Medicine

## 2014-01-22 DIAGNOSIS — I10 Essential (primary) hypertension: Secondary | ICD-10-CM

## 2014-01-22 DIAGNOSIS — K5900 Constipation, unspecified: Secondary | ICD-10-CM

## 2014-01-22 DIAGNOSIS — F0390 Unspecified dementia without behavioral disturbance: Secondary | ICD-10-CM

## 2014-01-22 DIAGNOSIS — E119 Type 2 diabetes mellitus without complications: Secondary | ICD-10-CM

## 2014-01-22 NOTE — Progress Notes (Signed)
Patient ID: Sylvia Medina, female   DOB: 11-19-1935, 78 y.o.   MRN: [id number]    Facility: [facility]   Chief Complaint  Patient presents with  . Medical Management of Chronic Issues   No Known Allergies   Code status: full code  HPI 78 y/o female pt is seen today for routine visit. She is lying on her bed, she is in no distress and denies any concerns. No concerns from nursing saff. On review of her vital signs, her SBP has been elevated 150-179. She has PMH of CVA, HTN, DM (diet controlled) and constipation among others. She has dementia and is pleasantly confused. She is complaint with her medications. No falls reported. No new skin concern.  ROS Constitutional: Negative for fever, chills, malaise/fatigue and diaphoresis.  Respiratory: Negative for cough, shortness of breath and wheezing.   Cardiovascular: Negative for chest pain, palpitations, leg swelling.  Gastrointestinal: Negative for heartburn, nausea, vomiting, abdominal pain, diarrhea and constipation.  Genitourinary: Negative for dysuria  Musculoskeletal: Negative for back pain, falls Skin: Negative for itching and rash.  Neurological: Negative for weakness,dizziness,and headaches.  Psychiatric/Behavioral: The patient is not nervous/anxious  Past Medical History  Diagnosis Date  . Dementia 07/04/2012  . Anemia 07/04/2012  . Unspecified constipation 07/04/2012  . Unspecified vitamin D deficiency 07/04/2012  . Diabetes mellitus type 2, diet-controlled 07/04/2012  . CVA (cerebral vascular accident) 07/04/2012  . Unspecified hypothyroidism 07/04/2012  . Cardiac arrhythmia 07/04/2012   History reviewed. No pertinent past surgical history.   Medication List       This list is accurate as of: 01/22/14  2:13 PM.  Always use your most recent med list.               metoprolol tartrate 25 MG tablet  Commonly known as:  LOPRESSOR  Take 0.5 tablets (12.5 mg total) by mouth daily.     polyethylene glycol powder powder  Commonly known as:  GLYCOLAX/MIRALAX  Take 17 g by mouth daily.     Rivastigmine 13.3 MG/24HR Pt24  Commonly known as:  EXELON  Place 1 patch (13.3 mg total) onto the skin daily.     therapeutic multivitamin-minerals tablet  Take 1 tablet by mouth daily.       Physical exam BP 160/65 mmHg  Pulse 71  Temp(Src) 98.1 F (36.7 C)  Resp 18  SpO2 96%  General- elderly female in no acute distress, overweight Neck- no lymphadenopathy Cardiovascular- irregular but controlled rate, no murmurs, distal pulses intact, no leg edema Respiratory- bilateral clear to auscultation, no wheeze, no rhonchi, no crackles, no use of accessory muscles Abdomen- bowel sounds present, soft, non tender Musculoskeletal- able to move all 4 extremities, mainly in bed and requires assistance with her ADLS Neurological- she is alert Skin- warm and dry Psychiatry- normal mood and affect  Labs 05-29-13; wbc 6.7; hgb 11.1; hct 37.6 mcv 86.0; plt 260; glucose 83; bun 26; creat 0.7; k+4.2;   na++ 145; vit d 41.31   06-04-13; wbc 6.3; hgb 11.5; hct 38.3; mcv 84.7; plt 243; glucose 74; bun 26; creat 0.7; k+4.1;na+143; alk phos 121; albumin 2.8   07-01-13: tsh 1.1130   09-27-13: hgb a1c 5.5; urine for micro-albumin 2.2   11-15-13: wbc 6.5; hgb 10.3; hct 34.8; mch 25.7; mchc 29.5; plt 236; na 144; k 4.4; glucose 85; bun 32; creat 0.8; Ca 8.8 12-19-13 tsh 0.59  Assessment/Plan  Hypertension bp elevated. Change lmetoprolol tartrate to 12.5 mg twice daily and  monitor bp readings  Constipation Stable bowel for now, continue miralax  DM Diet controlled. Last a1c in august 5.5. Monitor clinically  alzhimer's Dementia continue exelon patch 13.3 mg daily and monitor her weight, continue skin care

## 2014-02-21 ENCOUNTER — Non-Acute Institutional Stay (SKILLED_NURSING_FACILITY): Payer: 59 | Admitting: Registered Nurse

## 2014-02-21 ENCOUNTER — Encounter: Payer: Self-pay | Admitting: Registered Nurse

## 2014-02-21 DIAGNOSIS — F0390 Unspecified dementia without behavioral disturbance: Secondary | ICD-10-CM

## 2014-02-21 DIAGNOSIS — Z8673 Personal history of transient ischemic attack (TIA), and cerebral infarction without residual deficits: Secondary | ICD-10-CM

## 2014-02-21 DIAGNOSIS — K5900 Constipation, unspecified: Secondary | ICD-10-CM

## 2014-02-21 DIAGNOSIS — I10 Essential (primary) hypertension: Secondary | ICD-10-CM

## 2014-02-21 DIAGNOSIS — D649 Anemia, unspecified: Secondary | ICD-10-CM

## 2014-02-21 NOTE — Progress Notes (Signed)
Patient ID: Sylvia Medina, female   DOB: 09-15-1935, 79 y.o.   MRN: [id number]   Place of Service: [facility]  No Known Allergies  Code Status: Full Code  Goals of Care: Longevity/LTC  Chief Complaint  Patient presents with  . Medical Management of Chronic Issues    old CVA, HTN, dementia, constipation, anemia    HPI 79 y.o. female with PMH of old CVA, dementia, anemia, DM2 among others is being seen for a routine visit for management of her chronic issues. Weight stable. No recent fall or skin concerns reported. No change in behaviors or functional status reported. No concerns from staff. HTN stable on lopressor, bp range 100s-130s/50s-60s. Dementia stable on exelon patch. Constipation stable on miralax. Anemia is stable with most recent hgb/hct 10.3/34.8. Seen in room today. No concerns verbalized from patient   Review of Systems Constitutional: Negative for fever, chills, and fatigue. HENT: Negative for ear pain, congestion, and sore throat Eyes: Negative for eye pain, eye discharge, and visual disturbance  Cardiovascular: Negative for chest pain, palpitations, and leg swelling Respiratory: Negative cough, shortness of breath, and wheezing.  Gastrointestinal: Negative for nausea and vomiting. Negative for abdominal pain Genitourinary: Negative for  dysuria and hematuria Endocrine: Negative for polydipsia, polyphagia, and polyuria Musculoskeletal: Negative for back pain, joint pain, and joint swelling  Neurological: Negative for dizziness and headache.  Skin: Negative for rash and wound.   Psychiatric: Negative for depression  Past Medical History  Diagnosis Date  . Dementia 07/04/2012  . Anemia 07/04/2012  . Unspecified constipation 07/04/2012  . Unspecified vitamin D deficiency 07/04/2012  . Diabetes mellitus type 2, diet-controlled 07/04/2012  . CVA (cerebral vascular accident) 07/04/2012  . Unspecified hypothyroidism 07/04/2012  . Cardiac arrhythmia 07/04/2012     No past surgical history on file.  History   Social History  . Marital Status: Widowed    Spouse Name: N/A    Number of Children: N/A  . Years of Education: N/A   Occupational History  . Not on file.   Social History Main Topics  . Smoking status: Never Smoker   . Smokeless tobacco: Not on file  . Alcohol Use: No  . Drug Use: No  . Sexual Activity: Not Currently   Other Topics Concern  . Not on file   Social History Narrative    No family history on file.    Medication List       This list is accurate as of: 02/21/14  9:15 PM.  Always use your most recent med list.               metoprolol tartrate 25 MG tablet  Commonly known as:  LOPRESSOR  Take 12.5 mg by mouth 2 (two) times daily.     polyethylene glycol powder powder  Commonly known as:  GLYCOLAX/MIRALAX  Take 17 g by mouth daily.     Rivastigmine 13.3 MG/24HR Pt24  Commonly known as:  EXELON  Place 1 patch (13.3 mg total) onto the skin daily.     therapeutic multivitamin-minerals tablet  Take 1 tablet by mouth daily.        Physical Exam  BP 101/68 mmHg  Pulse 69  Temp(Src) 97.9 F (36.6 C)  Resp 20  Ht 5\' 5"  (1.651 m)  Wt 237 lb 4.8 oz (107.639 kg)  BMI 39.49 kg/m2  SpO2 99%  Constitutional: Obese elderly female in no acute distress. Conversant and pleasant HEENT: Normocephalic and atraumatic. PERRL. EOM intact. No  icterus.  No nasal discharge or sinus tenderness. Oral mucosa moist. Posterior pharynx clear of any exudate or lesions.  Neck: Supple and nontender. No lymphadenopathy, masses, or thyromegaly. No JVD or carotid bruits. Cardiac: Normal S1, S2. RRR without appreciable murmurs, rubs, or gallops. Distal pulses intact.Trace dependent edema Lungs: No respiratory distress. Breath sounds clear bilaterally without rales, rhonchi, or wheezes. Abdomen: Audible bowel sounds in all quadrants. Soft, nontender, nondistended.  Musculoskeletal: able to move all extremities. No joint  erythema or tenderness. Right foot drop noted  Skin: Warm and dry. No rash noted. No erythema.  Neurological: Alert and oriented to self Psychiatric: Appropriate mood and affect.   Labs Reviewed  CBC Latest Ref Rng 11/15/2013 06/04/2013 05/29/2013  WBC - 6.5 6.3 6.7  Hemoglobin 12.0 - 16.0 g/dL 10.3(A) 11.5(A) 11.1(A)  Hematocrit 36 - 46 % 35(A) 38 38  Platelets 150 - 399 K/L 236 243 260    CMP Latest Ref Rng 11/15/2013 06/04/2013 05/29/2013  Glucose 70 - 99 mg/dL - - -  BUN 4 - 21 mg/dL 40(J) 81(X) 91(Y)  Creatinine 0.5 - 1.1 mg/dL 0.8 0.7 0.7  Sodium 782 - 147 mmol/L 144 143 145  Potassium 3.4 - 5.3 mmol/L 4.4 4.1 4.2  Chloride 96 - 112 mEq/L - - -  CO2 19 - 32 mEq/L - - -  Calcium 8.4 - 10.5 mg/dL - - -  Alkaline Phos 25 - 125 U/L - 121 -  AST 13 - 35 U/L - 23 -  ALT 7 - 35 U/L - 21 -    Lab Results  Component Value Date   HGBA1C 5.5 09/27/2013    Lab Results  Component Value Date   TSH 0.59 12/20/2013   Assessment & Plan 1. Essential hypertension, benign Stable. Continue lopressor 12.5mg  twice daily. Continue to monitor bp  2. Constipation, unspecified constipation type Stable. Continue miralax 17g daily and monitor. Encourage hydration and OOB as tolerated.   3. Dementia without behavioral disturbance Stable with declined anticipated. Continue exelon patch 13.3mg  daily. Continue to monitor for change in behavior. Fall and pressure ulcer precautions.   4. Status post CVA Neurologically stable. Currently not on any medications. Will check lipid panel. Monitor clinically   5. Anemia, unspecified anemia type Recent hct/hgb 10.3/34.8. Will check folate, b12, and iron panel. Continue to monitor    Diagnostic Studies/Labs Ordered: lipid, folate, b12, iron panel.   Family/Staff Communication Plan of care discussed with resident and nursing staff. Resident and nursing staff verbalized understanding and agree with plan of care. No additional questions or concerns  reported.    Loura Back, MSN, AGNP-C [facility] [address] [place], [place] [address] [phone] [8am-5pm] After hours: [phone]

## 2014-03-25 ENCOUNTER — Encounter: Payer: Self-pay | Admitting: Registered Nurse

## 2014-03-25 ENCOUNTER — Non-Acute Institutional Stay (SKILLED_NURSING_FACILITY): Payer: 59 | Admitting: Registered Nurse

## 2014-03-25 DIAGNOSIS — I10 Essential (primary) hypertension: Secondary | ICD-10-CM

## 2014-03-25 DIAGNOSIS — E119 Type 2 diabetes mellitus without complications: Secondary | ICD-10-CM

## 2014-03-25 DIAGNOSIS — Z8673 Personal history of transient ischemic attack (TIA), and cerebral infarction without residual deficits: Secondary | ICD-10-CM

## 2014-03-25 DIAGNOSIS — K5900 Constipation, unspecified: Secondary | ICD-10-CM

## 2014-03-25 DIAGNOSIS — D649 Anemia, unspecified: Secondary | ICD-10-CM

## 2014-03-25 DIAGNOSIS — F0390 Unspecified dementia without behavioral disturbance: Secondary | ICD-10-CM

## 2014-03-25 NOTE — Progress Notes (Signed)
Patient ID: Sylvia Medina, female   DOB: Oct 20, 1935, 79 y.o.   MRN: [id number]   Place of Service: [facility]  No Known Allergies  Code Status: Full Code  Goals of Care: Longevity/LTC  Chief Complaint  Patient presents with  . Medical Management of Chronic Issues    dementia, old CVA, constipation, HTN, anemia    HPI 79 y.o. female with PMH of old CVA, dementia, anemia, DM2 among others is being seen for a routine visit for management of her chronic issues. Weight stable. No recent fall or skin concerns reported. No change in behaviors or functional status reported. No concerns from staff. HTN stable on lopressor. BP range 108-130/59-78. Dementia stable on exelon patch. No issues with constipation on miralax. Anemia is stable with most recent hgb/hct 10.3/34.8. Seen in room today. No concerns verbalized from patient   Review of Systems Constitutional: Negative for fever, chills, and fatigue. HENT: Negative for ear pain, congestion, and sore throat Eyes: Negative for eye pain and visual disturbance  Cardiovascular: Negative for chest pain and leg swelling Respiratory: Negative cough and shortness of breath  Gastrointestinal: Negative for nausea and vomiting. Negative for abdominal pain Genitourinary: Negative for dysuria and hematuria Musculoskeletal: Negative for back pain  Neurological: Negative for dizziness and headache.  Skin: Negative for rash and wound.   Psychiatric: Negative for depression  Past Medical History  Diagnosis Date  . Dementia 07/04/2012  . Anemia 07/04/2012  . Unspecified constipation 07/04/2012  . Unspecified vitamin D deficiency 07/04/2012  . Diabetes mellitus type 2, diet-controlled 07/04/2012  . CVA (cerebral vascular accident) 07/04/2012  . Unspecified hypothyroidism 07/04/2012  . Cardiac arrhythmia 07/04/2012    No past surgical history on file.  History   Social History  . Marital Status: Widowed    Spouse Name: N/A    Number of  Children: N/A  . Years of Education: N/A   Occupational History  . Not on file.   Social History Main Topics  . Smoking status: Never Smoker   . Smokeless tobacco: Not on file  . Alcohol Use: No  . Drug Use: No  . Sexual Activity: Not Currently   Other Topics Concern  . Not on file   Social History Narrative    No family history on file.    Medication List       This list is accurate as of: 03/25/14  6:16 PM.  Always use your most recent med list.               metoprolol tartrate 25 MG tablet  Commonly known as:  LOPRESSOR  Take 12.5 mg by mouth 2 (two) times daily.     polyethylene glycol powder powder  Commonly known as:  GLYCOLAX/MIRALAX  Take 17 g by mouth daily.     Rivastigmine 13.3 MG/24HR Pt24  Commonly known as:  EXELON  Place 1 patch (13.3 mg total) onto the skin daily.     therapeutic multivitamin-minerals tablet  Take 1 tablet by mouth daily.        Physical Exam  BP 130/78 mmHg  Pulse 68  Temp(Src) 97 F (36.1 C)  Resp 18  Ht  (1.651 m)  Wt 238 lb 9.6 oz (108.228 kg)  BMI 39.71 kg/m2  Constitutional: Obese elderly female in no acute distress. Conversant and pleasant HEENT: Normocephalic and atraumatic. PERRL. EOM intact. No scleral icterus.  No nasal discharge or sinus tenderness. Oral mucosa moist. Posterior pharynx clear of any exudate  or lesions.  Neck: Supple and nontender. No lymphadenopathy, masses, or thyromegaly. No JVD or carotid bruits. Cardiac: Normal S1, S2. RRR with 1/6 systolic murmur. Distal pulses intact.Trace dependent edema Lungs: No respiratory distress. Breath sounds clear bilaterally without rales, rhonchi, or wheezes. Abdomen: Audible bowel sounds in all quadrants. Soft, nontender, nondistended.  Musculoskeletal: able to move all extremities. No joint erythema or tenderness. Bil foot drop noted R>L Skin: Warm and dry. No rash noted. No erythema.  Neurological: Alert and oriented to self Psychiatric:  Appropriate mood and affect.   Labs Reviewed  CBC Latest Ref Rng 11/15/2013 06/04/2013 05/29/2013  WBC - 6.5 6.3 6.7  Hemoglobin 12.0 - 16.0 g/dL 10.3(A) 11.5(A) 11.1(A)  Hematocrit 36 - 46 % 35(A) 38 38  Platelets 150 - 399 K/L 236 243 260    CMP Latest Ref Rng 11/15/2013 06/04/2013 05/29/2013  Glucose 70 - 99 mg/dL - - -  BUN 4 - 21 mg/dL 16(X32(A) 09(U26(A) 04(V26(A)  Creatinine 0.5 - 1.1 mg/dL 0.8 0.7 0.7  Sodium 409137 - 147 mmol/L 144 143 145  Potassium 3.4 - 5.3 mmol/L 4.4 4.1 4.2  Chloride 96 - 112 mEq/L - - -  CO2 19 - 32 mEq/L - - -  Calcium 8.4 - 10.5 mg/dL - - -  Alkaline Phos 25 - 125 U/L - 121 -  AST 13 - 35 U/L - 23 -  ALT 7 - 35 U/L - 21 -    Lab Results  Component Value Date   HGBA1C 5.5 09/27/2013    Lab Results  Component Value Date   TSH 0.59 12/20/2013   Assessment & Plan 1. Essential hypertension, benign Stable. Continue lopressor 12.5mg  twice daily and monitor  2. Constipation, unspecified constipation type Stable. Continue miralax 17g daily and monitor. Encourage hydration  3. Dementia without behavioral disturbance Stable. Continue exelon patch 13.3mg  daily. Continue to monitor for change in behavior. Continue assist with ADL care. Continue fall and pressure ulcer precautions.   4. Old CVA Neurologically stable. Currently not on any medications. Will check lipid panel and initiate treatment as indicated. Monitor clinically   5. Anemia, unspecified anemia type Recent hct/hgb 10.3/34.8. Will check folate, b12, and iron panel. Continue to monitor   6. DM2-diet-controlled A1C 5.5 in 8/15. Last monthly CBG 103. Continue monthly cbg check. Will check a1c and urine microalbumin creatinine ratio. Will add patient to podiatry and opthalmology's list. Continue to monitor her status  Diagnostic Studies/Labs Ordered: lipid, folate, b12, iron panel, A1C, urine microalbumin/creatine ratio  Family/Staff Communication Plan of care discussed with resident and nursing  staff. Resident and nursing staff verbalized understanding and agree with plan of care. No additional questions or concerns reported.    Loura BackKim Altariq Goodall, MSN, AGNP-C [facility] [address] [place], [place] [address] [phone] [8am-5pm] After hours: [phone]

## 2014-03-27 LAB — BASIC METABOLIC PANEL
BUN: 28 mg/dL — AB (ref 4–21)
Creatinine: 0.8 mg/dL (ref 0.5–1.1)
Potassium: 4.6 mmol/L (ref 3.4–5.3)
Sodium: 146 mmol/L (ref 137–147)

## 2014-03-27 LAB — CBC AND DIFFERENTIAL
HCT: 35 % — AB (ref 36–46)
HEMOGLOBIN: 10.9 g/dL — AB (ref 12.0–16.0)
PLATELETS: 175 10*3/uL (ref 150–399)
WBC: 6.6 10*3/mL

## 2014-03-27 LAB — LIPID PANEL
Cholesterol: 189 mg/dL (ref 0–200)
HDL: 66 mg/dL (ref 35–70)
LDL Cholesterol: 133 mg/dL
Triglycerides: 66 mg/dL (ref 40–160)

## 2014-03-27 LAB — HEMOGLOBIN A1C: Hgb A1c MFr Bld: 6 % (ref 4.0–6.0)

## 2014-04-21 LAB — HM DIABETES EYE EXAM

## 2014-04-23 ENCOUNTER — Non-Acute Institutional Stay (SKILLED_NURSING_FACILITY): Payer: 59 | Admitting: Registered Nurse

## 2014-04-23 DIAGNOSIS — E119 Type 2 diabetes mellitus without complications: Secondary | ICD-10-CM | POA: Diagnosis not present

## 2014-04-23 DIAGNOSIS — N3000 Acute cystitis without hematuria: Secondary | ICD-10-CM

## 2014-04-23 DIAGNOSIS — E785 Hyperlipidemia, unspecified: Secondary | ICD-10-CM | POA: Diagnosis not present

## 2014-04-23 DIAGNOSIS — I10 Essential (primary) hypertension: Secondary | ICD-10-CM | POA: Diagnosis not present

## 2014-04-23 DIAGNOSIS — Z8673 Personal history of transient ischemic attack (TIA), and cerebral infarction without residual deficits: Secondary | ICD-10-CM | POA: Diagnosis not present

## 2014-04-23 DIAGNOSIS — K5900 Constipation, unspecified: Secondary | ICD-10-CM | POA: Diagnosis not present

## 2014-04-23 DIAGNOSIS — F0390 Unspecified dementia without behavioral disturbance: Secondary | ICD-10-CM

## 2014-04-23 DIAGNOSIS — D649 Anemia, unspecified: Secondary | ICD-10-CM | POA: Diagnosis not present

## 2014-04-24 NOTE — Progress Notes (Signed)
Patient ID: Sylvia Medina, female   DOB: [birth year], 79 y.o.   MRN: [id number]   Place of Service: [facility]  No Known Allergies  Code Status: Full Code  Goals of Care: Longevity/LTC  Chief Complaint  Patient presents with  . Medical Management of Chronic Issues    DM2, old CVA, dementia, constipation, anemia    HPI 79 y.o. female with PMH of old CVA, dementia, anemia, DM2 among others is being seen for a routine visit for management of her chronic issues. Weight stable. No recent fall or skin concerns reported. No change in behaviors or functional status reported. 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 No concerns from staff. Dementia stable on exelon patch. BP well controlled with range from high 90s-140s/50-80s. Constipation stable on miralax. Anemia improved with most recent hgb/hct 10.9/35. Seen in room today. No concerns verbalized from patient   Review of Systems Constitutional: Negative for fever, chills, and fatigue. HENT: Negative for ear pain, congestion, and sore throat Eyes: Negative for eye pain and eye discharge Cardiovascular: Negative for chest pain and leg swelling Respiratory: Negative cough and shortness of breath  Gastrointestinal: Negative for nausea and vomiting. Negative for abdominal pain Genitourinary: Negative for dysuria and hematuria Musculoskeletal: Negative for back pain  Neurological: Negative for dizziness and headache.  Skin: Negative for rash and wound.   Psychiatric: Negative for depression  Past Medical History  Diagnosis Date  . Dementia 07/04/2012  . Anemia 07/04/2012  . Unspecified constipation 07/04/2012  . Unspecified vitamin D deficiency 07/04/2012  . Diabetes mellitus type 2, diet-controlled 07/04/2012  . CVA (cerebral vascular accident) 07/04/2012  . Unspecified hypothyroidism 07/04/2012  . Cardiac arrhythmia 07/04/2012    No past surgical history on file.  History   Social History  .  Marital Status: Widowed    Spouse Name: N/A  . Number of Children: N/A  . Years of Education: N/A   Occupational History  . Not on file.   Social History Main Topics  . Smoking status: Never Smoker   . Smokeless tobacco: Not on file  . Alcohol Use: No  . Drug Use: No  . Sexual Activity: Not Currently   Other Topics Concern  . Not on file   Social History Narrative    No family history on file.    Medication List       This list is accurate as of: 04/23/14 11:59 PM.  Always use your most recent med list.               atorvastatin 10 MG tablet  Commonly known as:  LIPITOR  Take 10 mg by mouth daily.     metoprolol tartrate 25 MG tablet  Commonly known as:  LOPRESSOR  Take 12.5 mg by mouth 2 (two) times daily.     polyethylene glycol powder powder  Commonly known as:  GLYCOLAX/MIRALAX  Take 17 g by mouth daily.     Rivastigmine 13.3 MG/24HR Pt24  Commonly known as:  EXELON  Place 1 patch (13.3 mg total) onto the skin daily.     therapeutic multivitamin-minerals tablet  Take 1 tablet by mouth daily.        Physical Exam  BP 107/69 mmHg  Pulse 64  Temp(Src) 98.4 F (36.9 C)  Resp 18  Ht  (1.651 m)  Wt 238 lb 6.4 oz (108.138 kg)  BMI 39.67 kg/m2  SpO2 95%  Constitutional: Obese elderly female in no acute distress. Conversant  and pleasant HEENT: Normocephalic and atraumatic. PERRL. EOM intact. No scleral icterus.  No nasal discharge or sinus tenderness. Oral mucosa moist. Posterior pharynx clear of any exudate or lesions.  Neck: No lymphadenopathy, masses, or thyromegaly. No JVD or carotid bruits. Cardiac: Normal S1, S2. RRR with 1/6 systolic murmur. Distal pulses intact.Trace dependent edema Lungs: No respiratory distress. Breath sounds clear bilaterally without rales, rhonchi, or wheezes. Abdomen: Audible bowel sounds in all quadrants. Soft, nontender, nondistended.  Musculoskeletal: Bedbound. Able to move all extremities. Limited ROM throughout  with generalized weakness. Bil foot drop noted R>L Skin: Warm and dry. No rash noted. No erythema.  Neurological: Alert and oriented to self Psychiatric: Appropriate mood and affect.   Labs Reviewed  CBC Latest Ref Rng 03/27/2014 11/15/2013 06/04/2013  WBC - 6.6 6.5 6.3  Hemoglobin 12.0 - 16.0 g/dL 10.9(A) 10.3(A) 11.5(A)  Hematocrit 36 - 46 % 35(A) 35(A) 38  Platelets 150 - 399 K/L 175 236 243    CMP Latest Ref Rng 03/27/2014 11/15/2013 06/04/2013  Glucose 70 - 99 mg/dL - - -  BUN 4 - 21 mg/dL 16(X28(A) 09(U32(A) 04(V26(A)  Creatinine 0.5 - 1.1 mg/dL 0.8 0.8 0.7  Sodium 409137 - 147 mmol/L 146 144 143  Potassium 3.4 - 5.3 mmol/L 4.6 4.4 4.1  Chloride 96 - 112 mEq/L - - -  CO2 19 - 32 mEq/L - - -  Calcium 8.4 - 10.5 mg/dL - - -  Alkaline Phos 25 - 125 U/L - - 121  AST 13 - 35 U/L - - 23  ALT 7 - 35 U/L - - 21    Lab Results  Component Value Date   HGBA1C 6.0 03/27/2014    Lab Results  Component Value Date   TSH 0.59 12/20/2013   Assessment & Plan 1. Essential hypertension, benign Stable. BP range high 90s-140s/50-80s. Continue lopressor 12.5mg  twice daily and monitor  2. Constipation, unspecified constipation type No issues. Continue miralax 17g daily and monitor. Encourage hydration and OOB to chair as tolerated.   3. Dementia without behavioral disturbance Stable. Continue exelon patch 13.3mg  daily. Continue to monitor for change in behavior. Continue assist with ADL care. Continue fall and pressure ulcer precautions.   4. S/p CVA Neurologically stable. Monitor clinically. Start on statin today for HLD.   5. Anemia of chronic disease Stable. Recent hct/hgb 10.9/35. Continue to monitor h&h  6. DM2-diet-controlled Recent a1c 6.0. Continue monthly cbg check. Eye and foot exam are up to date. Repeat microalbumin creatinine ratio r/t to inadequately sample size. Continue to monitor her status  7. HLD LDL 133. Start lipitor 10mg  daily and monitor.   8. Cystitis Urine culture  positive for Morganella morganii. Continue and complete levaquin 500mg  daily for 6 more days. Continue to monitor her status. Encourage hydration.   Diagnostic Studies/Labs Ordered: urine microalbumin/creatine ratio  Family/Staff Communication Plan of care discussed with resident and nursing staff. Resident and nursing staff verbalized understanding and agree with plan of care. No additional questions or concerns reported.    Loura BackKim Decklan Mau, MSN, AGNP-C [facility] [address] [place], [place] [address] [phone] [8am-5pm] After hours: [phone]

## 2014-05-23 ENCOUNTER — Non-Acute Institutional Stay: Payer: 59 | Admitting: Registered Nurse

## 2014-05-23 ENCOUNTER — Encounter: Payer: Self-pay | Admitting: Registered Nurse

## 2014-05-23 DIAGNOSIS — I10 Essential (primary) hypertension: Secondary | ICD-10-CM | POA: Diagnosis not present

## 2014-05-23 DIAGNOSIS — E119 Type 2 diabetes mellitus without complications: Secondary | ICD-10-CM | POA: Diagnosis not present

## 2014-05-23 DIAGNOSIS — D638 Anemia in other chronic diseases classified elsewhere: Secondary | ICD-10-CM | POA: Diagnosis not present

## 2014-05-23 DIAGNOSIS — E785 Hyperlipidemia, unspecified: Secondary | ICD-10-CM | POA: Diagnosis not present

## 2014-05-23 DIAGNOSIS — K5900 Constipation, unspecified: Secondary | ICD-10-CM | POA: Diagnosis not present

## 2014-05-23 DIAGNOSIS — Z8673 Personal history of transient ischemic attack (TIA), and cerebral infarction without residual deficits: Secondary | ICD-10-CM | POA: Diagnosis not present

## 2014-05-23 DIAGNOSIS — F0390 Unspecified dementia without behavioral disturbance: Secondary | ICD-10-CM

## 2014-05-23 NOTE — Progress Notes (Signed)
Patient ID: Sylvia Medina, female   DOB: 02-24-1935, 79 y.o.   MRN: [id number]   Place of Service: [facility]  No Known Allergies  Code Status: Full Code  Goals of Care: Longevity/LTC  Chief Complaint  Patient presents with  . Medical Management of Chronic Issues    old cva, dm2, dementia, constipation, htn, hld, anemia    HPI 79 y.o. female with PMH of old CVA, dementia, anemia, DM2 among others is being seen for a routine visit for management of her chronic issues. Weight stable. No recent fall or skin concerns reported. No change in behaviors or functional status reported. No concerns from staff. Dementia stable on exelon patch. HTN adequately controlled with BP mostly 120-140s/50-70s. Monthly cbgs 124. No issues with constipation on miralax. Seen in room today. No concerns verbalized from patient   Review of Systems Constitutional: Negative for fever, chills, and fatigue. HENT: Negative for ear pain, congestion, and sore throat Eyes: Negative for eye pain and eye discharge Cardiovascular: Negative for chest pain and leg swelling Respiratory: Negative cough and shortness of breath  Gastrointestinal: Negative for nausea and vomiting. Negative for abdominal pain Genitourinary: Negative for dysuria and hematuria Musculoskeletal: Negative for back pain  Neurological: Negative for dizziness and headache.  Skin: Negative for rash and wound.   Psychiatric: Negative for depression  Past Medical History  Diagnosis Date  . Dementia 07/04/2012  . Anemia 07/04/2012  . Unspecified constipation 07/04/2012  . Unspecified vitamin D deficiency 07/04/2012  . Diabetes mellitus type 2, diet-controlled 07/04/2012  . CVA (cerebral vascular accident) 07/04/2012  . Unspecified hypothyroidism 07/04/2012  . Cardiac arrhythmia 07/04/2012    No past surgical history on file.  History   Social History  . Marital Status: Widowed    Spouse Name: N/A  . Number of Children: N/A  . Years  of Education: N/A   Occupational History  . Not on file.   Social History Main Topics  . Smoking status: Never Smoker   . Smokeless tobacco: Not on file  . Alcohol Use: No  . Drug Use: No  . Sexual Activity: Not Currently   Other Topics Concern  . Not on file   Social History Narrative      Medication List       This list is accurate as of: 05/23/14  1:15 PM.  Always use your most recent med list.               atorvastatin 10 MG tablet  Commonly known as:  LIPITOR  Take 10 mg by mouth daily.     ferrous sulfate 325 (65 FE) MG tablet  Take 325 mg by mouth 2 (two) times daily with a meal.     metoprolol tartrate 25 MG tablet  Commonly known as:  LOPRESSOR  Take 12.5 mg by mouth 2 (two) times daily.     polyethylene glycol powder powder  Commonly known as:  GLYCOLAX/MIRALAX  Take 17 g by mouth daily.     Rivastigmine 13.3 MG/24HR Pt24  Commonly known as:  EXELON  Place 1 patch (13.3 mg total) onto the skin daily.     therapeutic multivitamin-minerals tablet  Take 1 tablet by mouth daily.        Physical Exam  BP 127/60 mmHg  Pulse 67  Temp(Src) 97.6 F (36.4 C)  Resp 16  Ht  (1.651 m)  Wt 238 lb 6.4 oz (108.138 kg)  BMI 39.67 kg/m2  SpO2 98%  Constitutional: Obese elderly female in no acute distress. Conversant and pleasant HEENT: Normocephalic and atraumatic. PERRL. EOM intact. No scleral icterus.  No nasal discharge or sinus tenderness. Oral mucosa moist. Posterior pharynx clear of any exudate or lesions.  Neck: No lymphadenopathy, masses, or thyromegaly. No JVD or carotid bruits. Cardiac: Normal S1, S2. RRR with 1/6 systolic murmur. Distal pulses intact.Trace dependent edema Lungs: No respiratory distress. Breath sounds clear bilaterally without rales, rhonchi, or wheezes. Abdomen: Audible bowel sounds in all quadrants. Soft, nontender, nondistended.  Musculoskeletal: Bedbound. Able to move all extremities. Limited ROM throughout with  generalized weakness. Bil foot drop noted R>L Skin: Warm and dry. No rash noted. No erythema.  Neurological: Alert and oriented to self Psychiatric: Appropriate mood and affect.   Labs Reviewed  CBC Latest Ref Rng 03/27/2014 11/15/2013 06/04/2013  WBC - 6.6 6.5 6.3  Hemoglobin 12.0 - 16.0 g/dL 10.9(A) 10.3(A) 11.5(A)  Hematocrit 36 - 46 % 35(A) 35(A) 38  Platelets 150 - 399 K/L 175 236 243    CMP Latest Ref Rng 03/27/2014 11/15/2013 06/04/2013  Glucose 70 - 99 mg/dL - - -  BUN 4 - 21 mg/dL 16(X28(A) 09(U32(A) 04(V26(A)  Creatinine 0.5 - 1.1 mg/dL 0.8 0.8 0.7  Sodium 409137 - 147 mmol/L 146 144 143  Potassium 3.4 - 5.3 mmol/L 4.6 4.4 4.1  Chloride 96 - 112 mEq/L - - -  CO2 19 - 32 mEq/L - - -  Calcium 8.4 - 10.5 mg/dL - - -  Alkaline Phos 25 - 125 U/L - - 121  AST 13 - 35 U/L - - 23  ALT 7 - 35 U/L - - 21    Lab Results  Component Value Date   HGBA1C 6.0 03/27/2014    Lab Results  Component Value Date   TSH 0.59 12/20/2013   Lipid Panel     Component Value Date/Time   CHOL 189 03/27/2014   TRIG 66 03/27/2014   HDL 66 03/27/2014   LDLCALC 133 03/27/2014   Assessment & Plan 1. Essential hypertension, benign Stable. Continue lopressor 12.5mg  twice daily and monitor  2. Constipation, unspecified constipation type Stable. Continue miralax 17g daily and monitor. Encourage hydration and OOB to chair as tolerated.   3. Dementia without behavioral disturbance Stable. Decline anticipated. Continue exelon patch 13.3mg  daily. Continue to monitor for change in behavior. Continue assist with ADL care and fall/pressure ulcer precautions.   4. S/p CVA Neurologically stable. Monitor clinically. Continue statin  5. Anemia of chronic disease Stable. Recent hct/hgb 10.9/35. Start ferrous sulfate 325mg  twice daily. Recheck cbc in 6 weeks. Continue to monitor h&h  6. DM2-diet-controlled Recent a1c 6.0. Monthly cbg 124. Eye and foot exam are up to date. Continue monthly cbg check and monitor   7.  HLD LDL 133. Continue lipitor 10mg  daily and monitor.   Diagnostic Studies/Labs Ordered: cbc in 6 weeks  Family/Staff Communication Plan of care discussed with resident and nursing staff. Resident and nursing staff verbalized understanding and agree with plan of care. No additional questions or concerns reported.    Loura BackKim Khyleigh Furney, MSN, AGNP-C [facility] [address] [place], [place] [address] [phone] [8am-5pm] After hours: [phone]

## 2014-07-01 ENCOUNTER — Encounter: Payer: Self-pay | Admitting: Registered Nurse

## 2014-07-01 ENCOUNTER — Non-Acute Institutional Stay (SKILLED_NURSING_FACILITY): Payer: 59 | Admitting: Registered Nurse

## 2014-07-01 DIAGNOSIS — E119 Type 2 diabetes mellitus without complications: Secondary | ICD-10-CM | POA: Diagnosis not present

## 2014-07-01 DIAGNOSIS — I10 Essential (primary) hypertension: Secondary | ICD-10-CM

## 2014-07-01 DIAGNOSIS — F0390 Unspecified dementia without behavioral disturbance: Secondary | ICD-10-CM | POA: Diagnosis not present

## 2014-07-01 DIAGNOSIS — D638 Anemia in other chronic diseases classified elsewhere: Secondary | ICD-10-CM

## 2014-07-01 DIAGNOSIS — Z8673 Personal history of transient ischemic attack (TIA), and cerebral infarction without residual deficits: Secondary | ICD-10-CM

## 2014-07-01 DIAGNOSIS — K5901 Slow transit constipation: Secondary | ICD-10-CM | POA: Diagnosis not present

## 2014-07-01 DIAGNOSIS — Z0000 Encounter for general adult medical examination without abnormal findings: Secondary | ICD-10-CM

## 2014-07-01 DIAGNOSIS — E785 Hyperlipidemia, unspecified: Secondary | ICD-10-CM

## 2014-07-01 NOTE — Progress Notes (Signed)
Patient ID: Sylvia Medina, female   DOB: 21-Sep-1935, 79 y.o.   MRN: [id number]   Place of Service: [facility]  No Known Allergies  Code Status: Full Code  Goals of Care: Longevity/LTC  Chief Complaint  Patient presents with  . Annual Exam    HPI 79 y.o. female with PMH of old CVA, dementia, anemia, DM2 among others is being seen for a annual health exam and routine visit for management of her chronic issues. Weight stable over the past 30 days. No recent fall or skin concerns reported. No change in behaviors or functional status reported. No concerns from staff. She is up to date with influenza and pneumonia vaccine. Dementia stable with decline anticipated. HTN adequately controlled with BP mostly 130s/60-70s. Monthly cbg 102. Constipation stable on on miralax. Seen in room today. No concerns verbalized from patient   Review of Systems Constitutional: Negative for fever, chills, and fatigue. HENT: Negative for ear pain, congestion, and sore throat Eyes: Negative for eye pain and eye discharge Cardiovascular: Negative for chest pain and leg swelling Respiratory: Negative cough and shortness of breath  Gastrointestinal: Negative for nausea and vomiting. Negative for abdominal pain Genitourinary: Negative for dysuria and hematuria Musculoskeletal: Negative for back pain  Neurological: Negative for dizziness and headache.  Skin: Negative for rash and wound.   Psychiatric: Negative for depression  Past Medical History  Diagnosis Date  . Dementia 07/04/2012  . Anemia 07/04/2012  . Unspecified constipation 07/04/2012  . Unspecified vitamin D deficiency 07/04/2012  . Diabetes mellitus type 2, diet-controlled 07/04/2012  . CVA (cerebral vascular accident) 07/04/2012  . Unspecified hypothyroidism 07/04/2012  . Cardiac arrhythmia 07/04/2012    No past surgical history on file.  History   Social History  . Marital Status: Widowed    Spouse Name: N/A  . Number of Children:  N/A  . Years of Education: N/A   Occupational History  . Not on file.   Social History Main Topics  . Smoking status: Never Smoker   . Smokeless tobacco: Not on file  . Alcohol Use: No  . Drug Use: No  . Sexual Activity: Not Currently   Other Topics Concern  . Not on file   Social History Narrative      Medication List       This list is accurate as of: 07/01/14  4:12 PM.  Always use your most recent med list.               atorvastatin 10 MG tablet  Commonly known as:  LIPITOR  Take 10 mg by mouth daily.     ferrous sulfate 325 (65 FE) MG tablet  Take 325 mg by mouth 2 (two) times daily with a meal.     metoprolol tartrate 25 MG tablet  Commonly known as:  LOPRESSOR  Take 12.5 mg by mouth 2 (two) times daily.     polyethylene glycol powder powder  Commonly known as:  GLYCOLAX/MIRALAX  Take 17 g by mouth daily.     Rivastigmine 13.3 MG/24HR Pt24  Commonly known as:  EXELON  Place 1 patch (13.3 mg total) onto the skin daily.     therapeutic multivitamin-minerals tablet  Take 1 tablet by mouth daily.        Physical Exam  BP 139/63 mmHg  Pulse 63  Temp(Src) 98.2 F (36.8 C)  Resp 16  Ht 5\' 5"  (1.651 m)  Wt 239 lb 6.4 oz (108.591 kg)  BMI 39.84 kg/m2  SpO2 95%  Constitutional: Obese elderly female in no acute distress. Conversant and pleasant HEENT: Normocephalic and atraumatic. PERRL. EOM intact. No scleral icterus.  No nasal discharge or sinus tenderness. Oral mucosa moist. Posterior pharynx clear of any exudate or lesions.  Neck: No lymphadenopathy, masses, or thyromegaly. No JVD or carotid bruits. Cardiac: Normal S1, S2. RRR with 1/6 systolic murmur. Distal pulses intact.Trace dependent edema Lungs: No respiratory distress. Breath sounds clear bilaterally without rales, rhonchi, or wheezes. Abdomen: Audible bowel sounds in all quadrants. Soft, nontender, nondistended.  Musculoskeletal: Bedbound. Able to move all extremities. Limited ROM  throughout with generalized weakness. Bil foot drop noted R>L Skin: Warm and dry. No rash noted. No erythema.  Neurological: Alert and oriented to self and place Psychiatric: Appropriate mood and affect.   Labs Reviewed  CBC Latest Ref Rng 03/27/2014 11/15/2013 06/04/2013  WBC - 6.6 6.5 6.3  Hemoglobin 12.0 - 16.0 g/dL 10.9(A) 10.3(A) 11.5(A)  Hematocrit 36 - 46 % 35(A) 35(A) 38  Platelets 150 - 399 K/L 175 236 243    CMP Latest Ref Rng 03/27/2014 11/15/2013 06/04/2013  Glucose 70 - 99 mg/dL - - -  BUN 4 - 21 mg/dL 16(X28(A) 09(U32(A) 04(V26(A)  Creatinine 0.5 - 1.1 mg/dL 0.8 0.8 0.7  Sodium 409137 - 147 mmol/L 146 144 143  Potassium 3.4 - 5.3 mmol/L 4.6 4.4 4.1  Chloride 96 - 112 mEq/L - - -  CO2 19 - 32 mEq/L - - -  Calcium 8.4 - 10.5 mg/dL - - -  Alkaline Phos 25 - 125 U/L - - 121  AST 13 - 35 U/L - - 23  ALT 7 - 35 U/L - - 21    Lab Results  Component Value Date   HGBA1C 6.0 03/27/2014    Lab Results  Component Value Date   TSH 0.59 12/20/2013   Lipid Panel     Component Value Date/Time   CHOL 189 03/27/2014   TRIG 66 03/27/2014   HDL 66 03/27/2014   LDLCALC 133 03/27/2014   Assessment & Plan 1. Essential hypertension, benign Stable. Continue lopressor 12.5mg  twice daily and monitor bp  2. Constipation No issues. Continue miralax 17g daily. Encourage hydration.   3. Dementia without behavioral disturbance Stable. Decline anticipated. Continue exelon patch 13.3mg  daily. Monitor for change in behavior. Continue assist with ADL care. Pressure ulcer precautions   4. S/p CVA Neurologically stable. Continue lipitor. Monitor clinically  5. Anemia of chronic disease Last hct/hgb 10.9/35. Continue ferrous sulfate 325mg  twice daily. Recheck cbc. Continue to monitor h&h  6. DM2-diet-controlled Recent a1c 6.0. Monthly cbg 102. Eye and foot exam are up to date. Continue monthly cbg check  7. HLD LDL 133. Continue lipitor 10mg  daily and monitor.   8. Annual exam Is up to date  with her influenza and pneumonia vaccines. Not a candidate for mammography or DEXA scan. FOBT x3 and annually in May for colon can screening. Recheck cbc, bmp, and tsh. Continue to offer resident with recommended immunizations and screenings as appropriate for age and health status.   Diagnostic Studies/Labs Ordered: cbc, bmp, tsh  Family/Staff Communication Plan of care discussed with resident and nursing staff. Resident and nursing staff verbalized understanding and agree with plan of care. No additional questions or concerns reported.    Sylvia BackKim Makella Buckingham, MSN, AGNP-C [facility] [address] [place], [place] [address] [phone] [8am-5pm] After hours: [phone]

## 2014-08-11 ENCOUNTER — Non-Acute Institutional Stay: Payer: 59 | Admitting: Nurse Practitioner

## 2014-08-11 DIAGNOSIS — I10 Essential (primary) hypertension: Secondary | ICD-10-CM | POA: Diagnosis not present

## 2014-08-11 DIAGNOSIS — F0390 Unspecified dementia without behavioral disturbance: Secondary | ICD-10-CM

## 2014-08-11 DIAGNOSIS — D638 Anemia in other chronic diseases classified elsewhere: Secondary | ICD-10-CM | POA: Diagnosis not present

## 2014-08-11 DIAGNOSIS — E785 Hyperlipidemia, unspecified: Secondary | ICD-10-CM

## 2014-08-11 DIAGNOSIS — M79674 Pain in right toe(s): Secondary | ICD-10-CM

## 2014-08-11 DIAGNOSIS — K5900 Constipation, unspecified: Secondary | ICD-10-CM | POA: Diagnosis not present

## 2014-08-12 NOTE — Progress Notes (Signed)
Patient ID: Sylvia Medina, female   DOB: Apr 06, 1935, 79 y.o.   MRN: [id number]    Nursing Home Location:  [place] of Service: SNF (31)  PCP: Blanchie Serve, MD  No Known Allergies  Chief Complaint  Patient presents with  . Medical Management of Chronic Issues    HPI:  Patient is a 79 y.o. female seen today at [facility] for routine follow up and painful right great toe. Pt with a pmh of old CVA, dementia, anemia, DM2 Staff notes painful great toe and second toe. Pt reports only painful when being touched, unsure of how long it has been going on. Reports throbbing pain. No numbness or tingling. No injury noted, does not get oob.   Moving bowels without problems, no other reports of pain. No chest pains or shortness of breath  Review of Systems:  Review of Systems  Constitutional: Negative for activity change, appetite change, fatigue and unexpected weight change.  HENT: Negative for congestion and hearing loss.   Eyes: Negative.   Respiratory: Negative for cough and shortness of breath.   Cardiovascular: Negative for chest pain, palpitations and leg swelling.  Gastrointestinal: Negative for abdominal pain, diarrhea and constipation.  Genitourinary: Negative for dysuria and difficulty urinating.  Musculoskeletal: Positive for arthralgias (to right foot ). Negative for myalgias.  Skin: Negative.  Negative for color change and wound.  Neurological: Negative for dizziness, weakness and headaches.  Psychiatric/Behavioral: Negative for behavioral problems, confusion and agitation.       Memory loss    Past Medical History  Diagnosis Date  . Dementia 07/04/2012  . Anemia 07/04/2012  . Unspecified constipation 07/04/2012  . Unspecified vitamin D deficiency 07/04/2012  . Diabetes mellitus type 2, diet-controlled 07/04/2012  . CVA (cerebral vascular accident) 07/04/2012  . Unspecified hypothyroidism 07/04/2012  . Cardiac arrhythmia  07/04/2012   No past surgical history on file. Social History:   reports that she has never smoked. She does not have any smokeless tobacco history on file. She reports that she does not drink alcohol or use illicit drugs.  No family history on file.  Medications: Patient's Medications  New Prescriptions   No medications on file  Previous Medications   ATORVASTATIN (LIPITOR) 10 MG TABLET    Take 10 mg by mouth daily.   FERROUS SULFATE 325 (65 FE) MG TABLET    Take 325 mg by mouth 2 (two) times daily with a meal.   METOPROLOL TARTRATE (LOPRESSOR) 25 MG TABLET    Take 12.5 mg by mouth 2 (two) times daily.   POLYETHYLENE GLYCOL POWDER (GLYCOLAX/MIRALAX) POWDER    Take 17 g by mouth daily.   RIVASTIGMINE (EXELON) 13.3 MG/24HR PT24    Place 1 patch (13.3 mg total) onto the skin daily.   THERAPEUTIC MULTIVITAMIN-MINERALS (THERAGRAN-M) TABLET    Take 1 tablet by mouth daily.  Modified Medications   No medications on file  Discontinued Medications   No medications on file     Physical Exam: Filed Vitals:   08/11/14 1510  BP: 122/65  Pulse: 77  Temp: 97.5 F (36.4 C)  Resp: 20    Physical Exam  Constitutional: She appears well-developed and well-nourished. No distress.  HENT:  Head: Normocephalic and atraumatic.  Mouth/Throat: Oropharynx is clear and moist. No oropharyngeal exudate.  Eyes: Conjunctivae are normal. Pupils are equal, round, and reactive to light.  Neck: Normal range of motion. Neck supple.  Cardiovascular: Normal rate, regular  rhythm and normal heart sounds.   Pulmonary/Chest: Effort normal and breath sounds normal.  Abdominal: Soft. Bowel sounds are normal.  Musculoskeletal: She exhibits no edema or tenderness.  Painful toes to touch, no swelling, redness or heat   Neurological: She is alert.  Skin: Skin is warm and dry. She is not diaphoretic.  Psychiatric: She has a normal mood and affect.    Labs reviewed: Basic Metabolic Panel:  Recent Labs   11/15/13 03/27/14  NA 144 146  K 4.4 4.6  BUN 32* 28*  CREATININE 0.8 0.8   Liver Function Tests: No results for input(s): AST, ALT, ALKPHOS, BILITOT, PROT, ALBUMIN in the last 8760 hours. No results for input(s): LIPASE, AMYLASE in the last 8760 hours. No results for input(s): AMMONIA in the last 8760 hours. CBC:  Recent Labs  11/15/13 03/27/14  WBC 6.5 6.6  HGB 10.3* 10.9*  HCT 35* 35*  PLT 236 175   TSH:  Recent Labs  12/20/13  TSH 0.59   A1C: Lab Results  Component Value Date   HGBA1C 6.0 03/27/2014   Lipid Panel:  Recent Labs  03/27/14  CHOL 189  HDL 66  LDLCALC 133  TRIG 66    Result Date: 07/02/14 11:59 AM      Analyte   Result Value   Ref. Range    Units   Out of Range   Lab  WBC  7.0  4.0-10.5  K/uL    SLN  RBC  4.29  3.87-5.11  MIL/uL      Hemoglobin  10.9  12.0-15.0  g/dL  L    Hematocrit  34.5  36.0-46.0  %  L    MCV  80.4  78.0-100.0  fL      MCH  25.4  26.0-34.0  pg  L    MCHC  31.6  30.0-36.0  g/dL      RDW  16.4  11.5-15.5  %  H    Platelet Count  214  150-400  K/uL      MPV  10.6  8.6-12.4  fL      Granulocyte %  56  43-77  %      Absolute Gran  3.9  1.7-7.7  K/uL      Lymph %  36  12-46  %      Absolute Lymph  2.5  0.7-4.0  K/uL      Mono %  6  3-12  %      Absolute Mono  0.4  0.1-1.0  K/uL      Eos %  2  0-5  %      Absolute Eos  0.1  0.0-0.7  K/uL      Baso %  0  0-1  %      Absolute Baso  0.0  0.0-0.1  K/uL      Smear Review  Criteria for review not met          Basic Metabolic Panel  Status: Final Out of Range  Result Date: 07/02/14 11:59 AM      Analyte   Result Value   Ref. Range    Units   Out of Range   Lab  Sodium  143  135-145  mEq/L    SLN  Potassium  4.3  3.5-5.3  mEq/L      Chloride  114  96-112  mEq/L  H    CO2  22  19-32  mEq/L      Glucose  85  70-99  mg/dL      BUN  30  6-23  mg/dL  H    Creatinine  0.87  0.50-1.10  mg/dL      Calcium  8.5  8.4-10.5  mg/dL      TSH  Status: Final   Result Date: 07/02/14  11:59 AM      Analyte   Result Value   Ref. Range    Units   Out of Range   Lab  TSH  0.481  0.350-4.500  uIU/mL   Assessment/Plan  1. Essential hypertension, benign - stable, conts lopressor 12.5 mg twice daily  2. Anemia of chronic disease Stable on recent labs, conts iron  3. Dementia, without behavioral disturbance No acute decline, conts on exelon patch  4. Constipation, unspecified constipation type Well controlled on current regimen   5. HLD (hyperlipidemia)  conts on  Lipitor daily   6.  Pain of toe of right foot -no signs of injury, inflammation or infection -will start tylenol 650 mg BID and have staff to provide ongoing monitoring, notify as needed    Janett Billow K. Harle Battiest  [facility] [phone] 8 am - 5 pm) [phone] (after hours)

## 2014-09-18 ENCOUNTER — Non-Acute Institutional Stay (SKILLED_NURSING_FACILITY): Payer: 59 | Admitting: Internal Medicine

## 2014-09-18 DIAGNOSIS — I10 Essential (primary) hypertension: Secondary | ICD-10-CM | POA: Diagnosis not present

## 2014-09-18 DIAGNOSIS — G309 Alzheimer's disease, unspecified: Secondary | ICD-10-CM

## 2014-09-18 DIAGNOSIS — R635 Abnormal weight gain: Secondary | ICD-10-CM | POA: Diagnosis not present

## 2014-09-18 DIAGNOSIS — E119 Type 2 diabetes mellitus without complications: Secondary | ICD-10-CM | POA: Diagnosis not present

## 2014-09-18 DIAGNOSIS — F0280 Dementia in other diseases classified elsewhere without behavioral disturbance: Secondary | ICD-10-CM

## 2014-09-18 DIAGNOSIS — D638 Anemia in other chronic diseases classified elsewhere: Secondary | ICD-10-CM

## 2014-09-18 DIAGNOSIS — E785 Hyperlipidemia, unspecified: Secondary | ICD-10-CM

## 2014-09-18 NOTE — Progress Notes (Signed)
Patient ID: Sylvia Medina, female   DOB: 08-11-1935, 79 y.o.   MRN: [id number]     Facility: [facility]   Chief Complaint  Patient presents with  . Medical Management of Chronic Issues   No Known Allergies   Code status: full code  HPI 79 y/o female pt is seen today for routine visit. She is lying on her bed, she is in no distress and denies any concerns. No concerns from nursing saff. On review of her vital signs, her SBP has been elevated and she has gained weight. she has PMH of CVA, HTN, DM (diet controlled) and constipation among others. She has dementia and is pleasantly confused. No falls reported. No new skin concern.  ROS Constitutional: Negative for fever, chills, malaise/fatigue and diaphoresis.  Respiratory: Negative for cough, shortness of breath and wheezing.   Cardiovascular: Negative for chest pain, palpitations Gastrointestinal: Negative for heartburn, nausea, vomiting, abdominal pain Genitourinary: Negative for dysuria  Musculoskeletal: Negative for back pain, falls Skin: Negative for itching and rash.  Neurological: Negative for weakness,dizziness,and headaches.  Psychiatric/Behavioral: The patient is not nervous/anxious  Past Medical History  Diagnosis Date  . Dementia 07/04/2012  . Anemia 07/04/2012  . Unspecified constipation 07/04/2012  . Unspecified vitamin D deficiency 07/04/2012  . Diabetes mellitus type 2, diet-controlled 07/04/2012  . CVA (cerebral vascular accident) 07/04/2012  . Unspecified hypothyroidism 07/04/2012  . Cardiac arrhythmia 07/04/2012   No past surgical history on file.   Medication List       This list is accurate as of: 09/18/14  4:43 PM.  Always use your most recent med list.               atorvastatin 10 MG tablet  Commonly known as:  LIPITOR  Take 10 mg by mouth daily.     ferrous sulfate 325 (65 FE) MG tablet  Take 325 mg by mouth 2 (two) times daily with a meal.     metoprolol tartrate 25  MG tablet  Commonly known as:  LOPRESSOR  Take 12.5 mg by mouth 2 (two) times daily.     polyethylene glycol powder powder  Commonly known as:  GLYCOLAX/MIRALAX  Take 17 g by mouth daily.     Rivastigmine 13.3 MG/24HR Pt24  Commonly known as:  EXELON  Place 1 patch (13.3 mg total) onto the skin daily.     therapeutic multivitamin-minerals tablet  Take 1 tablet by mouth daily.       Physical exam BP 170/64 mmHg  Pulse 68  Temp(Src) 97.7 F (36.5 C)  Resp 18  Wt 254 lb 4.8 oz (115.35 kg)  SpO2 95%  Wt Readings from Last 3 Encounters:  09/18/14 254 lb 4.8 oz (115.35 kg)  07/01/14 239 lb 6.4 oz (108.591 kg)  05/23/14 238 lb 6.4 oz (108.138 kg)   General- elderly female in no acute distress, obese Neck- no lymphadenopathy Cardiovascular- irregular but controlled rate, no murmurs, distal pulses intact, 1+ leg edema Respiratory- bilateral clear to auscultation, no wheeze, no rhonchi, no crackles, no use of accessory muscles Abdomen- bowel sounds present, soft, non tender Musculoskeletal- able to move all 4 extremities, mainly in bed and requires assistance with her ADLS Neurological- she is alert Skin- warm and dry Psychiatry- normal mood and affect  Labs  CBC Latest Ref Rng 03/27/2014 11/15/2013 06/04/2013  WBC - 6.6 6.5 6.3  Hemoglobin 12.0 - 16.0 g/dL 10.9(A) 10.3(A) 11.5(A)  Hematocrit 36 - 46 % 35(A) 35(A) 38  Platelets 150 - 399 K/L 175 236 243   5.18.16 wbc 7, hb 10.9, hct 34.5, bun 30, cr 0.87, na 143, k 4.3, tsh 0.481 04/26/14 no microalbuminuria 03/27/14 a1c 5.6, ferritin normal, t.chol 189, ldl 133, hdl 43, tg 66  Assessment/Plan  Hyperlipidemia Reviewed lipid panel from 2/16. Continue lipitor 10 mg daily and check lipid panel  Anemia of chronic disease On ferrous sulfate 325 mg bid for now, last hb 10.9, check cbc  HTN bp elevated. Currently on metoprolol tartrate 12.5 mg twice daily. Increase this to 25 mg daily and monitor bp daily for 2 weeks and  reassess  Weight gain With edema. No crackles on lung exam. Check tsh and a1c with prealbumin. Consider small dosing of lasix for leg edema if other test result is normal  DM Last a1c 5.6 in feb 2016. With current weight gain, check a1c.  alzhimer's Dementia continue exelon patch 13.3 mg daily and monitor her weight. Fall precautions, continue assistance with ADLS and monitor weight

## 2014-09-19 LAB — HEPATIC FUNCTION PANEL
ALT: 43 U/L — AB (ref 7–35)
AST: 36 U/L — AB (ref 13–35)
Alkaline Phosphatase: 105 U/L (ref 25–125)

## 2014-09-19 LAB — LIPID PANEL
CHOLESTEROL: 118 mg/dL (ref 0–200)
HDL: 40 mg/dL (ref 35–70)
LDL Cholesterol: 62 mg/dL
TRIGLYCERIDES: 80 mg/dL (ref 40–160)

## 2014-09-19 LAB — HEMOGLOBIN A1C: Hgb A1c MFr Bld: 5.4 % (ref 4.0–6.0)

## 2014-09-19 LAB — BASIC METABOLIC PANEL
BUN: 32 mg/dL — AB (ref 4–21)
Creatinine: 0.8 mg/dL (ref 0.5–1.1)
Glucose: 87 mg/dL
POTASSIUM: 4.5 mmol/L (ref 3.4–5.3)
Sodium: 149 mmol/L — AB (ref 137–147)

## 2014-09-19 LAB — CBC AND DIFFERENTIAL
HCT: 37 % (ref 36–46)
HCT: 37 % (ref 36–46)
HEMOGLOBIN: 11.5 g/dL — AB (ref 12.0–16.0)
HEMOGLOBIN: 11.5 g/dL — AB (ref 12.0–16.0)
PLATELETS: 213 10*3/uL (ref 150–399)
WBC: 5.1 10*3/mL
WBC: 5.1 10*3/mL

## 2014-09-19 LAB — TSH: TSH: 0.47 u[IU]/mL (ref 0.41–5.90)

## 2014-10-23 ENCOUNTER — Non-Acute Institutional Stay (SKILLED_NURSING_FACILITY): Payer: 59 | Admitting: Internal Medicine

## 2014-10-23 DIAGNOSIS — E119 Type 2 diabetes mellitus without complications: Secondary | ICD-10-CM

## 2014-10-23 DIAGNOSIS — D638 Anemia in other chronic diseases classified elsewhere: Secondary | ICD-10-CM

## 2014-10-23 DIAGNOSIS — I10 Essential (primary) hypertension: Secondary | ICD-10-CM | POA: Diagnosis not present

## 2014-10-23 DIAGNOSIS — E785 Hyperlipidemia, unspecified: Secondary | ICD-10-CM

## 2014-10-23 DIAGNOSIS — K5901 Slow transit constipation: Secondary | ICD-10-CM

## 2014-10-23 NOTE — Progress Notes (Signed)
Patient ID: Sylvia Medina, female   DOB: Oct 31, 1935, 79 y.o.   MRN: [id number]     Facility: [facility]   Chief Complaint  Patient presents with  . Medical Management of Chronic Issues   No Known Allergies   Code status: full code  HPI 79 y/o female pt is seen today for routine visit. She is in no distress and denies any concerns. No concerns from nursing saff. She has dementia and is pleasantly confused. No falls reported. No new skin concern. Refuses to get out of bed except for the beuatician visit.   ROS Constitutional: Negative for fever, chills, malaise/fatigue and diaphoresis.  Respiratory: Negative for cough, shortness of breath and wheezing.   Cardiovascular: Negative for chest pain, palpitations Gastrointestinal: Negative for heartburn, nausea, vomiting, abdominal pain Genitourinary: Negative for dysuria  Musculoskeletal: Negative for back pain, falls Skin: Negative for itching and rash.  Neurological: Negative for weakness,dizziness,and headaches.  Psychiatric/Behavioral: The patient is not nervous/anxious  Past Medical History  Diagnosis Date  . Dementia 07/04/2012  . Anemia 07/04/2012  . Unspecified constipation 07/04/2012  . Unspecified vitamin D deficiency 07/04/2012  . Diabetes mellitus type 2, diet-controlled 07/04/2012  . CVA (cerebral vascular accident) 07/04/2012  . Unspecified hypothyroidism 07/04/2012  . Cardiac arrhythmia 07/04/2012   No past surgical history on file.   Medication List       This list is accurate as of: 10/23/14  5:15 PM.  Always use your most recent med list.               atorvastatin 10 MG tablet  Commonly known as:  LIPITOR  Take 10 mg by mouth daily.     ferrous sulfate 325 (65 FE) MG tablet  Take 325 mg by mouth 2 (two) times daily with a meal.     metoprolol tartrate 25 MG tablet  Commonly known as:  LOPRESSOR  Take 25 mg by mouth 2 (two) times daily.     polyethylene glycol powder powder    Commonly known as:  GLYCOLAX/MIRALAX  Take 17 g by mouth daily.     Rivastigmine 13.3 MG/24HR Pt24  Commonly known as:  EXELON  Place 1 patch (13.3 mg total) onto the skin daily.     therapeutic multivitamin-minerals tablet  Take 1 tablet by mouth daily.       Physical exam BP 150/61 mmHg  Pulse 67  Temp(Src) 98.3 F (36.8 C)  Resp 16  SpO2 96%  Wt Readings from Last 3 Encounters:  09/18/14 254 lb 4.8 oz (115.35 kg)  07/01/14 239 lb 6.4 oz (108.591 kg)  05/23/14 238 lb 6.4 oz (108.138 kg)   General- elderly female in no acute distress, obese Neck- no lymphadenopathy Cardiovascular- irregular but controlled rate, no murmurs, distal pulses intact, 1+ leg edema Respiratory- bilateral clear to auscultation, no wheeze, no rhonchi, no crackles, no use of accessory muscles Abdomen- bowel sounds present, soft, non tender Musculoskeletal- able to move all 4 extremities, mainly in bed and requires assistance with her ADLS Neurological- she is alert Skin- warm and dry Psychiatry- normal mood and affect  Labs  CBC Latest Ref Rng 03/27/2014 11/15/2013 06/04/2013  WBC - 6.6 6.5 6.3  Hemoglobin 12.0 - 16.0 g/dL 10.9(A) 10.3(A) 11.5(A)  Hematocrit 36 - 46 % 35(A) 35(A) 38  Platelets 150 - 399 K/L 175 236 243    03/27/14 a1c 5.6, ferritin normal, t.chol 189, ldl 133, hdl 43, tg 66 04/26/14 no microalbuminuria 07/02/14 wbc 7,  hb 10.9, hct 34.5, bun 30, cr 0.87, na 143, k 4.3, tsh 0.481 09/19/14 wbc 5.1, hb 11.5, hct 36.6, plt 213, na 149, k 4.5, glu 87, bun 32, cr 0.75, alb 2.6, t.chol 118, ldl 62, hdl 40, tg 80, a1c 5.4  Assessment/Plan  Uncontrolled HTN bp remains elevated. On metoprolol 25 mg bid. Add lisinopril 5 mg daily and monitor bp daily for 2 weeks and reassess  Anemia of chronic disease Improved Hb on review. Continue ferrous sulfate 325 mg bid for now and check cbc in 3 months  HLD ldl at goal, decrease lipitor to 5 mg daily and monitor  Constipation Stable, continue  miralax for now  Dm 2 Diet controlled, a1c at goal. On statin, starting ACEI, see above.  Oneal Grout, MD  [facility] [phone] (Monday-Friday 8 am - 5 pm) [phone] (afterhours)

## 2014-11-19 ENCOUNTER — Non-Acute Institutional Stay (SKILLED_NURSING_FACILITY): Payer: 59 | Admitting: Nurse Practitioner

## 2014-11-19 DIAGNOSIS — K5901 Slow transit constipation: Secondary | ICD-10-CM

## 2014-11-19 DIAGNOSIS — F0280 Dementia in other diseases classified elsewhere without behavioral disturbance: Secondary | ICD-10-CM | POA: Diagnosis not present

## 2014-11-19 DIAGNOSIS — E119 Type 2 diabetes mellitus without complications: Secondary | ICD-10-CM | POA: Diagnosis not present

## 2014-11-19 DIAGNOSIS — G308 Other Alzheimer's disease: Secondary | ICD-10-CM

## 2014-11-19 DIAGNOSIS — I10 Essential (primary) hypertension: Secondary | ICD-10-CM

## 2014-11-19 DIAGNOSIS — E785 Hyperlipidemia, unspecified: Secondary | ICD-10-CM

## 2014-11-19 DIAGNOSIS — D638 Anemia in other chronic diseases classified elsewhere: Secondary | ICD-10-CM | POA: Diagnosis not present

## 2014-11-19 NOTE — Progress Notes (Signed)
Patient ID: Sylvia Medina, female   DOB: 02-16-1935, 79 y.o.   MRN: [id number]    Nursing Home Location:  [place] of Service: SNF (31)  PCP: Blanchie Serve, MD  No Known Allergies  Chief Complaint  Patient presents with  . Medical Management of Chronic Issues    Medical Management of Chronic Issues     HPI:  Patient is a 79 y.o. female seen today at [facility] for routine follow up. Pt with a pmh of old CVA, dementia, anemia, DM2. Pt has been doing well in the last month. Remains in bed because she can not tolerate the lift to get out of bed to [facility]. Pt reports good mood. No depression or anxiety. No insomnia. No constipation or diarrhea. Eating well.  pts blood pressure has been uncontrolled, lisinopril added last month. Blood pressures reviewed and remains elevated.  Review of Systems:  Review of Systems  Constitutional: Negative for activity change, appetite change, fatigue and unexpected weight change.  HENT: Negative for congestion and hearing loss.   Eyes: Negative.   Respiratory: Negative for cough and shortness of breath.   Cardiovascular: Negative for chest pain, palpitations and leg swelling.  Gastrointestinal: Negative for abdominal pain, diarrhea and constipation.  Genitourinary: Negative for dysuria and difficulty urinating.  Musculoskeletal: Negative for myalgias and arthralgias.  Skin: Negative.  Negative for color change and wound.  Neurological: Negative for dizziness, weakness and headaches.  Psychiatric/Behavioral: Negative for behavioral problems, confusion and agitation.       Memory loss    Past Medical History  Diagnosis Date  . Dementia 07/04/2012  . Anemia 07/04/2012  . Unspecified constipation 07/04/2012  . Unspecified vitamin D deficiency 07/04/2012  . Diabetes mellitus type 2, diet-controlled 07/04/2012  . CVA (cerebral vascular accident) 07/04/2012  . Unspecified hypothyroidism 07/04/2012  . Cardiac  arrhythmia 07/04/2012   No past surgical history on file. Social History:   reports that she has never smoked. She does not have any smokeless tobacco history on file. She reports that she does not drink alcohol or use illicit drugs.  No family history on file.  Medications: Patient's Medications  New Prescriptions   No medications on file  Previous Medications   ACETAMINOPHEN (TYLENOL) 325 MG TABLET    Take two tablets by mouth twice daily for foot pain. Do not exceed 4gms of Tylenol in 24 hours   ATORVASTATIN (LIPITOR) 10 MG TABLET    Take 1/2 tablet by mouth every evening for Cholesterol   FERROUS SULFATE 325 (65 FE) MG TABLET    Take one tablet by mouth twice daily for anemia   LISINOPRIL (PRINIVIL,ZESTRIL) 10 MG TABLET    Take one tablet by mouth once daily for blood pressure   METOPROLOL TARTRATE (LOPRESSOR) 25 MG TABLET    Take one tablet by mouth twice daily for blood pressure   POLYETHYLENE GLYCOL POWDER (GLYCOLAX/MIRALAX) POWDER    Take 17 g by mouth daily.   RIVASTIGMINE (EXELON) 13.3 MG/24HR PT24    Place 1 patch (13.3 mg total) onto the skin daily.   THERAPEUTIC MULTIVITAMIN-MINERALS (THERAGRAN-M) TABLET    Take 1 tablet by mouth daily.  Modified Medications   No medications on file  Discontinued Medications   No medications on file     Physical Exam: Filed Vitals:   11/19/14 1630  BP: 169/74  Pulse: 59  Temp: 97.8 F (36.6 C)  TempSrc: Oral  Resp: 20  Height:  _0  (1.651 m)  Weight: 254 lb (115.214 kg)  SpO2: 97%    Physical Exam  Constitutional: She appears well-developed and well-nourished. No distress.  Obese AA female NAD  HENT:  Head: Normocephalic and atraumatic.  Mouth/Throat: Oropharynx is clear and moist. No oropharyngeal exudate.  Eyes: Conjunctivae are normal. Pupils are equal, round, and reactive to light.  Neck: Normal range of motion. Neck supple.  Cardiovascular: Normal rate, regular rhythm and normal heart sounds.   Pulmonary/Chest:  Effort normal and breath sounds normal.  Abdominal: Soft. Bowel sounds are normal.  Musculoskeletal: She exhibits no edema or tenderness.  Neurological: She is alert.  Skin: Skin is warm and dry. She is not diaphoretic.  Psychiatric: She has a normal mood and affect.    Labs reviewed: Basic Metabolic Panel:  Recent Labs  03/27/14 09/19/14  NA 146 149*  K 4.6 4.5  BUN 28* 32*  CREATININE 0.8 0.8   Liver Function Tests:  Recent Labs  09/19/14  AST 36*  ALT 43*  ALKPHOS 105   No results for input(s): LIPASE, AMYLASE in the last 8760 hours. No results for input(s): AMMONIA in the last 8760 hours. CBC:  Recent Labs  03/27/14 09/19/14  WBC 6.6 5.1  HGB 10.9* 11.5*  HCT 35* 37  PLT 175 213   TSH:  Recent Labs  12/20/13 09/19/14  TSH 0.59 0.47   A1C: Lab Results  Component Value Date   HGBA1C 5.4 09/19/2014   Lipid Panel:  Recent Labs  03/27/14 09/19/14  CHOL 189 118  HDL 66 40  LDLCALC 133 62  TRIG 66 80    Result Date: 07/02/14 11:59 AM      Analyte   Result Value   Ref. Range    Units   Out of Range   Lab  WBC  7.0  4.0-10.5  K/uL    SLN  RBC  4.29  3.87-5.11  MIL/uL      Hemoglobin  10.9  12.0-15.0  g/dL  L    Hematocrit  34.5  36.0-46.0  %  L    MCV  80.4  78.0-100.0  fL      MCH  25.4  26.0-34.0  pg  L    MCHC  31.6  30.0-36.0  g/dL      RDW  16.4  11.5-15.5  %  H    Platelet Count  214  150-400  K/uL      MPV  10.6  8.6-12.4  fL      Granulocyte %  56  43-77  %      Absolute Gran  3.9  1.7-7.7  K/uL      Lymph %  36  12-46  %      Absolute Lymph  2.5  0.7-4.0  K/uL      Mono %  6  3-12  %      Absolute Mono  0.4  0.1-1.0  K/uL      Eos %  2  0-5  %      Absolute Eos  0.1  0.0-0.7  K/uL      Baso %  0  0-1  %      Absolute Baso  0.0  0.0-0.1  K/uL      Smear Review  Criteria for review not met          Basic Metabolic Panel  Status: Final Out of Range  Result Date: 07/02/14 11:59 AM  Analyte   Result Value   Ref. Range    Units     Out of Range   Lab  Sodium  143  135-145  mEq/L    SLN  Potassium  4.3  3.5-5.3  mEq/L      Chloride  114  96-112  mEq/L  H    CO2  22  19-32  mEq/L      Glucose  85  70-99  mg/dL      BUN  30  6-23  mg/dL  H    Creatinine  0.87  0.50-1.10  mg/dL      Calcium  8.5  8.4-10.5  mg/dL      TSH  Status: Final   Result Date: 07/02/14 11:59 AM      Analyte   Result Value   Ref. Range    Units   Out of Range   Lab  TSH  0.481  0.350-4.500  uIU/mL   Assessment/Plan  1. Uncontrolled hypertension Remains elevated, conts on metoprolol 25 mg BID and will increase lisinopril to 10 mg daily -will follow up BMP in 1 week  2. Diabetes mellitus type 2, diet-controlled ([place]) -diet controlled, A1cat goal. conts on statin and ACEI  3. Anemia of chronic disease Stable, conts on iron BID  4. Alzheimer's disease of other onset without behavioral disturbance -stable, without acute worsening cognitive or functional status  5. Slow transit constipation Well maintained on miralax   6. Hyperlipidemia Stable, conts on Lipitor 5 mg daily, will monitor.     Carlos American. Harle Battiest  [facility] [phone] 8 am - 5 pm) [phone] (after hours)

## 2014-11-26 LAB — BASIC METABOLIC PANEL
BUN: 38 mg/dL — AB (ref 4–21)
Creatinine: 0.9 mg/dL (ref <=1.1)
GLUCOSE: 90 mg/dL
POTASSIUM: 4.8 mmol/L (ref 3.4–5.3)
Sodium: 148 mmol/L — AB (ref 137–147)

## 2014-12-24 ENCOUNTER — Non-Acute Institutional Stay (SKILLED_NURSING_FACILITY): Payer: Medicare PPO | Admitting: Nurse Practitioner

## 2014-12-24 ENCOUNTER — Encounter: Payer: Self-pay | Admitting: Nurse Practitioner

## 2014-12-24 DIAGNOSIS — F0390 Unspecified dementia without behavioral disturbance: Secondary | ICD-10-CM

## 2014-12-24 DIAGNOSIS — D638 Anemia in other chronic diseases classified elsewhere: Secondary | ICD-10-CM

## 2014-12-24 DIAGNOSIS — I10 Essential (primary) hypertension: Secondary | ICD-10-CM | POA: Diagnosis not present

## 2014-12-24 DIAGNOSIS — E785 Hyperlipidemia, unspecified: Secondary | ICD-10-CM

## 2014-12-24 NOTE — Progress Notes (Signed)
Nursing Home Location:  [facility]   Place of Service: SNF (31)  PCP: Oneal Grout, MD  No Known Allergies  Chief Complaint  Patient presents with  . Medical Management of Chronic Issues    HPI:  Patient is a 79 y.o. female seen today at [facility] for routine follow up. Pt with a pmh of old CVA, HTN, obesity, dementia, anemia, DM2. Pt has been doing well in the last month. There has been no acute issues. No mention of pain except in bilateral feet/toes when touched, which is unchanged. Lisinopril increased last month due to elevated blood pressure. Blood pressure is much improved. Pt without complaints today and nursing has no concerns.   Review of Systems:  Review of Systems  Constitutional: Negative for activity change, appetite change, fatigue and unexpected weight change.  HENT: Negative for congestion and hearing loss.   Eyes: Negative.   Respiratory: Negative for cough and shortness of breath.   Cardiovascular: Negative for chest pain, palpitations and leg swelling.  Gastrointestinal: Negative for abdominal pain, diarrhea and constipation.  Genitourinary: Negative for dysuria and difficulty urinating.  Musculoskeletal: Negative for myalgias and arthralgias.  Skin: Negative.  Negative for color change and wound.  Neurological: Negative for dizziness, weakness and headaches.  Psychiatric/Behavioral: Negative for behavioral problems, confusion and agitation.       Memory loss    Past Medical History  Diagnosis Date  . Dementia 07/04/2012  . Anemia 07/04/2012  . Unspecified constipation 07/04/2012  . Unspecified vitamin D deficiency 07/04/2012  . Diabetes mellitus type 2, diet-controlled ([facility]) 07/04/2012  . CVA (cerebral vascular accident) (HCC) 07/04/2012  . Unspecified hypothyroidism 07/04/2012  . Cardiac arrhythmia 07/04/2012   No past surgical history on file. Social History:   reports that she has never smoked. She does not  have any smokeless tobacco history on file. She reports that she does not drink alcohol or use illicit drugs.  No family history on file.  Medications: Patient's Medications  New Prescriptions   No medications on file  Previous Medications   ACETAMINOPHEN (TYLENOL) 325 MG TABLET    Take two tablets by mouth twice daily for foot pain. Do not exceed 4gms of Tylenol in 24 hours   ATORVASTATIN (LIPITOR) 10 MG TABLET    Take 1/2 tablet by mouth every evening for Cholesterol   FERROUS SULFATE 325 (65 FE) MG TABLET    Take one tablet by mouth twice daily for anemia   LISINOPRIL (PRINIVIL,ZESTRIL) 10 MG TABLET    Take one tablet by mouth once daily for blood pressure   METOPROLOL TARTRATE (LOPRESSOR) 25 MG TABLET    Take one tablet by mouth twice daily for blood pressure   POLYETHYLENE GLYCOL POWDER (GLYCOLAX/MIRALAX) POWDER    Take 17 g by mouth daily.   RIVASTIGMINE (EXELON) 13.3 MG/24HR PT24    Place 1 patch (13.3 mg total) onto the skin daily.   THERAPEUTIC MULTIVITAMIN-MINERALS (THERAGRAN-M) TABLET    Take 1 tablet by mouth daily.  Modified Medications   No medications on file  Discontinued Medications   No medications on file     Physical Exam: Filed Vitals:   12/24/14 1033  BP: 148/59  Pulse: 75  Temp: 97.5 F (36.4 C)  Resp: 16  SpO2: 94%    Physical Exam  Constitutional: She appears well-developed and well-nourished. No distress.  Obese AA female NAD  HENT:  Head: Normocephalic and atraumatic.  Mouth/Throat: Oropharynx is clear  and moist. No oropharyngeal exudate.  Neck: Normal range of motion. Neck supple.  Cardiovascular: Normal rate, regular rhythm and normal heart sounds.   Pulmonary/Chest: Effort normal and breath sounds normal.  Abdominal: Soft. Bowel sounds are normal.  Musculoskeletal: She exhibits no edema or tenderness.  Neurological: She is alert.  Skin: Skin is warm and dry. She is not diaphoretic.  Psychiatric: She has a normal mood and affect.    Labs  reviewed: Basic Metabolic Panel:  Recent Labs  16/11/9600/11/16 09/19/14 11/26/14  NA 146 149* 148*  K 4.6 4.5 4.8  BUN 28* 32* 38*  CREATININE 0.8 0.8 0.9   Liver Function Tests:  Recent Labs  09/19/14  AST 36*  ALT 43*  ALKPHOS 105   No results for input(s): LIPASE, AMYLASE in the last 8760 hours. No results for input(s): AMMONIA in the last 8760 hours. CBC:  Recent Labs  03/27/14 09/19/14  WBC 6.6 5.1  5.1  HGB 10.9* 11.5*  11.5*  HCT 35* 37  37  PLT 175 213   TSH:  Recent Labs  09/19/14  TSH 0.47   A1C: Lab Results  Component Value Date   HGBA1C 5.4 09/19/2014   Lipid Panel:  Recent Labs  03/27/14 09/19/14  CHOL 189 118  HDL 66 40  LDLCALC 133 62  TRIG 66 80     Assessment/Plan 1. Essential hypertension, benign -blood pressure under much better controlled at this time, conts on metoprolol and lisinopril   2. Anemia of chronic disease Cbc stable, conts on iron supplements  3. Hyperlipidemia LDL at goal in august, conts on lipitor 5 mg daily   4. Dementia without behavioral disturbance -cognitive and functional status remains stable, cont on exelon patch   Wm Sahagun K. Biagio BorgEubanks, AGNP  [facility] [phone](Monday-Friday 8 am - 5 pm) [phone] (after hours)

## 2015-01-23 LAB — BASIC METABOLIC PANEL
BUN: 28 mg/dL — AB (ref 4–21)
CREATININE: 0.8 mg/dL (ref 0.5–1.1)
GLUCOSE: 74 mg/dL
POTASSIUM: 4.6 mmol/L (ref 3.4–5.3)
SODIUM: 147 mmol/L (ref 137–147)

## 2015-01-23 LAB — CBC AND DIFFERENTIAL
HCT: 40 % (ref 36–46)
Hemoglobin: 11.7 g/dL — AB (ref 12.0–16.0)
Platelets: 282 10*3/uL (ref 150–399)
WBC: 6.2 10^3/mL

## 2015-01-23 LAB — MICROALBUMIN, URINE: Microalb, Ur: 185.1

## 2015-02-06 ENCOUNTER — Non-Acute Institutional Stay (SKILLED_NURSING_FACILITY): Payer: 59 | Admitting: Adult Health

## 2015-02-06 DIAGNOSIS — E119 Type 2 diabetes mellitus without complications: Secondary | ICD-10-CM | POA: Diagnosis not present

## 2015-02-06 DIAGNOSIS — G300 Alzheimer's disease with early onset: Secondary | ICD-10-CM | POA: Diagnosis not present

## 2015-02-06 DIAGNOSIS — K5901 Slow transit constipation: Secondary | ICD-10-CM

## 2015-02-06 DIAGNOSIS — I10 Essential (primary) hypertension: Secondary | ICD-10-CM

## 2015-02-06 DIAGNOSIS — E559 Vitamin D deficiency, unspecified: Secondary | ICD-10-CM | POA: Diagnosis not present

## 2015-02-06 DIAGNOSIS — F0280 Dementia in other diseases classified elsewhere without behavioral disturbance: Secondary | ICD-10-CM | POA: Diagnosis not present

## 2015-02-06 DIAGNOSIS — I639 Cerebral infarction, unspecified: Secondary | ICD-10-CM

## 2015-02-06 DIAGNOSIS — E785 Hyperlipidemia, unspecified: Secondary | ICD-10-CM

## 2015-02-08 ENCOUNTER — Encounter: Payer: Self-pay | Admitting: Adult Health

## 2015-02-08 NOTE — Progress Notes (Signed)
Patient ID: Sylvia Medina, female   DOB: [birth year], 79 y.o.   MRN: [id number]   [facility]      No Known Allergies  Chief Complaint  Patient presents with  . Medical Management of Chronic Issues    HPI:  She is a long term resident of this facility being seen for the management of her chronic illnesses. Overall there is little change in her status. She not voicing any concerns or complaints; stating that she is feeling good. There are no nursing concerns at this time.    Past Medical History  Diagnosis Date  . Dementia 07/04/2012  . Anemia 07/04/2012  . Unspecified constipation 07/04/2012  . Unspecified vitamin D deficiency 07/04/2012  . Diabetes mellitus type 2, diet-controlled (HCC) 07/04/2012  . CVA (cerebral vascular accident) (HCC) 07/04/2012  . Unspecified hypothyroidism 07/04/2012  . Cardiac arrhythmia 07/04/2012    No past surgical history on file.  VITAL SIGNS BP 130/68 mmHg  Pulse 75  Resp 18  SpO2 98%  Patient's Medications  New Prescriptions   No medications on file  Previous Medications   ACETAMINOPHEN (TYLENOL) 325 MG TABLET    Take two tablets by mouth twice daily for foot pain. Do not exceed 4gms of Tylenol in 24 hours   ATORVASTATIN (LIPITOR) 10 MG TABLET    Take 1/2 tablet by mouth every evening for Cholesterol   FERROUS SULFATE 325 (65 FE) MG TABLET    Take one tablet by mouth twice daily for anemia   LISINOPRIL (PRINIVIL,ZESTRIL) 10 MG TABLET    Take one tablet by mouth once daily for blood pressure   METOPROLOL TARTRATE (LOPRESSOR) 25 MG TABLET    Take one tablet by mouth twice daily for blood pressure   POLYETHYLENE GLYCOL POWDER (GLYCOLAX/MIRALAX) POWDER    Take 17 g by mouth daily.   RIVASTIGMINE (EXELON) 13.3 MG/24HR PT24    Place 1 patch (13.3 mg total) onto the skin daily.   THERAPEUTIC MULTIVITAMIN-MINERALS (THERAGRAN-M) TABLET    Take 1 tablet by mouth daily.  Modified Medications   No medications on file  Discontinued  Medications   No medications on file     SIGNIFICANT DIAGNOSTIC EXAMS   LABS REVIEWED:   09-19-14: chol 80; ldl 62; trig 80; hdl 40 01-23-15: wbc 6.2; hgb 11.7; hct 39.7; mcv 86.3; plt 282; glucose 74; bun 28; creat 0.75; k+ 4.6; na++147; vit d 28.49; urine micro-albumin 185.1    Review of Systems  Constitutional: Negative for malaise/fatigue.  Respiratory: Negative for cough and shortness of breath.   Cardiovascular: Negative for chest pain, palpitations and leg swelling.  Gastrointestinal: Negative for heartburn, abdominal pain and constipation.  Musculoskeletal: Negative for myalgias and joint pain.  Skin: Negative.   Psychiatric/Behavioral: The patient is not nervous/anxious.      Physical Exam  Constitutional: No distress.  Obese   Eyes: Conjunctivae are normal.  Neck: Neck supple. No JVD present. No thyromegaly present.  Cardiovascular: Normal rate, regular rhythm and intact distal pulses.   Respiratory: Effort normal and breath sounds normal. No respiratory distress. She has no wheezes.  GI: Soft. Bowel sounds are normal. She exhibits no distension. There is no tenderness.  Musculoskeletal: She exhibits no edema.  Is able to move upper extremities  Does not move lower extremities   Lymphadenopathy:    She has no cervical adenopathy.  Neurological: She is alert.  Skin: Skin is warm and dry. She is not diaphoretic.  Psychiatric: She has a normal mood  and affect.       ASSESSMENT/ PLAN:  1. CVA: is neurologically stable is presently is presently not taking medications; will monitor  2. Dyslipidemia: will continue lipitor 5 mg daily   3. Hypertension: will continue lisinopril 10 mg daily lopressor 25 mg twice daily   4. Dementia: is without significant change; will continue exelon patch 13.3 mg daily   5. Constipation: will continue miralax daily  6. Vitamin d def: will continue vit d 50,000 units weekly   7. Diabetes: is diet controlled; will  monitor   Time spent with patient 40   minutes >50% time spent counseling; reviewing medical record; tests; labs; and developing future plan of care     Synthia Innocent NP [facility]  Contact [phone] Monday through Friday 8am- 5pm  After hours call [phone]

## 2015-02-10 LAB — HEMOGLOBIN A1C: Hemoglobin A1C: 5.3

## 2015-02-18 LAB — HM DIABETES FOOT EXAM

## 2015-03-09 ENCOUNTER — Non-Acute Institutional Stay (SKILLED_NURSING_FACILITY): Payer: Medicare Other | Admitting: Nurse Practitioner

## 2015-03-09 ENCOUNTER — Encounter: Payer: Self-pay | Admitting: Nurse Practitioner

## 2015-03-09 DIAGNOSIS — E119 Type 2 diabetes mellitus without complications: Secondary | ICD-10-CM | POA: Diagnosis not present

## 2015-03-09 DIAGNOSIS — D638 Anemia in other chronic diseases classified elsewhere: Secondary | ICD-10-CM | POA: Diagnosis not present

## 2015-03-09 DIAGNOSIS — I10 Essential (primary) hypertension: Secondary | ICD-10-CM

## 2015-03-09 DIAGNOSIS — F0390 Unspecified dementia without behavioral disturbance: Secondary | ICD-10-CM | POA: Diagnosis not present

## 2015-03-09 DIAGNOSIS — E559 Vitamin D deficiency, unspecified: Secondary | ICD-10-CM | POA: Diagnosis not present

## 2015-03-09 NOTE — Progress Notes (Signed)
Nursing Home Location:  [facility]   Place of Service: SNF (31)  PCP: Oneal Grout, MD  No Known Allergies  Chief Complaint  Patient presents with  . Medical Management of Chronic Issues    routine Visit    HPI:  Patient is a 80 y.o. female seen today at [facility] for routine follow up. Pt with a pmh of old CVA, HTN, obesity, dementia, anemia, DM2. Pt has been doing well in the last month. There has been no acute issues. Pt denies pain, changes in appetite, mood. No shortness of breath, chest pains, no changes to bowel or bladder. Staff without concerns at this time. Pt reports toes and legs tender only when touched. Otherwise no pain noted. Pt does not get oob due to pain when being lifted and being uncomfortable in the changes. Reports she is not depressed or anxious.   Review of Systems:  Review of Systems  Constitutional: Negative for activity change, appetite change, fatigue and unexpected weight change.  HENT: Negative for congestion and hearing loss.   Eyes: Negative.   Respiratory: Negative for cough and shortness of breath.   Cardiovascular: Negative for chest pain, palpitations and leg swelling.  Gastrointestinal: Negative for abdominal pain, diarrhea and constipation.  Genitourinary: Negative for dysuria and difficulty urinating.  Musculoskeletal: Negative for myalgias and arthralgias.  Skin: Negative.  Negative for color change and wound.  Neurological: Negative for dizziness, weakness and headaches.  Psychiatric/Behavioral: Negative for behavioral problems, confusion and agitation.       Memory loss    Past Medical History  Diagnosis Date  . Dementia 07/04/2012  . Anemia 07/04/2012  . Unspecified constipation 07/04/2012  . Unspecified vitamin D deficiency 07/04/2012  . Diabetes mellitus type 2, diet-controlled (HCC) 07/04/2012  . CVA (cerebral vascular accident) (HCC) 07/04/2012  . Unspecified hypothyroidism 07/04/2012  .  Cardiac arrhythmia 07/04/2012   History reviewed. No pertinent past surgical history. Social History:   reports that she has never smoked. She does not have any smokeless tobacco history on file. She reports that she does not drink alcohol or use illicit drugs.  History reviewed. No pertinent family history.  Medications: Patient's Medications  New Prescriptions   No medications on file  Previous Medications   ACETAMINOPHEN (TYLENOL) 325 MG TABLET    Take two tablets by mouth twice daily for foot pain. Do not exceed 4gms of Tylenol in 24 hours   ATORVASTATIN (LIPITOR) 10 MG TABLET    Take 1/2 tablet by mouth every evening for Cholesterol   CHOLECALCIFEROL (VITAMIN D3) 50000 UNITS CAPS    Take 1 capsule by mouth once a week.   FERROUS SULFATE 325 (65 FE) MG TABLET    Take one tablet by mouth twice daily for anemia   LISINOPRIL (PRINIVIL,ZESTRIL) 10 MG TABLET    Take one tablet by mouth once daily for blood pressure   METOPROLOL TARTRATE (LOPRESSOR) 25 MG TABLET    Take one tablet by mouth twice daily for blood pressure   POLYETHYLENE GLYCOL POWDER (GLYCOLAX/MIRALAX) POWDER    Take 17 g by mouth daily.   RIVASTIGMINE (EXELON) 13.3 MG/24HR PT24    Place 1 patch (13.3 mg total) onto the skin daily.   THERAPEUTIC MULTIVITAMIN-MINERALS (THERAGRAN-M) TABLET    Take 1 tablet by mouth daily.  Modified Medications   No medications on file  Discontinued Medications   No medications on file     Physical Exam: Filed Vitals:  03/09/15 1101  BP: 144/64  Pulse: 59  Temp: 97.3 F (36.3 C)  Resp: 20  Height:  (1.651 m)  Weight: 253 lb (114.76 kg)  SpO2: 99%    Physical Exam  Constitutional: She appears well-developed and well-nourished. No distress.  Obese AA female NAD  HENT:  Head: Normocephalic and atraumatic.  Mouth/Throat: Oropharynx is clear and moist. No oropharyngeal exudate.  Neck: Normal range of motion. Neck supple.  Cardiovascular: Normal rate, regular rhythm and  normal heart sounds.   Pulmonary/Chest: Effort normal and breath sounds normal.  Abdominal: Soft. Bowel sounds are normal.  Musculoskeletal: She exhibits no edema or tenderness.  Neurological: She is alert.  Skin: Skin is warm and dry. She is not diaphoretic.  Psychiatric: She has a normal mood and affect.    Labs reviewed: Basic Metabolic Panel:  Recent Labs  16/10/96 11/26/14 01/23/15  NA 149* 148* 147  K 4.5 4.8 4.6  BUN 32* 38* 28*  CREATININE 0.8 0.9 0.8   Liver Function Tests:  Recent Labs  09/19/14  AST 36*  ALT 43*  ALKPHOS 105   No results for input(s): LIPASE, AMYLASE in the last 8760 hours. No results for input(s): AMMONIA in the last 8760 hours. CBC:  Recent Labs  03/27/14 09/19/14 01/23/15  WBC 6.6 5.1  5.1 6.2  HGB 10.9* 11.5*  11.5* 11.7*  HCT 35* 37  37 40  PLT 175 213 282   TSH:  Recent Labs  09/19/14  TSH 0.47   A1C: Lab Results  Component Value Date   HGBA1C 5.3 02/10/2015   Lipid Panel:  Recent Labs  03/27/14 09/19/14  CHOL 189 118  HDL 66 40  LDLCALC 133 62  TRIG 66 80   01-23-15 Vitamin D result: 28.49   Assessment/Plan 1. Vitamin D deficiency Stable, conts on Vit d 50,000 units weekly, follow Vit D level ordered  2. Essential hypertension, benign Blood pressure remains stable, cont current regimen.   3. Diabetes mellitus type 2, diet-controlled (HCC) A1c of 5.3 last month. Pt not currently on medication. Will cont to monitor. Cont diet modifications  4. Dementia without behavioral disturbance -remains stable. No acute changes in cognitive or functional status. conts on Exelon patch  5. Anemia of chronic disease -hgb remains stable. Will monitor.    Janene Harvey. Biagio Borg  [facility] [phone] 8 am - 5 pm) [phone] (after hours)

## 2015-04-06 ENCOUNTER — Encounter: Payer: Self-pay | Admitting: Nurse Practitioner

## 2015-04-06 ENCOUNTER — Non-Acute Institutional Stay (SKILLED_NURSING_FACILITY): Payer: Medicare Other | Admitting: Nurse Practitioner

## 2015-04-06 DIAGNOSIS — E559 Vitamin D deficiency, unspecified: Secondary | ICD-10-CM | POA: Diagnosis not present

## 2015-04-06 DIAGNOSIS — D638 Anemia in other chronic diseases classified elsewhere: Secondary | ICD-10-CM

## 2015-04-06 DIAGNOSIS — I10 Essential (primary) hypertension: Secondary | ICD-10-CM

## 2015-04-06 DIAGNOSIS — F0390 Unspecified dementia without behavioral disturbance: Secondary | ICD-10-CM | POA: Diagnosis not present

## 2015-04-06 DIAGNOSIS — K5901 Slow transit constipation: Secondary | ICD-10-CM

## 2015-04-06 NOTE — Progress Notes (Signed)
Nursing Home Location:  [facility]   Place of Service: SNF (31)  PCP: Oneal Grout, MD  No Known Allergies  Chief Complaint  Patient presents with  . Medical Management of Chronic Issues    Routine Visit    HPI:  Patient is a 80 y.o. female seen today at [facility] for routine follow up. Pt with a pmh of old CVA, HTN, obesity, dementia, anemia, DM2. Pt has been stable in the last month. No acute issues noted by staff. Pt reports she has been doing well. conts to stay in bed by choice, increase pain when she gets up. No pain currently. conts with good appetite. No changes in bowel or bladder.   Review of Systems:  Review of Systems  Constitutional: Negative for activity change, appetite change, fatigue and unexpected weight change.  HENT: Negative for congestion and hearing loss.   Eyes: Negative.   Respiratory: Negative for cough and shortness of breath.   Cardiovascular: Negative for chest pain, palpitations and leg swelling.  Gastrointestinal: Negative for abdominal pain, diarrhea and constipation.  Genitourinary: Negative for dysuria and difficulty urinating.  Musculoskeletal: Negative for myalgias and arthralgias.  Skin: Negative.  Negative for color change and wound.  Neurological: Negative for dizziness, weakness and headaches.  Psychiatric/Behavioral: Negative for behavioral problems, confusion and agitation.       Memory loss    Past Medical History  Diagnosis Date  . Dementia 07/04/2012  . Anemia 07/04/2012  . Unspecified constipation 07/04/2012  . Unspecified vitamin D deficiency 07/04/2012  . Diabetes mellitus type 2, diet-controlled (HCC) 07/04/2012  . CVA (cerebral vascular accident) (HCC) 07/04/2012  . Unspecified hypothyroidism 07/04/2012  . Cardiac arrhythmia 07/04/2012   History reviewed. No pertinent past surgical history. Social History:   reports that she has never smoked. She does not have any smokeless tobacco  history on file. She reports that she does not drink alcohol or use illicit drugs.  History reviewed. No pertinent family history.  Medications: Patient's Medications  New Prescriptions   No medications on file  Previous Medications   ACETAMINOPHEN (TYLENOL) 325 MG TABLET    Take two tablets by mouth twice daily for foot pain. Do not exceed 4gms of Tylenol in 24 hours   ATORVASTATIN (LIPITOR) 10 MG TABLET    Take 1/2 tablet by mouth every evening for Cholesterol   CHOLECALCIFEROL (VITAMIN D3) 50000 UNITS CAPS    Take 1 capsule by mouth once a week.   FERROUS SULFATE 325 (65 FE) MG TABLET    Take one tablet by mouth twice daily for anemia   LISINOPRIL (PRINIVIL,ZESTRIL) 10 MG TABLET    Take one tablet by mouth once daily for blood pressure   METOPROLOL TARTRATE (LOPRESSOR) 25 MG TABLET    Take one tablet by mouth twice daily for blood pressure   POLYETHYLENE GLYCOL POWDER (GLYCOLAX/MIRALAX) POWDER    Take 17 g by mouth daily. Mix in 4-8 ounces of Orange Juice and take by mouth daily for constipation.   RIVASTIGMINE (EXELON) 13.3 MG/24HR PT24    Place 1 patch (13.3 mg total) onto the skin daily.   THERAPEUTIC MULTIVITAMIN-MINERALS (THERAGRAN-M) TABLET    Take 1 tablet by mouth daily.  Modified Medications   No medications on file  Discontinued Medications   No medications on file     Physical Exam: Filed Vitals:   04/06/15 1325  BP: 122/76  Pulse: 72  Temp: 97.9 F (36.6 C)  TempSrc: Oral  Resp: 20  Height:  (1.651 m)  Weight: 267 lb 4.8 oz (121.246 kg)  SpO2: 95%    Physical Exam  Constitutional: She appears well-developed and well-nourished. No distress.  Obese AA female NAD  HENT:  Head: Normocephalic and atraumatic.  Mouth/Throat: Oropharynx is clear and moist. No oropharyngeal exudate.  Neck: Normal range of motion. Neck supple.  Cardiovascular: Normal rate, regular rhythm and normal heart sounds.   Pulmonary/Chest: Effort normal and breath sounds normal.    Abdominal: Soft. Bowel sounds are normal.  Musculoskeletal: She exhibits no edema or tenderness.  Neurological: She is alert.  Skin: Skin is warm and dry. She is not diaphoretic.  Psychiatric: She has a normal mood and affect.    Labs reviewed: Basic Metabolic Panel:  Recent Labs  16/10/96 11/26/14 01/23/15  NA 149* 148* 147  K 4.5 4.8 4.6  BUN 32* 38* 28*  CREATININE 0.8 0.9 0.8   Liver Function Tests:  Recent Labs  09/19/14  AST 36*  ALT 43*  ALKPHOS 105   No results for input(s): LIPASE, AMYLASE in the last 8760 hours. No results for input(s): AMMONIA in the last 8760 hours. CBC:  Recent Labs  09/19/14 01/23/15  WBC 5.1  5.1 6.2  HGB 11.5*  11.5* 11.7*  HCT 37  37 40  PLT 213 282   TSH:  Recent Labs  09/19/14  TSH 0.47   A1C: Lab Results  Component Value Date   HGBA1C 5.3 02/10/2015   Lipid Panel:  Recent Labs  09/19/14  CHOL 118  HDL 40  LDLCALC 62  TRIG 80   01-23-15 Vitamin D result: 28.49   Assessment/Plan 1. Slow transit constipation -stable on miralax daily, will cont current regimen.   2. Dementia without behavioral disturbance -remains stable, no acute changes in cognitive status.  - conts on exelon patch.   3. Vitamin D deficiency -noted vit d def in December, started on Vit D 50,000 units weekly. Follow up vit D level scheduled  4. Essential hypertension, benign - blood pressure remains stable, conts on lisinopril and lopressor   5. Anemia of chronic disease Stable, conts on iron twice daily.    Janene Harvey. Biagio Borg  [facility] [phone] 8 am - 5 pm) [phone] (after hours)

## 2015-04-29 ENCOUNTER — Non-Acute Institutional Stay (SKILLED_NURSING_FACILITY): Payer: Medicare Other | Admitting: Family

## 2015-04-29 DIAGNOSIS — F0390 Unspecified dementia without behavioral disturbance: Secondary | ICD-10-CM

## 2015-04-29 DIAGNOSIS — E559 Vitamin D deficiency, unspecified: Secondary | ICD-10-CM | POA: Diagnosis not present

## 2015-04-29 DIAGNOSIS — K5901 Slow transit constipation: Secondary | ICD-10-CM | POA: Diagnosis not present

## 2015-04-29 DIAGNOSIS — E785 Hyperlipidemia, unspecified: Secondary | ICD-10-CM

## 2015-04-29 DIAGNOSIS — I10 Essential (primary) hypertension: Secondary | ICD-10-CM | POA: Diagnosis not present

## 2015-04-29 NOTE — Progress Notes (Signed)
Patient ID: Sylvia Medina, female   DOB: 10-Feb-1936, 80 y.o.   MRN: [id number]  Location:  [facility]   Place of Service:  SNF (31) Provider: Ryker Sudbury FNP-C   Sylvia GroutPANDEY, MAHIMA, MD  Patient Care Team: Sylvia GroutMahima Pandey, MD as PCP - General (Internal Medicine) Sylvia Holstereborah S Green, NP as Nurse Practitioner (Nurse Practitioner)  Extended Emergency Contact Information Primary Emergency Contact: Medina,Sylvia Address: [address]          [place],  [address] Home Phone: [phone] Relation: None  Code Status:  Full Code  Goals of care: Advanced Directive information Advanced Directives 04/06/2015  Does patient have an advance directive? No  Type of Advance Directive -  Does patient want to make changes to advanced directive? -  Copy of advanced directive(s) in chart? -  Would patient like information on creating an advanced directive? No - patient declined information     Chief Complaint  Patient presents with  . Medical Management of Chronic Issues    HPI:  Pt is a 80 y.o. female seen today at [facility] for medical management of chronic diseases. She has a medical history of Dementia, CVA, Anemia, Type 2 DM, Hyperlipidemia, HTN among others. She is seen in her room today. Facility staff reports no new concerns. HPI and ROS limited due to presence of dementia.    Past Medical History  Diagnosis Date  . Dementia 07/04/2012  . Anemia 07/04/2012  . Unspecified constipation 07/04/2012  . Unspecified vitamin D deficiency 07/04/2012  . Diabetes mellitus type 2, diet-controlled (HCC) 07/04/2012  . CVA (cerebral vascular accident) (HCC) 07/04/2012  . Unspecified hypothyroidism 07/04/2012  . Cardiac arrhythmia 07/04/2012   No past surgical history on file.  No Known Allergies    Medication List       This list is accurate as of: 04/29/15  5:25 PM.  Always use your most recent med list.               acetaminophen 325 MG tablet    Commonly known as:  TYLENOL  Take two tablets by mouth twice daily for foot pain. Do not exceed 4gms of Tylenol in 24 hours     atorvastatin 10 MG tablet  Commonly known as:  LIPITOR  Take 1/2 tablet by mouth every evening for Cholesterol     ferrous sulfate 325 (65 FE) MG tablet  Take one tablet by mouth twice daily for anemia     lisinopril 10 MG tablet  Commonly known as:  PRINIVIL,ZESTRIL  Take one tablet by mouth once daily for blood pressure     metoprolol tartrate 25 MG tablet  Commonly known as:  LOPRESSOR  Take one tablet by mouth twice daily for blood pressure     polyethylene glycol powder powder  Commonly known as:  GLYCOLAX/MIRALAX  Take 17 g by mouth daily. Mix in 4-8 ounces of Orange Juice and take by mouth daily for constipation.     Rivastigmine 13.3 MG/24HR Pt24  Commonly known as:  EXELON  Place 1 patch (13.3 mg total) onto the skin daily.     therapeutic multivitamin-minerals tablet  Take 1 tablet by mouth daily.     Vitamin D3 50000 units Caps  Take 1 capsule by mouth once a week.        Review of Systems  Unable to perform ROS: Dementia    Immunization History  Administered Date(s) Administered  . Influenza-Unspecified 11/29/2012, 11/21/2013, 12/12/2014  .  PPD Test 10/27/2012, 11/01/2013, 10/31/2014  . Pneumococcal-Unspecified 02/10/2010   Pertinent  Health Maintenance Due  Topic Date Due  . OPHTHALMOLOGY EXAM  04/21/2015  . HEMOGLOBIN A1C  08/11/2015  . INFLUENZA VACCINE  09/15/2015  . FOOT EXAM  02/18/2016  . PNA vac Low Risk Adult  Addressed   Fall Risk  04/24/2014  Falls in the past year? Exclusion - non ambulatory  Risk for fall due to : Impaired mobility;Impaired balance/gait   Functional Status Survey:    Filed Vitals:   04/29/15 1719  BP: 132/65  Pulse: 70  Temp: 97.7 F (36.5 C)  Resp: 16  Height:  (1.651 m)  Weight: 275 lb 9.6 oz (125.011 kg)  SpO2: 97%   Body mass index is 45.86 kg/(m^2). Physical Exam   Constitutional: She appears well-developed and well-nourished.  Elderly  HENT:  Head: Normocephalic.  Mouth/Throat: Oropharynx is clear and moist.  Eyes: Conjunctivae and EOM are normal. Pupils are equal, round, and reactive to light. Right eye exhibits no discharge. Left eye exhibits no discharge. No scleral icterus.  Neck: Neck supple. No JVD present.  Cardiovascular: Normal rate, regular rhythm, normal heart sounds and intact distal pulses.  Exam reveals no gallop and no friction rub.   No murmur heard. Pulmonary/Chest: Effort normal and breath sounds normal. No respiratory distress. She has no wheezes. She has no rales.  Abdominal: Soft. Bowel sounds are normal. She exhibits no distension. There is no tenderness. There is no rebound and no guarding.  Musculoskeletal: She exhibits no edema or tenderness.  Lymphadenopathy:    She has no cervical adenopathy.  Neurological: She is alert.  Skin: Skin is warm and dry. No rash noted. No erythema. No pallor.  Psychiatric: She has a normal mood and affect.    Labs reviewed:  Recent Labs  09/19/14 11/26/14 01/23/15  NA 149* 148* 147  K 4.5 4.8 4.6  BUN 32* 38* 28*  CREATININE 0.8 0.9 0.8    Recent Labs  09/19/14  AST 36*  ALT 43*  ALKPHOS 105    Recent Labs  09/19/14 01/23/15  WBC 5.1  5.1 6.2  HGB 11.5*  11.5* 11.7*  HCT 37  37 40  PLT 213 282   Lab Results  Component Value Date   TSH 0.47 09/19/2014   Lab Results  Component Value Date   HGBA1C 5.3 02/10/2015   Lab Results  Component Value Date   CHOL 118 09/19/2014   HDL 40 09/19/2014   LDLCALC 62 09/19/2014   TRIG 80 09/19/2014    Significant Diagnostic Results in last 30 days:  No results found.  Assessment/Plan 1. Essential hypertension, benign B/p stable. Continue lisinopril 10 mg tablet. Monitor BMP  2. Dementia without behavioral disturbance Progressive decline. Continue to assist with ADL's. Aspiration precautions.Fall and safety precautions.  Skin care.    3. Vitamin D deficiency Continue vitamin D supplements. Monitor Vitamin D level.   4. Hyperlipidemia Continue Atorvastatin 10 mg Tablet daily. Monitor Lipid panel.   5. Slow transit constipation Current regimen effective.     Family/ staff Communication: Reviewed plan of care with facility nurse supervisor.  Labs/tests ordered: None

## 2015-05-25 ENCOUNTER — Encounter: Payer: Self-pay | Admitting: Family

## 2015-05-25 ENCOUNTER — Non-Acute Institutional Stay (SKILLED_NURSING_FACILITY): Payer: Medicare Other | Admitting: Family

## 2015-05-25 DIAGNOSIS — K5901 Slow transit constipation: Secondary | ICD-10-CM

## 2015-05-25 DIAGNOSIS — E785 Hyperlipidemia, unspecified: Secondary | ICD-10-CM | POA: Diagnosis not present

## 2015-05-25 DIAGNOSIS — F0390 Unspecified dementia without behavioral disturbance: Secondary | ICD-10-CM

## 2015-05-25 DIAGNOSIS — I10 Essential (primary) hypertension: Secondary | ICD-10-CM

## 2015-05-25 DIAGNOSIS — E559 Vitamin D deficiency, unspecified: Secondary | ICD-10-CM | POA: Diagnosis not present

## 2015-05-25 DIAGNOSIS — F329 Major depressive disorder, single episode, unspecified: Secondary | ICD-10-CM

## 2015-05-25 DIAGNOSIS — F32A Depression, unspecified: Secondary | ICD-10-CM

## 2015-05-25 LAB — HM DIABETES FOOT EXAM

## 2015-05-25 NOTE — Progress Notes (Signed)
Location:  [facility] Room Number: 404 B Place of Service:  SNF [phone]) Provider:  Kolyn Rozario,FNP-C Oneal GroutPANDEY, MAHIMA, MD  Patient Care Team: Oneal GroutMahima Pandey, MD as PCP - General (Internal Medicine) Sharee Holstereborah S Green, NP as Nurse Practitioner (Nurse Practitioner)  Extended Emergency Contact Information Primary Emergency Contact: Conigliaro,LEONA Address: [address]          [place],  [address] Home Phone: [phone] Relation: None  Code Status: Full Code Goals of care: Advanced Directive information Advanced Directives 04/06/2015  Does patient have an advance directive? No  Type of Advance Directive -  Does patient want to make changes to advanced directive? -  Copy of advanced directive(s) in chart? -  Would patient like information on creating an advanced directive? No - patient declined information     Chief Complaint  Patient presents with  . Medical Management of Chronic Issues    Routine Visit    HPI:  Pt is a 80 y.o. female seen today at [facility] for medical management of chronic diseases. She has a medical history of dementia, HTN, CVA, Hypothyroidism, Anemia, Vit D deficiency among others. She is seen in her room today with family member at the bedside braiding patient's hair. She denies any acute issues this visit. Family member  and facility staff report no new concerns.     Past Medical History  Diagnosis Date  . Dementia 07/04/2012  . Anemia 07/04/2012  . Unspecified constipation 07/04/2012  . Unspecified vitamin D deficiency 07/04/2012  . Diabetes mellitus type 2, diet-controlled (HCC) 07/04/2012  . CVA (cerebral vascular accident) (HCC) 07/04/2012  . Unspecified hypothyroidism 07/04/2012  . Cardiac arrhythmia 07/04/2012   No past surgical history on file.  No Known Allergies    Medication List       This list is accurate as of: 05/25/15 10:45 AM.  Always use your most recent med list.                 acetaminophen 325 MG tablet  Commonly known as:  TYLENOL  Take two tablets by mouth twice daily for foot pain. Do not exceed 4gms of Tylenol in 24 hours     atorvastatin 10 MG tablet  Commonly known as:  LIPITOR  Take 1/2 tablet by mouth every evening for Cholesterol     feeding supplement (PRO-STAT SUGAR FREE 64) Liqd  Take 30 mLs by mouth 2 (two) times daily.     ferrous sulfate 325 (65 FE) MG tablet  Take one tablet by mouth twice daily for anemia     lisinopril 10 MG tablet  Commonly known as:  PRINIVIL,ZESTRIL  Take one tablet by mouth once daily for blood pressure     metoprolol tartrate 25 MG tablet  Commonly known as:  LOPRESSOR  Take one tablet by mouth twice daily for blood pressure     polyethylene glycol powder powder  Commonly known as:  GLYCOLAX/MIRALAX  Take 17 g by mouth daily. Mix in 4-8 ounces of Orange Juice and take by mouth daily for constipation.     Rivastigmine 13.3 MG/24HR Pt24  Commonly known as:  EXELON  Place 1 patch (13.3 mg total) onto the skin daily.     therapeutic multivitamin-minerals tablet  Take 1 tablet by mouth daily.     Vitamin D3 50000 units Caps  Take 1 capsule by mouth once a week.        Review of Systems  Constitutional:  Negative for fever, chills, activity change, appetite change and fatigue.  HENT: Negative for congestion, rhinorrhea, sinus pressure, sneezing and sore throat.   Eyes: Negative.   Respiratory: Negative for cough, chest tightness, shortness of breath and wheezing.   Cardiovascular: Negative for chest pain, palpitations and leg swelling.  Gastrointestinal: Negative for nausea, vomiting, abdominal pain, diarrhea, constipation and abdominal distention.  Endocrine: Negative.   Genitourinary: Negative.   Musculoskeletal: Positive for gait problem. Negative for back pain.  Skin: Negative.   Allergic/Immunologic: Negative.   Neurological: Negative.   Hematological: Negative.   Psychiatric/Behavioral:  Negative.     Immunization History  Administered Date(s) Administered  . Influenza-Unspecified 11/29/2012, 11/21/2013, 12/12/2014  . PPD Test 10/27/2012, 11/01/2013, 10/31/2014  . Pneumococcal-Unspecified 02/10/2010   Pertinent  Health Maintenance Due  Topic Date Due  . OPHTHALMOLOGY EXAM  04/21/2015  . HEMOGLOBIN A1C  08/11/2015  . INFLUENZA VACCINE  09/15/2015  . FOOT EXAM  02/18/2016  . PNA vac Low Risk Adult  Addressed   Fall Risk  04/24/2014  Falls in the past year? Exclusion - non ambulatory  Risk for fall due to : Impaired mobility;Impaired balance/gait   Functional Status Survey:    Filed Vitals:   05/25/15 1040  BP: 149/72  Pulse: 64  Temp: 96.6 F (35.9 C)  Resp: 20  Height:  (1.651 m)  Weight: 274 lb 9.6 oz (124.558 kg)  SpO2: 93%   Body mass index is 45.7 kg/(m^2). Physical Exam  Constitutional: She appears well-developed and well-nourished. No distress.  HENT:  Head: Normocephalic.  Mouth/Throat: Oropharynx is clear and moist.  Eyes: Conjunctivae and EOM are normal. Pupils are equal, round, and reactive to light. Right eye exhibits no discharge. Left eye exhibits no discharge. No scleral icterus.  Neck: Normal range of motion. No JVD present.  Cardiovascular: Normal rate, regular rhythm, normal heart sounds and intact distal pulses.  Exam reveals no gallop and no friction rub.   No murmur heard. Pulmonary/Chest: Effort normal and breath sounds normal. No respiratory distress. She has no wheezes. She has no rales.  Abdominal: Soft. Bowel sounds are normal. She exhibits no distension and no mass. There is no tenderness. There is no rebound and no guarding.  Musculoskeletal: Normal range of motion. She exhibits no edema or tenderness.  Lymphadenopathy:    She has no cervical adenopathy.  Neurological: She is alert.  Skin: Skin is warm and dry. No rash noted. No erythema. No pallor.  Bilateral feet dry, flaky skin noted.   Psychiatric: She has a  normal mood and affect.    Labs reviewed:  Recent Labs  09/19/14 11/26/14 01/23/15  NA 149* 148* 147  K 4.5 4.8 4.6  BUN 32* 38* 28*  CREATININE 0.8 0.9 0.8    Recent Labs  09/19/14  AST 36*  ALT 43*  ALKPHOS 105    Recent Labs  09/19/14 01/23/15  WBC 5.1  5.1 6.2  HGB 11.5*  11.5* 11.7*  HCT 37  37 40  PLT 213 282   Lab Results  Component Value Date   TSH 0.47 09/19/2014   Lab Results  Component Value Date   HGBA1C 5.3 02/10/2015   Lab Results  Component Value Date   CHOL 118 09/19/2014   HDL 40 09/19/2014   LDLCALC 62 09/19/2014   TRIG 80 09/19/2014    Significant Diagnostic Results in last 30 days:  No results found.  Assessment/Plan 1.HTN B/p stable. Continue on Lisinopril and Metoprolol. Monitor BMP  2. Hyperlipidemia Continue on atorvastatin 10 mg Tablet. Monitor Lipid panel.  3. Depression Stable. No mood changes reported. Continue to monitor.  4. Constipation  Current regimen effective. Continue current medication and monitor.   5. Dementia  No new behavioral issues. Continue on Exelon patch. Continue to assist with ADL's. Fall and safety precautions. Skin Care.  6. Vit D deficiency Continue on Vit D 50,000 units weekly. Monitor Vit D level    Family/ staff Communication:Reviewed plan with patient and facility Nurse supervisor.   Labs/tests ordered:  None

## 2015-06-16 ENCOUNTER — Encounter: Payer: Self-pay | Admitting: Family

## 2015-06-16 ENCOUNTER — Non-Acute Institutional Stay (SKILLED_NURSING_FACILITY): Payer: Medicare Other | Admitting: Family

## 2015-06-16 DIAGNOSIS — R05 Cough: Secondary | ICD-10-CM | POA: Diagnosis not present

## 2015-06-16 DIAGNOSIS — R059 Cough, unspecified: Secondary | ICD-10-CM

## 2015-06-16 NOTE — Progress Notes (Signed)
Location:  [facility] Room Number: 404 B Place of Service:  SNF [phone]) Provider:  Richarda Blade, FNP-C  Oneal Grout, MD  Patient Care Team: Oneal Grout, MD as PCP - General (Internal Medicine) Sharee Holster, NP as Nurse Practitioner (Nurse Practitioner)  Extended Emergency Contact Information Primary Emergency Contact: Hershman,LEONA Address: [address]          [name] Home Phone: [phone] Relation: None  Code Status: DNR  Goals of care: Advanced Directive information Advanced Directives 04/06/2015  Does patient have an advance directive? No  Type of Advance Directive -  Does patient want to make changes to advanced directive? -  Copy of advanced directive(s) in chart? -  Would patient like information on creating an advanced directive? No - patient declined information     Chief Complaint  Patient presents with  . Acute Visit    Follow-up on Cough    HPI:  Pt is a 80 y.o. female seen today at [facility] for an acute visit for evaluation of cough.She is seen in her room today per facility Nurse request. She complains of non-productive cough for several days. She has used Robitussin without any relief of symptoms. She denies any fever, chills, wheezing or shortness of breath.   Past Medical History  Diagnosis Date  . Dementia 07/04/2012  . Anemia 07/04/2012  . Unspecified constipation 07/04/2012  . Unspecified vitamin D deficiency 07/04/2012  . Diabetes mellitus type 2, diet-controlled (HCC) 07/04/2012  . CVA (cerebral vascular accident) (HCC) 07/04/2012  . Unspecified hypothyroidism 07/04/2012  . Cardiac arrhythmia 07/04/2012   No past surgical history on file.  No Known Allergies    Medication List       This list is accurate as of: 06/16/15  1:34 PM.  Always use your most recent med list.               acetaminophen 325 MG tablet  Commonly known as:  TYLENOL  Take two tablets by  mouth twice daily for foot pain. Do not exceed 4gms of Tylenol in 24 hours     atorvastatin 10 MG tablet  Commonly known as:  LIPITOR  Take 1/2 tablet by mouth every evening for Cholesterol     feeding supplement (PRO-STAT SUGAR FREE 64) Liqd  Take 30 mLs by mouth 2 (two) times daily.     ferrous sulfate 325 (65 FE) MG tablet  Take one tablet by mouth twice daily for anemia     lisinopril 10 MG tablet  Commonly known as:  PRINIVIL,ZESTRIL  Take one tablet by mouth once daily for blood pressure     metoprolol tartrate 25 MG tablet  Commonly known as:  LOPRESSOR  Take one tablet by mouth twice daily for blood pressure     mineral oil-hydrophilic petrolatum ointment  Apply 1 application topically 2 (two) times daily as needed for dry skin. Take for 4 weeks  Stop date 06/22/2015     polyethylene glycol powder powder  Commonly known as:  GLYCOLAX/MIRALAX  Take 17 g by mouth daily. Mix in 4-8 ounces of Orange Juice and take by mouth daily for constipation.     Rivastigmine 13.3 MG/24HR Pt24  Commonly known as:  EXELON  Place 1 patch (13.3 mg total) onto the skin daily.     therapeutic multivitamin-minerals tablet  Take 1 tablet by mouth daily.     Vitamin D3 50000 units Caps  Take 1  capsule by mouth once a week.     zinc oxide 11.3 % Crea cream  Commonly known as:  BALMEX  Apply topically to buttocks twice a day for incontinence protection        Review of Systems  Constitutional: Negative for activity change, appetite change, chills, fatigue and fever.  HENT: Negative for congestion, rhinorrhea, sinus pressure, sneezing and sore throat.   Eyes: Negative.   Respiratory: Positive for cough. Negative for chest tightness, shortness of breath and wheezing.   Cardiovascular: Negative for chest pain, palpitations and leg swelling.  Gastrointestinal: Negative for abdominal distention, abdominal pain, constipation, diarrhea, nausea and vomiting.  Genitourinary: Negative.     Musculoskeletal: Positive for gait problem. Negative for back pain.  Skin: Negative.   Psychiatric/Behavioral: Negative.     Immunization History  Administered Date(s) Administered  . Influenza-Unspecified 11/29/2012, 11/21/2013, 12/12/2014  . PPD Test 10/27/2012, 11/01/2013, 10/31/2014  . Pneumococcal-Unspecified 02/10/2010   Pertinent  Health Maintenance Due  Topic Date Due  . OPHTHALMOLOGY EXAM  04/21/2015  . HEMOGLOBIN A1C  08/11/2015  . INFLUENZA VACCINE  09/15/2015  . FOOT EXAM  02/18/2016  . PNA vac Low Risk Adult  Addressed   Fall Risk  04/24/2014  Falls in the past year? Exclusion - non ambulatory  Risk for fall due to : Impaired mobility;Impaired balance/gait      Filed Vitals:   06/16/15 1306  BP: 149/72  Pulse: 64  Temp: 96.6 F (35.9 C)  Resp: 20  Height:  (1.651 m)  Weight: 274 lb (124.286 kg)  SpO2: 93%   Body mass index is 45.6 kg/(m^2). Physical Exam  Constitutional: She appears well-developed and well-nourished. No distress.  HENT:  Head: Normocephalic.  Mouth/Throat: Oropharynx is clear and moist.  Eyes: Conjunctivae and EOM are normal. Pupils are equal, round, and reactive to light. Right eye exhibits no discharge. Left eye exhibits no discharge. No scleral icterus.  Neck: Normal range of motion. No JVD present.  Cardiovascular: Normal rate, regular rhythm, normal heart sounds and intact distal pulses.  Exam reveals no gallop and no friction rub.   No murmur heard. Pulmonary/Chest: Effort normal. No respiratory distress. She has no wheezes. She has no rales.  Bilat. Diminished lung bases to auscultation.   Abdominal: Soft. Bowel sounds are normal. She exhibits no distension and no mass. There is no tenderness. There is no rebound and no guarding.  Musculoskeletal: Normal range of motion. She exhibits no edema or tenderness.  Lymphadenopathy:    She has no cervical adenopathy.  Neurological: She is alert.  Skin: Skin is warm and dry. No  rash noted. No erythema. No pallor.  Bilateral feet dry, flaky skin noted.   Psychiatric: She has a normal mood and affect.    Labs reviewed:  Recent Labs  09/19/14 11/26/14 01/23/15  NA 149* 148* 147  K 4.5 4.8 4.6  BUN 32* 38* 28*  CREATININE 0.8 0.9 0.8    Recent Labs  09/19/14  AST 36*  ALT 43*  ALKPHOS 105    Recent Labs  09/19/14 01/23/15  WBC 5.1  5.1 6.2  HGB 11.5*  11.5* 11.7*  HCT 37  37 40  PLT 213 282   Lab Results  Component Value Date   TSH 0.47 09/19/2014   Lab Results  Component Value Date   HGBA1C 5.3 02/10/2015   Lab Results  Component Value Date   CHOL 118 09/19/2014   HDL 40 09/19/2014   LDLCALC 62 09/19/2014  TRIG 80 09/19/2014   Assessment/Plan Cough  Afebrile. Non-productive. Bilateral lower base lung field diminished breath sounds. Obtain portable CXR Pa/lat r/o PNA    Family/ staff Communication: Reviewed plan of care with patient and facility Nurse supervisor.  Labs/tests ordered:  portable CXR Pa/lat

## 2015-06-24 ENCOUNTER — Non-Acute Institutional Stay (SKILLED_NURSING_FACILITY): Payer: Medicare Other | Admitting: Family

## 2015-06-24 ENCOUNTER — Encounter: Payer: Self-pay | Admitting: Family

## 2015-06-24 DIAGNOSIS — K5901 Slow transit constipation: Secondary | ICD-10-CM

## 2015-06-24 DIAGNOSIS — I10 Essential (primary) hypertension: Secondary | ICD-10-CM | POA: Diagnosis not present

## 2015-06-24 DIAGNOSIS — F0390 Unspecified dementia without behavioral disturbance: Secondary | ICD-10-CM

## 2015-06-24 DIAGNOSIS — E119 Type 2 diabetes mellitus without complications: Secondary | ICD-10-CM | POA: Diagnosis not present

## 2015-06-24 NOTE — Progress Notes (Signed)
Location:  [facility] Room Number: 404 B Place of Service:  SNF [phone]) Provider:  Richarda Blade, NP  Oneal Grout, MD  Patient Care Team: Oneal Grout, MD as PCP - General (Internal Medicine) Sharee Holster, NP as Nurse Practitioner (Nurse Practitioner)  Extended Emergency Contact Information Primary Emergency Contact: Moorehead,LEONA Address: [address]          [name] Home Phone: [phone] Relation: None  Code Status: Full Code  Goals of care: Advanced Directive information Advanced Directives 04/06/2015  Does patient have an advance directive? No  Type of Advance Directive -  Does patient want to make changes to advanced directive? -  Copy of advanced directive(s) in chart? -  Would patient like information on creating an advanced directive? No - patient declined information     Chief Complaint  Patient presents with  . Medical Management of Chronic Issues    Routine Visit    HPI:  Pt is a 80 y.o. female seen today at [facility]  for medical management of chronic diseases. She has a medical history of HTN, Constipation, Type 2 DM , Dementia, Hyperlipidemia, Anemia, CVA among others. She is seen in her room today in bed watching TV. She denies any acute issues. She state previous cough resolved. Facility staff reports patient prefers to stay in bed and watch TV.She denies any fever or chills.    Past Medical History  Diagnosis Date  . Dementia 07/04/2012  . Anemia 07/04/2012  . Unspecified constipation 07/04/2012  . Unspecified vitamin D deficiency 07/04/2012  . Diabetes mellitus type 2, diet-controlled (HCC) 07/04/2012  . CVA (cerebral vascular accident) (HCC) 07/04/2012  . Unspecified hypothyroidism 07/04/2012  . Cardiac arrhythmia 07/04/2012   No past surgical history on file.  No Known Allergies    Medication List       This list is accurate as of: 06/24/15  8:45 AM.  Always use your most  recent med list.               acetaminophen 325 MG tablet  Commonly known as:  TYLENOL  Take two tablets by mouth twice daily for foot pain. Do not exceed 4gms of Tylenol in 24 hours     atorvastatin 10 MG tablet  Commonly known as:  LIPITOR  Take 1/2 tablet by mouth every evening for Cholesterol     feeding supplement (PRO-STAT SUGAR FREE 64) Liqd  Take 30 mLs by mouth 2 (two) times daily.     ferrous sulfate 325 (65 FE) MG tablet  Take one tablet by mouth twice daily for anemia     lisinopril 10 MG tablet  Commonly known as:  PRINIVIL,ZESTRIL  Take 10 mg by mouth daily.     metoprolol tartrate 25 MG tablet  Commonly known as:  LOPRESSOR  Take 25 mg by mouth 2 (two) times daily.     Mineral Oil-Hydrophil Petrolat Oint  Apply to both feet twice daily x 4 weeks for dry flaky skin. Stop Date 06/24/15     polyethylene glycol powder powder  Commonly known as:  GLYCOLAX/MIRALAX  Take 17 g by mouth daily. Mix in 4-8 ounces of Orange Juice and take by mouth daily for constipation.     Rivastigmine 13.3 MG/24HR Pt24  Commonly known as:  EXELON  Place 1 patch (13.3 mg total) onto the skin daily.     therapeutic multivitamin-minerals tablet  Take 1 tablet by mouth daily.  Vitamin D3 50000 units Caps  Take 1 capsule by mouth once a week.     zinc oxide 11.3 % Crea cream  Commonly known as:  BALMEX  Apply topically to buttocks twice a day for incontinence protection        Review of Systems  Constitutional: Negative for fever, chills, activity change, appetite change and fatigue.  HENT: Negative for congestion, rhinorrhea, sinus pressure, sneezing and sore throat.   Eyes: Negative.   Respiratory: Negative for cough, chest tightness, shortness of breath and wheezing.   Cardiovascular: Negative for chest pain, palpitations and leg swelling.  Gastrointestinal: Negative for nausea, vomiting, abdominal pain, diarrhea, constipation and abdominal distention.  Endocrine:  Negative.   Genitourinary: Negative.   Musculoskeletal: Positive for gait problem. Negative for back pain.  Skin: Negative.   Allergic/Immunologic: Negative.   Neurological: Negative.   Hematological: Negative.   Psychiatric/Behavioral: Negative.     Immunization History  Administered Date(s) Administered  . Influenza-Unspecified 11/29/2012, 11/21/2013, 12/12/2014  . PPD Test 10/27/2012, 11/01/2013, 10/31/2014  . Pneumococcal-Unspecified 02/10/2010   Pertinent  Health Maintenance Due  Topic Date Due  . OPHTHALMOLOGY EXAM  02/15/2016 (Originally 04/21/2015)  . HEMOGLOBIN A1C  08/11/2015  . INFLUENZA VACCINE  09/15/2015  . FOOT EXAM  02/18/2016  . PNA vac Low Risk Adult  Addressed   Fall Risk  04/24/2014  Falls in the past year? Exclusion - non ambulatory  Risk for fall due to : Impaired mobility;Impaired balance/gait   Functional Status Survey:    Filed Vitals:   06/24/15 0823  BP: 152/67  Pulse: 62  Temp: 97.9 F (36.6 C)  Resp: 18  Height: 5\' 5"  (1.651 m)  Weight: 273 lb 9.6 oz (124.104 kg)  SpO2: 97%   Body mass index is 45.53 kg/(m^2). Physical Exam  Constitutional:  Morbid obese Elderly in no acute distress.  HENT:  Head: Normocephalic.  Mouth/Throat: Oropharynx is clear and moist.  Eyes: Conjunctivae and EOM are normal. Pupils are equal, round, and reactive to light. Right eye exhibits no discharge. Left eye exhibits no discharge. No scleral icterus.  Neck: Normal range of motion. No JVD present. No thyromegaly present.  Cardiovascular: Normal rate, regular rhythm, normal heart sounds and intact distal pulses.  Exam reveals no gallop and no friction rub.   No murmur heard. Pulmonary/Chest: Effort normal and breath sounds normal. No respiratory distress. She has no wheezes. She has no rales.  Abdominal: Soft. Bowel sounds are normal. She exhibits no distension. There is no tenderness. There is no rebound and no guarding.  Musculoskeletal: She exhibits no edema  or tenderness.  Lymphadenopathy:    She has no cervical adenopathy.  Neurological: She is alert.  Skin: Skin is warm and dry. No rash noted. No erythema. No pallor.  Psychiatric: She has a normal mood and affect.    Labs reviewed:  Recent Labs  09/19/14 11/26/14 01/23/15  NA 149* 148* 147  K 4.5 4.8 4.6  BUN 32* 38* 28*  CREATININE 0.8 0.9 0.8    Recent Labs  09/19/14  AST 36*  ALT 43*  ALKPHOS 105    Recent Labs  09/19/14 01/23/15  WBC 5.1  5.1 6.2  HGB 11.5*  11.5* 11.7*  HCT 37  37 40  PLT 213 282   Lab Results  Component Value Date   TSH 0.47 09/19/2014   Lab Results  Component Value Date   HGBA1C 5.3 02/10/2015   Lab Results  Component Value Date   CHOL  118 09/19/2014   HDL 40 09/19/2014   LDLCALC 62 09/19/2014   TRIG 80 09/19/2014    Assessment/Plan HTN B/p stable. Continue on Lisinopril 10 mg tablet, Metoprolol 25 mg Tablet. Monitor BMP  Constipation Miralax effective. Continue to encourage oral intake.   Type 2 DM CBG's ranging in the 90's. Currently off meds. Continue to monitor. Check Hgb A1C   Dementia No new behavioral issues reported. Continue on Exelon 13.3mg  patch. Continue to assist with ADL's. Fall and safety precautions. Skin Care.    Family/ staff Communication: Reviewed plan with patient and facility Nurse supervisor.  Labs/tests ordered:  None

## 2015-07-24 LAB — CBC AND DIFFERENTIAL
HCT: 39 % (ref 36–46)
HEMOGLOBIN: 11.6 g/dL — AB (ref 12.0–16.0)
Platelets: 283 10*3/uL (ref 150–399)
WBC: 6.2 10^3/mL

## 2015-07-24 LAB — LIPID PANEL
CHOLESTEROL: 154 mg/dL (ref 0–200)
HDL: 37 mg/dL (ref 35–70)
LDL Cholesterol: 104 mg/dL
TRIGLYCERIDES: 65 mg/dL (ref 40–160)

## 2015-07-24 LAB — BASIC METABOLIC PANEL
BUN: 25 mg/dL — AB (ref 4–21)
Creatinine: 0.8 mg/dL (ref 0.5–1.1)
Glucose: 89 mg/dL
POTASSIUM: 4.2 mmol/L (ref 3.4–5.3)
Sodium: 150 mmol/L — AB (ref 137–147)

## 2015-07-24 LAB — HEMOGLOBIN A1C: Hemoglobin A1C: 5.3

## 2015-07-24 LAB — TSH: TSH: 0.01 u[IU]/mL — AB (ref 0.41–5.90)

## 2015-07-27 ENCOUNTER — Encounter: Payer: Self-pay | Admitting: Internal Medicine

## 2015-07-27 ENCOUNTER — Non-Acute Institutional Stay (SKILLED_NURSING_FACILITY): Payer: Medicare Other | Admitting: Internal Medicine

## 2015-07-27 DIAGNOSIS — F0280 Dementia in other diseases classified elsewhere without behavioral disturbance: Secondary | ICD-10-CM

## 2015-07-27 DIAGNOSIS — E785 Hyperlipidemia, unspecified: Secondary | ICD-10-CM

## 2015-07-27 DIAGNOSIS — K5900 Constipation, unspecified: Secondary | ICD-10-CM

## 2015-07-27 DIAGNOSIS — I10 Essential (primary) hypertension: Secondary | ICD-10-CM | POA: Diagnosis not present

## 2015-07-27 DIAGNOSIS — E119 Type 2 diabetes mellitus without complications: Secondary | ICD-10-CM | POA: Diagnosis not present

## 2015-07-27 DIAGNOSIS — D638 Anemia in other chronic diseases classified elsewhere: Secondary | ICD-10-CM | POA: Diagnosis not present

## 2015-07-27 DIAGNOSIS — E46 Unspecified protein-calorie malnutrition: Secondary | ICD-10-CM

## 2015-07-27 DIAGNOSIS — E559 Vitamin D deficiency, unspecified: Secondary | ICD-10-CM | POA: Diagnosis not present

## 2015-07-27 DIAGNOSIS — G300 Alzheimer's disease with early onset: Secondary | ICD-10-CM | POA: Diagnosis not present

## 2015-07-27 DIAGNOSIS — K5909 Other constipation: Secondary | ICD-10-CM | POA: Insufficient documentation

## 2015-07-27 NOTE — Progress Notes (Signed)
Patient ID: Sylvia Medina, female   DOB: 1935/12/18, 80 y.o.   MRN: [id number]     Facility: [facility]   Chief Complaint  Patient presents with  . Medical Management of Chronic Issues    Routine Visit   No Known Allergies   Code status: full code  Advanced Directives 07/27/2015  Does patient have an advance directive? Yes  Type of Advance Directive Out of facility DNR (pink MOST or yellow form)  Does patient want to make changes to advanced directive? No - Patient declined  Copy of advanced directive(s) in chart? Yes  Would patient like information on creating an advanced directive? -   HPI 80 y/o female pt is seen today for routine visit. She denies any concerns. No concerns from nursing saff. She has dementia and is pleasantly confused. No falls reported. No new skin concern. Refuses to get out of bed. She is under total care. She denies any concern this visit. She feeds herself.   ROS Constitutional: Negative for fever, chills, diaphoresis.  Respiratory: Negative for cough, shortness of breath and wheezing.   Cardiovascular: Negative for chest pain, palpitations Gastrointestinal: Negative for heartburn, nausea, vomiting, abdominal pain Genitourinary: Negative for dysuria  Musculoskeletal: Negative for back pain, falls Skin: Negative for itching and rash.  Neurological: Negative for dizziness,and headaches.  Psychiatric/Behavioral: negative for depression    Past Medical History  Diagnosis Date  . Dementia 07/04/2012  . Anemia 07/04/2012  . Unspecified constipation 07/04/2012  . Unspecified vitamin D deficiency 07/04/2012  . Diabetes mellitus type 2, diet-controlled (HCC) 07/04/2012  . CVA (cerebral vascular accident) (HCC) 07/04/2012  . Unspecified hypothyroidism 07/04/2012  . Cardiac arrhythmia 07/04/2012   No past surgical history on file.   Medication List       This list is accurate as of: 07/27/15  3:17 PM.  Always use your most recent  med list.               acetaminophen 325 MG tablet  Commonly known as:  TYLENOL  Take two tablets by mouth twice daily for foot pain. Do not exceed 4gms of Tylenol in 24 hours     atorvastatin 10 MG tablet  Commonly known as:  LIPITOR  Take 1/2 tablet by mouth every evening for Cholesterol     feeding supplement (PRO-STAT SUGAR FREE 64) Liqd  Take 30 mLs by mouth 2 (two) times daily.     ferrous sulfate 325 (65 FE) MG tablet  Take one tablet by mouth twice daily for anemia     lisinopril 10 MG tablet  Commonly known as:  PRINIVIL,ZESTRIL  Take 10 mg by mouth daily.     metoprolol tartrate 25 MG tablet  Commonly known as:  LOPRESSOR  Take 25 mg by mouth 2 (two) times daily.     polyethylene glycol powder powder  Commonly known as:  GLYCOLAX/MIRALAX  Take 17 g by mouth daily. Mix in 4-8 ounces of Orange Juice and take by mouth daily for constipation.     Rivastigmine 13.3 MG/24HR Pt24  Commonly known as:  EXELON  Place 1 patch (13.3 mg total) onto the skin daily.     therapeutic multivitamin-minerals tablet  Take 1 tablet by mouth daily.     Vitamin D3 50000 units Caps  Take 1 capsule by mouth once a week.       Physical exam BP 179/78 mmHg  Pulse 67  Ht 5\' 5"  (1.651 m)  Wt 274 lb  9.6 oz (124.558 kg)  BMI 45.70 kg/m2  Wt Readings from Last 3 Encounters:  07/27/15 274 lb 9.6 oz (124.558 kg)  06/24/15 273 lb 9.6 oz (124.104 kg)  06/16/15 274 lb (124.286 kg)   General- elderly female in no acute distress, obese Neck- no lymphadenopathy Cardiovascular- irregular but controlled rate, no murmurs, distal pulses intact, 1+ leg edema Respiratory- bilateral clear to auscultation, no wheeze, no rhonchi, no crackles, no use of accessory muscles Abdomen- bowel sounds present, soft, non tender Musculoskeletal- able to move all 4 extremities, mainly in bed and requires assistance with her ADLS Neurological- she is alert Skin- warm and dry Psychiatry- normal mood and  affect  Labs  CBC Latest Ref Rng 01/23/2015 09/19/2014 09/19/2014  WBC - 6.2 5.1 5.1  Hemoglobin 12.0 - 16.0 g/dL 11.7(A) 11.5(A) 11.5(A)  Hematocrit 36 - 46 % 40 37 37  Platelets 150 - 399 K/L 282 - 213   CMP Latest Ref Rng 01/23/2015 11/26/2014 09/19/2014  Glucose 70 - 99 mg/dL - - -  BUN 4 - 21 mg/dL 16(X) 09(U) 04(V)  Creatinine 0.5 - 1.1 mg/dL 0.8 0.9 0.8  Sodium 409 - 147 mmol/L 147 148(A) 149(A)  Potassium 3.4 - 5.3 mmol/L 4.6 4.8 4.5  Chloride 96 - 112 mEq/L - - -  CO2 19 - 32 mEq/L - - -  Calcium 8.4 - 10.5 mg/dL - - -  Alkaline Phos 25 - 125 U/L - - 105  AST 13 - 35 U/L - - 36(A)  ALT 7 - 35 U/L - - 43(A)   Lab Results  Component Value Date   TSH 0.47 09/19/2014   Lipid Panel     Component Value Date/Time   CHOL 118 09/19/2014   TRIG 80 09/19/2014   HDL 40 09/19/2014   LDLCALC 62 09/19/2014     Assessment/Plan  Dementia Continue exelon patch and provide supportive care for now.  HTN Elevated bp reading today. Currently on lisinopril 10 mg daily and metoprolol 25 mg bid. Check bp bid x 2 weeks and adjust med if needed  Hyperlipidemia Continue lipitor 5 mg daily, check lipid panel  Anemia of chronic disease Continue ferrous sulfate 325 mg bid for now and check cbc   Vitamin d def Check vitamin d level, continue vitamin d supplement  Dm 2 Diet controlled, check a1c and bmp  Chronic constipation Continue miralax daily, maintain hydration  Protein calorie malnutrition Check bmp, prealbumin level. Continue feeding supplement for now   Oneal Grout, MD  [facility] [phone] (Monday-Friday 8 am - 5 pm) [phone] (afterhours)

## 2015-08-03 LAB — BASIC METABOLIC PANEL
BUN: 40 mg/dL — AB (ref 4–21)
CREATININE: 0.9 mg/dL (ref 0.5–1.1)
Glucose: 95 mg/dL
POTASSIUM: 4 mmol/L (ref 3.4–5.3)
SODIUM: 150 mmol/L — AB (ref 137–147)

## 2015-08-11 LAB — BASIC METABOLIC PANEL
BUN: 40 mg/dL — AB (ref 4–21)
CREATININE: 1.1 mg/dL (ref 0.5–1.1)
Glucose: 93 mg/dL
Potassium: 4 mmol/L (ref 3.4–5.3)
Sodium: 154 mmol/L — AB (ref 137–147)

## 2015-08-11 LAB — HEPATIC FUNCTION PANEL
ALK PHOS: 213 U/L — AB (ref 25–125)
ALT: 38 U/L — AB (ref 7–35)
AST: 34 U/L (ref 13–35)
BILIRUBIN, TOTAL: 0.3 mg/dL

## 2015-08-12 ENCOUNTER — Encounter: Payer: Self-pay | Admitting: Family

## 2015-08-12 ENCOUNTER — Non-Acute Institutional Stay (SKILLED_NURSING_FACILITY): Payer: Medicare Other | Admitting: Family

## 2015-08-12 DIAGNOSIS — E46 Unspecified protein-calorie malnutrition: Secondary | ICD-10-CM | POA: Diagnosis not present

## 2015-08-12 DIAGNOSIS — E870 Hyperosmolality and hypernatremia: Secondary | ICD-10-CM | POA: Diagnosis not present

## 2015-08-12 NOTE — Progress Notes (Signed)
Patient ID: Sylvia Medina, female   DOB: Oct 01, 1935, 80 y.o.   MRN: [id number]  Location:    [facility] Room Number: 404-B Place of Service:  SNF [phone] Provider:  Merril Isakson FNP-C   Oneal GroutPANDEY, MAHIMA, MD  Patient Care Team: Oneal GroutMahima Pandey, MD as PCP - General (Internal Medicine) Sharee Holstereborah S Green, NP as Nurse Practitioner (Nurse Practitioner)  Extended Emergency Contact Information Primary Emergency Contact: Martone,LEONA Address: [address]          [place],  [address] Home Phone: [phone] Relation: None  Code Status: DNR  Goals of care: Advanced Directive information Advanced Directives 07/27/2015  Does patient have an advance directive? Yes  Type of Advance Directive Out of facility DNR (pink MOST or yellow form)  Does patient want to make changes to advanced directive? No - Patient declined  Copy of advanced directive(s) in chart? Yes  Would patient like information on creating an advanced directive? -     Chief Complaint  Patient presents with  . Acute Visit    Abnormal labs    HPI:  Pt is a 80 y.o. female seen today at  [facility] for an acute visit for evaluation of abnormal lab results. She is seen in her room today. She denies any acute issues. Her recent repeat lab results showed worsening elevated sodium level. Patient states does not like to drink water. Na 154 previous 150 ( 08/03/2015), BUN 40, TP 5.5, Alb 2.35 ( 08/11/2015). Facility staff reports no new concerns.    Past Medical History  Diagnosis Date  . Dementia 07/04/2012  . Anemia 07/04/2012  . Unspecified constipation 07/04/2012  . Unspecified vitamin D deficiency 07/04/2012  . Diabetes mellitus type 2, diet-controlled (HCC) 07/04/2012  . CVA (cerebral vascular accident) (HCC) 07/04/2012  . Unspecified hypothyroidism 07/04/2012  . Cardiac arrhythmia 07/04/2012   No past surgical history on file.  No Known Allergies    Medication List         This list is accurate as of: 08/12/15  4:16 PM.  Always use your most recent med list.               acetaminophen 325 MG tablet  Commonly known as:  TYLENOL  Take two tablets by mouth twice daily for foot pain. Do not exceed 4gms of Tylenol in 24 hours     atorvastatin 10 MG tablet  Commonly known as:  LIPITOR  Take 10 mg by mouth daily.     feeding supplement (PRO-STAT SUGAR FREE 64) Liqd  Take 30 mLs by mouth 2 (two) times daily.     ferrous sulfate 325 (65 FE) MG tablet  Take one tablet by mouth twice daily for anemia     lisinopril 10 MG tablet  Commonly known as:  PRINIVIL,ZESTRIL  Take 10 mg by mouth daily.     metoprolol tartrate 25 MG tablet  Commonly known as:  LOPRESSOR  Take 25 mg by mouth 2 (two) times daily.     polyethylene glycol powder powder  Commonly known as:  GLYCOLAX/MIRALAX  Take 17 g by mouth daily. Mix in 4-8 ounces of Orange Juice and take by mouth daily for constipation.     Rivastigmine 13.3 MG/24HR Pt24  Commonly known as:  EXELON  Place 1 patch (13.3 mg total) onto the skin daily.     therapeutic multivitamin-minerals tablet  Take 1 tablet by mouth daily.     Vitamin D3 50000  units Caps  Take 1 capsule by mouth once a week.        Review of Systems  Constitutional: Negative for fever, chills, activity change, appetite change and fatigue.  HENT: Negative for congestion, rhinorrhea, sinus pressure, sneezing and sore throat.   Eyes: Negative.   Respiratory: Negative for cough, chest tightness, shortness of breath and wheezing.   Cardiovascular: Negative for chest pain, palpitations and leg swelling.  Gastrointestinal: Negative for nausea, vomiting, abdominal pain, diarrhea, constipation and abdominal distention.  Endocrine: Negative.   Genitourinary: Negative.   Musculoskeletal: Positive for gait problem. Negative for back pain.  Skin: Negative.   Allergic/Immunologic: Negative.   Neurological: Negative.   Hematological: Negative.    Psychiatric/Behavioral: Negative.     Immunization History  Administered Date(s) Administered  . Influenza-Unspecified 11/29/2012, 11/21/2013, 12/12/2014  . PPD Test 10/27/2012, 11/01/2013, 10/31/2014  . Pneumococcal-Unspecified 02/10/2010   Pertinent  Health Maintenance Due  Topic Date Due  . OPHTHALMOLOGY EXAM  02/15/2016 (Originally 04/21/2015)  . INFLUENZA VACCINE  09/15/2015  . HEMOGLOBIN A1C  01/23/2016  . FOOT EXAM  05/24/2016  . PNA vac Low Risk Adult  Addressed   Fall Risk  04/24/2014  Falls in the past year? Exclusion - non ambulatory  Risk for fall due to : Impaired mobility;Impaired balance/gait   Functional Status Survey:    Filed Vitals:   08/12/15 1109  BP: 147/60  Pulse: 83  Temp: 98 F (36.7 C)  TempSrc: Oral  Resp: 18  Height: 5\' 5"  (1.651 m)  Weight: 274 lb 9.6 oz (124.558 kg)  SpO2: 91%   Body mass index is 45.7 kg/(m^2). Physical Exam  Constitutional:  Morbid obese Elderly in no acute distress.  HENT:  Head: Normocephalic.  Mouth/Throat: Oropharynx is clear and moist.  Eyes: Conjunctivae and EOM are normal. Pupils are equal, round, and reactive to light. Right eye exhibits no discharge. Left eye exhibits no discharge. No scleral icterus.  Neck: Normal range of motion. No JVD present. No thyromegaly present.  Cardiovascular: Normal rate, regular rhythm, normal heart sounds and intact distal pulses.  Exam reveals no gallop and no friction rub.   No murmur heard. Pulmonary/Chest: Effort normal and breath sounds normal. No respiratory distress. She has no wheezes. She has no rales.  Abdominal: Soft. Bowel sounds are normal. She exhibits no distension. There is no tenderness. There is no rebound and no guarding.  Musculoskeletal: She exhibits no edema.  Moves all extremities X 4 Bilateral feet tender to touch.   Lymphadenopathy:    She has no cervical adenopathy.  Neurological: She is alert.  Skin: Skin is warm and dry. No rash noted. No erythema.  No pallor.  Psychiatric: She has a normal mood and affect.    Labs reviewed:  Recent Labs  07/24/15 08/03/15 08/11/15  NA 150* 150* 154*  K 4.2 4.0 4.0  BUN 25* 40* 40*  CREATININE 0.8 0.9 1.1    Recent Labs  09/19/14 08/11/15  AST 36* 34  ALT 43* 38*  ALKPHOS 105 213*    Recent Labs  09/19/14 01/23/15 07/24/15  WBC 5.1  5.1 6.2 6.2  HGB 11.5*  11.5* 11.7* 11.6*  HCT 37  37 40 39  PLT 213 282 283   Lab Results  Component Value Date   TSH 0.01* 07/24/2015   Lab Results  Component Value Date   HGBA1C 5.3 07/24/2015   Lab Results  Component Value Date   CHOL 154 07/24/2015   HDL 37 07/24/2015  LDLCALC 104 07/24/2015   TRIG 65 07/24/2015    Significant Diagnostic Results in last 30 days:  No results found.  Assessment/Plan 1. Hypernatremia Worsening.VSS. Na 154 previous 150 ( 08/03/2015), BUN 40 ( 08/11/2015).Start IVF 1/2 NS@ 70 ml/HR  X 1 liter. Recheck BMP once IVF completed.   2. Protein-calorie malnutrition (HCC) TP 5.5, Alb 2.35 ( 08/11/2015). RD consulted for protein supplement.     Family/ staff Communication: Reviewed plan of care with patient and facility Nurse supervisor.  Labs/tests ordered:  BMP once IVF completed.

## 2015-08-17 LAB — BASIC METABOLIC PANEL
BUN: 35 mg/dL — AB (ref 4–21)
Creatinine: 1 mg/dL (ref <=1.1)
Glucose: 112 mg/dL
POTASSIUM: 4 mmol/L (ref 3.4–5.3)
SODIUM: 151 mmol/L — AB (ref 137–147)

## 2015-08-31 ENCOUNTER — Non-Acute Institutional Stay (SKILLED_NURSING_FACILITY): Payer: Medicare Other | Admitting: Family

## 2015-08-31 ENCOUNTER — Encounter: Payer: Self-pay | Admitting: Family

## 2015-08-31 DIAGNOSIS — F0390 Unspecified dementia without behavioral disturbance: Secondary | ICD-10-CM

## 2015-08-31 DIAGNOSIS — E785 Hyperlipidemia, unspecified: Secondary | ICD-10-CM

## 2015-08-31 DIAGNOSIS — I10 Essential (primary) hypertension: Secondary | ICD-10-CM

## 2015-08-31 DIAGNOSIS — K5901 Slow transit constipation: Secondary | ICD-10-CM | POA: Diagnosis not present

## 2015-08-31 DIAGNOSIS — D638 Anemia in other chronic diseases classified elsewhere: Secondary | ICD-10-CM

## 2015-08-31 DIAGNOSIS — E870 Hyperosmolality and hypernatremia: Secondary | ICD-10-CM

## 2015-08-31 DIAGNOSIS — F32A Depression, unspecified: Secondary | ICD-10-CM

## 2015-08-31 DIAGNOSIS — E559 Vitamin D deficiency, unspecified: Secondary | ICD-10-CM

## 2015-08-31 DIAGNOSIS — F329 Major depressive disorder, single episode, unspecified: Secondary | ICD-10-CM

## 2015-08-31 MED ORDER — CHOLECALCIFEROL 25 MCG (1000 UT) PO TABS: 1000.0000 [IU] | ORAL_TABLET | Freq: Every day | ORAL | Status: DC

## 2015-08-31 NOTE — Progress Notes (Signed)
Location:    [facility] Room Number: 404-B Place of Service:  SNF (31) Provider: Richarda Blade FNP-C   Patient Care Team: Oneal Grout, MD as PCP - General (Internal Medicine) Sharee Holster, NP as Nurse Practitioner (Nurse Practitioner)  Extended Emergency Contact Information Primary Emergency Contact: Steers,LEONA Address: [address]          [name] Home Phone: [phone] Relation: None  Code Status:  DNR Goals of care: Advanced Directive information Advanced Directives 08/31/2015  Does patient have an advance directive? Yes  Type of Advance Directive Out of facility DNR (pink MOST or yellow form)  Does patient want to make changes to advanced directive? No - Patient declined  Copy of advanced directive(s) in chart? Yes     Chief Complaint  Patient presents with  . Medical Management of Chronic Issues    HPI:  Pt is a 80 y.o. female seen today at [facility]   for medical management of chronic diseases.  She is seen in her room today watching TV. She denies any acute issues. She continues to require total care assistance except for meals able to feed self. Her recent Na+ 151 post IVF administration. Facility staff reports no new concerns.    Past Medical History  Diagnosis Date  . Dementia 07/04/2012  . Anemia 07/04/2012  . Unspecified constipation 07/04/2012  . Unspecified vitamin D deficiency 07/04/2012  . Diabetes mellitus type 2, diet-controlled (HCC) 07/04/2012  . CVA (cerebral vascular accident) (HCC) 07/04/2012  . Unspecified hypothyroidism 07/04/2012  . Cardiac arrhythmia 07/04/2012   History reviewed. No pertinent past surgical history.  No Known Allergies    Medication List       This list is accurate as of: 08/31/15  1:52 PM.  Always use your most recent med list.               acetaminophen 325 MG tablet  Commonly known as:  TYLENOL  Take two tablets by mouth twice daily  for foot pain. Do not exceed 4gms of Tylenol in 24 hours     atorvastatin 10 MG tablet  Commonly known as:  LIPITOR  Take 10 mg by mouth daily. For hyperlipidemia     feeding supplement (PRO-STAT SUGAR FREE 64) Liqd  Take 45 mLs by mouth 2 (two) times daily.     ferrous sulfate 325 (65 FE) MG tablet  Take one tablet by mouth twice daily for anemia     lisinopril 10 MG tablet  Commonly known as:  PRINIVIL,ZESTRIL  Take 10 mg by mouth daily. For HTN     metoprolol tartrate 25 MG tablet  Commonly known as:  LOPRESSOR  Take 25 mg by mouth 2 (two) times daily. For HTN     polyethylene glycol powder powder  Commonly known as:  GLYCOLAX/MIRALAX  Take 17 g by mouth daily. Mix in 4-8 ounces of Orange Juice and take by mouth daily for constipation.     Rivastigmine 13.3 MG/24HR Pt24  Commonly known as:  EXELON  Place 1 patch (13.3 mg total) onto the skin daily.     therapeutic multivitamin-minerals tablet  Take 1 tablet by mouth daily.     Vitamin D3 50000 units Caps  Take 1 capsule by mouth once a week.        Review of Systems  Constitutional: Negative for fever, chills, activity change, appetite change and fatigue.  HENT: Negative for congestion, rhinorrhea,  sinus pressure, sneezing and sore throat.   Eyes: Negative.   Respiratory: Negative for cough, chest tightness, shortness of breath and wheezing.   Cardiovascular: Negative for chest pain, palpitations and leg swelling.  Gastrointestinal: Negative for nausea, vomiting, abdominal pain, diarrhea, constipation and abdominal distention.  Endocrine: Negative.   Genitourinary: Negative.   Musculoskeletal: Positive for gait problem. Negative for back pain.  Skin: Negative.   Allergic/Immunologic: Negative.   Neurological: Negative for dizziness, seizures, syncope, light-headedness and headaches.  Hematological: Negative.   Psychiatric/Behavioral: Negative for hallucinations, confusion, sleep disturbance and agitation. The  patient is not nervous/anxious.     Immunization History  Administered Date(s) Administered  . Influenza-Unspecified 11/29/2012, 11/21/2013, 12/12/2014  . PPD Test 10/27/2012, 11/01/2013, 10/31/2014  . Pneumococcal-Unspecified 02/10/2010   Pertinent  Health Maintenance Due  Topic Date Due  . OPHTHALMOLOGY EXAM  02/15/2016 (Originally 04/21/2015)  . INFLUENZA VACCINE  09/15/2015  . HEMOGLOBIN A1C  01/23/2016  . FOOT EXAM  05/24/2016  . PNA vac Low Risk Adult  Addressed   Fall Risk  04/24/2014  Falls in the past year? Exclusion - non ambulatory  Risk for fall due to : Impaired mobility;Impaired balance/gait   Functional Status Survey:    Filed Vitals:   08/31/15 1341  BP: 136/74  Pulse: 69  Temp: 97.5 F (36.4 C)  Resp: 18  Height: 5\' 5"  (1.651 m)  Weight: 274 lb 8 oz (124.512 kg)  SpO2: 94%   Body mass index is 45.68 kg/(m^2). Physical Exam  Constitutional:  Morbid obese Elderly in no acute distress.  HENT:  Head: Normocephalic.  Mouth/Throat: Oropharynx is clear and moist.  Eyes: Conjunctivae and EOM are normal. Pupils are equal, round, and reactive to light. Right eye exhibits no discharge. Left eye exhibits no discharge. No scleral icterus.  Neck: Normal range of motion. No JVD present. No thyromegaly present.  Cardiovascular: Normal rate, regular rhythm, normal heart sounds and intact distal pulses.  Exam reveals no gallop and no friction rub.   No murmur heard. Pulmonary/Chest: Effort normal and breath sounds normal. No respiratory distress. She has no wheezes. She has no rales.  Abdominal: Soft. Bowel sounds are normal. She exhibits no distension. There is no tenderness. There is no rebound and no guarding.  Musculoskeletal: She exhibits no edema.   Bed bound.Moves all extremities X 4 Bilateral feet tender to touch.   Lymphadenopathy:    She has no cervical adenopathy.  Neurological: She is alert.  Skin: Skin is warm and dry. No rash noted. No erythema. No  pallor.  Psychiatric: She has a normal mood and affect.    Labs reviewed:  Recent Labs  07/24/15 08/03/15 08/11/15  NA 150* 150* 154*  K 4.2 4.0 4.0  BUN 25* 40* 40*  CREATININE 0.8 0.9 1.1    Recent Labs  09/19/14 08/11/15  AST 36* 34  ALT 43* 38*  ALKPHOS 105 213*    Recent Labs  09/19/14 01/23/15 07/24/15  WBC 5.1  5.1 6.2 6.2  HGB 11.5*  11.5* 11.7* 11.6*  HCT 37  37 40 39  PLT 213 282 283   Lab Results  Component Value Date   TSH 0.01* 07/24/2015   Lab Results  Component Value Date   HGBA1C 5.3 07/24/2015   Lab Results  Component Value Date   CHOL 154 07/24/2015   HDL 37 07/24/2015   LDLCALC 104 07/24/2015   TRIG 65 07/24/2015    Significant Diagnostic Results in last 30 days:  No results  found.  Assessment/Plan HTN B/p stable. Continue on Lisinopril 10 mg Tablet and metoprolol 25 mg Tablet. BMP 09/11/2015.  Hyperlipidemia Continue on atorvastatin 10 mg Tablet    Constipation  Current regimen effective LBM 08/30/2015.  Anemia  Continue on Ferrous sulfate. Monitor CBC 09/11/2015  Depression  No mood changes reported. Continue to monitor.   Vit D deficiency Last vit D level 35.26 ( 07/27/2015)  Reduce Vit D to 1000 units daily. Recheck Vit D level 09/11/2015   Dementia  No new behavioral issues. Continue on Exelon 13.3 mg patch. Continue to assist with ADL's. Aspiration precaution. Fall and safety precautions.   Hypernatremia  Last Na+ level 151 ( 08/17/2015) post IVF. Recheck BMP 09/01/2015.    Family/ staff Communication: Reviewed plan of care with facility Nurse supervisor   Labs/tests ordered: BMP, CBC, Vit D 09/11/2015.

## 2015-09-11 LAB — BASIC METABOLIC PANEL
BUN: 35 mg/dL — AB (ref 4–21)
Creatinine: 1.2 mg/dL — AB (ref 0.5–1.1)
Glucose: 89 mg/dL
Potassium: 4.1 mmol/L (ref 3.4–5.3)
Sodium: 148 mmol/L — AB (ref 137–147)

## 2015-09-11 LAB — CBC AND DIFFERENTIAL
HEMATOCRIT: 36 % (ref 36–46)
HEMOGLOBIN: 10.7 g/dL — AB (ref 12.0–16.0)
PLATELETS: 263 10*3/uL (ref 150–399)
WBC: 7.3 10^3/mL

## 2015-09-29 ENCOUNTER — Non-Acute Institutional Stay (SKILLED_NURSING_FACILITY): Payer: Medicare Other | Admitting: Family

## 2015-09-29 ENCOUNTER — Encounter: Payer: Self-pay | Admitting: Family

## 2015-09-29 DIAGNOSIS — K5901 Slow transit constipation: Secondary | ICD-10-CM | POA: Diagnosis not present

## 2015-09-29 DIAGNOSIS — F329 Major depressive disorder, single episode, unspecified: Secondary | ICD-10-CM

## 2015-09-29 DIAGNOSIS — E785 Hyperlipidemia, unspecified: Secondary | ICD-10-CM

## 2015-09-29 DIAGNOSIS — F32A Depression, unspecified: Secondary | ICD-10-CM

## 2015-09-29 DIAGNOSIS — I10 Essential (primary) hypertension: Secondary | ICD-10-CM

## 2015-09-29 DIAGNOSIS — E119 Type 2 diabetes mellitus without complications: Secondary | ICD-10-CM | POA: Diagnosis not present

## 2015-09-29 NOTE — Progress Notes (Signed)
Patient ID: Sylvia Medina, female   DOB: [birth year], 80 y.o.   MRN: [id number]  Location:   [facility]  Nursing Home Room Number: 404 B Place of Service:  SNF (31) Provider:  Autumn Pruitt FNP-C   Oneal GroutPANDEY, MAHIMA, MD  Patient Care Team: Oneal GroutMahima Pandey, MD as PCP - General (Internal Medicine) Sharee Holstereborah S Green, NP as Nurse Practitioner (Nurse Practitioner)  Extended Emergency Contact Information Primary Emergency Contact: Cruickshank,LEONA Address: [address]          [place],  [address] Home Phone: [phone] Relation: None  Code Status:  DNR  Goals of care: Advanced Directive information Advanced Directives 09/29/2015  Does patient have an advance directive? Yes  Type of Advance Directive Out of facility DNR (pink MOST or yellow form)  Does patient want to make changes to advanced directive? No - Patient declined  Copy of advanced directive(s) in chart? Yes  Would patient like information on creating an advanced directive? -     Chief Complaint  Patient presents with  . Medical Management of Chronic Issues    Routine Visit    HPI:  Pt is a 80 y.o. female [facility] for medical management of chronic diseases. She has a medical history of Type 2 DM, Dementia, CVA, HTN, Vit D, Hyperlipidemia among other conditions. She is seen in her room today. She denies any acute issues this visit. Facility staff reports no new concerns. Her CBG log ranging from 80's-110's with recent Hgb A1C 5.5.    Past Medical History:  Diagnosis Date  . Anemia 07/04/2012  . Cardiac arrhythmia 07/04/2012  . CVA (cerebral vascular accident) (HCC) 07/04/2012  . Dementia 07/04/2012  . Diabetes mellitus type 2, diet-controlled (HCC) 07/04/2012  . Unspecified constipation 07/04/2012  . Unspecified hypothyroidism 07/04/2012  . Unspecified vitamin D deficiency 07/04/2012   History reviewed. No pertinent surgical history.  No Known Allergies      Medication List       Accurate as of 09/29/15 11:31 AM. Always use your most recent med list.          acetaminophen 325 MG tablet Commonly known as:  TYLENOL Take two tablets by mouth twice daily for foot pain. Do not exceed 4gms of Tylenol in 24 hours   atorvastatin 10 MG tablet Commonly known as:  LIPITOR Take 10 mg by mouth daily. For hyperlipidemia   Cholecalciferol 1000 units tablet Take 1 tablet (1,000 Units total) by mouth daily.   feeding supplement (PRO-STAT SUGAR FREE 64) Liqd Take 45 mLs by mouth 2 (two) times daily.   ferrous sulfate 325 (65 FE) MG tablet Take one tablet by mouth twice daily for anemia   lisinopril 10 MG tablet Commonly known as:  PRINIVIL,ZESTRIL Take 10 mg by mouth daily. For HTN   metoprolol tartrate 25 MG tablet Commonly known as:  LOPRESSOR Take 25 mg by mouth 2 (two) times daily. For HTN   polyethylene glycol powder powder Commonly known as:  GLYCOLAX/MIRALAX Take 17 g by mouth daily. Mix in 4-8 ounces of Orange Juice and take by mouth daily for constipation.   Rivastigmine 13.3 MG/24HR Pt24 Commonly known as:  EXELON Place 1 patch (13.3 mg total) onto the skin daily.   therapeutic multivitamin-minerals tablet Take 1 tablet by mouth daily.       Review of Systems  Constitutional: Negative for activity change, appetite change, chills, fatigue and fever.  HENT: Negative for congestion, rhinorrhea, sinus  pressure, sneezing and sore throat.   Eyes: Negative.   Respiratory: Negative for cough, chest tightness, shortness of breath and wheezing.   Cardiovascular: Negative for chest pain, palpitations and leg swelling.  Gastrointestinal: Negative for abdominal distention, abdominal pain, constipation, diarrhea, nausea and vomiting.  Endocrine: Negative.   Genitourinary: Negative for dysuria, flank pain, frequency and urgency.  Musculoskeletal: Positive for gait problem. Negative for back pain.  Skin: Negative.    Allergic/Immunologic: Negative.   Neurological: Negative for dizziness, seizures, syncope, light-headedness and headaches.  Psychiatric/Behavioral: Negative for agitation, confusion, hallucinations and sleep disturbance. The patient is not nervous/anxious.     Immunization History  Administered Date(s) Administered  . Influenza-Unspecified 11/29/2012, 11/21/2013, 12/12/2014  . PPD Test 10/27/2012, 11/01/2013, 10/31/2014  . Pneumococcal-Unspecified 02/10/2010   Pertinent  Health Maintenance Due  Topic Date Due  . INFLUENZA VACCINE  09/15/2015  . OPHTHALMOLOGY EXAM  02/15/2016 (Originally 04/21/2015)  . HEMOGLOBIN A1C  01/23/2016  . FOOT EXAM  05/24/2016  . PNA vac Low Risk Adult  Addressed   Fall Risk  04/24/2014  Falls in the past year? Exclusion - non ambulatory  Risk for fall due to : Impaired mobility;Impaired balance/gait      Vitals:   09/29/15 1124  BP: 133/61  Pulse: 86  Resp: 20  Temp: 99.3 F (37.4 C)  TempSrc: Oral  SpO2: 94%  Weight: 265 lb 6.4 oz (120.4 kg)  Height: 5\' 5"  (1.651 m)   Body mass index is 44.16 kg/m. Physical Exam  Constitutional:  Morbid obese Elderly in no acute distress.  HENT:  Head: Normocephalic.  Mouth/Throat: Oropharynx is clear and moist.  Eyes: Conjunctivae and EOM are normal. Pupils are equal, round, and reactive to light. Right eye exhibits no discharge. Left eye exhibits no discharge. No scleral icterus.  Neck: Normal range of motion. No JVD present. No thyromegaly present.  Cardiovascular: Normal rate, regular rhythm, normal heart sounds and intact distal pulses.  Exam reveals no gallop and no friction rub.   No murmur heard. Pulmonary/Chest: Effort normal and breath sounds normal. No respiratory distress. She has no wheezes. She has no rales.  Abdominal: Soft. Bowel sounds are normal. She exhibits no distension. There is no tenderness. There is no rebound and no guarding.  Musculoskeletal: She exhibits no edema.   Bed  bound.Moves all extremities X 4 Bilateral feet tender to touch.   Lymphadenopathy:    She has no cervical adenopathy.  Neurological: She is alert.  Skin: Skin is warm and dry. No rash noted. No erythema. No pallor.  Bilateral foot dry skin noted.   Psychiatric: She has a normal mood and affect.    Labs reviewed:  Recent Labs  08/11/15 08/17/15 09/11/15  NA 154* 151* 148*  K 4.0 4.0 4.1  BUN 40* 35* 35*  CREATININE 1.1 1.0 1.2*    Recent Labs  08/11/15  AST 34  ALT 38*  ALKPHOS 213*    Recent Labs  01/23/15 07/24/15 09/11/15  WBC 6.2 6.2 7.3  HGB 11.7* 11.6* 10.7*  HCT 40 39 36  PLT 282 283 263   Lab Results  Component Value Date   TSH 0.01 (A) 07/24/2015   Lab Results  Component Value Date   HGBA1C 5.3 07/24/2015   Lab Results  Component Value Date   CHOL 154 07/24/2015   HDL 37 07/24/2015   LDLCALC 104 07/24/2015   TRIG 65 07/24/2015   Assessment/Plan HTN B/p Stable. Continue lisinopril and Metoprolol. Monitor BMP  Hyperlipidemia  Continue on Lipitor. Monitor Lipid panel.   Constipation  Current regimen effective. Continue to encourage hydration.   Type 2 DM CBG's ranging in 80's-110's. will discontinue CBG's. Monitor Hgb A1C.  Depression  Continues spend most time in bed. Continue to monitor. Obtain TSH level in 4 weeks.     Family/ staff Communication: Reviewed plan of care with patient and facility Nurse supervisor.  Labs/tests ordered:  None

## 2015-10-27 LAB — TSH
TSH: 0.02 u[IU]/mL — AB (ref 0.41–5.90)
TSH: 0.02 u[IU]/mL — AB (ref <=5.90)

## 2015-10-28 ENCOUNTER — Non-Acute Institutional Stay (SKILLED_NURSING_FACILITY): Payer: Medicare Other | Admitting: Family

## 2015-10-28 DIAGNOSIS — R7989 Other specified abnormal findings of blood chemistry: Secondary | ICD-10-CM | POA: Diagnosis not present

## 2015-10-28 DIAGNOSIS — D638 Anemia in other chronic diseases classified elsewhere: Secondary | ICD-10-CM | POA: Diagnosis not present

## 2015-10-28 DIAGNOSIS — E785 Hyperlipidemia, unspecified: Secondary | ICD-10-CM | POA: Diagnosis not present

## 2015-10-28 DIAGNOSIS — F0390 Unspecified dementia without behavioral disturbance: Secondary | ICD-10-CM

## 2015-10-28 DIAGNOSIS — I10 Essential (primary) hypertension: Secondary | ICD-10-CM

## 2015-10-28 NOTE — Progress Notes (Signed)
Location:   [facility]  Nursing Home Room Number: 404  Place of Service:  SNF [phone]) Provider: Dinah Ngetich FNP-C   Oneal Grout, MD  Patient Care Team: Oneal Grout, MD as PCP - General (Internal Medicine) Sharee Holster, NP as Nurse Practitioner (Nurse Practitioner)  Extended Emergency Contact Information Primary Emergency Contact: Whilden,LEONA Address: [address]          [name] Home Phone: [phone] Relation: None  Code Status:  DNR  Goals of care: Advanced Directive information Advanced Directives 09/29/2015  Does patient have an advance directive? Yes  Type of Advance Directive Out of facility DNR (pink MOST or yellow form)  Does patient want to make changes to advanced directive? No - Patient declined  Copy of advanced directive(s) in chart? Yes  Would patient like information on creating an advanced directive? -     Chief Complaint  Patient presents with  . Medical Management of Chronic Issues    HPI:  Pt is a 80 y.o. female seen today at [facility] for medical management of chronic diseases. She has a medical history of HTN,DM 2, Dementia, CVA among other conditions.She is seen in her room today. She denies any acute issues this visit. She continues to require total care assistance. No skin breakdown and no recent fall episodes or hospital admission.Facility staff reports no new concerns. Her recent TSH level results was 0.02 1( 10/27/2015) currently hypothyroidism medication.    Past Medical History:  Diagnosis Date  . Anemia 07/04/2012  . Cardiac arrhythmia 07/04/2012  . CVA (cerebral vascular accident) (HCC) 07/04/2012  . Dementia 07/04/2012  . Diabetes mellitus type 2, diet-controlled (HCC) 07/04/2012  . Unspecified constipation 07/04/2012  . Unspecified hypothyroidism 07/04/2012  . Unspecified vitamin D deficiency 07/04/2012   No past surgical history on file.  No Known Allergies    Medication  List       Accurate as of 10/28/15  5:54 PM. Always use your most recent med list.          acetaminophen 325 MG tablet Commonly known as:  TYLENOL Take two tablets by mouth twice daily for foot pain. Do not exceed 4gms of Tylenol in 24 hours   atorvastatin 10 MG tablet Commonly known as:  LIPITOR Take 10 mg by mouth daily. For hyperlipidemia   Cholecalciferol 1000 units tablet Take 1 tablet (1,000 Units total) by mouth daily.   feeding supplement (PRO-STAT SUGAR FREE 64) Liqd Take 45 mLs by mouth 2 (two) times daily.   ferrous sulfate 325 (65 FE) MG tablet Take one tablet by mouth twice daily for anemia   lisinopril 10 MG tablet Commonly known as:  PRINIVIL,ZESTRIL Take 10 mg by mouth daily. For HTN   metoprolol tartrate 25 MG tablet Commonly known as:  LOPRESSOR Take 25 mg by mouth 2 (two) times daily. For HTN   polyethylene glycol powder powder Commonly known as:  GLYCOLAX/MIRALAX Take 17 g by mouth daily. Mix in 4-8 ounces of Orange Juice and take by mouth daily for constipation.   Rivastigmine 13.3 MG/24HR Pt24 Commonly known as:  EXELON Place 1 patch (13.3 mg total) onto the skin daily.   therapeutic multivitamin-minerals tablet Take 1 tablet by mouth daily.       Review of Systems  Constitutional: Negative for activity change, appetite change, chills, fatigue and fever.  HENT: Negative for congestion, rhinorrhea, sinus pressure, sneezing and sore throat.   Eyes: Negative.  Respiratory: Negative for cough, chest tightness, shortness of breath and wheezing.   Cardiovascular: Negative for chest pain, palpitations and leg swelling.  Gastrointestinal: Negative for abdominal distention, abdominal pain, constipation, diarrhea, nausea and vomiting.  Endocrine: Negative for cold intolerance, heat intolerance, polydipsia and polyphagia.  Genitourinary: Negative for dysuria, flank pain, frequency and urgency.  Musculoskeletal: Positive for gait problem. Negative  for back pain.  Skin: Negative for color change, pallor, rash and wound.  Neurological: Negative for dizziness, seizures, syncope, light-headedness and headaches.  Hematological: Does not bruise/bleed easily.  Psychiatric/Behavioral: Negative for agitation, confusion, hallucinations and sleep disturbance. The patient is not nervous/anxious.     Immunization History  Administered Date(s) Administered  . Influenza-Unspecified 11/29/2012, 11/21/2013, 12/12/2014  . PPD Test 10/27/2012, 11/01/2013, 10/31/2014  . Pneumococcal-Unspecified 02/10/2010   Pertinent  Health Maintenance Due  Topic Date Due  . INFLUENZA VACCINE  09/15/2015  . OPHTHALMOLOGY EXAM  02/15/2016 (Originally 04/21/2015)  . HEMOGLOBIN A1C  01/23/2016  . FOOT EXAM  05/24/2016  . PNA vac Low Risk Adult  Addressed   Fall Risk  04/24/2014  Falls in the past year? Exclusion - non ambulatory  Risk for fall due to : Impaired mobility;Impaired balance/gait      Vitals:   10/28/15 1015  BP: (!) 141/61  Pulse: 76  Resp: 18  Temp: 98 F (36.7 C)  SpO2: 96%  Weight: 257 lb (116.6 kg)  Height: 5\' 5"  (1.651 m)   Body mass index is 42.77 kg/m. Physical Exam  Constitutional: No distress.  Morbid obese Elderly.   HENT:  Head: Normocephalic.  Mouth/Throat: Oropharynx is clear and moist.  Eyes: Conjunctivae and EOM are normal. Pupils are equal, round, and reactive to light. Right eye exhibits no discharge. Left eye exhibits no discharge. No scleral icterus.  Neck: Normal range of motion. No JVD present. No thyromegaly present.  Cardiovascular: Normal rate, regular rhythm, normal heart sounds and intact distal pulses.  Exam reveals no gallop and no friction rub.   No murmur heard. Pulmonary/Chest: Effort normal and breath sounds normal. No respiratory distress. She has no wheezes. She has no rales.  Abdominal: Soft. Bowel sounds are normal. She exhibits no distension. There is no tenderness. There is no rebound and no  guarding.  Genitourinary:  Genitourinary Comments: Incontinent   Musculoskeletal: She exhibits no edema.   Bed bound.Moves all extremities X 4  Lymphadenopathy:    She has no cervical adenopathy.  Neurological: She is alert.  Skin: Skin is warm and dry. No rash noted. No erythema. No pallor.  Bilateral foot dry skin noted.   Psychiatric: She has a normal mood and affect.    Labs reviewed:  Recent Labs  08/11/15 08/17/15 09/11/15  NA 154* 151* 148*  K 4.0 4.0 4.1  BUN 40* 35* 35*  CREATININE 1.1 1.0 1.2*    Recent Labs  08/11/15  AST 34  ALT 38*  ALKPHOS 213*    Recent Labs  01/23/15 07/24/15 09/11/15  WBC 6.2 6.2 7.3  HGB 11.7* 11.6* 10.7*  HCT 40 39 36  PLT 282 283 263   Lab Results  Component Value Date   TSH 0.01 (A) 07/24/2015   Lab Results  Component Value Date   HGBA1C 5.3 07/24/2015   Lab Results  Component Value Date   CHOL 154 07/24/2015   HDL 37 07/24/2015   LDLCALC 104 07/24/2015   TRIG 65 07/24/2015    Assessment/Plan 1. Essential hypertension, benign B/p stable. Continue on Lisinopril and Metoprolol.  Monitor BMP   2. Hyperlipidemia Continue on Atorvastatin. Lipid panel every 3 to 6 months.   3. Anemia of chronic disease Continue on ferrous sulfate. Monitor CBC.   4. Dementia without behavioral disturbance No new behavioral issues reported. Continue on Exelon patch. Continue to assist with ADL's.  5. Abnormal thyroid stimulating hormone (TSH) level Recent TSH level 0.021 ( 10/27/2015). Currently off Hypothyroidism medication. Recheck T3 and T4 Level 10/29/2015.     Family/ staff Communication: Reviewed plan of care with patient and facility Nurse supervisor.   Labs/tests ordered: T3 and T4 Level 10/29/2015

## 2015-11-23 ENCOUNTER — Non-Acute Institutional Stay (SKILLED_NURSING_FACILITY): Payer: Medicare Other | Admitting: Family

## 2015-11-23 DIAGNOSIS — D638 Anemia in other chronic diseases classified elsewhere: Secondary | ICD-10-CM | POA: Diagnosis not present

## 2015-11-23 DIAGNOSIS — K5901 Slow transit constipation: Secondary | ICD-10-CM

## 2015-11-23 DIAGNOSIS — E559 Vitamin D deficiency, unspecified: Secondary | ICD-10-CM | POA: Diagnosis not present

## 2015-11-23 DIAGNOSIS — E782 Mixed hyperlipidemia: Secondary | ICD-10-CM

## 2015-11-23 DIAGNOSIS — E440 Moderate protein-calorie malnutrition: Secondary | ICD-10-CM | POA: Diagnosis not present

## 2015-11-23 NOTE — Progress Notes (Signed)
Location:  [facility] Nursing [facility] Room Number: 404 B  Place of Service:  SNF [phone]) Provider:  Mabelle Mungin FNP-C   Oneal Grout, MD  Patient Care Team: Oneal Grout, MD as PCP - General (Internal Medicine) Sharee Holster, NP as Nurse Practitioner (Nurse Practitioner)  Extended Emergency Contact Information Primary Emergency Contact: Say,LEONA Address: [address]          [name] Home Phone: [phone] Relation: None  Code Status: DNR  Goals of care: Advanced Directive information Advanced Directives 09/29/2015  Does patient have an advance directive? Yes  Type of Advance Directive Out of facility DNR (pink MOST or yellow form)  Does patient want to make changes to advanced directive? No - Patient declined  Copy of advanced directive(s) in chart? Yes  Would patient like information on creating an advanced directive? -     Chief Complaint  Patient presents with  . Medical Management of Chronic Issues    HPI:  Pt is a 80 y.o. female seen today at [facility] for medical management of chronic diseases.She has a medical history of CVA, Constipation. HTN, Dementia, Depression among other conditions. She is  Seen in her room today in bed watching TV. She denies any acute issues this visit.No recent fall episodes or hospital admission. No skin break down.    Past Medical History:  Diagnosis Date  . Anemia 07/04/2012  . Cardiac arrhythmia 07/04/2012  . CVA (cerebral vascular accident) (HCC) 07/04/2012  . Dementia 07/04/2012  . Diabetes mellitus type 2, diet-controlled (HCC) 07/04/2012  . Unspecified constipation 07/04/2012  . Unspecified hypothyroidism 07/04/2012  . Unspecified vitamin D deficiency 07/04/2012   No past surgical history on file.  No Known Allergies    Medication List       Accurate as of 11/23/15  6:52 PM. Always use your most recent med list.          acetaminophen 325 MG tablet Commonly  known as:  TYLENOL Take two tablets by mouth twice daily for foot pain. Do not exceed 4gms of Tylenol in 24 hours   atorvastatin 10 MG tablet Commonly known as:  LIPITOR Take 10 mg by mouth daily. For hyperlipidemia   Cholecalciferol 1000 units tablet Take 1 tablet (1,000 Units total) by mouth daily.   feeding supplement (PRO-STAT SUGAR FREE 64) Liqd Take 45 mLs by mouth 2 (two) times daily.   ferrous sulfate 325 (65 FE) MG tablet Take one tablet by mouth twice daily for anemia   lisinopril 10 MG tablet Commonly known as:  PRINIVIL,ZESTRIL Take 10 mg by mouth daily. For HTN   metoprolol tartrate 25 MG tablet Commonly known as:  LOPRESSOR Take 25 mg by mouth 2 (two) times daily. For HTN   polyethylene glycol powder powder Commonly known as:  GLYCOLAX/MIRALAX Take 17 g by mouth daily. Mix in 4-8 ounces of Orange Juice and take by mouth daily for constipation.   Rivastigmine 13.3 MG/24HR Pt24 Commonly known as:  EXELON Place 1 patch (13.3 mg total) onto the skin daily.   therapeutic multivitamin-minerals tablet Take 1 tablet by mouth daily.       Review of Systems  Constitutional: Negative for activity change, appetite change, chills, fatigue and fever.  HENT: Negative for congestion, rhinorrhea, sinus pressure, sneezing and sore throat.   Eyes: Negative.   Respiratory: Negative for cough, chest tightness, shortness of breath and wheezing.   Cardiovascular: Negative for chest pain, palpitations and  leg swelling.  Gastrointestinal: Negative for abdominal distention, abdominal pain, constipation, diarrhea, nausea and vomiting.  Endocrine: Negative for cold intolerance, heat intolerance, polydipsia and polyphagia.  Genitourinary: Negative for dysuria, flank pain, frequency and urgency.  Musculoskeletal: Positive for gait problem. Negative for back pain.       Bed bound.   Skin: Negative for color change, pallor, rash and wound.  Neurological: Negative for dizziness,  seizures, syncope, light-headedness and headaches.  Hematological: Does not bruise/bleed easily.  Psychiatric/Behavioral: Negative for agitation, confusion, hallucinations and sleep disturbance. The patient is not nervous/anxious.     Immunization History  Administered Date(s) Administered  . Influenza-Unspecified 11/29/2012, 11/21/2013, 12/12/2014  . PPD Test 10/27/2012, 11/01/2013, 10/31/2014  . Pneumococcal-Unspecified 02/10/2010   Pertinent  Health Maintenance Due  Topic Date Due  . INFLUENZA VACCINE  09/15/2015  . OPHTHALMOLOGY EXAM  02/15/2016 (Originally 04/21/2015)  . HEMOGLOBIN A1C  01/23/2016  . FOOT EXAM  05/24/2016  . PNA vac Low Risk Adult  Addressed   Fall Risk  04/24/2014  Falls in the past year? Exclusion - non ambulatory  Risk for fall due to : Impaired mobility;Impaired balance/gait      Vitals:   11/23/15 1045  Resp: 18  SpO2: 98%  Weight: 258 lb 3.2 oz (117.1 kg)  Height: 5\' 5"  (1.651 m)   Body mass index is 42.97 kg/m. Physical Exam  Constitutional: No distress.  Morbid obese Elderly.   HENT:  Head: Normocephalic.  Mouth/Throat: Oropharynx is clear and moist.  Eyes: Conjunctivae and EOM are normal. Pupils are equal, round, and reactive to light. Right eye exhibits no discharge. Left eye exhibits no discharge. No scleral icterus.  Neck: Normal range of motion. No JVD present. No thyromegaly present.  Cardiovascular: Normal rate, regular rhythm, normal heart sounds and intact distal pulses.  Exam reveals no gallop and no friction rub.   No murmur heard. Pulmonary/Chest: Effort normal and breath sounds normal. No respiratory distress. She has no wheezes. She has no rales.  Abdominal: Soft. Bowel sounds are normal. She exhibits no distension. There is no tenderness. There is no rebound and no guarding.  Genitourinary:  Genitourinary Comments: Incontinent   Musculoskeletal: She exhibits no edema.   Moves x 4 extremities. Bed bound.bilateral lower  extremities weakness. Foot drop.   Lymphadenopathy:    She has no cervical adenopathy.  Neurological: She is alert.  Skin: Skin is warm and dry. No rash noted. No erythema. No pallor.  Bilateral foot dry flaky skin. Heel protectors in place.   Psychiatric: She has a normal mood and affect.    Labs reviewed:  Recent Labs  08/11/15 08/17/15 09/11/15  NA 154* 151* 148*  K 4.0 4.0 4.1  BUN 40* 35* 35*  CREATININE 1.1 1.0 1.2*    Recent Labs  08/11/15  AST 34  ALT 38*  ALKPHOS 213*    Recent Labs  01/23/15 07/24/15 09/11/15  WBC 6.2 6.2 7.3  HGB 11.7* 11.6* 10.7*  HCT 40 39 36  PLT 282 283 263   Lab Results  Component Value Date   TSH 0.02 (A) 10/27/2015   Lab Results  Component Value Date   HGBA1C 5.3 07/24/2015   Lab Results  Component Value Date   CHOL 154 07/24/2015   HDL 37 07/24/2015   LDLCALC 104 07/24/2015   TRIG 65 07/24/2015   Assessment/Plan 1. Mixed hyperlipidemia Continue atorvastatin. Monitor Lipid panel.   2. Slow transit constipation Current regimen effective. Continue to encourage hydration and oral intake.  3. Anemia of chronic disease Continue ferrous sulfate. Monitor CBC  4. Vitamin D deficiency Continue on vit D supplements.   5. Moderate protein-calorie malnutrition (HCC) Continue on protein supplements. Monitor BMP.     Family/ staff Communication: Reviewed plan with patient and facility Nurse supervisor.   Labs/tests ordered: None

## 2015-12-24 ENCOUNTER — Non-Acute Institutional Stay (SKILLED_NURSING_FACILITY): Payer: Medicare Other | Admitting: Internal Medicine

## 2015-12-24 ENCOUNTER — Encounter: Payer: Self-pay | Admitting: Internal Medicine

## 2015-12-24 DIAGNOSIS — R946 Abnormal results of thyroid function studies: Secondary | ICD-10-CM | POA: Diagnosis not present

## 2015-12-24 DIAGNOSIS — E559 Vitamin D deficiency, unspecified: Secondary | ICD-10-CM | POA: Diagnosis not present

## 2015-12-24 DIAGNOSIS — E782 Mixed hyperlipidemia: Secondary | ICD-10-CM

## 2015-12-24 DIAGNOSIS — D638 Anemia in other chronic diseases classified elsewhere: Secondary | ICD-10-CM | POA: Diagnosis not present

## 2015-12-24 DIAGNOSIS — E119 Type 2 diabetes mellitus without complications: Secondary | ICD-10-CM

## 2015-12-24 DIAGNOSIS — I10 Essential (primary) hypertension: Secondary | ICD-10-CM | POA: Diagnosis not present

## 2015-12-24 LAB — TSH: TSH: 0.32 u[IU]/mL — AB (ref <=5.90)

## 2015-12-24 NOTE — Progress Notes (Signed)
Patient ID: Sylvia Medina, female   DOB: 1935-08-21, 80 y.o.   MRN: [id number]     Facility: [facility]   Chief Complaint  Patient presents with  . Medical Management of Chronic Issues    Routine Visit   No Known Allergies   Code status: DNR  Advanced Directives 09/29/2015  Does patient have an advance directive? Yes  Type of Advance Directive Out of facility DNR (pink MOST or yellow form)  Does patient want to make changes to advanced directive? No - Patient declined  Copy of advanced directive(s) in chart? Yes  Would patient like information on creating an advanced directive? -    HPI 80 y/o female is seen today for routine visit. She denies any concerns. No concerns from nursing saff. She has dementia and is pleasantly confused. . She feeds herself. She requires assistance with her other ADLs.   ROS Constitutional: Negative for fever, chills Respiratory: Negative for cough, shortness of breath and wheezing.   Cardiovascular: Negative for chest pain, palpitations Gastrointestinal: Negative for heartburn, nausea, vomiting, abdominal pain. Had bowel movement yesterday.  Genitourinary: Negative for dysuria  Musculoskeletal: Negative for back pain, fall in the facility. She does not get out of bed.  Skin: Negative for itching and rash.  Neurological: Negative for dizziness, headaches.  Psychiatric/Behavioral: negative for depression    Past Medical History:  Diagnosis Date  . Anemia 07/04/2012  . Cardiac arrhythmia 07/04/2012  . CVA (cerebral vascular accident) (HCC) 07/04/2012  . Dementia 07/04/2012  . Diabetes mellitus type 2, diet-controlled (HCC) 07/04/2012  . Unspecified constipation 07/04/2012  . Unspecified hypothyroidism 07/04/2012  . Unspecified vitamin D deficiency 07/04/2012   No past surgical history on file.   Medication List       Accurate as of 12/24/15  1:10 PM. Always use your most recent med list.          acetaminophen  325 MG tablet Commonly known as:  TYLENOL Take two tablets by mouth twice daily for foot pain. Do not exceed 4gms of Tylenol in 24 hours   atorvastatin 10 MG tablet Commonly known as:  LIPITOR Take 10 mg by mouth daily. For hyperlipidemia   Cholecalciferol 1000 units tablet Take 1 tablet (1,000 Units total) by mouth daily.   feeding supplement (PRO-STAT SUGAR FREE 64) Liqd Take 30 mLs by mouth 2 (two) times daily.   ferrous sulfate 325 (65 FE) MG tablet Take one tablet by mouth twice daily for anemia   lisinopril 10 MG tablet Commonly known as:  PRINIVIL,ZESTRIL Take 10 mg by mouth daily. For HTN   metoprolol tartrate 25 MG tablet Commonly known as:  LOPRESSOR Take 25 mg by mouth 2 (two) times daily. For HTN   polyethylene glycol powder powder Commonly known as:  GLYCOLAX/MIRALAX Take 17 g by mouth daily. Mix in 4-8 ounces of Orange Juice and take by mouth daily for constipation.   Rivastigmine 13.3 MG/24HR Pt24 Commonly known as:  EXELON Place 1 patch (13.3 mg total) onto the skin daily.   therapeutic multivitamin-minerals tablet Take 1 tablet by mouth daily.   UNABLE TO FIND Med Name: Med pass 60 mL by mouth 2 times daily      Physical exam BP 130/72   Pulse 80   Temp 98.4 F (36.9 C) (Oral)   Resp 20   Ht 5\' 5"  (1.651 m)   Wt 245 lb 12.8 oz (111.5 kg)   SpO2 93%   BMI 40.90 kg/m  Wt Readings from Last 3 Encounters:  12/24/15 245 lb 12.8 oz (111.5 kg)  11/23/15 258 lb 3.2 oz (117.1 kg)  10/28/15 257 lb (116.6 kg)   General- elderly female in no acute distress, obese Neck- no lymphadenopathy Cardiovascular- irregular heart rate, no murmur Respiratory- bilateral clear to auscultation, no wheeze, no rhonchi, no crackles, no use of accessory muscles Abdomen- bowel sounds present, soft, non tender Musculoskeletal- able to move all 4 extremities, mainly in bed and requires assistance with her ADLS, has chronic lymphedema Neurological- she is alert and  oriented to person and place.  Skin- warm and dry Psychiatry- normal mood and affect  Labs  CBC Latest Ref Rng & Units 09/11/2015 07/24/2015 01/23/2015  WBC 10:3/mL 7.3 6.2 6.2  Hemoglobin 12.0 - 16.0 g/dL 10.7(A) 11.6(A) 11.7(A)  Hematocrit 36 - 46 % 36 39 40  Platelets 150 - 399 K/L 263 283 282   CMP Latest Ref Rng & Units 09/11/2015 08/17/2015 08/11/2015  Glucose 70 - 99 mg/dL - - -  BUN 4 - 21 mg/dL 16(X35(A) 09(U35(A) 04(V40(A)  Creatinine 0.5 - 1.1 mg/dL 1.2(A) 1.0 1.1  Sodium 137 - 147 mmol/L 148(A) 151(A) 154(A)  Potassium 3.4 - 5.3 mmol/L 4.1 4.0 4.0  Chloride 96 - 112 mEq/L - - -  CO2 19 - 32 mEq/L - - -  Calcium 8.4 - 10.5 mg/dL - - -  Alkaline Phos 25 - 125 U/L - - 213(A)  AST 13 - 35 U/L - - 34  ALT 7 - 35 U/L - - 38(A)   Lab Results  Component Value Date   TSH 0.02 (A) 10/27/2015   TSH 0.02 (A) 10/27/2015   Lipid Panel     Component Value Date/Time   CHOL 154 07/24/2015   TRIG 65 07/24/2015   HDL 37 07/24/2015   LDLCALC 104 07/24/2015     Assessment/Plan  Essential HTN BP on review shows some elevated reading of SBP of 150, 165 and 171. Currently on lisinopril 10 mg daily and metoprolol 25 mg bid. Increase lisinopril to 20 mg daily and monitor. Check bp daily x 1 week, then 3 days a week for 2 week and then weekly. Review reading to assess for need for further adjustment  Vitamin d def Continue vitamin d 3 1000 u daily  Hyperlipidemia Lipid Panel     Component Value Date/Time   CHOL 154 07/24/2015   TRIG 65 07/24/2015   HDL 37 07/24/2015   LDLCALC 104 07/24/2015   Stable, continue lipitor 10 mg daily. Check lipid panel in december  Anemia of chronic disease Continue ferrous sulfate but change to 325 mg daily given her normal MCV  Dm type 2 Lab Results  Component Value Date   HGBA1C 5.3 07/24/2015   Off medication, diet controlled. Monitor clinically.   Abnormal thyroid level tsh 0.021. Pt denies any palpitation. Check tsh and free t4 next lab to assess  further.    Oneal GroutMAHIMA Amiel Sharrow, MD Internal Medicine [facility] [address] [place] [address] Cell Phone (Monday-Friday 8 am - 5 pm): [phone] On Call: [phone] and follow prompts after 5 pm and on weekends Office Phone: [phone] Office Fax: [phone]

## 2016-01-06 ENCOUNTER — Non-Acute Institutional Stay (SKILLED_NURSING_FACILITY): Payer: Medicare Other | Admitting: Family

## 2016-01-06 DIAGNOSIS — H1031 Unspecified acute conjunctivitis, right eye: Secondary | ICD-10-CM | POA: Diagnosis not present

## 2016-01-06 MED ORDER — ERYTHROMYCIN 5 MG/GM OP OINT: TOPICAL_OINTMENT | Freq: Four times a day (QID) | OPHTHALMIC | Status: AC

## 2016-01-06 NOTE — Progress Notes (Signed)
Location:  [facility] Room Number: 404B  Place of Service:  SNF [phone](31) Provider:  Denaly Gatling FNP-C   Oneal GroutPANDEY, MAHIMA, MD  Patient Care Team: Oneal GroutMahima Pandey, MD as PCP - General (Internal Medicine) Sharee Holstereborah S Green, NP as Nurse Practitioner (Nurse Practitioner)  Extended Emergency Contact Information Primary Emergency Contact: Leece,LEONA Address: [address]          [place]  [address] Home Phone: [phone] Relation: None  Code Status:  DNR  Goals of care: Advanced Directive information Advanced Directives 09/29/2015  Does Patient Have a Medical Advance Directive? Yes  Type of Advance Directive Out of facility DNR (pink MOST or yellow form)  Does patient want to make changes to medical advance directive? No - Patient declined  Copy of Healthcare Power of Attorney in Chart? Yes  Would patient like information on creating a medical advance directive? -     Chief Complaint  Patient presents with  . Acute Visit    red eye     HPI:  Pt is a 80 y.o. female [facility] for an acute visit for evaluation of right eye redness. She is seen in her room today per facility Nurse request. She complains of right eye itching for the past three days. She reports difficulty opening eye in the mornings. She denies any fever, chills, blurry vision, headache or dizziness.    Past Medical History:  Diagnosis Date  . Anemia 07/04/2012  . Cardiac arrhythmia 07/04/2012  . CVA (cerebral vascular accident) (HCC) 07/04/2012  . Dementia 07/04/2012  . Diabetes mellitus type 2, diet-controlled (HCC) 07/04/2012  . Unspecified constipation 07/04/2012  . Unspecified hypothyroidism 07/04/2012  . Unspecified vitamin D deficiency 07/04/2012   No past surgical history on file.  No Known Allergies    Medication List       Accurate as of 01/06/16  2:27 PM. Always use your most recent med list.          acetaminophen 325 MG  tablet Commonly known as:  TYLENOL Take two tablets by mouth twice daily for foot pain. Do not exceed 4gms of Tylenol in 24 hours   atorvastatin 10 MG tablet Commonly known as:  LIPITOR Take 10 mg by mouth daily. For hyperlipidemia   Cholecalciferol 1000 units tablet Take 1 tablet (1,000 Units total) by mouth daily.   feeding supplement (PRO-STAT SUGAR FREE 64) Liqd Take 30 mLs by mouth 2 (two) times daily.   ferrous sulfate 325 (65 FE) MG tablet daily with breakfast. Take one tablet by mouth twice daily for anemia   lisinopril 20 MG tablet Commonly known as:  PRINIVIL,ZESTRIL Take 20 mg by mouth daily. For HTN   metoprolol tartrate 25 MG tablet Commonly known as:  LOPRESSOR Take 25 mg by mouth 2 (two) times daily. For HTN   polyethylene glycol powder powder Commonly known as:  GLYCOLAX/MIRALAX Take 17 g by mouth daily. Mix in 4-8 ounces of Orange Juice and take by mouth daily for constipation.   Rivastigmine 13.3 MG/24HR Pt24 Commonly known as:  EXELON Place 1 patch (13.3 mg total) onto the skin daily.   therapeutic multivitamin-minerals tablet Take 1 tablet by mouth daily.   UNABLE TO FIND Med Name: Med pass 60 mL by mouth 2 times daily       Review of Systems  Constitutional: Negative for activity change, appetite change, chills, fatigue and fever.  HENT: Negative for congestion, rhinorrhea, sinus pressure, sneezing  and sore throat.   Eyes: Positive for discharge, redness and itching. Negative for pain and visual disturbance.  Respiratory: Negative for cough, chest tightness, shortness of breath and wheezing.   Cardiovascular: Negative for chest pain, palpitations and leg swelling.  Gastrointestinal: Negative for abdominal distention, abdominal pain, constipation, diarrhea, nausea and vomiting.  Skin: Negative for color change, pallor, rash and wound.  Neurological: Negative for dizziness, seizures, syncope, light-headedness and headaches.  Hematological: Does  not bruise/bleed easily.  Psychiatric/Behavioral: Negative for agitation, confusion, hallucinations and sleep disturbance. The patient is not nervous/anxious.     Immunization History  Administered Date(s) Administered  . Influenza-Unspecified 11/29/2012, 11/21/2013, 12/12/2014  . PPD Test 10/27/2012, 11/01/2013, 10/31/2014  . Pneumococcal-Unspecified 02/10/2010   Pertinent  Health Maintenance Due  Topic Date Due  . INFLUENZA VACCINE  09/15/2015  . OPHTHALMOLOGY EXAM  02/15/2016 (Originally 04/21/2015)  . HEMOGLOBIN A1C  01/23/2016  . FOOT EXAM  05/24/2016  . PNA vac Low Risk Adult  Addressed   Fall Risk  04/24/2014  Falls in the past year? Exclusion - non ambulatory  Risk for fall due to : Impaired mobility;Impaired balance/gait      Vitals:   01/06/16 1130  BP: (!) 129/47  Pulse: 76  Resp: 18  Temp: 98 F (36.7 C)  SpO2: 96%  Weight: 245 lb 12.8 oz (111.5 kg)  Height: 5\' 5"  (1.651 m)   Body mass index is 40.9 kg/m. Physical Exam  Constitutional: No distress.  Morbid obese Elderly.   HENT:  Head: Normocephalic.  Mouth/Throat: Oropharynx is clear and moist. No oropharyngeal exudate.  Eyes: EOM are normal. Pupils are equal, round, and reactive to light. Right eye exhibits discharge. Left eye exhibits no discharge. No scleral icterus.  Right conjunctiva red with yellow dry drainage on eyelash noted.   Neck: Normal range of motion. No JVD present. No thyromegaly present.  Cardiovascular: Normal rate, regular rhythm, normal heart sounds and intact distal pulses.  Exam reveals no gallop and no friction rub.   No murmur heard. Pulmonary/Chest: Effort normal and breath sounds normal. No respiratory distress. She has no wheezes. She has no rales.  Abdominal: Soft. Bowel sounds are normal. She exhibits no distension. There is no tenderness. There is no rebound and no guarding.  Lymphadenopathy:    She has no cervical adenopathy.  Neurological: She is alert.  Skin: Skin is  warm and dry. No rash noted. No erythema. No pallor.  Psychiatric: She has a normal mood and affect.    Labs reviewed:  Recent Labs  08/11/15 08/17/15 09/11/15  NA 154* 151* 148*  K 4.0 4.0 4.1  BUN 40* 35* 35*  CREATININE 1.1 1.0 1.2*    Recent Labs  08/11/15  AST 34  ALT 38*  ALKPHOS 213*    Recent Labs  01/23/15 07/24/15 09/11/15  WBC 6.2 6.2 7.3  HGB 11.7* 11.6* 10.7*  HCT 40 39 36  PLT 282 283 263   Lab Results  Component Value Date   TSH 0.02 (A) 10/27/2015   TSH 0.02 (A) 10/27/2015   Lab Results  Component Value Date   HGBA1C 5.3 07/24/2015   Lab Results  Component Value Date   CHOL 154 07/24/2015   HDL 37 07/24/2015   LDLCALC 104 07/24/2015   TRIG 65 07/24/2015    Assessment/Plan  Acute bacterial conjunctivitis of right eye Afebrile. Conjunctiva redness with yellow drainage on eyelashes. Start Erythromycin 5% opthalmic ointment Apply 1 cm ribbon to lower conjunctiva sac every 6 hours  X 7 days. Wash right eye with warm wash cloth prior to application.      Family/ staff Communication: Reviewed plan of care with patient and facility Nurse supervisor.   Labs/tests ordered: None

## 2016-01-20 LAB — LIPID PANEL
CHOLESTEROL: 125 mg/dL (ref 0–200)
HDL: 35 mg/dL (ref 35–70)
LDL CALC: 77 mg/dL
TRIGLYCERIDES: 68 mg/dL (ref 40–160)

## 2016-01-21 ENCOUNTER — Non-Acute Institutional Stay (SKILLED_NURSING_FACILITY): Payer: Medicare Other | Admitting: Family

## 2016-01-21 DIAGNOSIS — R634 Abnormal weight loss: Secondary | ICD-10-CM

## 2016-01-21 DIAGNOSIS — E440 Moderate protein-calorie malnutrition: Secondary | ICD-10-CM

## 2016-01-21 DIAGNOSIS — E782 Mixed hyperlipidemia: Secondary | ICD-10-CM | POA: Diagnosis not present

## 2016-01-21 DIAGNOSIS — I10 Essential (primary) hypertension: Secondary | ICD-10-CM | POA: Diagnosis not present

## 2016-01-21 DIAGNOSIS — D638 Anemia in other chronic diseases classified elsewhere: Secondary | ICD-10-CM

## 2016-01-21 NOTE — Progress Notes (Signed)
Location:  [facility] Room Number: 404 B Place of Service:  SNF [phone](31) Provider:  Treonna Klee FNP-C   Oneal GroutPANDEY, MAHIMA, MD  Patient Care Team: Oneal GroutMahima Pandey, MD as PCP - General (Internal Medicine) Sharee Holstereborah S Green, NP as Nurse Practitioner (Nurse Practitioner)  Extended Emergency Contact Information Primary Emergency Contact: Mroczkowski,LEONA Address: [address]          [place],  [address] Home Phone: [phone] Relation: None  Code Status:  DNR  Goals of care: Advanced Directive information Advanced Directives 09/29/2015  Does Patient Have a Medical Advance Directive? Yes  Type of Advance Directive Out of facility DNR (pink MOST or yellow form)  Does patient want to make changes to medical advance directive? No - Patient declined  Copy of Healthcare Power of Attorney in Chart? Yes  Would patient like information on creating a medical advance directive? -     Chief Complaint  Patient presents with  . Medical Management of Chronic Issues    HPI:  Pt is a 80 y.o. female seen today for medical management of chronic diseases. She has a medical history of Type 2 DM,Hypothyroidism, CVA, Constipation. HTN, Dementia, Depression among other conditions. She is  Seen in her room today. She denies any acute issues this visit.She is bed bound. She has had a weight loss of 14.6 pounds over two months though weight loss might be beneficial to her due to obesity. She continues to follow up with Registered dietician. No recent fall episodes or hospital admission. No skin break down. Facility Nurse reports no new concerns. Her recent lipid panel lab result within normal.Will discontinue Lipitor to prevent muscle achness.  Past Medical History:  Diagnosis Date  . Anemia 07/04/2012  . Cardiac arrhythmia 07/04/2012  . CVA (cerebral vascular accident) (HCC) 07/04/2012  . Dementia 07/04/2012  . Diabetes mellitus type 2, diet-controlled (HCC) 07/04/2012  .  Unspecified constipation 07/04/2012  . Unspecified hypothyroidism 07/04/2012  . Unspecified vitamin D deficiency 07/04/2012   No past surgical history on file.  No Known Allergies    Medication List       Accurate as of 01/21/16  6:34 PM. Always use your most recent med list.          acetaminophen 325 MG tablet Commonly known as:  TYLENOL Take two tablets by mouth twice daily for foot pain. Do not exceed 4gms of Tylenol in 24 hours   atorvastatin 10 MG tablet Commonly known as:  LIPITOR Take 10 mg by mouth daily. For hyperlipidemia   Cholecalciferol 1000 units tablet Take 1 tablet (1,000 Units total) by mouth daily.   feeding supplement (PRO-STAT SUGAR FREE 64) Liqd Take 30 mLs by mouth 2 (two) times daily.   ferrous sulfate 325 (65 FE) MG tablet daily with breakfast.   lisinopril 20 MG tablet Commonly known as:  PRINIVIL,ZESTRIL Take 20 mg by mouth daily. For HTN   metoprolol tartrate 25 MG tablet Commonly known as:  LOPRESSOR Take 25 mg by mouth 2 (two) times daily. For HTN   polyethylene glycol powder powder Commonly known as:  GLYCOLAX/MIRALAX Take 17 g by mouth daily. Mix in 4-8 ounces of Orange Juice and take by mouth daily for constipation.   Rivastigmine 13.3 MG/24HR Pt24 Commonly known as:  EXELON Place 1 patch (13.3 mg total) onto the skin daily.   therapeutic multivitamin-minerals tablet Take 1 tablet by mouth daily.   UNABLE TO FIND Med Name: Med pass 60 mL by mouth  2 times daily       Review of Systems  Constitutional: Negative for activity change, appetite change, chills, fatigue and fever.  HENT: Negative for congestion, rhinorrhea, sinus pressure, sneezing and sore throat.   Eyes: Negative.   Respiratory: Negative for cough, chest tightness, shortness of breath and wheezing.   Cardiovascular: Negative for chest pain, palpitations and leg swelling.  Gastrointestinal: Negative for abdominal distention, abdominal pain, constipation, diarrhea,  nausea and vomiting.  Endocrine: Negative for cold intolerance, heat intolerance, polydipsia and polyphagia.  Genitourinary: Negative for dysuria, flank pain, frequency and urgency.  Musculoskeletal: Positive for gait problem. Negative for back pain.  Skin: Negative for color change, pallor, rash and wound.  Neurological: Negative for dizziness, seizures, syncope, light-headedness and headaches.  Hematological: Does not bruise/bleed easily.  Psychiatric/Behavioral: Negative for agitation, confusion, hallucinations and sleep disturbance. The patient is not nervous/anxious.     Immunization History  Administered Date(s) Administered  . Influenza-Unspecified 11/29/2012, 11/21/2013, 12/12/2014  . PPD Test 10/27/2012, 11/01/2013, 10/31/2014  . Pneumococcal-Unspecified 02/10/2010   Pertinent  Health Maintenance Due  Topic Date Due  . INFLUENZA VACCINE  09/15/2015  . OPHTHALMOLOGY EXAM  02/15/2016 (Originally 04/21/2015)  . HEMOGLOBIN A1C  01/23/2016  . FOOT EXAM  05/24/2016  . PNA vac Low Risk Adult  Addressed   Fall Risk  04/24/2014  Falls in the past year? Exclusion - non ambulatory  Risk for fall due to : Impaired mobility;Impaired balance/gait      Vitals:   01/21/16 1130  BP: (!) 141/59  Pulse: 60  Resp: 16  Temp: 97.1 F (36.2 C)  SpO2: 96%  Weight: 243 lb 9.6 oz (110.5 kg)  Height: 5\' 5"  (1.651 m)   Body mass index is 40.54 kg/m. Physical Exam  Constitutional: No distress.  Morbid obese Elderly.   HENT:  Head: Normocephalic.  Mouth/Throat: Oropharynx is clear and moist.  Eyes: Conjunctivae and EOM are normal. Pupils are equal, round, and reactive to light. Right eye exhibits no discharge. Left eye exhibits no discharge. No scleral icterus.  Neck: Normal range of motion. No JVD present. No thyromegaly present.  Cardiovascular: Normal rate, regular rhythm, normal heart sounds and intact distal pulses.  Exam reveals no gallop and no friction rub.   No murmur  heard. Pulmonary/Chest: Effort normal and breath sounds normal. No respiratory distress. She has no wheezes. She has no rales.  Abdominal: Soft. Bowel sounds are normal. She exhibits no distension. There is no tenderness. There is no rebound and no guarding.  Genitourinary:  Genitourinary Comments: Incontinent   Musculoskeletal: She exhibits no edema.   Bed bound.Moves all extremities X 4  Lymphadenopathy:    She has no cervical adenopathy.  Neurological: She is alert.  Skin: Skin is warm and dry. No rash noted. No erythema. No pallor.  Bilateral foot dry skin noted.   Psychiatric: She has a normal mood and affect.    Labs reviewed:  Recent Labs  08/11/15 08/17/15 09/11/15  NA 154* 151* 148*  K 4.0 4.0 4.1  BUN 40* 35* 35*  CREATININE 1.1 1.0 1.2*    Recent Labs  08/11/15  AST 34  ALT 38*  ALKPHOS 213*    Recent Labs  01/23/15 07/24/15 09/11/15  WBC 6.2 6.2 7.3  HGB 11.7* 11.6* 10.7*  HCT 40 39 36  PLT 282 283 263   Lab Results  Component Value Date   TSH 0.32 (A) 12/24/2015   Lab Results  Component Value Date   HGBA1C 5.3  07/24/2015   Lab Results  Component Value Date   CHOL 125 01/20/2016   HDL 35 01/20/2016   LDLCALC 77 01/20/2016   TRIG 68 01/20/2016   Assessment/Plan 1. Loss of weight Has had a weight loss of 14.6 pounds over two months though weight loss might be beneficial to her due to obesity. continues to follow up with Registered dietician.   2. Essential hypertension, benign B/p Stable. Continue on Lisinopril and Metoprolol. Monitor BMP   3. Mixed hyperlipidemia  recent lipid panel lab result within normal.Will discontinue Lipitor to prevent muscle achness.Recheck Lipid panel in 3 months.    4. Moderate protein-calorie malnutrition (HCC) Continue on current supplements. Continue to follow up with Registered dietician.   5. Anemia of chronic disease Continue on Ferrous sulfate. Monitor CBC    Family/ staff Communication: Reviewed  plan of care with patient and facility Nurse supervisor.   Labs/tests ordered: None

## 2016-02-10 LAB — BASIC METABOLIC PANEL
BUN: 42 mg/dL — AB (ref 4–21)
Creatinine: 1.1 mg/dL (ref 0.5–1.1)
Glucose: 68 mg/dL
POTASSIUM: 5 mmol/L (ref 3.4–5.3)
SODIUM: 148 mmol/L — AB (ref 137–147)

## 2016-03-25 ENCOUNTER — Encounter: Payer: Self-pay | Admitting: Family

## 2016-03-25 ENCOUNTER — Non-Acute Institutional Stay (SKILLED_NURSING_FACILITY): Payer: Medicare Other | Admitting: Family

## 2016-03-25 DIAGNOSIS — I10 Essential (primary) hypertension: Secondary | ICD-10-CM

## 2016-03-25 DIAGNOSIS — D638 Anemia in other chronic diseases classified elsewhere: Secondary | ICD-10-CM | POA: Diagnosis not present

## 2016-03-25 DIAGNOSIS — K5909 Other constipation: Secondary | ICD-10-CM

## 2016-03-25 NOTE — Progress Notes (Signed)
Location:  [facility] Room Number: 404-B Place of Service:  SNF [phone]) Provider:  Ksean Vale FNP-C   Oneal Grout, MD  Patient Care Team: Oneal Grout, MD as PCP - General (Internal Medicine) Sharee Holster, NP as Nurse Practitioner (Nurse Practitioner)  Extended Emergency Contact Information Primary Emergency Contact: Martino,LEONA Address: [address]          [name] Home Phone: [phone] Relation: None  Code Status:  DNR  Goals of care: Advanced Directive information Advanced Directives 09/29/2015  Does Patient Have a Medical Advance Directive? Yes  Type of Advance Directive Out of facility DNR (pink MOST or yellow form)  Does patient want to make changes to medical advance directive? No - Patient declined  Copy of Healthcare Power of Attorney in Chart? Yes  Would patient like information on creating a medical advance directive? -     Chief Complaint  Patient presents with  . Medical Management of Chronic Issues    Routine Visit     HPI:  Pt is a 81 y.o. female seen today for medical management of chronic diseases. She has a medical history of Type 2 DM,Hypothyroidism, CVA, Constipation. HTN, Dementia, Depression among other conditions. She is  Seen in her room today. She denies any acute issues this visit.She continues to require total care assistance with her ADL's. She has no skin breakdown. Previous skin dryness on the legs/feet has improved with ointment application.faciliy staff to continue to monitor. Facility Nurse reports no new concerns.  Past Medical History:  Diagnosis Date  . Anemia 07/04/2012  . Cardiac arrhythmia 07/04/2012  . CVA (cerebral vascular accident) (HCC) 07/04/2012  . Dementia 07/04/2012  . Diabetes mellitus type 2, diet-controlled (HCC) 07/04/2012  . Unspecified constipation 07/04/2012  . Unspecified hypothyroidism 07/04/2012  . Unspecified vitamin D deficiency 07/04/2012   No past  surgical history on file.  No Known Allergies  Allergies as of 03/25/2016   No Known Allergies     Medication List       Accurate as of 03/25/16 11:59 PM. Always use your most recent med list.          acetaminophen 325 MG tablet Commonly known as:  TYLENOL Take two tablets by mouth twice daily for foot pain. Do not exceed 4gms of Tylenol in 24 hours   Cholecalciferol 1000 units tablet Take 1 tablet (1,000 Units total) by mouth daily.   feeding supplement (PRO-STAT SUGAR FREE 64) Liqd Take 30 mLs by mouth 2 (two) times daily.   ferrous sulfate 325 (65 FE) MG tablet daily with breakfast.   lisinopril 20 MG tablet Commonly known as:  PRINIVIL,ZESTRIL Take 20 mg by mouth daily. For HTN   metoprolol tartrate 25 MG tablet Commonly known as:  LOPRESSOR Take 25 mg by mouth 2 (two) times daily. For HTN   multivitamin tablet Take 1 tablet by mouth daily.   polyethylene glycol powder powder Commonly known as:  GLYCOLAX/MIRALAX Take 17 g by mouth daily. Mix in 4-8 ounces of Orange Juice and take by mouth daily for constipation.   Rivastigmine 13.3 MG/24HR Pt24 Commonly known as:  EXELON Place 1 patch (13.3 mg total) onto the skin daily.   UNABLE TO FIND Med Name: Med pass 60 mL by mouth 2 times daily       Review of Systems  Constitutional: Negative for activity change, appetite change, chills, fatigue and fever.  HENT: Negative for congestion, rhinorrhea, sinus pressure, sneezing and  sore throat.   Eyes: Negative.   Respiratory: Negative for cough, chest tightness, shortness of breath and wheezing.   Cardiovascular: Negative for chest pain, palpitations and leg swelling.  Gastrointestinal: Negative for abdominal distention, abdominal pain, constipation, diarrhea, nausea and vomiting.  Endocrine: Negative for cold intolerance, heat intolerance, polydipsia and polyphagia.  Genitourinary: Negative for dysuria, flank pain, frequency and urgency.  Musculoskeletal: Positive  for gait problem. Negative for back pain.  Skin: Negative for color change, pallor, rash and wound.  Neurological: Negative for dizziness, seizures, syncope, light-headedness and headaches.  Hematological: Does not bruise/bleed easily.  Psychiatric/Behavioral: Negative for agitation, confusion, hallucinations and sleep disturbance. The patient is not nervous/anxious.     Immunization History  Administered Date(s) Administered  . Influenza-Unspecified 11/29/2012, 11/21/2013, 12/12/2014  . PPD Test 10/27/2012, 11/01/2013, 10/31/2014  . Pneumococcal-Unspecified 02/10/2010   Pertinent  Health Maintenance Due  Topic Date Due  . OPHTHALMOLOGY EXAM  04/21/2015  . INFLUENZA VACCINE  09/15/2015  . HEMOGLOBIN A1C  01/23/2016  . FOOT EXAM  05/24/2016  . PNA vac Low Risk Adult  Addressed   Fall Risk  04/24/2014  Falls in the past year? Exclusion - non ambulatory  Risk for fall due to : Impaired mobility;Impaired balance/gait      Vitals:   03/25/16 1130  BP: (!) 154/50  Pulse: 65  Resp: 20  Temp: 97.9 F (36.6 C)  TempSrc: Oral  SpO2: 98%  Weight: 243 lb (110.2 kg)  Height: 5\' 5"  (1.651 m)   Body mass index is 40.44 kg/m. Physical Exam  Constitutional: No distress.  Morbid obese  HENT:  Head: Normocephalic.  Mouth/Throat: Oropharynx is clear and moist.  Eyes: Conjunctivae and EOM are normal. Pupils are equal, round, and reactive to light. Right eye exhibits no discharge. Left eye exhibits no discharge. No scleral icterus.  Neck: Normal range of motion. No JVD present. No thyromegaly present.  Cardiovascular: Normal rate, regular rhythm, normal heart sounds and intact distal pulses.  Exam reveals no gallop and no friction rub.   No murmur heard. Pulmonary/Chest: Effort normal and breath sounds normal. No respiratory distress. She has no wheezes. She has no rales.  Abdominal: Soft. Bowel sounds are normal. She exhibits no distension. There is no tenderness. There is no rebound  and no guarding.  Genitourinary:  Genitourinary Comments: Incontinent   Musculoskeletal: She exhibits no edema.  Bilateral lower extremities weakness   Lymphadenopathy:    She has no cervical adenopathy.  Neurological: She is alert.  Skin: Skin is warm and dry. No rash noted. No erythema. No pallor.  Lower extremities/ foot skin dryness has improved.   Psychiatric: She has a normal mood and affect.    Labs reviewed:  Recent Labs  08/17/15 09/11/15 02/10/16  NA 151* 148* 148*  K 4.0 4.1 5.0  BUN 35* 35* 42*  CREATININE 1.0 1.2* 1.1    Recent Labs  08/11/15  AST 34  ALT 38*  ALKPHOS 213*    Recent Labs  07/24/15 09/11/15  WBC 6.2 7.3  HGB 11.6* 10.7*  HCT 39 36  PLT 283 263   Lab Results  Component Value Date   TSH 0.32 (A) 12/24/2015   Lab Results  Component Value Date   HGBA1C 5.3 07/24/2015   Lab Results  Component Value Date   CHOL 125 01/20/2016   HDL 35 01/20/2016   LDLCALC 77 01/20/2016   TRIG 68 01/20/2016   Assessment/Plan  1. Essential hypertension, benign B/p Stable. Continue on Lisinopril  and Metoprolol. Monitor BMP  2.  Anemia of chronic disease Continue on Ferrous sulfate. Monitor CBC  3. Constipation  Current regimen effective. Continue to encourage oral and fluid intake.   Family/ staff Communication: Reviewed plan of care with patient and facility Nurse supervisor.   Labs/tests ordered: None

## 2016-04-20 LAB — LIPID PANEL
CHOLESTEROL: 157 mg/dL (ref 0–200)
HDL: 38 mg/dL (ref 35–70)
LDL CALC: 108 mg/dL
TRIGLYCERIDES: 56 mg/dL (ref 40–160)

## 2016-04-21 ENCOUNTER — Non-Acute Institutional Stay (SKILLED_NURSING_FACILITY): Payer: Medicare Other | Admitting: Family

## 2016-04-21 ENCOUNTER — Encounter: Payer: Self-pay | Admitting: Family

## 2016-04-21 DIAGNOSIS — D638 Anemia in other chronic diseases classified elsewhere: Secondary | ICD-10-CM | POA: Diagnosis not present

## 2016-04-21 DIAGNOSIS — E559 Vitamin D deficiency, unspecified: Secondary | ICD-10-CM | POA: Diagnosis not present

## 2016-04-21 DIAGNOSIS — F0390 Unspecified dementia without behavioral disturbance: Secondary | ICD-10-CM

## 2016-04-21 DIAGNOSIS — K5901 Slow transit constipation: Secondary | ICD-10-CM

## 2016-04-21 NOTE — Progress Notes (Signed)
Location:  [facility] Room Number: 404B Place of Service:  SNF [phone]) Provider:  Deneise Getty FNP-C   Oneal Grout, MD  Patient Care Team: Oneal Grout, MD as PCP - General (Internal Medicine) Sharee Holster, NP as Nurse Practitioner (Nurse Practitioner)  Extended Emergency Contact Information Primary Emergency Contact: Fabrizio,LEONA Address: [address]          [name] Home Phone: [phone] Relation: None  Code Status:  DNR  Goals of care: Advanced Directive information Advanced Directives 04/21/2016  Does Patient Have a Medical Advance Directive? Yes  Type of Advance Directive Out of facility DNR (pink MOST or yellow form)  Does patient want to make changes to medical advance directive? No - Patient declined  Copy of Healthcare Power of Attorney in Chart? -  Would patient like information on creating a medical advance directive? -     Chief Complaint  Patient presents with  . Medical Management of Chronic Issues    Routine Visit    HPI:  Pt is a 81 y.o. female seen today for medical management of chronic diseases. She has a medical history of HTN,Type 2 DM diet controlled,Hypothyroidism, CVA, Constipation, Dementia, Depression among other conditions.She is seen in her room today. She denies any acute issues this visit.She has had no recent acute illness or fall episodes. She spends most time in the bed watching TV. No skin breakdown.No weight changes. Facility Nurse reports no new concerns.  Past Medical History:  Diagnosis Date  . Anemia 07/04/2012  . Cardiac arrhythmia 07/04/2012  . CVA (cerebral vascular accident) (HCC) 07/04/2012  . Dementia 07/04/2012  . Diabetes mellitus type 2, diet-controlled (HCC) 07/04/2012  . Unspecified constipation 07/04/2012  . Unspecified hypothyroidism 07/04/2012  . Unspecified vitamin D deficiency 07/04/2012   No past surgical history on file.  No Known Allergies  Allergies as of  04/21/2016   No Known Allergies     Medication List       Accurate as of 04/21/16  1:12 PM. Always use your most recent med list.          acetaminophen 325 MG tablet Commonly known as:  TYLENOL Take 650 mg by mouth 2 (two) times daily. foot pain. Do not exceed 4gms of Tylenol in 24 hours   Cholecalciferol 1000 units tablet Take 1 tablet (1,000 Units total) by mouth daily.   feeding supplement (PRO-STAT SUGAR FREE 64) Liqd Take 30 mLs by mouth 2 (two) times daily.   ferrous sulfate 325 (65 FE) MG tablet Take 325 mg by mouth daily with breakfast.   lisinopril 20 MG tablet Commonly known as:  PRINIVIL,ZESTRIL Take 20 mg by mouth daily. Hold for SBP < 110 For HTN   metoprolol tartrate 25 MG tablet Commonly known as:  LOPRESSOR Take 25 mg by mouth 2 (two) times daily. For HTN   multivitamin tablet Take 1 tablet by mouth daily.   polyethylene glycol powder powder Commonly known as:  GLYCOLAX/MIRALAX Take 17 g by mouth daily. Mix in 4-8 ounces of Orange Juice and take by mouth daily for constipation.   Rivastigmine 13.3 MG/24HR Pt24 Commonly known as:  EXELON Place 1 patch (13.3 mg total) onto the skin daily.   UNABLE TO FIND Med Name: Med pass 2.0 take 60 mL by mouth 2 times daily       Review of Systems  Constitutional: Negative for activity change, appetite change, chills, fatigue and fever.  HENT: Negative for congestion,  rhinorrhea, sinus pressure, sneezing and sore throat.   Eyes: Negative.   Respiratory: Negative for cough, chest tightness, shortness of breath and wheezing.   Cardiovascular: Negative for chest pain, palpitations and leg swelling.  Gastrointestinal: Negative for abdominal distention, abdominal pain, constipation, diarrhea, nausea and vomiting.  Endocrine: Negative for cold intolerance, heat intolerance, polydipsia and polyphagia.  Genitourinary: Negative for dysuria, flank pain, frequency and urgency.  Musculoskeletal: Positive for gait problem.  Negative for back pain.  Skin: Negative for color change, pallor, rash and wound.       Dry skin to legs/feet has Vaseline ointment   Neurological: Negative for dizziness, seizures, syncope, light-headedness and headaches.  Hematological: Does not bruise/bleed easily.  Psychiatric/Behavioral: Negative for agitation, confusion, hallucinations and sleep disturbance. The patient is not nervous/anxious.     Immunization History  Administered Date(s) Administered  . Influenza-Unspecified 11/29/2012, 11/21/2013, 12/12/2014, 03/09/2016  . PPD Test 10/27/2012, 11/01/2013, 10/31/2014  . Pneumococcal-Unspecified 02/10/2010   Pertinent  Health Maintenance Due  Topic Date Due  . OPHTHALMOLOGY EXAM  04/21/2015  . HEMOGLOBIN A1C  01/23/2016  . FOOT EXAM  05/24/2016  . INFLUENZA VACCINE  Completed  . PNA vac Low Risk Adult  Addressed   Fall Risk  04/24/2014  Falls in the past year? Exclusion - non ambulatory  Risk for fall due to : Impaired mobility;Impaired balance/gait      Vitals:   04/21/16 1130  BP: (!) 103/45  Pulse: (!) 58  Resp: 20  Weight: 242 lb 9.6 oz (110 kg)  Height: 5\' 5"  (1.651 m)   Body mass index is 40.37 kg/m. Physical Exam  Constitutional: No distress.  Morbid obese  HENT:  Head: Normocephalic.  Mouth/Throat: Oropharynx is clear and moist.  Eyes: Conjunctivae and EOM are normal. Pupils are equal, round, and reactive to light. Right eye exhibits no discharge. Left eye exhibits no discharge. No scleral icterus.  Neck: Normal range of motion. No JVD present. No thyromegaly present.  Cardiovascular: Normal rate, regular rhythm, normal heart sounds and intact distal pulses.  Exam reveals no gallop and no friction rub.   No murmur heard. Pulmonary/Chest: Effort normal and breath sounds normal. No respiratory distress. She has no wheezes. She has no rales.  Abdominal: Soft. Bowel sounds are normal. She exhibits no distension. There is no tenderness. There is no rebound  and no guarding.  Genitourinary:  Genitourinary Comments: Incontinent   Musculoskeletal: She exhibits no edema.  Bilateral lower extremities weakness   Lymphadenopathy:    She has no cervical adenopathy.  Neurological: She is alert.  Skin: Skin is warm and dry. No rash noted. No erythema. No pallor.   Dry, scaly skin on feet noted.   Psychiatric: She has a normal mood and affect.    Labs reviewed:  Recent Labs  08/17/15 09/11/15 02/10/16  NA 151* 148* 148*  K 4.0 4.1 5.0  BUN 35* 35* 42*  CREATININE 1.0 1.2* 1.1    Recent Labs  08/11/15  AST 34  ALT 38*  ALKPHOS 213*    Recent Labs  07/24/15 09/11/15  WBC 6.2 7.3  HGB 11.6* 10.7*  HCT 39 36  PLT 283 263   Lab Results  Component Value Date   TSH 0.32 (A) 12/24/2015   Lab Results  Component Value Date   HGBA1C 5.3 07/24/2015   Lab Results  Component Value Date   CHOL 125 01/20/2016   HDL 35 01/20/2016   LDLCALC 77 01/20/2016   TRIG 68 01/20/2016  Assessment/Plan  1. Anemia of chronic disease Continue on Ferrous sulfate. Monitor CBC  2.  Constipation  Current regimen effective. Continue to encourage oral and fluid intake.   3. Dementia  No new behavioral issues. Continue to assist with ADL's.continue to assist with ADL's.   4. Vitamin D deficiency  Continue on vit D supplements. Continue to monitor vit D levels.     Family/ staff Communication: Reviewed plan of care with patient and facility Nurse supervisor.   Labs/tests ordered: None

## 2016-05-24 ENCOUNTER — Encounter: Payer: Self-pay | Admitting: Family

## 2016-05-24 ENCOUNTER — Non-Acute Institutional Stay (SKILLED_NURSING_FACILITY): Payer: Medicare Other | Admitting: Family

## 2016-05-24 DIAGNOSIS — F32A Depression, unspecified: Secondary | ICD-10-CM

## 2016-05-24 DIAGNOSIS — I10 Essential (primary) hypertension: Secondary | ICD-10-CM

## 2016-05-24 DIAGNOSIS — F329 Major depressive disorder, single episode, unspecified: Secondary | ICD-10-CM

## 2016-05-24 DIAGNOSIS — F0390 Unspecified dementia without behavioral disturbance: Secondary | ICD-10-CM

## 2016-05-24 DIAGNOSIS — E119 Type 2 diabetes mellitus without complications: Secondary | ICD-10-CM

## 2016-05-24 NOTE — Progress Notes (Signed)
Location:  [facility] Room Number: [phone] Place of Service:  SNF [phone]) Provider: Briya Lookabaugh FNP-C   Oneal Grout, MD  Patient Care Team: Oneal Grout, MD as PCP - General (Internal Medicine) Sharee Holster, NP as Nurse Practitioner (Nurse Practitioner)  Extended Emergency Contact Information Primary Emergency Contact: Braggs,LEONA Address: [address]          [name] Home Phone: [phone] Relation: None  Code Status:  DNR Goals of care: Advanced Directive information Advanced Directives 05/24/2016  Does Patient Have a Medical Advance Directive? Yes  Type of Advance Directive Out of facility DNR (pink MOST or yellow form)  Does patient want to make changes to medical advance directive? No - Patient declined  Copy of Healthcare Power of Attorney in Chart? -  Would patient like information on creating a medical advance directive? -  Pre-existing out of facility DNR order (yellow form or pink MOST form) Yellow form placed in chart (order not valid for inpatient use)     Chief Complaint  Patient presents with  . Medical Management of Chronic Issues    Routine Visit    HPI:  Pt is a 81 y.o. female seen today [facility] for medical management of chronic diseases. She has a medical history of HTN, Hyperlipidemia,Type 2 DM diet controlled, CVA, Morbid obesity, Dementia among other conditions. She is seen in her room today. She dnies any acute issues this visit. She continues to spends most time in the bed. She has no skin breakdown. No recent acute illnesses or fall episodes. Facility Nurse reports no new concerns.     Past Medical History:  Diagnosis Date  . Anemia 07/04/2012  . Cardiac arrhythmia 07/04/2012  . CVA (cerebral vascular accident) (HCC) 07/04/2012  . Dementia 07/04/2012  . Diabetes mellitus type 2, diet-controlled (HCC) 07/04/2012  . Unspecified constipation 07/04/2012  . Unspecified  hypothyroidism 07/04/2012  . Unspecified vitamin D deficiency 07/04/2012   History reviewed. No pertinent surgical history.  No Known Allergies  Allergies as of 05/24/2016   No Known Allergies     Medication List       Accurate as of 05/24/16 10:56 PM. Always use your most recent med list.          acetaminophen 325 MG tablet Commonly known as:  TYLENOL Take 650 mg by mouth 2 (two) times daily. foot pain. Do not exceed 4gms of Tylenol in 24 hours   Cholecalciferol 1000 units tablet Take 1 tablet (1,000 Units total) by mouth daily.   feeding supplement (PRO-STAT SUGAR FREE 64) Liqd Take 30 mLs by mouth 2 (two) times daily.   ferrous sulfate 325 (65 FE) MG tablet Take 325 mg by mouth daily with breakfast.   lisinopril 20 MG tablet Commonly known as:  PRINIVIL,ZESTRIL Take 20 mg by mouth daily. Hold for SBP < 110 For HTN   metoprolol tartrate 25 MG tablet Commonly known as:  LOPRESSOR Take 25 mg by mouth 2 (two) times daily. For HTN   multivitamin tablet Take 1 tablet by mouth daily.   polyethylene glycol powder powder Commonly known as:  GLYCOLAX/MIRALAX Take 17 g by mouth daily. Mix in 4-8 ounces of Orange Juice and take by mouth daily for constipation.   Rivastigmine 13.3 MG/24HR Pt24 Commonly known as:  EXELON Place 1 patch (13.3 mg total) onto the skin daily.   UNABLE TO FIND Med Name: Med pass 2.0 take 60 mL by mouth  2 times daily       Review of Systems  Constitutional: Negative for activity change, appetite change, chills, fatigue and fever.  HENT: Negative for congestion, rhinorrhea, sinus pressure, sneezing and sore throat.   Eyes: Negative.   Respiratory: Negative for cough, chest tightness, shortness of breath and wheezing.   Cardiovascular: Negative for chest pain, palpitations and leg swelling.  Gastrointestinal: Negative for abdominal distention, abdominal pain, constipation, diarrhea, nausea and vomiting.  Endocrine: Negative for cold  intolerance, heat intolerance, polydipsia and polyphagia.  Genitourinary: Negative for dysuria, flank pain, frequency and urgency.  Musculoskeletal: Positive for gait problem. Negative for back pain.  Skin: Negative for color change, pallor, rash and wound.       No skin breakdown  Neurological: Negative for dizziness, seizures, syncope, light-headedness and headaches.  Hematological: Does not bruise/bleed easily.  Psychiatric/Behavioral: Negative for agitation, confusion, hallucinations and sleep disturbance. The patient is not nervous/anxious.     Immunization History  Administered Date(s) Administered  . Influenza-Unspecified 11/29/2012, 11/21/2013, 12/12/2014, 03/09/2016  . PPD Test 10/27/2012, 11/01/2013, 10/31/2014  . Pneumococcal-Unspecified 02/10/2010   Pertinent  Health Maintenance Due  Topic Date Due  . OPHTHALMOLOGY EXAM  04/21/2015  . HEMOGLOBIN A1C  01/23/2016  . FOOT EXAM  05/24/2016  . INFLUENZA VACCINE  09/14/2016  . PNA vac Low Risk Adult  Addressed   Fall Risk  04/24/2014  Falls in the past year? Exclusion - non ambulatory  Risk for fall due to : Impaired mobility;Impaired balance/gait    Vitals:   05/24/16 1212  BP: (!) 137/56  Pulse: 91  Resp: 18  Temp: 98 F (36.7 C)  TempSrc: Oral  SpO2: 92%  Weight: 243 lb 1.6 oz (110.3 kg)  Height:  (1.651 m)   Body mass index is 40.45 kg/m. Physical Exam  Constitutional: No distress.  Morbid obese elderly in no acute distress   HENT:  Head: Normocephalic.  Mouth/Throat: Oropharynx is clear and moist.  Eyes: Conjunctivae and EOM are normal. Pupils are equal, round, and reactive to light. Right eye exhibits no discharge. Left eye exhibits no discharge. No scleral icterus.  Neck: Normal range of motion. No JVD present. No thyromegaly present.  Cardiovascular: Normal rate, regular rhythm, normal heart sounds and intact distal pulses.  Exam reveals no gallop and no friction rub.   No murmur  heard. Pulmonary/Chest: Effort normal and breath sounds normal. No respiratory distress. She has no wheezes. She has no rales.  Abdominal: Soft. Bowel sounds are normal. She exhibits no distension. There is no tenderness. There is no rebound and no guarding.  Genitourinary:  Genitourinary Comments: Incontinent for both bowel and bladder  Musculoskeletal: She exhibits no edema.  Bilateral lower extremities weakness   Lymphadenopathy:    She has no cervical adenopathy.  Neurological: She is alert.  Skin: Skin is warm and dry. No rash noted. No erythema. No pallor.  No skin breakdown   Psychiatric: She has a normal mood and affect.    Labs reviewed:  Recent Labs  08/17/15 09/11/15 02/10/16  NA 151* 148* 148*  K 4.0 4.1 5.0  BUN 35* 35* 42*  CREATININE 1.0 1.2* 1.1    Recent Labs  08/11/15  AST 34  ALT 38*  ALKPHOS 213*    Recent Labs  07/24/15 09/11/15  WBC 6.2 7.3  HGB 11.6* 10.7*  HCT 39 36  PLT 283 263   Lab Results  Component Value Date   TSH 0.32 (A) 12/24/2015   Lab Results  Component Value Date   HGBA1C 5.3 07/24/2015   Lab Results  Component Value Date   CHOL 157 04/20/2016   HDL 38 04/20/2016   LDLCALC 108 04/20/2016   TRIG 56 04/20/2016   Assessment/Plan 1. Diabetes mellitus type 2, diet-controlled  No signs or symptoms of hypo/hyperglycemia.Due for annual Hgb A1C. Refer to opthalmology for annual eye exam. Continue follow up with in house podiatrist for foot exam.on ACE inhibitor. Obtain Hgb A1C 05/25/2016.     2. Essential hypertension B/p stable. Continue on metoprolol and Lisinopril. Obtain CBC, CMP 05/25/2016.   3. Depression Stable. Spends most time in the bed watching TV. Continue to monitor for mood changes. TSH level 05/25/2016.   4. Dementia without behavioral disturbance No new behavioral issues reported. Continue on Rivastigmine patch. Continue to assist with ADL's.    Family/ staff Communication: Reviewed plan of care with patient  and facility Nurse supervisor   Labs/tests ordered: CBC, CMP, TSH level and Hgb A1C 05/25/2016.   Caesar Bookman, NP

## 2016-05-25 LAB — BASIC METABOLIC PANEL
BUN: 42 mg/dL — AB (ref 4–21)
CREATININE: 1.5 mg/dL — AB (ref 0.5–1.1)
Glucose: 84 mg/dL
POTASSIUM: 4.5 mmol/L (ref 3.4–5.3)
SODIUM: 150 mmol/L — AB (ref 137–147)

## 2016-05-25 LAB — CBC AND DIFFERENTIAL
HEMATOCRIT: 35 % — AB (ref 36–46)
HEMOGLOBIN: 10.7 g/dL — AB (ref 12.0–16.0)
PLATELETS: 320 10*3/uL (ref 150–399)
WBC: 10.4 10*3/mL

## 2016-05-25 LAB — HEPATIC FUNCTION PANEL
ALK PHOS: 389 U/L — AB (ref 25–125)
ALT: 92 U/L — AB (ref 7–35)
AST: 81 U/L — AB (ref 13–35)
Bilirubin, Total: 0.3 mg/dL

## 2016-05-25 LAB — HEMOGLOBIN A1C: HEMOGLOBIN A1C: 5.2

## 2016-05-25 LAB — TSH: TSH: 0.02 u[IU]/mL — AB (ref 0.41–5.90)

## 2016-05-27 ENCOUNTER — Encounter: Payer: Self-pay | Admitting: Family

## 2016-05-27 ENCOUNTER — Non-Acute Institutional Stay (SKILLED_NURSING_FACILITY): Payer: Medicare Other | Admitting: Family

## 2016-05-27 DIAGNOSIS — R748 Abnormal levels of other serum enzymes: Secondary | ICD-10-CM | POA: Diagnosis not present

## 2016-05-27 DIAGNOSIS — E0590 Thyrotoxicosis, unspecified without thyrotoxic crisis or storm: Secondary | ICD-10-CM | POA: Diagnosis not present

## 2016-05-27 DIAGNOSIS — E8809 Other disorders of plasma-protein metabolism, not elsewhere classified: Secondary | ICD-10-CM

## 2016-05-27 DIAGNOSIS — E870 Hyperosmolality and hypernatremia: Secondary | ICD-10-CM | POA: Diagnosis not present

## 2016-05-27 NOTE — Progress Notes (Signed)
Location:  [place] Room Number: [place] of Service:  SNF [phone]) Provider: Tyjae Shvartsman FNP-C   Blanchie Serve, MD  Patient Care Team: Blanchie Serve, MD as PCP - General (Internal Medicine) Gerlene Fee, NP as Nurse Practitioner (Nurse Practitioner)  Extended Emergency Contact Information Primary Emergency Contact: Rohrbach,LEONA Address: [address]          [place],  [place] Phone: [phone] Relation: None  Code Status:  DNR Goals of care: Advanced Directive information Advanced Directives 05/27/2016  Does Patient Have a Medical Advance Directive? Yes  Type of Advance Directive Out of facility DNR (pink MOST or yellow form)  Does patient want to make changes to medical advance directive? No - Patient declined  Copy of [place] in Chart? -  Would patient like information on creating a medical advance directive? -  Pre-existing out of facility DNR order (yellow form or pink MOST form) Yellow form placed in chart (order not valid for inpatient use)     Chief Complaint  Patient presents with  . Acute Visit    Abnormal Labs    HPI:  Pt is a 81 y.o. female seen today [facility] for medical management of chronic diseases. She has a medical history of HTN, Hyperlipidemia,Type 2 DM diet controlled, CVA, Morbid obesity, Dementia among other conditions. She is seen in her room today.Her recent TSH level < 0.015, Na 150, BUN 42, CR 1.51,ALB 2.65,AST81, ALT 92 ALK phosp 389 ( 05/25/2016). She dnies any acute issues this visit.    Past Medical History:  Diagnosis Date  . Anemia 07/04/2012  . Cardiac arrhythmia 07/04/2012  . CVA (cerebral vascular accident) ([place]) 07/04/2012  . Dementia 07/04/2012  . Diabetes mellitus type 2, diet-controlled ([place]) 07/04/2012  . Unspecified constipation 07/04/2012  . Unspecified hypothyroidism 07/04/2012  . Unspecified vitamin D deficiency 07/04/2012   History  reviewed. No pertinent surgical history.  No Known Allergies  Allergies as of 05/27/2016   No Known Allergies     Medication List       Accurate as of 05/27/16  2:32 PM. Always use your most recent med list.          acetaminophen 325 MG tablet Commonly known as:  TYLENOL Take 650 mg by mouth 2 (two) times daily. foot pain. Do not exceed 4gms of Tylenol in 24 hours   Cholecalciferol 1000 units tablet Take 1 tablet (1,000 Units total) by mouth daily.   feeding supplement (PRO-STAT SUGAR FREE 64) Liqd Take 30 mLs by mouth 2 (two) times daily.   ferrous sulfate 325 (65 FE) MG tablet Take 325 mg by mouth daily with breakfast.   lisinopril 20 MG tablet Commonly known as:  PRINIVIL,ZESTRIL Take 20 mg by mouth daily. Hold for SBP < 110 For HTN   metoprolol tartrate 25 MG tablet Commonly known as:  LOPRESSOR Take 25 mg by mouth 2 (two) times daily. For HTN   multivitamin tablet Take 1 tablet by mouth daily.   polyethylene glycol powder powder Commonly known as:  GLYCOLAX/MIRALAX Take 17 g by mouth daily. Mix in 4-8 ounces of Orange Juice and take by mouth daily for constipation.   Rivastigmine 13.3 MG/24HR Pt24 Commonly known as:  EXELON Place 1 patch (13.3 mg total) onto the skin daily.   UNABLE TO FIND Med Name: Med pass 2.0 take 60 mL by mouth 2 times daily       Review of  Systems  Constitutional: Negative for activity change, appetite change, chills, fatigue and fever.  HENT: Negative for congestion, rhinorrhea, sinus pressure, sneezing and sore throat.   Eyes: Negative.   Respiratory: Negative for cough, chest tightness, shortness of breath and wheezing.   Cardiovascular: Negative for chest pain, palpitations and leg swelling.  Gastrointestinal: Negative for abdominal distention, abdominal pain, constipation, diarrhea, nausea and vomiting.  Endocrine: Negative for cold intolerance, heat intolerance, polydipsia and polyphagia.  Genitourinary: Negative for  dysuria, flank pain, frequency and urgency.  Musculoskeletal: Positive for gait problem. Negative for back pain.  Skin: Negative for color change, pallor, rash and wound.       Dry skin to lower extremities.  Neurological: Negative for dizziness, seizures, syncope, light-headedness and headaches.  Hematological: Does not bruise/bleed easily.  Psychiatric/Behavioral: Negative for agitation, confusion, hallucinations and sleep disturbance. The patient is not nervous/anxious.     Immunization History  Administered Date(s) Administered  . Influenza-Unspecified 11/29/2012, 11/21/2013, 12/12/2014, 03/09/2016  . PPD Test 10/27/2012, 11/01/2013, 10/31/2014  . Pneumococcal-Unspecified 02/10/2010   Pertinent  Health Maintenance Due  Topic Date Due  . OPHTHALMOLOGY EXAM  04/21/2015  . HEMOGLOBIN A1C  01/23/2016  . FOOT EXAM  05/24/2016  . INFLUENZA VACCINE  09/14/2016  . PNA vac Low Risk Adult  Addressed   Fall Risk  04/24/2014  Falls in the past year? Exclusion - non ambulatory  Risk for fall due to : Impaired mobility;Impaired balance/gait    Vitals:   05/27/16 1430  BP: (!) 151/73  Pulse: 68  Resp: 18  Temp: 97.6 F (36.4 C)  SpO2: 96%  Weight: 243 lb 1.6 oz (110.3 kg)  Height: 5' 5"  (1.651 m)   Body mass index is 40.45 kg/m. Physical Exam  Constitutional: No distress.  Morbid obese elderly in no acute distress   HENT:  Head: Normocephalic.  Mouth/Throat: Oropharynx is clear and moist.  Eyes: Conjunctivae and EOM are normal. Pupils are equal, round, and reactive to light. Right eye exhibits no discharge. Left eye exhibits no discharge. No scleral icterus.  Neck: Normal range of motion. No JVD present. No thyromegaly present.  Cardiovascular: Normal rate, regular rhythm, normal heart sounds and intact distal pulses.  Exam reveals no gallop and no friction rub.   No murmur heard. Pulmonary/Chest: Effort normal and breath sounds normal. No respiratory distress. She has no  wheezes. She has no rales.  Abdominal: Soft. Bowel sounds are normal. She exhibits no distension. There is no tenderness. There is no rebound and no guarding.  Genitourinary:  Genitourinary Comments: Incontinent for both bowel and bladder  Musculoskeletal: She exhibits no edema.  Bilateral lower extremities weakness   Lymphadenopathy:    She has no cervical adenopathy.  Neurological: She is alert.  Skin: Skin is warm and dry. No rash noted. No erythema. No pallor.  Psychiatric: She has a normal mood and affect.    Labs reviewed:  Recent Labs  09/11/15 02/10/16 05/25/16  NA 148* 148* 150*  K 4.1 5.0 4.5  BUN 35* 42* 42*  CREATININE 1.2* 1.1 1.5*    Recent Labs  08/11/15 05/25/16  AST 34 81*  ALT 38* 92*  ALKPHOS 213* 389*    Recent Labs  07/24/15 09/11/15 05/25/16  WBC 6.2 7.3 10.4  HGB 11.6* 10.7* 10.7*  HCT 39 36 35*  PLT 283 263 320   Lab Results  Component Value Date   TSH 0.02 (A) 05/25/2016   Lab Results  Component Value Date   HGBA1C 5.2  05/25/2016   Lab Results  Component Value Date   CHOL 157 04/20/2016   HDL 38 04/20/2016   LDLCALC 108 04/20/2016   TRIG 56 04/20/2016   Assessment/Plan 1. Hyperthyroidism  Asymptomatic. Recent TSH level < 0.015 ( 05/25/2016).obtain EKG.Will refer to endocrinologist for evaluation.    2. Hypernatremia  Na 150, BUN 42, CR 1.51 ( 05/25/2016).she reports does not drink water.Lab work discussed patient's lab work with Dr. Bubba Camp who recommends IVF administration D5W @ 75 cc/hr X 1 liter. Encourage fluid intake 250 cc every shift.Obtain CMP 05/25/2016.   3.Elevated Liver Enzymes  AST 81, ALT 92 ALK phosp 389 Hold Lipitor 10 mg tablet. Recheck LFT's 05/30/2016.Repeat CMP 06/02/2016.     4. Hypoalbuminemia  ALB 2.65 ( 05/25/2016).RD consult for supplements. Continue to monitor.     Family/ staff Communication: Reviewed plan of care with patient and facility Nurse supervisor   Labs/tests ordered: CMP 05/30/2016 and  06/02/2016 Sandrea Hughs, NP

## 2016-05-30 LAB — BASIC METABOLIC PANEL
BUN: 43 mg/dL — AB (ref 4–21)
Creatinine: 1.5 mg/dL — AB (ref 0.5–1.1)
GLUCOSE: 91 mg/dL
POTASSIUM: 4.4 mmol/L (ref 3.4–5.3)
SODIUM: 149 mmol/L — AB (ref 137–147)

## 2016-05-30 LAB — HEPATIC FUNCTION PANEL
ALT: 97 U/L — AB (ref 7–35)
AST: 66 U/L — AB (ref 13–35)
Alkaline Phosphatase: 414 U/L — AB (ref 25–125)
Bilirubin, Total: 0.3 mg/dL

## 2016-05-31 ENCOUNTER — Encounter: Payer: Self-pay | Admitting: Internal Medicine

## 2016-05-31 ENCOUNTER — Non-Acute Institutional Stay (SKILLED_NURSING_FACILITY): Payer: Medicare Other | Admitting: Internal Medicine

## 2016-05-31 DIAGNOSIS — N179 Acute kidney failure, unspecified: Secondary | ICD-10-CM

## 2016-05-31 DIAGNOSIS — R748 Abnormal levels of other serum enzymes: Secondary | ICD-10-CM

## 2016-05-31 DIAGNOSIS — N183 Chronic kidney disease, stage 3 (moderate): Secondary | ICD-10-CM

## 2016-05-31 DIAGNOSIS — E870 Hyperosmolality and hypernatremia: Secondary | ICD-10-CM

## 2016-05-31 DIAGNOSIS — I10 Essential (primary) hypertension: Secondary | ICD-10-CM | POA: Diagnosis not present

## 2016-05-31 NOTE — Progress Notes (Signed)
Patient ID: Sylvia Medina, female   DOB: Dec 26, 1935, 81 y.o.   MRN: [id number]     Facility: [facility]   Chief Complaint  Patient presents with  . Acute Visit    Abnormal labs   No Known Allergies   Code status: DNR  Advanced Directives 05/27/2016  Does Patient Have a Medical Advance Directive? Yes  Type of Advance Directive Out of facility DNR (pink MOST or yellow form)  Does patient want to make changes to medical advance directive? No - Patient declined  Copy of Healthcare Power of Attorney in Chart? -  Would patient like information on creating a medical advance directive? -  Pre-existing out of facility DNR order (yellow form or pink MOST form) Yellow form placed in chart (order not valid for inpatient use)    HPI 81 y/o female is seen today for acute visit. Her labwork shows elevated sodium level along with BUN and Creatinine. She is seen in her room today. No acute change in mental state has been reported by nursing. she has dementia at baseline. She denies any concerns. She feeds herself. She requires assistance with her other ADLs.   ROS Constitutional: Negative for fever, chills Respiratory: Negative for cough, shortness of breath and wheezing.   Cardiovascular: Negative for chest pain, palpitations Gastrointestinal: Negative for heartburn, nausea, vomiting, abdominal pain, constipation.  Genitourinary: Negative for dysuria. Musculoskeletal: Negative for back pain, fall in the facility.   Skin: Negative for itching and rash.  Neurological: Negative for dizziness, headache.  Psychiatric/Behavioral: positive for dementia.     Past Medical History:  Diagnosis Date  . Anemia 07/04/2012  . Cardiac arrhythmia 07/04/2012  . CVA (cerebral vascular accident) ([facility]) 07/04/2012  . Dementia 07/04/2012  . Diabetes mellitus type 2, diet-controlled ([facility]) 07/04/2012  . Unspecified constipation 07/04/2012  . Unspecified hypothyroidism 07/04/2012  .  Unspecified vitamin D deficiency 07/04/2012   No past surgical history on file. Allergies as of 05/31/2016   No Known Allergies     Medication List       Accurate as of 05/31/16 11:43 AM. Always use your most recent med list.          acetaminophen 325 MG tablet Commonly known as:  TYLENOL Take 650 mg by mouth 2 (two) times daily. foot pain. Do not exceed 4gms of Tylenol in 24 hours   Cholecalciferol 1000 units tablet Take 1 tablet (1,000 Units total) by mouth daily.   feeding supplement (PRO-STAT SUGAR FREE 64) Liqd Take 30 mLs by mouth 2 (two) times daily.   ferrous sulfate 325 (65 FE) MG tablet Take 325 mg by mouth daily with breakfast.   lisinopril 20 MG tablet Commonly known as:  PRINIVIL,ZESTRIL Take 20 mg by mouth daily. Hold for SBP < 110 For HTN   metoprolol tartrate 25 MG tablet Commonly known as:  LOPRESSOR Take 25 mg by mouth 2 (two) times daily. For HTN   multivitamin tablet Take 1 tablet by mouth daily.   polyethylene glycol powder powder Commonly known as:  GLYCOLAX/MIRALAX Take 17 g by mouth daily. Mix in 4-8 ounces of Orange Juice and take by mouth daily for constipation.   Rivastigmine 13.3 MG/24HR Pt24 Commonly known as:  EXELON Place 1 patch (13.3 mg total) onto the skin daily.   UNABLE TO FIND Med Name: Med pass 2.0 take 60 mL by mouth 2 times daily      Physical exam BP (!) 148/86   Pulse 68  Resp 18   Ht  (1.651 m)   Wt 243 lb 1.6 oz (110.3 kg)   BMI 40.45 kg/m   Wt Readings from Last 3 Encounters:  05/31/16 243 lb 1.6 oz (110.3 kg)  05/27/16 243 lb 1.6 oz (110.3 kg)  05/24/16 243 lb 1.6 oz (110.3 kg)   General- elderly female in no acute distress, obese Neck- no lymphadenopathy Cardiovascular- irregular heart rate, no murmur Respiratory- bilateral clear to auscultation, no wheeze, no rhonchi, no crackles, no use of accessory muscles Abdomen- bowel sounds present, soft, non tender Musculoskeletal- able to move all 4  extremities, mainly in bed and requires assistance with her ADLS, has chronic lymphedema Neurological- she is alert and oriented to person and place but not to time.  Skin- warm and dry Psychiatry- pleasantly confused  Labs  CBC Latest Ref Rng & Units 05/25/2016 09/11/2015 07/24/2015  WBC 10:3/mL 10.4 7.3 6.2  Hemoglobin 12.0 - 16.0 g/dL 10.7(A) 10.7(A) 11.6(A)  Hematocrit 36 - 46 % 35(A) 36 39  Platelets 150 - 399 K/L 320 263 283   CMP Latest Ref Rng & Units 05/30/2016 05/25/2016 02/10/2016  Glucose 70 - 99 mg/dL - - -  BUN 4 - 21 mg/dL 29(F) 62(Z) 30(Q)  Creatinine 0.5 - 1.1 mg/dL 6.5(H) 8.4(O) 1.1  Sodium 137 - 147 mmol/L 149(A) 150(A) 148(A)  Potassium 3.4 - 5.3 mmol/L 4.4 4.5 5.0  Chloride 96 - 112 mEq/L - - -  CO2 19 - 32 mEq/L - - -  Calcium 8.4 - 10.5 mg/dL - - -  Alkaline Phos 25 - 125 U/L 414(A) 389(A) -  AST 13 - 35 U/L 66(A) 81(A) -  ALT 7 - 35 U/L 97(A) 92(A) -   Lab Results  Component Value Date   TSH 0.02 (A) 05/25/2016   Lipid Panel     Component Value Date/Time   CHOL 157 04/20/2016   TRIG 56 04/20/2016   HDL 38 04/20/2016   LDLCALC 108 04/20/2016     Assessment/Plan  Hypernatremia She has dementia and lab work shows elevated Na, BUN and Cr level. Concern for dehydration contributing to this. Reviewed her medication list. No acute change in mental state noted. Has chronic hypernatremia. No diarrhea or vomiting has been noted. To provide water 500 cc every shift for now. Encourage fluid intake. Provide D5W 75 cc/hr x 1 lire. Check serum and urine osmolality with BMP 06/02/16.   Acute on ckd stage 3 Worsened drenal function. Her dehydration and lisinopril could be contributing. Hold lisinopril for now and monitor BMP  Uncontrolled HTN Elevated BP on review. Increased metoprol tartrate to 50 mg bid. D/c lisinopril with impaired renal function.  Elevated liver enzyme Off statin for a week now. D/c tylenol as well and monitor LFT. Denies abdominal pain,  nausea or vomiting. If no improvement, will get abdominal ultrasound.   Labs- bmp, LFT, urine and serum osmolality 06/02/16    Oneal Grout, MD Internal Medicine [facility] [address] [place], [place] [address] Cell Phone (Monday-Friday 8 am - 5 pm): [phone] On Call: [phone] and follow prompts after 5 pm and on weekends Office Phone: [phone] Office Fax: [phone]

## 2016-06-02 LAB — HEPATIC FUNCTION PANEL
ALT: 49 U/L — AB (ref 7–35)
AST: 28 U/L (ref 13–35)
Alkaline Phosphatase: 388 U/L — AB (ref 25–125)
BILIRUBIN, TOTAL: 0.3 mg/dL

## 2016-06-02 LAB — BASIC METABOLIC PANEL
BUN: 44 mg/dL — AB (ref 4–21)
Creatinine: 1.7 mg/dL — AB (ref 0.5–1.1)
GLUCOSE: 82 mg/dL
Potassium: 3.9 mmol/L (ref 3.4–5.3)
SODIUM: 157 mmol/L — AB (ref 137–147)

## 2016-06-06 ENCOUNTER — Non-Acute Institutional Stay (SKILLED_NURSING_FACILITY): Payer: Medicare Other | Admitting: Internal Medicine

## 2016-06-06 ENCOUNTER — Encounter (HOSPITAL_COMMUNITY): Payer: Self-pay

## 2016-06-06 ENCOUNTER — Encounter: Payer: Self-pay | Admitting: Internal Medicine

## 2016-06-06 ENCOUNTER — Emergency Department (HOSPITAL_COMMUNITY): Payer: Medicare Other

## 2016-06-06 ENCOUNTER — Inpatient Hospital Stay (HOSPITAL_COMMUNITY)
Admission: EM | Admit: 2016-06-06 | Discharge: 2016-06-09 | DRG: 682 | Disposition: A | Discharge status: Skilled Nursing Facility | Payer: Medicare Other | Attending: Internal Medicine | Admitting: Internal Medicine

## 2016-06-06 DIAGNOSIS — E119 Type 2 diabetes mellitus without complications: Secondary | ICD-10-CM

## 2016-06-06 DIAGNOSIS — Z8673 Personal history of transient ischemic attack (TIA), and cerebral infarction without residual deficits: Secondary | ICD-10-CM

## 2016-06-06 DIAGNOSIS — N183 Chronic kidney disease, stage 3 (moderate): Secondary | ICD-10-CM

## 2016-06-06 DIAGNOSIS — N3001 Acute cystitis with hematuria: Secondary | ICD-10-CM | POA: Insufficient documentation

## 2016-06-06 DIAGNOSIS — E860 Dehydration: Secondary | ICD-10-CM | POA: Diagnosis present

## 2016-06-06 DIAGNOSIS — I10 Essential (primary) hypertension: Secondary | ICD-10-CM | POA: Diagnosis not present

## 2016-06-06 DIAGNOSIS — E870 Hyperosmolality and hypernatremia: Secondary | ICD-10-CM | POA: Diagnosis present

## 2016-06-06 DIAGNOSIS — R945 Abnormal results of liver function studies: Secondary | ICD-10-CM | POA: Diagnosis present

## 2016-06-06 DIAGNOSIS — Z6841 Body Mass Index (BMI) 40.0 and over, adult: Secondary | ICD-10-CM

## 2016-06-06 DIAGNOSIS — N182 Chronic kidney disease, stage 2 (mild): Secondary | ICD-10-CM | POA: Diagnosis present

## 2016-06-06 DIAGNOSIS — N179 Acute kidney failure, unspecified: Secondary | ICD-10-CM

## 2016-06-06 DIAGNOSIS — E46 Unspecified protein-calorie malnutrition: Secondary | ICD-10-CM | POA: Diagnosis present

## 2016-06-06 DIAGNOSIS — E039 Hypothyroidism, unspecified: Secondary | ICD-10-CM | POA: Diagnosis present

## 2016-06-06 DIAGNOSIS — E669 Obesity, unspecified: Secondary | ICD-10-CM | POA: Diagnosis present

## 2016-06-06 DIAGNOSIS — N3000 Acute cystitis without hematuria: Secondary | ICD-10-CM

## 2016-06-06 DIAGNOSIS — E440 Moderate protein-calorie malnutrition: Secondary | ICD-10-CM | POA: Diagnosis present

## 2016-06-06 DIAGNOSIS — I129 Hypertensive chronic kidney disease with stage 1 through stage 4 chronic kidney disease, or unspecified chronic kidney disease: Secondary | ICD-10-CM | POA: Diagnosis present

## 2016-06-06 DIAGNOSIS — R7989 Other specified abnormal findings of blood chemistry: Secondary | ICD-10-CM

## 2016-06-06 DIAGNOSIS — N390 Urinary tract infection, site not specified: Secondary | ICD-10-CM

## 2016-06-06 DIAGNOSIS — Z79899 Other long term (current) drug therapy: Secondary | ICD-10-CM

## 2016-06-06 DIAGNOSIS — G9341 Metabolic encephalopathy: Secondary | ICD-10-CM | POA: Diagnosis present

## 2016-06-06 DIAGNOSIS — E1122 Type 2 diabetes mellitus with diabetic chronic kidney disease: Secondary | ICD-10-CM | POA: Diagnosis present

## 2016-06-06 DIAGNOSIS — Z66 Do not resuscitate: Secondary | ICD-10-CM | POA: Diagnosis present

## 2016-06-06 DIAGNOSIS — Z7401 Bed confinement status: Secondary | ICD-10-CM

## 2016-06-06 DIAGNOSIS — F0390 Unspecified dementia without behavioral disturbance: Secondary | ICD-10-CM | POA: Diagnosis present

## 2016-06-06 DIAGNOSIS — J9811 Atelectasis: Secondary | ICD-10-CM | POA: Diagnosis present

## 2016-06-06 LAB — CBC WITH DIFFERENTIAL/PLATELET
Basophils Absolute: 0 10*3/uL (ref 0.0–0.1)
Basophils Relative: 0 %
EOS ABS: 0.1 10*3/uL (ref 0.0–0.7)
EOS PCT: 1 %
HCT: 33.5 % — ABNORMAL LOW (ref 36.0–46.0)
Hemoglobin: 10.1 g/dL — ABNORMAL LOW (ref 12.0–15.0)
LYMPHS ABS: 2 10*3/uL (ref 0.7–4.0)
Lymphocytes Relative: 13 %
MCH: 25.3 pg — AB (ref 26.0–34.0)
MCHC: 30.1 g/dL (ref 30.0–36.0)
MCV: 83.8 fL (ref 78.0–100.0)
MONO ABS: 0.9 10*3/uL (ref 0.1–1.0)
MONOS PCT: 6 %
Neutro Abs: 12.7 10*3/uL — ABNORMAL HIGH (ref 1.7–7.7)
Neutrophils Relative %: 80 %
PLATELETS: 397 10*3/uL (ref 150–400)
RBC: 4 MIL/uL (ref 3.87–5.11)
RDW: 16 % — ABNORMAL HIGH (ref 11.5–15.5)
WBC: 15.7 10*3/uL — ABNORMAL HIGH (ref 4.0–10.5)

## 2016-06-06 LAB — URINALYSIS, ROUTINE W REFLEX MICROSCOPIC
BILIRUBIN URINE: NEGATIVE
Glucose, UA: NEGATIVE mg/dL
Ketones, ur: NEGATIVE mg/dL
Nitrite: NEGATIVE
PROTEIN: 100 mg/dL — AB
SPECIFIC GRAVITY, URINE: 1.01 (ref 1.005–1.030)
pH: 6 (ref 5.0–8.0)

## 2016-06-06 LAB — BASIC METABOLIC PANEL
Anion gap: 9 (ref 5–15)
BUN: 57 mg/dL — AB (ref 6–20)
CHLORIDE: 116 mmol/L — AB (ref 101–111)
CO2: 21 mmol/L — ABNORMAL LOW (ref 22–32)
CREATININE: 1.9 mg/dL — AB (ref 0.44–1.00)
Calcium: 8.7 mg/dL — ABNORMAL LOW (ref 8.9–10.3)
GFR calc Af Amer: 28 mL/min — ABNORMAL LOW (ref >60)
GFR, EST NON AFRICAN AMERICAN: 24 mL/min — AB (ref >60)
Glucose, Bld: 126 mg/dL — ABNORMAL HIGH (ref 65–99)
Potassium: 4.9 mmol/L (ref 3.5–5.1)
SODIUM: 146 mmol/L — AB (ref 135–145)

## 2016-06-06 LAB — MRSA PCR SCREENING: MRSA by PCR: NEGATIVE

## 2016-06-06 LAB — GLUCOSE, CAPILLARY
GLUCOSE-CAPILLARY: 91 mg/dL (ref 65–99)
GLUCOSE-CAPILLARY: 91 mg/dL (ref 65–99)

## 2016-06-06 LAB — MAGNESIUM: Magnesium: 1.9 mg/dL (ref 1.7–2.4)

## 2016-06-06 MED ORDER — OXYCODONE HCL 5 MG PO TABS: 5.0000 mg | ORAL_TABLET | ORAL | Status: DC | PRN

## 2016-06-06 MED ORDER — ONDANSETRON HCL 4 MG/2ML IJ SOLN: 4.0000 mg | Freq: Four times a day (QID) | INTRAMUSCULAR | Status: DC | PRN

## 2016-06-06 MED ORDER — ONDANSETRON HCL 4 MG PO TABS: 4.0000 mg | ORAL_TABLET | Freq: Four times a day (QID) | ORAL | Status: DC | PRN

## 2016-06-06 MED ORDER — PRO-STAT SUGAR FREE PO LIQD
30.0000 mL | Freq: Two times a day (BID) | ORAL | Status: DC
  Administered 2016-06-06 – 2016-06-09 (×6): 30 mL via ORAL
  Filled 2016-06-06 (×6): qty 30

## 2016-06-06 MED ORDER — INSULIN ASPART 100 UNIT/ML ~~LOC~~ SOLN
0.0000 [IU] | Freq: Three times a day (TID) | SUBCUTANEOUS | Status: DC
  Administered 2016-06-08: 1 [IU] via SUBCUTANEOUS

## 2016-06-06 MED ORDER — POLYETHYLENE GLYCOL 3350 17 G PO PACK
17.0000 g | PACK | Freq: Every day | ORAL | Status: DC
  Administered 2016-06-06 – 2016-06-07 (×2): 17 g via ORAL
  Filled 2016-06-06 (×3): qty 1

## 2016-06-06 MED ORDER — SODIUM CHLORIDE 0.45 % IV SOLN
INTRAVENOUS | Status: DC
  Administered 2016-06-06: 18:00:00 via INTRAVENOUS

## 2016-06-06 MED ORDER — LACTATED RINGERS IV SOLN
INTRAVENOUS | Status: DC
  Administered 2016-06-06: 16:00:00 via INTRAVENOUS

## 2016-06-06 MED ORDER — HEPARIN SODIUM (PORCINE) 5000 UNIT/ML IJ SOLN
5000.0000 [IU] | Freq: Three times a day (TID) | INTRAMUSCULAR | Status: DC
  Administered 2016-06-06 – 2016-06-09 (×9): 5000 [IU] via SUBCUTANEOUS
  Filled 2016-06-06 (×9): qty 1

## 2016-06-06 MED ORDER — ACETAMINOPHEN 650 MG RE SUPP: 650.0000 mg | Freq: Four times a day (QID) | RECTAL | Status: DC | PRN

## 2016-06-06 MED ORDER — LACTATED RINGERS IV BOLUS (SEPSIS)
1000.0000 mL | Freq: Once | INTRAVENOUS | Status: AC
  Administered 2016-06-06: 1000 mL via INTRAVENOUS

## 2016-06-06 MED ORDER — ACETAMINOPHEN 325 MG PO TABS: 650.0000 mg | ORAL_TABLET | Freq: Four times a day (QID) | ORAL | Status: DC | PRN

## 2016-06-06 MED ORDER — DEXTROSE 5 % IV SOLN
1.0000 g | INTRAVENOUS | Status: DC
  Administered 2016-06-07 – 2016-06-09 (×3): 1 g via INTRAVENOUS
  Filled 2016-06-06 (×4): qty 10

## 2016-06-06 MED ORDER — DICLOFENAC SODIUM 1 % TD GEL
1.0000 "application " | Freq: Two times a day (BID) | TRANSDERMAL | Status: DC
  Administered 2016-06-06 – 2016-06-09 (×5): 1 via TOPICAL
  Filled 2016-06-06: qty 100

## 2016-06-06 MED ORDER — METOPROLOL TARTRATE 50 MG PO TABS
50.0000 mg | ORAL_TABLET | Freq: Two times a day (BID) | ORAL | Status: DC
  Administered 2016-06-06 – 2016-06-09 (×6): 50 mg via ORAL
  Filled 2016-06-06 (×6): qty 1

## 2016-06-06 MED ORDER — DEXTROSE 5 % IV SOLN
1.0000 g | Freq: Once | INTRAVENOUS | Status: AC
  Administered 2016-06-06: 1 g via INTRAVENOUS
  Filled 2016-06-06: qty 10

## 2016-06-06 MED ORDER — INSULIN ASPART 100 UNIT/ML ~~LOC~~ SOLN: 0.0000 [IU] | Freq: Every day | SUBCUTANEOUS | Status: DC

## 2016-06-06 MED ORDER — HYDRALAZINE HCL 20 MG/ML IJ SOLN: 5.0000 mg | INTRAMUSCULAR | Status: DC | PRN

## 2016-06-06 MED ORDER — RIVASTIGMINE 13.3 MG/24HR TD PT24
1.0000 | MEDICATED_PATCH | TRANSDERMAL | Status: DC
  Administered 2016-06-07 – 2016-06-08 (×2): 13.3 mg via TRANSDERMAL
  Filled 2016-06-06 (×2): qty 1

## 2016-06-06 NOTE — ED Notes (Signed)
Attempted report 

## 2016-06-06 NOTE — H&P (Signed)
History and Physical    [name]:[id number] DOB: [birth year] DOA: 06/06/2016  PCP: Oneal Grout, MD Patient coming from: SNF  Chief Complaint: AMS  HPI: Sylvia Medina is a 81 y.o. female with medical history significant of CVA, arrhythmia, dementia, diabetes, constipation, hypothyroidism. Patient presenting from nursing home due to report of progressive hypernatremia and poor oral intake. Pt states she drinks 2 pitchers of water per day. Patient is without focal complaints. There are no reported complaints from the nursing home other than very poor oral intake over the last several days. Denies fevers, nausea, vomiting, diarrhea, abdominal pain, dysuria, frequency, back pain, rash, focal neurological deficit.     ED Course: Objective findings outlined below. 1 L of LR and ceftriaxone given  Review of Systems: As per HPI otherwise 10 point review of systems negative.     Past Medical History:  Diagnosis Date  . Anemia 07/04/2012  . Cardiac arrhythmia 07/04/2012  . CVA (cerebral vascular accident) ([facility]) 07/04/2012  . Dementia 07/04/2012  . Diabetes mellitus type 2, diet-controlled ([facility]) 07/04/2012  . Unspecified constipation 07/04/2012  . Unspecified hypothyroidism 07/04/2012  . Unspecified vitamin D deficiency 07/04/2012    History reviewed. No pertinent surgical history.  Social History   Social History  . Marital status: Widowed    Spouse name: N/A  . Number of children: N/A  . Years of education: N/A   Occupational History  . Not on file.   Social History Main Topics  . Smoking status: Never Smoker  . Smokeless tobacco: Never Used  . Alcohol use No  . Drug use: No  . Sexual activity: Not Currently   Other Topics Concern  . Not on file   Social History Narrative  . No narrative on file    No Known Allergies  History reviewed. No pertinent family history.  Prior to Admission medications   Medication Sig Start Date End Date Taking? Authorizing  Provider  Amino Acids-Protein Hydrolys (FEEDING SUPPLEMENT, PRO-STAT SUGAR FREE 64,) LIQD Take 30 mLs by mouth 2 (two) times daily.    Yes Historical Provider, MD  Cholecalciferol 1000 units tablet Take 1 tablet (1,000 Units total) by mouth daily. 08/31/15  Yes Dinah C Ngetich, NP  diclofenac sodium (VOLTAREN) 1 % GEL Apply 1 application topically 2 (two) times daily.    Yes Historical Provider, MD  ferrous sulfate 325 (65 FE) MG tablet Take 325 mg by mouth daily with breakfast.    Yes Historical Provider, MD  metoprolol (LOPRESSOR) 50 MG tablet Take 50 mg by mouth 2 (two) times daily.   Yes Historical Provider, MD  Multiple Vitamin (MULTIVITAMIN) tablet Take 1 tablet by mouth daily.   Yes Historical Provider, MD  polyethylene glycol powder (GLYCOLAX/MIRALAX) powder Take 17 g by mouth daily. Mix in 4-8 ounces of Orange Juice and take by mouth daily for constipation. 07/04/12  Yes Lucrezia Starch, NP  Rivastigmine (EXELON) 13.3 MG/24HR PT24 Place 1 patch (13.3 mg total) onto the skin daily. 07/04/12  Yes Lucrezia Starch, NP  UNABLE TO FIND Med Name: Med pass 2.0 take 60 mL by mouth 2 times daily   Yes Historical Provider, MD    Physical Exam: Vitals:   06/06/16 1500 06/06/16 1530 06/06/16 1545 06/06/16 1615  BP: (!) 163/73     Pulse:  71 72 73  Resp:  (!) 26 (!) 25 20  Temp:      TempSrc:      SpO2:  98% 99% 100%  Height:  General:  Appears calm and comfortable Eyes:  PERRL, EOMI, normal lids, iris ENT: Edentulous, moist mucous membranes Neck:  no LAD, masses or thyromegaly Cardiovascular:  RRR, no m/r/g. 1+ LE edema.  Respiratory:  CTA bilaterally, no w/r/r. Normal respiratory effort. Abdomen:  soft, ntnd, NABS Skin:  no rash or induration seen on limited exam Musculoskeletal:  grossly normal tone BUE/, good ROM of upper extremities, no bony abnormality Psychiatric: Alert and oriented to person place and time, answers questions appropriately, pleasant, follows commands Neurologic:   CN 2-12 grossly intact, moves all extremities in coordinated fashion, sensation intact  Labs on Admission: I have personally reviewed following labs and imaging studies  CBC:  Recent Labs Lab 06/06/16 1257  WBC 15.7*  NEUTROABS 12.7*  HGB 10.1*  HCT 33.5*  MCV 83.8  PLT 397   Basic Metabolic Panel:  Recent Labs Lab 06/02/16 06/06/16 1257  NA 157* 146*  K 3.9 4.9  CL  --  116*  CO2  --  21*  GLUCOSE  --  126*  BUN 44* 57*  CREATININE 1.7* 1.90*  CALCIUM  --  8.7*  MG  --  1.9   GFR: Estimated Creatinine Clearance: 30.2 mL/min (A) (by C-G formula based on SCr of 1.9 mg/dL (H)). Liver Function Tests:  Recent Labs Lab 06/02/16  AST 28  ALT 49*  ALKPHOS 388*   No results for input(s): LIPASE, AMYLASE in the last 168 hours. No results for input(s): AMMONIA in the last 168 hours. Coagulation Profile: No results for input(s): INR, PROTIME in the last 168 hours. Cardiac Enzymes: No results for input(s): CKTOTAL, CKMB, CKMBINDEX, TROPONINI in the last 168 hours. BNP (last 3 results) No results for input(s): PROBNP in the last 8760 hours. HbA1C: No results for input(s): HGBA1C in the last 72 hours. CBG: No results for input(s): GLUCAP in the last 168 hours. Lipid Profile: No results for input(s): CHOL, HDL, LDLCALC, TRIG, CHOLHDL, LDLDIRECT in the last 72 hours. Thyroid Function Tests: No results for input(s): TSH, T4TOTAL, FREET4, T3FREE, THYROIDAB in the last 72 hours. Anemia Panel: No results for input(s): VITAMINB12, FOLATE, FERRITIN, TIBC, IRON, RETICCTPCT in the last 72 hours. Urine analysis:    Component Value Date/Time   COLORURINE YELLOW 06/06/2016 1403   APPEARANCEUR CLOUDY (A) 06/06/2016 1403   LABSPEC 1.010 06/06/2016 1403   PHURINE 6.0 06/06/2016 1403   GLUCOSEU NEGATIVE 06/06/2016 1403   HGBUR LARGE (A) 06/06/2016 1403   BILIRUBINUR NEGATIVE 06/06/2016 1403   KETONESUR NEGATIVE 06/06/2016 1403   PROTEINUR 100 (A) 06/06/2016 1403   NITRITE  NEGATIVE 06/06/2016 1403   LEUKOCYTESUR LARGE (A) 06/06/2016 1403    Creatinine Clearance: Estimated Creatinine Clearance: 30.2 mL/min (A) (by C-G formula based on SCr of 1.9 mg/dL (H)).  Sepsis Labs: (procalcitonin:4,lacticidven:4) )No results found for this or any previous visit (from the past 240 hour(s)).   Radiological Exams on Admission: Dg Chest 2 View  Result Date: 06/06/2016 CLINICAL DATA:  Dehydration, weakness, history type II diabetes mellitus EXAM: CHEST  2 VIEW COMPARISON:  11/27/2014 FINDINGS: Enlargement of cardiac silhouette with slight pulmonary vascular congestion. Atherosclerotic calcification aorta. Atelectasis versus infiltrate LEFT lower lobe. Remaining lungs clear. No pleural effusion or pneumothorax. Bones unremarkable. IMPRESSION: Atelectasis versus infiltrate in LEFT lower lobe. Enlargement of cardiac silhouette. Aortic atherosclerosis. Electronically Signed   By: Ulyses Southward M.D.   On: 06/06/2016 15:03     Assessment/Plan Active Problems:   Diabetes mellitus type 2, diet-controlled (HCC)   Dementia without behavioral  disturbance   Essential hypertension, benign   Protein-calorie malnutrition ([facility])   Hypernatremia   AKI (acute kidney injury) ([facility])   Acute lower UTI   Hypernatremia: Chronic - currently mild. Likely secondary to poor free water intake. 146 at time of admission. Recently as high as 157. Received 1 L LR bolus in the ED - Additional 1 L L or bolus followed by half-normal saline at 50 mL per hour - In the future consider osmolality studies prior to fluid administration at nursing home  UTI: WBC 15.7, afebrile, floridly positive UA. Waxing and waning reported worsening confusion though at time of my examination patient's mental status appears to be at baseline. CXR with possible pneumonia versus atelectasis, due to patient being asymptomatic from a restaurant standpoint favor atelectasis - Ceftriaxone - UCX - IVF as above.   AoCKd:  likely from dehydration. Cr 1.9. Baseline ~1.1-1.5 (unclear due to lack of lab data) - IVF - BMET in am  DM: - SSI  HTN: - continue metoprolol  Dementia: intermittent confusion reported. At time of my exam, pt is AOx3.  - Continue Exelon  Protein calorie malnutrition:  -continue prostat     DVT prophylaxis: Hep  Code Status: DNR - confirmed at time of admission  Family Communiction: none (EDP updated daughter just prior to admission) Disposition Plan: pending improvement in labs  Consults called: none  Admission status: observation    Inna Tisdell J MD [facility]  If 7PM-7AM, please contact night-coverage [email or web address] Password [facility]  06/06/2016, 4:55 PM

## 2016-06-06 NOTE — ED Triage Notes (Signed)
Pt from [facility] by [facility] for high sodium of 157 per facility. Pt has no complaints otherwise

## 2016-06-06 NOTE — ED Notes (Signed)
Pt aware of need for urine  

## 2016-06-06 NOTE — ED Provider Notes (Signed)
[facility]-EMERGENCY DEPT Provider Note   CSN: [id number] Arrival date & time: 06/06/16  1222     History   Chief Complaint Chief Complaint  Patient presents with  . Abnormal Lab    HPI Sylvia Medina is a 81 y.o. female.  HPI 81 yo F with PMHx of dementia, CVA here with hypernatremia. Pt is currently without complaints. She has a h/o dementia limiting history. Denies any CP, SOB, HA, lightheadedness. No vision changes. Denies any headache. She states her appetite has been "fine."  Per facility, pt has had increasing sodium on labs at facility. Poor PO intake. No fevers.  Level 5 caveat invoked as remainder of history, ROS, and physical exam limited due to patient's dementia.   Past Medical History:  Diagnosis Date  . Anemia 07/04/2012  . Cardiac arrhythmia 07/04/2012  . CVA (cerebral vascular accident) (HCC) 07/04/2012  . Dementia 07/04/2012  . Diabetes mellitus type 2, diet-controlled (HCC) 07/04/2012  . Unspecified constipation 07/04/2012  . Unspecified hypothyroidism 07/04/2012  . Unspecified vitamin D deficiency 07/04/2012    Patient Active Problem List   Diagnosis Date Noted  . AKI (acute kidney injury) (HCC) 06/06/2016  . Acute lower UTI 06/06/2016  . Acute cystitis with hematuria   . Hypernatremia 08/31/2015  . Chronic constipation 07/27/2015  . Protein-calorie malnutrition (HCC) 07/27/2015  . Alzheimer's disease 09/18/2014  . Hyperlipidemia 09/18/2014  . Anemia of chronic disease 09/18/2014  . Constipation 11/17/2013  . Essential hypertension, benign 10/07/2013  . Thyroiditis, subacute 04/14/2013  . Dementia without behavioral disturbance 02/10/2013  . Vitamin D deficiency 07/04/2012  . Depression 07/04/2012  . Diabetes mellitus type 2, diet-controlled (HCC) 07/04/2012  . CVA (cerebral vascular accident) (HCC) 07/04/2012  . Cardiac arrhythmia 07/04/2012    History reviewed. No pertinent surgical history.  OB History    No data available       Home  Medications    Prior to Admission medications   Medication Sig Start Date End Date Taking? Authorizing Provider  Amino Acids-Protein Hydrolys (FEEDING SUPPLEMENT, PRO-STAT SUGAR FREE 64,) LIQD Take 30 mLs by mouth 2 (two) times daily.    Yes Historical Provider, MD  Cholecalciferol 1000 units tablet Take 1 tablet (1,000 Units total) by mouth daily. 08/31/15  Yes Dinah C Ngetich, NP  diclofenac sodium (VOLTAREN) 1 % GEL Apply 1 application topically 2 (two) times daily.    Yes Historical Provider, MD  ferrous sulfate 325 (65 FE) MG tablet Take 325 mg by mouth daily with breakfast.    Yes Historical Provider, MD  metoprolol (LOPRESSOR) 50 MG tablet Take 50 mg by mouth 2 (two) times daily.   Yes Historical Provider, MD  Multiple Vitamin (MULTIVITAMIN) tablet Take 1 tablet by mouth daily.   Yes Historical Provider, MD  polyethylene glycol powder (GLYCOLAX/MIRALAX) powder Take 17 g by mouth daily. Mix in 4-8 ounces of Orange Juice and take by mouth daily for constipation. 07/04/12  Yes Lucrezia Starch, NP  Rivastigmine (EXELON) 13.3 MG/24HR PT24 Place 1 patch (13.3 mg total) onto the skin daily. 07/04/12  Yes Lucrezia Starch, NP  UNABLE TO FIND Med Name: Med pass 2.0 take 60 mL by mouth 2 times daily   Yes Historical Provider, MD    Family History History reviewed. No pertinent family history.  Social History Social History  Substance Use Topics  . Smoking status: Never Smoker  . Smokeless tobacco: Never Used  . Alcohol use No     Allergies   Patient has no known allergies.  Review of Systems Review of Systems  Unable to perform ROS: Dementia     Physical Exam Updated Vital Signs BP (!) 163/73   Pulse 73   Temp 98.3 F (36.8 C) (Oral)   Resp 20   Ht  (1.651 m)   SpO2 100%   Physical Exam  Constitutional: She is oriented to person, place, and time. She appears well-developed and well-nourished. No distress.  HENT:  Head: Normocephalic and atraumatic.  Moderately dry MM    Eyes: Conjunctivae are normal.  Neck: Neck supple.  Cardiovascular: Normal rate, regular rhythm and normal heart sounds.  Exam reveals no friction rub.   No murmur heard. Pulmonary/Chest: Effort normal and breath sounds normal. No respiratory distress. She has no wheezes. She has no rales.  Abdominal: She exhibits no distension.  Musculoskeletal: She exhibits no edema.  Neurological: She is alert and oriented to person, place, and time. She exhibits normal muscle tone.  Skin: Skin is warm. Capillary refill takes less than 2 seconds.  Psychiatric: She has a normal mood and affect.  Nursing note and vitals reviewed.    ED Treatments / Results  Labs (all labs ordered are listed, but only abnormal results are displayed) Labs Reviewed  CBC WITH DIFFERENTIAL/PLATELET - Abnormal; Notable for the following:       Result Value   WBC 15.7 (*)    Hemoglobin 10.1 (*)    HCT 33.5 (*)    MCH 25.3 (*)    RDW 16.0 (*)    Neutro Abs 12.7 (*)    All other components within normal limits  BASIC METABOLIC PANEL - Abnormal; Notable for the following:    Sodium 146 (*)    Chloride 116 (*)    CO2 21 (*)    Glucose, Bld 126 (*)    BUN 57 (*)    Creatinine, Ser 1.90 (*)    Calcium 8.7 (*)    GFR calc non Af Amer 24 (*)    GFR calc Af Amer 28 (*)    All other components within normal limits  URINALYSIS, ROUTINE W REFLEX MICROSCOPIC - Abnormal; Notable for the following:    APPearance CLOUDY (*)    Hgb urine dipstick LARGE (*)    Protein, ur 100 (*)    Leukocytes, UA LARGE (*)    Bacteria, UA MANY (*)    Squamous Epithelial / LPF 0-5 (*)    All other components within normal limits  URINE CULTURE  URINE CULTURE  MAGNESIUM  CBC  BASIC METABOLIC PANEL    EKG  EKG Interpretation None       Radiology Dg Chest 2 View  Result Date: 06/06/2016 CLINICAL DATA:  Dehydration, weakness, history type II diabetes mellitus EXAM: CHEST  2 VIEW COMPARISON:  11/27/2014 FINDINGS: Enlargement of  cardiac silhouette with slight pulmonary vascular congestion. Atherosclerotic calcification aorta. Atelectasis versus infiltrate LEFT lower lobe. Remaining lungs clear. No pleural effusion or pneumothorax. Bones unremarkable. IMPRESSION: Atelectasis versus infiltrate in LEFT lower lobe. Enlargement of cardiac silhouette. Aortic atherosclerosis. Electronically Signed   By: Ulyses Southward M.D.   On: 06/06/2016 15:03    Procedures Procedures (including critical care time)  Medications Ordered in ED Medications  lactated ringers infusion ( Intravenous New Bag/Given 06/06/16 1535)  lactated ringers bolus 1,000 mL (not administered)  metoprolol tartrate (LOPRESSOR) tablet 50 mg (not administered)  diclofenac sodium (VOLTAREN) 1 % transdermal gel 1 application (not administered)  feeding supplement (PRO-STAT SUGAR FREE 64) liquid 30 mL (not administered)  Rivastigmine PT24 13.3 mg (not administered)  polyethylene glycol (MIRALAX / GLYCOLAX) packet 17 g (not administered)  heparin injection 5,000 Units (not administered)  0.45 % sodium chloride infusion (not administered)  acetaminophen (TYLENOL) tablet 650 mg (not administered)    Or  acetaminophen (TYLENOL) suppository 650 mg (not administered)  oxyCODONE (Oxy IR/ROXICODONE) immediate release tablet 5 mg (not administered)  ondansetron (ZOFRAN) tablet 4 mg (not administered)    Or  ondansetron (ZOFRAN) injection 4 mg (not administered)  insulin aspart (novoLOG) injection 0-9 Units (not administered)  insulin aspart (novoLOG) injection 0-5 Units (not administered)  cefTRIAXone (ROCEPHIN) 1 g in dextrose 5 % 50 mL IVPB (not administered)  hydrALAZINE (APRESOLINE) injection 5-10 mg (not administered)  lactated ringers bolus 1,000 mL (0 mLs Intravenous Stopped 06/06/16 1401)  cefTRIAXone (ROCEPHIN) 1 g in dextrose 5 % 50 mL IVPB (0 g Intravenous Stopped 06/06/16 1537)     Initial Impression / Assessment and Plan / ED Course  I have reviewed the  triage vital signs and the nursing notes.  Pertinent labs & imaging results that were available during my care of the patient were reviewed by me and considered in my medical decision making (see chart for details).     81 yo F with PMHx as above sent from SNF for evaluation of poor PO intake, increasing sodium at SNF. On arrival, VSS. Exam as above, remarkable for significant dehydration. Labs show significant hypernatremia, AKI, as well as +UTI. Given AKI and UTi with poor PO intake, will admit for IVF and hydration. Pt in agreement.  Final Clinical Impressions(s) / ED Diagnoses   Final diagnoses:  AKI (acute kidney injury) (HCC)  Hypernatremia  Dehydration    New Prescriptions Current Discharge Medication List       Shaune Pollack, MD 06/06/16 1701

## 2016-06-06 NOTE — Progress Notes (Signed)
Patient ID: Sylvia Medina, female   DOB: Mar 17, 1935, 81 y.o.   MRN: [id number]     Facility: [facility]   Chief Complaint  Patient presents with  . Acute Visit    Hypernatremia and UTI   No Known Allergies   Code status: DNR  Advanced Directives 06/06/2016  Does Patient Have a Medical Advance Directive? Yes  Type of Advance Directive Out of facility DNR (pink MOST or yellow form)  Does patient want to make changes to medical advance directive? -  Copy of [place] in Chart? -  Would patient like information on creating a medical advance directive? -  Pre-existing out of facility DNR order (yellow form or pink MOST form) -    HPI 81 y/o female is seen today for acute visit. Her labwork shows elevated sodium level of 157 with elevated BUN and creatinine. She was written for iv fluids and to be provided with po fluids as well. She has dementia and has poor hydration. Per nursing, they were not able to provide iv fluids due to refusal by patient. Provider was not notified about this until today. Per CNA, she drinks water minimally. She is seen in her room today.  Her lab work shows UTI.   ROS Constitutional: Negative for fever and headache Respiratory: Negative for cough, shortness of breath Cardiovascular: Negative for chest pain Gastrointestinal: Negative for heartburn, nausea, vomiting, abdominal pain, diarrhea Genitourinary: Negative for dysuria. Musculoskeletal: Negative for back pain, fall in the facility.   Skin: Negative for itching and rash.  Neurological: Negative for acute change of mental state by nursing  Psychiatric/Behavioral: positive for dementia.     Past Medical History:  Diagnosis Date  . Anemia 07/04/2012  . Cardiac arrhythmia 07/04/2012  . CVA (cerebral vascular accident) ([place]) 07/04/2012  . Dementia 07/04/2012  . Diabetes mellitus type 2, diet-controlled ([place]) 07/04/2012  . Unspecified constipation 07/04/2012    . Unspecified hypothyroidism 07/04/2012  . Unspecified vitamin D deficiency 07/04/2012   No past surgical history on file. Allergies as of 06/06/2016   No Known Allergies     Medication List       Accurate as of 06/06/16  1:18 PM. Always use your most recent med list.          Cholecalciferol 1000 units tablet Take 1 tablet (1,000 Units total) by mouth daily.   diclofenac sodium 1 % Gel Commonly known as:  VOLTAREN Apply topically 2 (two) times daily.   feeding supplement (PRO-STAT SUGAR FREE 64) Liqd Take 30 mLs by mouth 2 (two) times daily.   ferrous sulfate 325 (65 FE) MG tablet Take 325 mg by mouth daily with breakfast.   metoprolol 50 MG tablet Commonly known as:  LOPRESSOR Take 50 mg by mouth 2 (two) times daily.   multivitamin tablet Take 1 tablet by mouth daily.   polyethylene glycol powder powder Commonly known as:  GLYCOLAX/MIRALAX Take 17 g by mouth daily. Mix in 4-8 ounces of Orange Juice and take by mouth daily for constipation.   Rivastigmine 13.3 MG/24HR Pt24 Commonly known as:  EXELON Place 1 patch (13.3 mg total) onto the skin daily.   UNABLE TO FIND Med Name: Med pass 2.0 take 60 mL by mouth 2 times daily      Physical exam BP 132/60   Pulse 68   Resp 18   Ht _0  (1.651 m)   Wt 243 lb 1.6 oz (110.3 kg)   BMI 40.45  kg/m   Wt Readings from Last 3 Encounters:  06/06/16 243 lb 1.6 oz (110.3 kg)  05/31/16 243 lb 1.6 oz (110.3 kg)  05/27/16 243 lb 1.6 oz (110.3 kg)   General- elderly female in no acute distress, obese Neck- no lymphadenopathy Cardiovascular- irregular heart rate, no murmur Respiratory- bilateral clear to auscultation, no wheeze, no rhonchi, no crackles, no use of accessory muscles Abdomen- bowel sounds present, soft, non tender Musculoskeletal- able to move all 4 extremities, has chronic lymphedema Neurological- alert and oriented to person and place but not to time.  Skin- warm and dry. Good skin turgor Psychiatry-  pleasantly confused   Labs: reviewed  Assessment/Plan  Hypernatremia Further elevation in Na to 157. Per nursing, iv access has been tried without success. No clysis kit available at the facility. Given worsening of her hypernatremia which is likely related to dehydration and new UTI, send patient to ED for evaluation and correction of acute on chronic hypernatremia.  UTI Sending patient to ED for fluid resucitation. Will recommend to start antibiotic treatment there. advised nursing to send report of urine study.  Acute on ckd stage 3 Worsened renal function. Her dehydration is contributing to this along with UTI. See above    Blanchie Serve, MD Internal Medicine [facility] [address] [place],  [address] Cell Phone (Monday-Friday 8 am - 5 pm): [phone] On Call: [phone] and follow prompts after 5 pm and on weekends Office Phone: [phone] Office Fax: [phone]

## 2016-06-07 DIAGNOSIS — N390 Urinary tract infection, site not specified: Secondary | ICD-10-CM | POA: Diagnosis not present

## 2016-06-07 DIAGNOSIS — F0390 Unspecified dementia without behavioral disturbance: Secondary | ICD-10-CM

## 2016-06-07 DIAGNOSIS — N179 Acute kidney failure, unspecified: Secondary | ICD-10-CM | POA: Diagnosis not present

## 2016-06-07 DIAGNOSIS — E870 Hyperosmolality and hypernatremia: Secondary | ICD-10-CM | POA: Diagnosis not present

## 2016-06-07 LAB — URINE CULTURE

## 2016-06-07 LAB — HEPATIC FUNCTION PANEL
ALBUMIN: 1.9 g/dL — AB (ref 3.5–5.0)
ALK PHOS: 316 U/L — AB (ref 38–126)
ALT: 42 U/L (ref 14–54)
AST: 36 U/L (ref 15–41)
BILIRUBIN TOTAL: 0.3 mg/dL (ref 0.3–1.2)
Bilirubin, Direct: 0.1 mg/dL (ref 0.1–0.5)
Indirect Bilirubin: 0.2 mg/dL — ABNORMAL LOW (ref 0.3–0.9)
Total Protein: 6.4 g/dL — ABNORMAL LOW (ref 6.5–8.1)

## 2016-06-07 LAB — CBC
HEMATOCRIT: 32.1 % — AB (ref 36.0–46.0)
Hemoglobin: 9.7 g/dL — ABNORMAL LOW (ref 12.0–15.0)
MCH: 25.1 pg — ABNORMAL LOW (ref 26.0–34.0)
MCHC: 30.2 g/dL (ref 30.0–36.0)
MCV: 82.9 fL (ref 78.0–100.0)
Platelets: 395 10*3/uL (ref 150–400)
RBC: 3.87 MIL/uL (ref 3.87–5.11)
RDW: 16 % — AB (ref 11.5–15.5)
WBC: 16.8 10*3/uL — AB (ref 4.0–10.5)

## 2016-06-07 LAB — BASIC METABOLIC PANEL
ANION GAP: 8 (ref 5–15)
BUN: 51 mg/dL — ABNORMAL HIGH (ref 6–20)
CALCIUM: 8.9 mg/dL (ref 8.9–10.3)
CO2: 23 mmol/L (ref 22–32)
Chloride: 117 mmol/L — ABNORMAL HIGH (ref 101–111)
Creatinine, Ser: 1.82 mg/dL — ABNORMAL HIGH (ref 0.44–1.00)
GFR calc Af Amer: 30 mL/min — ABNORMAL LOW (ref >60)
GFR calc non Af Amer: 25 mL/min — ABNORMAL LOW (ref >60)
GLUCOSE: 96 mg/dL (ref 65–99)
Potassium: 3.9 mmol/L (ref 3.5–5.1)
Sodium: 148 mmol/L — ABNORMAL HIGH (ref 135–145)

## 2016-06-07 LAB — GLUCOSE, CAPILLARY
GLUCOSE-CAPILLARY: 115 mg/dL — AB (ref 65–99)
Glucose-Capillary: 80 mg/dL (ref 65–99)
Glucose-Capillary: 103 mg/dL — ABNORMAL HIGH (ref 65–99)
Glucose-Capillary: 103 mg/dL — ABNORMAL HIGH (ref 65–99)

## 2016-06-07 MED ORDER — DEXTROSE 5 % IV SOLN
INTRAVENOUS | Status: DC
  Administered 2016-06-07 – 2016-06-09 (×3): via INTRAVENOUS

## 2016-06-07 NOTE — Progress Notes (Addendum)
PROGRESS NOTE    Sylvia Medina  UJW:[id number] DOB: 10-Aug-1935 DOA: 06/06/2016 PCP: Oneal Grout, MD  Brief Narrative:  Sylvia Medina is a 81 y.o. female with medical history significant of CVA, dementia, diabetes, hypothyroidism, obesity,  bed bound resident of SNF, was sent to the ER due to report of progressive hypernatremia and poor oral intake.  Assessment & Plan:     Hypernatremia/mild -suspect due to poor free water intake, she tells me she thinks she drinks a pitcher a day -Na was 157 at SNF last week, trending down now -Na 148 this am, change IVF from 1/2 NS to D5W at 50cc/hr -clinically doesn't appear very dry at this time,based on SNF notes appears that she was recently started on hypotonic fluids there  AKi on CKD2 -baseline creatinine 1.1 in 12/17 -now 1.9, hydrate and monitor  Abnormal LFTS/elevated ALP on recent SNF labs -add on LFTS to am labs -no abd pain, or s/s of GB disease -will check RUQ [place] to evaluate Bile ducts  Suspected UTI:  -WBC 15.7, afebrile, frankly abnormal UA -continue Ceftriaxone -FU Urine Cx  DM: -not on meds at SNF, check hba1c - SSI  HTN: - continue metoprolol  Dementia: intermittent confusion reported per SNF -stable, Continue Exelon  Protein calorie malnutrition:  -continue prostat, albumin only 1.9 -eval liver echogenicity on US  DVT prophylaxis: Hep SQ Code Status: DNR  Family Communiction: none at bedside Disposition Plan: SNF pending improvement   Antimicrobials:   Ceftriaxone 4/23   Subjective: -feels ok, denies any complaints  Objective: Vitals:   06/06/16 2100 06/06/16 2140 06/07/16 0529 06/07/16 0900  BP:  (!) 147/50 (!) 138/53 (!) 140/52  Pulse:  80 70   Resp:  18 18   Temp:  98.3 F (36.8 C) 98.7 F (37.1 C)   TempSrc:  Oral    SpO2:   98%   Height:  (1.651 m)       Intake/Output Summary (Last 24 hours) at 06/07/16 1118 Last data filed at 06/07/16 5621  Gross per 24 hour  Intake              2385 ml  Output                0 ml  Net             2385 ml   There were no vitals filed for this visit.  Examination:  General exam: elderly, obese, debilitated female, laying in bed, no distress Respiratory system: Clear to auscultation, poor air movement Cardiovascular system: S1 & S2 heard, RRR. No JVD, murmurs Gastrointestinal system: Abdomen is obese, nondistended, soft and nontender.  + bowel sounds heard. Central nervous system: Alert and oriented to self and place, No focal neurological deficits. Extremities: Very obese, deconditioned, no edema Skin: No rashes, lesions or ulcers Psychiatry: Judgement and insight appear normal. Mood & affect appropriate.     Data Reviewed:   CBC:  Recent Labs Lab 06/06/16 1257 06/07/16 0315  WBC 15.7* 16.8*  NEUTROABS 12.7*  --   HGB 10.1* 9.7*  HCT 33.5* 32.1*  MCV 83.8 82.9  PLT 397 395   Basic Metabolic Panel:  Recent Labs Lab 06/02/16 06/06/16 1257 06/07/16 0315  NA 157* 146* 148*  K 3.9 4.9 3.9  CL  --  116* 117*  CO2  --  21* 23  GLUCOSE  --  126* 96  BUN 44* 57* 51*  CREATININE 1.7* 1.90* 1.82*  CALCIUM  --  8.7* 8.9  MG  --  1.9  --    GFR: Estimated Creatinine Clearance: 31.5 mL/min (A) (by C-G formula based on SCr of 1.82 mg/dL (H)). Liver Function Tests:  Recent Labs Lab 06/02/16 06/07/16 0500  AST 28 36  ALT 49* 42  ALKPHOS 388* 316*  BILITOT  --  0.3  PROT  --  6.4*  ALBUMIN  --  1.9*   No results for input(s): LIPASE, AMYLASE in the last 168 hours. No results for input(s): AMMONIA in the last 168 hours. Coagulation Profile: No results for input(s): INR, PROTIME in the last 168 hours. Cardiac Enzymes: No results for input(s): CKTOTAL, CKMB, CKMBINDEX, TROPONINI in the last 168 hours. BNP (last 3 results) No results for input(s): PROBNP in the last 8760 hours. HbA1C: No results for input(s): HGBA1C in the last 72 hours. CBG:  Recent Labs Lab 06/06/16 1836 06/06/16 2138  06/07/16 0811  GLUCAP 91 91 80   Lipid Profile: No results for input(s): CHOL, HDL, LDLCALC, TRIG, CHOLHDL, LDLDIRECT in the last 72 hours. Thyroid Function Tests: No results for input(s): TSH, T4TOTAL, FREET4, T3FREE, THYROIDAB in the last 72 hours. Anemia Panel: No results for input(s): VITAMINB12, FOLATE, FERRITIN, TIBC, IRON, RETICCTPCT in the last 72 hours. Urine analysis:    Component Value Date/Time   COLORURINE YELLOW 06/06/2016 1403   APPEARANCEUR CLOUDY (A) 06/06/2016 1403   LABSPEC 1.010 06/06/2016 1403   PHURINE 6.0 06/06/2016 1403   GLUCOSEU NEGATIVE 06/06/2016 1403   HGBUR LARGE (A) 06/06/2016 1403   BILIRUBINUR NEGATIVE 06/06/2016 1403   KETONESUR NEGATIVE 06/06/2016 1403   PROTEINUR 100 (A) 06/06/2016 1403   NITRITE NEGATIVE 06/06/2016 1403   LEUKOCYTESUR LARGE (A) 06/06/2016 1403   Sepsis Labs: (procalcitonin:4,lacticidven:4)  ) Recent Results (from the past 240 hour(s))  MRSA PCR Screening     Status: None   Collection Time: 06/06/16  6:11 PM  Result Value Ref Range Status   MRSA by PCR NEGATIVE NEGATIVE Final    Comment:        The GeneXpert MRSA Assay (FDA approved for NASAL specimens only), is one component of a comprehensive MRSA colonization surveillance program. It is not intended to diagnose MRSA infection nor to guide or monitor treatment for MRSA infections.          Radiology Studies: Dg Chest 2 View  Result Date: 06/06/2016 CLINICAL DATA:  Dehydration, weakness, history type II diabetes mellitus EXAM: CHEST  2 VIEW COMPARISON:  11/27/2014 FINDINGS: Enlargement of cardiac silhouette with slight pulmonary vascular congestion. Atherosclerotic calcification aorta. Atelectasis versus infiltrate LEFT lower lobe. Remaining lungs clear. No pleural effusion or pneumothorax. Bones unremarkable. IMPRESSION: Atelectasis versus infiltrate in LEFT lower lobe. Enlargement of cardiac silhouette. Aortic atherosclerosis. Electronically Signed    By: Ulyses Southward M.D.   On: 06/06/2016 15:03        Scheduled Meds: . diclofenac sodium  1 application Topical BID  . feeding supplement (PRO-STAT SUGAR FREE 64)  30 mL Oral BID  . heparin  5,000 Units Subcutaneous Q8H  . insulin aspart  0-5 Units Subcutaneous QHS  . insulin aspart  0-9 Units Subcutaneous TID WC  . metoprolol  50 mg Oral BID  . polyethylene glycol  17 g Oral Daily  . Rivastigmine  1 patch Transdermal Daily   Continuous Infusions: . cefTRIAXone (ROCEPHIN)  IV    . dextrose 50 mL/hr at 06/07/16 0824     LOS: 0 days    Time spent:  Zannie Cove, MD [facility] Pager [phone]  If 7PM-7AM, please contact night-coverage [email or web address] Password [email or web address] 06/07/2016, 11:18 AM

## 2016-06-07 NOTE — Progress Notes (Signed)
Requested exelon patch from sister in law.

## 2016-06-08 ENCOUNTER — Inpatient Hospital Stay (HOSPITAL_COMMUNITY): Payer: Medicare Other

## 2016-06-08 DIAGNOSIS — E039 Hypothyroidism, unspecified: Secondary | ICD-10-CM | POA: Diagnosis present

## 2016-06-08 DIAGNOSIS — I10 Essential (primary) hypertension: Secondary | ICD-10-CM | POA: Diagnosis not present

## 2016-06-08 DIAGNOSIS — G9341 Metabolic encephalopathy: Secondary | ICD-10-CM | POA: Diagnosis present

## 2016-06-08 DIAGNOSIS — N179 Acute kidney failure, unspecified: Secondary | ICD-10-CM | POA: Diagnosis present

## 2016-06-08 DIAGNOSIS — F0390 Unspecified dementia without behavioral disturbance: Secondary | ICD-10-CM | POA: Diagnosis not present

## 2016-06-08 DIAGNOSIS — Z66 Do not resuscitate: Secondary | ICD-10-CM | POA: Diagnosis present

## 2016-06-08 DIAGNOSIS — Z6841 Body Mass Index (BMI) 40.0 and over, adult: Secondary | ICD-10-CM | POA: Diagnosis not present

## 2016-06-08 DIAGNOSIS — N390 Urinary tract infection, site not specified: Secondary | ICD-10-CM | POA: Diagnosis present

## 2016-06-08 DIAGNOSIS — E440 Moderate protein-calorie malnutrition: Secondary | ICD-10-CM | POA: Diagnosis present

## 2016-06-08 DIAGNOSIS — E669 Obesity, unspecified: Secondary | ICD-10-CM | POA: Diagnosis present

## 2016-06-08 DIAGNOSIS — E860 Dehydration: Secondary | ICD-10-CM | POA: Diagnosis present

## 2016-06-08 DIAGNOSIS — R945 Abnormal results of liver function studies: Secondary | ICD-10-CM | POA: Diagnosis present

## 2016-06-08 DIAGNOSIS — R7989 Other specified abnormal findings of blood chemistry: Secondary | ICD-10-CM

## 2016-06-08 DIAGNOSIS — Z8673 Personal history of transient ischemic attack (TIA), and cerebral infarction without residual deficits: Secondary | ICD-10-CM | POA: Diagnosis not present

## 2016-06-08 DIAGNOSIS — Z79899 Other long term (current) drug therapy: Secondary | ICD-10-CM | POA: Diagnosis not present

## 2016-06-08 DIAGNOSIS — Z7401 Bed confinement status: Secondary | ICD-10-CM | POA: Diagnosis not present

## 2016-06-08 DIAGNOSIS — I129 Hypertensive chronic kidney disease with stage 1 through stage 4 chronic kidney disease, or unspecified chronic kidney disease: Secondary | ICD-10-CM | POA: Diagnosis present

## 2016-06-08 DIAGNOSIS — E870 Hyperosmolality and hypernatremia: Secondary | ICD-10-CM | POA: Diagnosis present

## 2016-06-08 DIAGNOSIS — J9811 Atelectasis: Secondary | ICD-10-CM | POA: Diagnosis present

## 2016-06-08 DIAGNOSIS — E1122 Type 2 diabetes mellitus with diabetic chronic kidney disease: Secondary | ICD-10-CM | POA: Diagnosis present

## 2016-06-08 DIAGNOSIS — N182 Chronic kidney disease, stage 2 (mild): Secondary | ICD-10-CM | POA: Diagnosis present

## 2016-06-08 LAB — GLUCOSE, CAPILLARY
GLUCOSE-CAPILLARY: 106 mg/dL — AB (ref 65–99)
GLUCOSE-CAPILLARY: 142 mg/dL — AB (ref 65–99)
GLUCOSE-CAPILLARY: 107 mg/dL — AB (ref 65–99)
Glucose-Capillary: 90 mg/dL (ref 65–99)

## 2016-06-08 NOTE — Evaluation (Signed)
Physical Therapy Evaluation and Discharge Patient Details Name: Sylvia Medina MRN: [id number] DOB: 03-Jun-1935 Today's Date: 06/08/2016   History of Present Illness  81 y.o. F admitted for hypernatremia and failure to thrive with PMH significant for CVA, hypothyroidism, DM type 2, dementia, cardiac arrhythmia and anemia.  Clinical Impression  Pt presents to PT with decreased tolerance for functional mobility due to above diagnosis.  She required +2 max assist for bed mobility.  PTA, pt was essentially bed bound and is currently appropriate for transfers with a lift with nursing staff.  Pt reports she does not wish for PT follow up and will sign off at this time.  If needs change please reconsult.     Follow Up Recommendations SNF;Supervision/Assistance - 24 hour    Equipment Recommendations  None recommended by PT    Recommendations for Other Services       Precautions / Restrictions Precautions Precautions: None Precaution Comments: bed bound at baseline Restrictions Weight Bearing Restrictions: No      Mobility  Bed Mobility Overal bed mobility: Needs Assistance Bed Mobility: Rolling Rolling: Max assist;+2 for physical assistance         General bed mobility comments: Pt able to use handrails to roll upper trunk  Transfers                 General transfer comment: not attempted  Ambulation/Gait             General Gait Details: not attempted  Stairs            Wheelchair Mobility    Modified Rankin (Stroke Patients Only)       Balance                                             Pertinent Vitals/Pain Pain Assessment: No/denies pain    Home Living Family/patient expects to be discharged to:: Skilled nursing facility                      Prior Function Level of Independence: Needs assistance               Hand Dominance        Extremity/Trunk Assessment   Upper Extremity Assessment Upper  Extremity Assessment: Generalized weakness    Lower Extremity Assessment Lower Extremity Assessment: Generalized weakness (able to partially DF/PF ankles)       Communication   Communication: No difficulties  Cognition Arousal/Alertness: Awake/alert Behavior During Therapy: WFL for tasks assessed/performed Overall Cognitive Status: Within Functional Limits for tasks assessed                                        General Comments General comments (skin integrity, edema, etc.): RN reported that pt had skin breakdown behind knee    Exercises     Assessment/Plan    PT Assessment Patent does not need any further PT services  PT Problem List         PT Treatment Interventions      PT Goals (Current goals can be found in the Care Plan section)  Acute Rehab PT Goals Patient Stated Goal: pt did not state a goal Time For Goal Achievement: 06/22/16 Potential to Achieve Goals: Fair    Frequency  Barriers to discharge        Co-evaluation               End of Session   Activity Tolerance: Patient tolerated treatment well Patient left: in bed;with call bell/phone within reach Nurse Communication: Mobility status PT Visit Diagnosis: Adult, failure to thrive (R62.7);Muscle weakness (generalized) (M62.81)    Time: 1037-1050 PT Time Calculation (min) (ACUTE ONLY): 13 min   Charges:         PT G Codes:        Willaim Rayas SPT  Willaim Rayas 06/08/2016, 11:06 AM

## 2016-06-08 NOTE — Progress Notes (Signed)
PROGRESS NOTE    Sylvia Medina  ZOX:[id number] DOB: 1935-08-21 DOA: 06/06/2016 PCP: Oneal Grout, MD  Brief Narrative:  Sylvia Medina is a 81 y.o. female with medical history significant of CVA, dementia, diabetes, hypothyroidism, obesity,  bed bound resident of SNF, was sent to the ER due to report of progressive hypernatremia and poor oral intake.  Assessment & Plan:     Hypernatremia/mild, altered mental status:   improving, started on dextrose fluids, she appears to be still confused, unclear if this is her baseline. She thinks her daughter is in the room, there was no one in the room.  Would recommend continue with dextrose fluids for one more day and watch to see if her mental status improves.   Meanwhile I called her daughter on the phone at the number in the facesheet and could not reach her, will get in touch with the SNF to see what her baseline mental status is.   AKi on CKD2 -baseline creatinine 1.1 in 12/17 -creatinine at 1.8, not much improvement. Continue with iv fluids and monitor. US RENAL will be ordered to evaluate for obstruction.  -   Abnormal LFTS/elevated ALP on recent SNF labs -add on LFTS to am labs -no abd pain, or s/s of GB disease   Suspected UTI:  - leukocytosis persistent,  afebrile, frankly abnormal UA -continue Ceftriaxone - urine cultures show multiple bacteria. Repeat cultures ordered.   DM: -not on meds at SNF,  hba1c is 5.2 - SSI  HTN: - continue metoprolol  Dementia: intermittent confusion reported per SNF, curently appears to be confused.  , Continue Exelon  Protein calorie malnutrition:  -continue prostat, albumin only 1.9 -eval liver echogenicity on [place] can be done as outpatient.   DVT prophylaxis: Hep SQ Code Status: DNR  Family Communiction: none at bedside Disposition Plan: SNF pending improvement   Antimicrobials:   Ceftriaxone 4/23   Subjective:  reports her daughter is in the room.  Other than that , no  new complaints, no chest pain or sob.   Objective: Vitals:   06/07/16 1502 06/07/16 2056 06/08/16 0606 06/08/16 1331  BP: (!) 138/42 (!) 149/59 (!) 155/65 (!) 150/43  Pulse: 67 71 68 (!) 59  Resp: Temp: 98.3 F (36.8 C) 98.8 F (37.1 C) 99.2 F (37.3 C) 97.7 F (36.5 C)  TempSrc:    Oral  SpO2: 97% 97% 99% 100%  Weight:      Height:        Intake/Output Summary (Last 24 hours) at 06/08/16 1448 Last data filed at 06/08/16 0600  Gross per 24 hour  Intake             1430 ml  Output                0 ml  Net             1430 ml   Filed Weights   06/06/16 1756  Weight: 110.3 kg (243 lb 1.6 oz)    Examination:  General exam: elderly, obese, debilitated female, laying in bed, no distress Respiratory system: Clear to auscultation, poor air movement Cardiovascular system: S1 & S2 heard, RRR. No JVD, murmurs Gastrointestinal system: Abdomen is obese, nondistended, soft and nontender.  + bowel sounds heard. Central nervous system: Alert and oriented to self and place, No focal neurological deficits. Extremities: Very obese, deconditioned, no edema Skin: No rashes, lesions or ulcers     Data Reviewed:  CBC:  Recent Labs Lab 06/06/16 1257 06/07/16 0315  WBC 15.7* 16.8*  NEUTROABS 12.7*  --   HGB 10.1* 9.7*  HCT 33.5* 32.1*  MCV 83.8 82.9  PLT 397 395   Basic Metabolic Panel:  Recent Labs Lab 06/02/16 06/06/16 1257 06/07/16 0315  NA 157* 146* 148*  K 3.9 4.9 3.9  CL  --  116* 117*  CO2  --  21* 23  GLUCOSE  --  126* 96  BUN 44* 57* 51*  CREATININE 1.7* 1.90* 1.82*  CALCIUM  --  8.7* 8.9  MG  --  1.9  --    GFR: Estimated Creatinine Clearance: 31.5 mL/min (A) (by C-G formula based on SCr of 1.82 mg/dL (H)). Liver Function Tests:  Recent Labs Lab 06/02/16 06/07/16 0500  AST 28 36  ALT 49* 42  ALKPHOS 388* 316*  BILITOT  --  0.3  PROT  --  6.4*  ALBUMIN  --  1.9*   No results for input(s): LIPASE, AMYLASE in the last 168 hours. No  results for input(s): AMMONIA in the last 168 hours. Coagulation Profile: No results for input(s): INR, PROTIME in the last 168 hours. Cardiac Enzymes: No results for input(s): CKTOTAL, CKMB, CKMBINDEX, TROPONINI in the last 168 hours. BNP (last 3 results) No results for input(s): PROBNP in the last 8760 hours. HbA1C: No results for input(s): HGBA1C in the last 72 hours. CBG:  Recent Labs Lab 06/07/16 1231 06/07/16 1657 06/07/16 2210 06/08/16 0759 06/08/16 1233  GLUCAP 115* 103* 103* 90 106*   Lipid Profile: No results for input(s): CHOL, HDL, LDLCALC, TRIG, CHOLHDL, LDLDIRECT in the last 72 hours. Thyroid Function Tests: No results for input(s): TSH, T4TOTAL, FREET4, T3FREE, THYROIDAB in the last 72 hours. Anemia Panel: No results for input(s): VITAMINB12, FOLATE, FERRITIN, TIBC, IRON, RETICCTPCT in the last 72 hours. Urine analysis:    Component Value Date/Time   COLORURINE YELLOW 06/06/2016 1403   APPEARANCEUR CLOUDY (A) 06/06/2016 1403   LABSPEC 1.010 06/06/2016 1403   PHURINE 6.0 06/06/2016 1403   GLUCOSEU NEGATIVE 06/06/2016 1403   HGBUR LARGE (A) 06/06/2016 1403   BILIRUBINUR NEGATIVE 06/06/2016 1403   KETONESUR NEGATIVE 06/06/2016 1403   PROTEINUR 100 (A) 06/06/2016 1403   NITRITE NEGATIVE 06/06/2016 1403   LEUKOCYTESUR LARGE (A) 06/06/2016 1403   Sepsis Labs: (procalcitonin:4,lacticidven:4)  ) Recent Results (from the past 240 hour(s))  Urine culture     Status: Abnormal   Collection Time: 06/06/16  2:03 PM  Result Value Ref Range Status   Specimen Description URINE, CLEAN CATCH  Final   Special Requests NONE  Final   Culture MULTIPLE SPECIES PRESENT, SUGGEST RECOLLECTION (A)  Final   Report Status 06/07/2016 FINAL  Final  MRSA PCR Screening     Status: None   Collection Time: 06/06/16  6:11 PM  Result Value Ref Range Status   MRSA by PCR NEGATIVE NEGATIVE Final    Comment:        The GeneXpert MRSA Assay (FDA approved for NASAL  specimens only), is one component of a comprehensive MRSA colonization surveillance program. It is not intended to diagnose MRSA infection nor to guide or monitor treatment for MRSA infections.          Radiology Studies: Dg Chest 2 View  Result Date: 06/06/2016 CLINICAL DATA:  Dehydration, weakness, history type II diabetes mellitus EXAM: CHEST  2 VIEW COMPARISON:  11/27/2014 FINDINGS: Enlargement of cardiac silhouette with slight pulmonary vascular congestion. Atherosclerotic calcification aorta. Atelectasis versus  infiltrate LEFT lower lobe. Remaining lungs clear. No pleural effusion or pneumothorax. Bones unremarkable. IMPRESSION: Atelectasis versus infiltrate in LEFT lower lobe. Enlargement of cardiac silhouette. Aortic atherosclerosis. Electronically Signed   By: Ulyses Southward M.D.   On: 06/06/2016 15:03        Scheduled Meds: . diclofenac sodium  1 application Topical BID  . feeding supplement (PRO-STAT SUGAR FREE 64)  30 mL Oral BID  . heparin  5,000 Units Subcutaneous Q8H  . insulin aspart  0-5 Units Subcutaneous QHS  . insulin aspart  0-9 Units Subcutaneous TID WC  . metoprolol  50 mg Oral BID  . polyethylene glycol  17 g Oral Daily  . Rivastigmine  1 patch Transdermal Q24H   Continuous Infusions: . cefTRIAXone (ROCEPHIN)  IV 1 g (06/08/16 1418)  . dextrose 50 mL/hr at 06/08/16 0708     LOS: 0 days    Time spent:    Kathlen Mody, MD [facility] Pager [phone]  If 7PM-7AM, please contact night-coverage [email or web address] Password [facility] 06/08/2016, 2:48 PM

## 2016-06-09 LAB — BASIC METABOLIC PANEL
ANION GAP: 7 (ref 5–15)
BUN: 37 mg/dL — ABNORMAL HIGH (ref 6–20)
CALCIUM: 8.7 mg/dL — AB (ref 8.9–10.3)
CO2: 22 mmol/L (ref 22–32)
Chloride: 113 mmol/L — ABNORMAL HIGH (ref 101–111)
Creatinine, Ser: 1.42 mg/dL — ABNORMAL HIGH (ref 0.44–1.00)
GFR calc non Af Amer: 34 mL/min — ABNORMAL LOW (ref >60)
GFR, EST AFRICAN AMERICAN: 40 mL/min — AB (ref >60)
Glucose, Bld: 102 mg/dL — ABNORMAL HIGH (ref 65–99)
Potassium: 3.6 mmol/L (ref 3.5–5.1)
Sodium: 142 mmol/L (ref 135–145)

## 2016-06-09 LAB — GLUCOSE, CAPILLARY
GLUCOSE-CAPILLARY: 99 mg/dL (ref 65–99)
GLUCOSE-CAPILLARY: 99 mg/dL (ref 65–99)

## 2016-06-09 MED ORDER — CEPHALEXIN 500 MG PO CAPS: 500.0000 mg | ORAL_CAPSULE | Freq: Two times a day (BID) | ORAL | 0 refills | Status: AC

## 2016-06-09 NOTE — Discharge Summary (Addendum)
Physician Discharge Summary  Sylvia Medina NFA:[id number] DOB: 1935-12-06 DOA: 06/06/2016  PCP: Oneal Grout, MD  Admit date: 06/06/2016 Discharge date: 06/09/2016  Admitted From:snf Disposition:  SNF  Recommendations for Outpatient Follow-up:  1. Follow up with PCP in 1-2 weeks 2. Please obtain BMP/CBC in one week     Discharge Condition:STABLE. CODE STATUS:DNR Diet recommendation: Heart Healthy  Brief/Interim Summary: Sylvia Medina a 81 y.o.femalewith medical history significant of CVA, dementia, diabetes, hypothyroidism, obesity,  bed bound resident of SNF, was sent to the ER due to report of progressive hypernatremia and poor oral intake.  Discharge Diagnoses:  Active Problems:   Diabetes mellitus type 2, diet-controlled (HCC)   Dementia without behavioral disturbance   Essential hypertension, benign   Protein-calorie malnutrition (HCC)   Hypernatremia   AKI (acute kidney injury) (HCC)   Acute lower UTI  Hypernatremia/ acute encephalopathy , ,metabolic in nature:  Started on IV dextrose fluids and encouraged to take by mouth.  Repeat sodium has improved to 142.  D/c back to snf today.    Acute kidney injury on CKD stage 2;  Creatinine back to baseline after IV fluids.  US RENAL neg for hydronephrosis.  Encourage po intake at the facility.    Suspected UTI:  Cultures show multiple bacteria.  Received 4 doses of rocephin , keflex to complete the course.    Diet controlled DM:  CBG (last 3)   Recent Labs  06/08/16 1618 06/08/16 2243 06/09/16 0752  GLUCAP 142* 107* 99    Stage cbgs.   Hypertension: resume home meds on discharge.   Dementia:  She appears to be back to baseline today.  Resume rivastigmine patch.    Protein energy malnutrition:moderate.  Nutritional supplementation recommended on discharge, along with assist during meals and encourage free fluid intake.       Discharge Instructions  Discharge Instructions    Diet  - low sodium heart healthy    Complete by:  As directed    Discharge instructions    Complete by:  As directed    Please follow up with PCP in one week.     Allergies as of 06/09/2016   No Known Allergies     Medication List    TAKE these medications   cephALEXin 500 MG capsule Commonly known as:  KEFLEX Take 1 capsule (500 mg total) by mouth 2 (two) times daily.   Cholecalciferol 1000 units tablet Take 1 tablet (1,000 Units total) by mouth daily.   diclofenac sodium 1 % Gel Commonly known as:  VOLTAREN Apply 1 application topically 2 (two) times daily.   feeding supplement (PRO-STAT SUGAR FREE 64) Liqd Take 30 mLs by mouth 2 (two) times daily.   ferrous sulfate 325 (65 FE) MG tablet Take 325 mg by mouth daily with breakfast.   metoprolol 50 MG tablet Commonly known as:  LOPRESSOR Take 50 mg by mouth 2 (two) times daily.   multivitamin tablet Take 1 tablet by mouth daily.   polyethylene glycol powder powder Commonly known as:  GLYCOLAX/MIRALAX Take 17 g by mouth daily. Mix in 4-8 ounces of Orange Juice and take by mouth daily for constipation.   Rivastigmine 13.3 MG/24HR Pt24 Commonly known as:  EXELON Place 1 patch (13.3 mg total) onto the skin daily.   UNABLE TO FIND Med Name: Med pass 2.0 take 60 mL by mouth 2 times daily      Follow-up Information    Oneal Grout, MD. Schedule an appointment as soon as  possible for a visit in 1 week(s).   Specialty:  Internal Medicine Contact information: [address] [place] [address] [phone]          No Known Allergies  Consultations:  None.    Procedures/Studies: Dg Chest 2 View  Result Date: 06/06/2016 CLINICAL DATA:  Dehydration, weakness, history type II diabetes mellitus EXAM: CHEST  2 VIEW COMPARISON:  11/27/2014 FINDINGS: Enlargement of cardiac silhouette with slight pulmonary vascular congestion. Atherosclerotic calcification aorta. Atelectasis versus infiltrate LEFT lower lobe.  Remaining lungs clear. No pleural effusion or pneumothorax. Bones unremarkable. IMPRESSION: Atelectasis versus infiltrate in LEFT lower lobe. Enlargement of cardiac silhouette. Aortic atherosclerosis. Electronically Signed   By: Ulyses Southward M.D.   On: 06/06/2016 15:03   US Renal  Result Date: 06/09/2016 CLINICAL DATA:  Initial evaluation for acute renal failure. EXAM: RENAL / URINARY TRACT ULTRASOUND COMPLETE COMPARISON:  None available. FINDINGS: Right Kidney: Length: 9.1 cm. Echogenicity within normal limits. No mass or hydronephrosis visualized. Cortical thinning noted at the upper pole. Left Kidney: Length: 9.4 cm. Echogenicity within normal limits. No mass or hydronephrosis visualized. Bladder: Appears normal for degree of bladder distention. IMPRESSION: 1. Cortical thinning at the upper pole of the right kidney. 2. Otherwise negative renal ultrasound.  No hydronephrosis. Electronically Signed   By: Rise Mu M.D.   On: 06/09/2016 00:19       Subjective: She is awake and alert and knows that she is in the hospital.  No new complaints.   Discharge Exam: Vitals:   06/09/16 0536 06/09/16 0600  BP: (!) 179/59 (!) 154/49  Pulse: 64   Resp: 17   Temp: 98.2 F (36.8 C)    Vitals:   06/08/16 1331 06/08/16 2231 06/09/16 0536 06/09/16 0600  BP: (!) 150/43 (!) 157/44 (!) 179/59 (!) 154/49  Pulse: (!) 59 68 64   Resp: Temp: 97.7 F (36.5 C) 98.5 F (36.9 C) 98.2 F (36.8 C)   TempSrc: Oral Oral Oral   SpO2: 100% 100% 100%   Weight:      Height:        General: Pt is alert, awake, not in acute distress Cardiovascular: RRR, S1/S2 +, no rubs, no gallops Respiratory: CTA bilaterally, no wheezing, no rhonchi Abdominal: Soft, NT, ND, bowel sounds + Extremities: no edema, no cyanosis    The results of significant diagnostics from this hospitalization (including imaging, microbiology, ancillary and laboratory) are listed below for reference.      Microbiology: Recent Results (from the past 240 hour(s))  Urine culture     Status: Abnormal   Collection Time: 06/06/16  2:03 PM  Result Value Ref Range Status   Specimen Description URINE, CLEAN CATCH  Final   Special Requests NONE  Final   Culture MULTIPLE SPECIES PRESENT, SUGGEST RECOLLECTION (A)  Final   Report Status 06/07/2016 FINAL  Final  MRSA PCR Screening     Status: None   Collection Time: 06/06/16  6:11 PM  Result Value Ref Range Status   MRSA by PCR NEGATIVE NEGATIVE Final    Comment:        The GeneXpert MRSA Assay (FDA approved for NASAL specimens only), is one component of a comprehensive MRSA colonization surveillance program. It is not intended to diagnose MRSA infection nor to guide or monitor treatment for MRSA infections.      Labs: BNP (last 3 results) No results for input(s): BNP in the last 8760 hours. Basic Metabolic Panel:  Recent Labs Lab 06/06/16 1257 06/07/16 0315 06/09/16 0847  NA 146* 148* 142  K 4.9 3.9 3.6  CL 116* 117* 113*  CO2 21* 23 22  GLUCOSE 126* 96 102*  BUN 57* 51* 37*  CREATININE 1.90* 1.82* 1.42*  CALCIUM 8.7* 8.9 8.7*  MG 1.9  --   --    Liver Function Tests:  Recent Labs Lab 06/07/16 0500  AST 36  ALT 42  ALKPHOS 316*  BILITOT 0.3  PROT 6.4*  ALBUMIN 1.9*   No results for input(s): LIPASE, AMYLASE in the last 168 hours. No results for input(s): AMMONIA in the last 168 hours. CBC:  Recent Labs Lab 06/06/16 1257 06/07/16 0315  WBC 15.7* 16.8*  NEUTROABS 12.7*  --   HGB 10.1* 9.7*  HCT 33.5* 32.1*  MCV 83.8 82.9  PLT 397 395   Cardiac Enzymes: No results for input(s): CKTOTAL, CKMB, CKMBINDEX, TROPONINI in the last 168 hours. BNP: Invalid input(s): POCBNP CBG:  Recent Labs Lab 06/08/16 0759 06/08/16 1233 06/08/16 1618 06/08/16 2243 06/09/16 0752  GLUCAP 90 106* 142* 107* 99   D-Dimer No results for input(s): DDIMER in the last 72 hours. Hgb A1c No results for input(s): HGBA1C  in the last 72 hours. Lipid Profile No results for input(s): CHOL, HDL, LDLCALC, TRIG, CHOLHDL, LDLDIRECT in the last 72 hours. Thyroid function studies No results for input(s): TSH, T4TOTAL, T3FREE, THYROIDAB in the last 72 hours.  Invalid input(s): FREET3 Anemia work up No results for input(s): VITAMINB12, FOLATE, FERRITIN, TIBC, IRON, RETICCTPCT in the last 72 hours. Urinalysis    Component Value Date/Time   COLORURINE YELLOW 06/06/2016 1403   APPEARANCEUR CLOUDY (A) 06/06/2016 1403   LABSPEC 1.010 06/06/2016 1403   PHURINE 6.0 06/06/2016 1403   GLUCOSEU NEGATIVE 06/06/2016 1403   HGBUR LARGE (A) 06/06/2016 1403   BILIRUBINUR NEGATIVE 06/06/2016 1403   KETONESUR NEGATIVE 06/06/2016 1403   PROTEINUR 100 (A) 06/06/2016 1403   NITRITE NEGATIVE 06/06/2016 1403   LEUKOCYTESUR LARGE (A) 06/06/2016 1403   Sepsis Labs Invalid input(s): PROCALCITONIN,  WBC,  LACTICIDVEN Microbiology Recent Results (from the past 240 hour(s))  Urine culture     Status: Abnormal   Collection Time: 06/06/16  2:03 PM  Result Value Ref Range Status   Specimen Description URINE, CLEAN CATCH  Final   Special Requests NONE  Final   Culture MULTIPLE SPECIES PRESENT, SUGGEST RECOLLECTION (A)  Final   Report Status 06/07/2016 FINAL  Final  MRSA PCR Screening     Status: None   Collection Time: 06/06/16  6:11 PM  Result Value Ref Range Status   MRSA by PCR NEGATIVE NEGATIVE Final    Comment:        The GeneXpert MRSA Assay (FDA approved for NASAL specimens only), is one component of a comprehensive MRSA colonization surveillance program. It is not intended to diagnose MRSA infection nor to guide or monitor treatment for MRSA infections.      Time coordinating discharge: Over 30 minutes  SIGNED:   Kathlen Mody, MD  [facility] 06/09/2016, 10:26 AM Pager   If 7PM-7AM, please contact night-coverage [email or web address] Password [email or web address]

## 2016-06-09 NOTE — NC FL2 (Signed)
[place] MEDICAID FL2 LEVEL OF CARE SCREENING TOOL     IDENTIFICATION  Patient Name: Sylvia Medina Birthdate: Feb 23, 1935 Sex: female Admission Date (Current Location): 06/06/2016  [facility] and [place] Number:  Producer, television/film/video and Address:  [facility], [address] [place], [place] [address]      Provider Number: [id number]  Attending Physician Name and Address:  Kathlen Mody, MD  Relative Name and Phone Number:  Hillary Bow daughter, [phone]    Current Level of Care: Hospital Recommended Level of Care: Skilled Nursing Facility Prior Approval Number:    Date Approved/Denied:   PASRR Number:    Discharge Plan: SNF    Current Diagnoses: Patient Active Problem List   Diagnosis Date Noted  . AKI (acute kidney injury) (HCC) 06/06/2016  . Acute lower UTI 06/06/2016  . Acute cystitis with hematuria   . Hypernatremia 08/31/2015  . Chronic constipation 07/27/2015  . Protein-calorie malnutrition (HCC) 07/27/2015  . Alzheimer's disease 09/18/2014  . Hyperlipidemia 09/18/2014  . Anemia of chronic disease 09/18/2014  . Constipation 11/17/2013  . Essential hypertension, benign 10/07/2013  . Thyroiditis, subacute 04/14/2013  . Dementia without behavioral disturbance 02/10/2013  . Vitamin D deficiency 07/04/2012  . Depression 07/04/2012  . Diabetes mellitus type 2, diet-controlled (HCC) 07/04/2012  . CVA (cerebral vascular accident) (HCC) 07/04/2012  . Cardiac arrhythmia 07/04/2012    Orientation RESPIRATION BLADDER Height & Weight     Self, Situation, Place  Normal Incontinent Weight: 110.3 kg (243 lb 1.6 oz) Height:   (165.1 cm)  BEHAVIORAL SYMPTOMS/MOOD NEUROLOGICAL BOWEL NUTRITION STATUS      Incontinent Diet (Please see DC Summary)  AMBULATORY STATUS COMMUNICATION OF NEEDS Skin   Extensive Assist Verbally Normal                       Personal Care Assistance Level of Assistance  Bathing, Feeding, Dressing Bathing  Assistance: Maximum assistance Feeding assistance: Limited assistance Dressing Assistance: Limited assistance     Functional Limitations Info             SPECIAL CARE FACTORS FREQUENCY  PT (By licensed PT)     PT Frequency: 5x/week              Contractures      Additional Factors Info  Code Status, Allergies, Isolation Precautions Code Status Info: DNR Allergies Info: NKA     Isolation Precautions Info: VRE     Current Medications (06/09/2016):  This is the current hospital active medication list Current Facility-Administered Medications  Medication Dose Route Frequency Provider Last Rate Last Dose  . acetaminophen (TYLENOL) tablet 650 mg  650 mg Oral Q6H PRN Ozella Rocks, MD       Or  . acetaminophen (TYLENOL) suppository 650 mg  650 mg Rectal Q6H PRN Ozella Rocks, MD      . cefTRIAXone (ROCEPHIN) 1 g in dextrose 5 % 50 mL IVPB  1 g Intravenous Q24H Ozella Rocks, MD 100 mL/hr at 06/08/16 1418 1 g at 06/08/16 1418  . dextrose 5 % solution   Intravenous Continuous Kathlen Mody, MD 75 mL/hr at 06/09/16 0041    . diclofenac sodium (VOLTAREN) 1 % transdermal gel 1 application  1 application Topical BID Ozella Rocks, MD   1 application at 06/08/16 1027  . feeding supplement (PRO-STAT SUGAR FREE 64) liquid 30 mL  30 mL Oral BID Ozella Rocks, MD   30 mL at  06/08/16 2237  . heparin injection 5,000 Units  5,000 Units Subcutaneous Q8H Ozella Rocks, MD   5,000 Units at 06/09/16 [phone]  . hydrALAZINE (APRESOLINE) injection 5-10 mg  5-10 mg Intravenous Q4H PRN Ozella Rocks, MD      . insulin aspart (novoLOG) injection 0-5 Units  0-5 Units Subcutaneous QHS Ozella Rocks, MD      . insulin aspart (novoLOG) injection 0-9 Units  0-9 Units Subcutaneous TID WC Ozella Rocks, MD   1 Units at 06/08/16 1716  . metoprolol tartrate (LOPRESSOR) tablet 50 mg  50 mg Oral BID Ozella Rocks, MD   50 mg at 06/08/16 2236  . ondansetron (ZOFRAN) tablet 4 mg  4 mg Oral Q6H PRN  Ozella Rocks, MD       Or  . ondansetron [facility]) injection 4 mg  4 mg Intravenous Q6H PRN Ozella Rocks, MD      . oxyCODONE (Oxy IR/ROXICODONE) immediate release tablet 5 mg  5 mg Oral Q4H PRN Ozella Rocks, MD      . polyethylene glycol (MIRALAX / GLYCOLAX) packet 17 g  17 g Oral Daily Ozella Rocks, MD   17 g at 06/07/16 0915  . Rivastigmine PT24 13.3 mg  1 patch Transdermal Q24H Ozella Rocks, MD   13.3 mg at 06/08/16 2259     Discharge Medications: Please see discharge summary for a list of discharge medications.  Relevant Imaging Results:  Relevant Lab Results:   Additional Information SSN: [id number]  Renne Crigler [place], [place]

## 2016-06-09 NOTE — Care Management Note (Signed)
Case Management Note  Patient Details  Name: Sylvia Medina MRN: [id number] Date of Birth: 14-Oct-1935  Subjective/Objective:   Pt presented for Hypernatremia and poor oral intake. 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 CSW is assisting with disposition needs.                   Action/Plan: No further needs from CM at this time.   Expected Discharge Date:  06/09/16               Expected Discharge Plan:  Skilled Nursing Facility  In-House Referral:  Clinical Social Work  Discharge planning Services  CM Consult  Post Acute Care Choice:  NA Choice offered to:  NA  DME Arranged:  N/A DME Agency:  NA  HH Arranged:  NA HH Agency:  NA  Status of Service:  Completed, signed off  If discussed at [employer] of Stay Meetings, dates discussed:    Additional Comments:  Gala Lewandowsky, RN 06/09/2016, 10:53 AM

## 2016-06-09 NOTE — Progress Notes (Signed)
Report called to [facility] to [facility]. Pt transported via [facility].

## 2016-06-09 NOTE — Progress Notes (Addendum)
Patient will DC to: [facility]  Anticipated DC date: 06/09/16 Family notified: Left vm for daughter Transport by: Sharin Mons   Per MD patient ready for DC to [facility]. RN, patient, patient's family, and facility notified of DC. Discharge Summary sent to facility. RN given number for report [phone]). DC packet on chart. Ambulance transport requested for patient.   CSW signing off.  Cristobal Goldmann, [place] Clinical Social Worker [phone]

## 2016-06-09 NOTE — Clinical Social Work Note (Signed)
Clinical Social Work Assessment  Patient Details  Name: Sylvia Medina MRN: [id number] Date of Birth: [birth year]  Date of referral:  06/09/16               Reason for consult:  Discharge Planning                Permission sought to share information with:  Facility Medical sales representative, Family Supports Permission granted to share information::  Yes, Verbal Permission Granted  Name::     Water quality scientist::  [employer]  Relationship::  Daughter  Contact Information:     Housing/Transportation Living arrangements for the past 2 months:  Skilled Building surveyor of Information:  Adult Children, Patient, Facility Patient Interpreter Needed:  None Criminal Activity/Legal Involvement Pertinent to Current Situation/Hospitalization:  No - Comment as needed Significant Relationships:  Adult Children Lives with:  Facility Resident Do you feel safe going back to the place where you live?  Yes Need for family participation in patient care:  No (Coment)  Care giving concerns:  CSW received consult regarding discharge planning. Patient resides at [facility] and will return at discharge. SW to continue to follow and assist with discharge planning needs.   Social Worker assessment / plan:  CSW spoke with patient and patient's daughter regarding discharge plan.  Employment status:  Retired Database administrator PT Recommendations:  Skilled Nursing Facility Information / Referral to community resources:  Skilled Nursing Facility  Patient/Family's Response to care:  Patient and patient's daughter reported agreement with discharging back to [employer] and appreciate the hospital's care.  Patient/Family's Understanding of and Emotional Response to Diagnosis, Current Treatment, and Prognosis:  Patient/family is realistic regarding therapy needs and expressed being hopeful for return to SNF placement. Patient expressed understanding of CSW role and discharge process as  well as her medical condition. No questions/concerns about plan or treatment.    Emotional Assessment Appearance:  Appears stated age Attitude/Demeanor/Rapport:  Other (Appropriate) Affect (typically observed):  Accepting, Appropriate Orientation:  Oriented to Self, Oriented to Situation, Oriented to Place Alcohol / Substance use:  Not Applicable Psych involvement (Current and /or in the community):  No (Comment)  Discharge Needs  Concerns to be addressed:  Care Coordination Readmission within the last 30 days:  No Current discharge risk:  None Barriers to Discharge:  No Barriers Identified   Mearl Latin, LCSWA 06/09/2016, 11:11 AM

## 2016-06-10 ENCOUNTER — Telehealth: Payer: Self-pay

## 2016-06-10 NOTE — Telephone Encounter (Signed)
This is a patient of [facility], who was admitted to [facility] after hospitalization. [facility] F/U is needed. Hospital discharge from [facility] on 06/09/2016

## 2016-06-13 ENCOUNTER — Non-Acute Institutional Stay (SKILLED_NURSING_FACILITY): Payer: Medicare Other | Admitting: Internal Medicine

## 2016-06-13 ENCOUNTER — Encounter: Payer: Self-pay | Admitting: Internal Medicine

## 2016-06-13 DIAGNOSIS — D638 Anemia in other chronic diseases classified elsewhere: Secondary | ICD-10-CM

## 2016-06-13 DIAGNOSIS — I10 Essential (primary) hypertension: Secondary | ICD-10-CM

## 2016-06-13 DIAGNOSIS — N182 Chronic kidney disease, stage 2 (mild): Secondary | ICD-10-CM

## 2016-06-13 DIAGNOSIS — G300 Alzheimer's disease with early onset: Secondary | ICD-10-CM | POA: Diagnosis not present

## 2016-06-13 DIAGNOSIS — F0280 Dementia in other diseases classified elsewhere without behavioral disturbance: Secondary | ICD-10-CM

## 2016-06-13 DIAGNOSIS — R531 Weakness: Secondary | ICD-10-CM | POA: Diagnosis not present

## 2016-06-13 DIAGNOSIS — D72829 Elevated white blood cell count, unspecified: Secondary | ICD-10-CM | POA: Diagnosis not present

## 2016-06-13 DIAGNOSIS — N3000 Acute cystitis without hematuria: Secondary | ICD-10-CM

## 2016-06-13 DIAGNOSIS — E871 Hypo-osmolality and hyponatremia: Secondary | ICD-10-CM | POA: Diagnosis not present

## 2016-06-13 NOTE — Progress Notes (Signed)
LOCATION: [facility]  PCP: Oneal Grout, MD   Code Status: DNR  Goals of care: Advanced Directive information Advanced Directives 06/13/2016  Does Patient Have a Medical Advance Directive? Yes  Type of Advance Directive Out of facility DNR (pink MOST or yellow form)  Does patient want to make changes to medical advance directive? No - Patient declined  Copy of Healthcare Power of Attorney in Chart? -  Would patient like information on creating a medical advance directive? -  Pre-existing out of facility DNR order (yellow form or pink MOST form) Yellow form placed in chart (order not valid for inpatient use)       Extended Emergency Contact Information Primary Emergency Contact: Adron Bene Address: [address]          [place] [address] [place] Phone: [phone] Relation: None   No Known Allergies  Chief Complaint  Patient presents with  . Readmit To SNF    Readmission     HPI:  Patient is a 81 y.o. female seen today for long term care post hospital re-admission from 06/06/16-06/09/16 with hyponatremia and change of mental state along with AKI on ckd 2. She received iv fluids and her sodium level, renal function improved. She was started on antibiotic for UTI. She is seen in her room today. She has chronic hyponatremia. ckd stage 2, dementia, type 2 DM among others.   Review of Systems:  Constitutional: Negative for fever, chills. HENT: Negative for headache, congestion, nasal discharge, difficulty swallowing.   Eyes: Negative for blurred vision, double vision Respiratory: Negative for cough, shortness of breath Cardiovascular: Negative for chest pain, palpitations. Positive for leg swelling.  Gastrointestinal: Negative for heartburn, nausea, vomiting, abdominal pain, loss of appetite, melena, diarrhea and constipation.  Genitourinary: Negative for dysuria.  Musculoskeletal: Negative for back pain, fall.  Skin: Negative for  itching, rash.  Neurological: Negative for dizziness. Psychiatric/Behavioral: Negative for depression   Past Medical History:  Diagnosis Date  . Anemia 07/04/2012  . Cardiac arrhythmia 07/04/2012  . CVA (cerebral vascular accident) (HCC) 07/04/2012  . Dementia 07/04/2012  . Diabetes mellitus type 2, diet-controlled (HCC) 07/04/2012  . Unspecified constipation 07/04/2012  . Unspecified hypothyroidism 07/04/2012  . Unspecified vitamin D deficiency 07/04/2012   History reviewed. No pertinent surgical history. Social History:   reports that she has never smoked. She has never used smokeless tobacco. She reports that she does not drink alcohol or use drugs.  History reviewed. No pertinent family history.  Medications: Allergies as of 06/13/2016   No Known Allergies     Medication List       Accurate as of 06/13/16  3:23 PM. Always use your most recent med list.          cephALEXin 500 MG capsule Commonly known as:  KEFLEX Take 500 mg by mouth 2 (two) times daily.   Cholecalciferol 1000 units tablet Take 1 tablet (1,000 Units total) by mouth daily.   diclofenac sodium 1 % Gel Commonly known as:  VOLTAREN Apply 1 application topically 2 (two) times daily.   feeding supplement (PRO-STAT SUGAR FREE 64) Liqd Take 30 mLs by mouth 2 (two) times daily.   ferrous sulfate 325 (65 FE) MG tablet Take 325 mg by mouth daily with breakfast.   metoprolol 50 MG tablet Commonly known as:  LOPRESSOR Take 50 mg by mouth 2 (two) times daily.   multivitamin tablet Take 1 tablet by mouth daily.   polyethylene glycol powder powder Commonly  known as:  GLYCOLAX/MIRALAX Take 17 g by mouth daily. Mix in 4-8 ounces of Orange Juice and take by mouth daily for constipation.   Rivastigmine 13.3 MG/24HR Pt24 Commonly known as:  EXELON Place 1 patch (13.3 mg total) onto the skin daily.   UNABLE TO FIND Med Name: Med pass 2.0 take 60 mL by mouth 2 times daily       Immunizations: Immunization  History  Administered Date(s) Administered  . Influenza-Unspecified 11/29/2012, 11/21/2013, 12/12/2014, 03/09/2016  . PPD Test 10/27/2012, 11/01/2013, 10/31/2014  . Pneumococcal-Unspecified 02/10/2010     Physical Exam: Vitals:   06/13/16 1435  BP: (!) 159/72  Pulse: 66  Resp: 16  Temp: 97.4 F (36.3 C)  TempSrc: Oral  Weight: 243 lb 1.6 oz (110.3 kg)  Height:  (1.651 m)   Body mass index is 40.45 kg/m.  General- elderly female, morbidly obese, in no acute distress Head- normocephalic, atraumatic Nose- no maxillary or frontal sinus tenderness, no nasal discharge Throat- moist mucus membrane, normal oropharynx, edentulous Eyes- PERRLA, EOMI, no pallor, no icterus, no discharge, normal conjunctiva, normal sclera Neck- no cervical lymphadenopathy Cardiovascular- normal s1,s2, no murmur Respiratory- bilateral clear to auscultation, no wheeze, no rhonchi, no crackles, no use of accessory muscles Abdomen- bowel sounds present, soft, non tender, no guarding or rigidity Musculoskeletal- able to move all 4 extremities, chronic lymphedema Neurological- alert and oriented to self and place only Skin- warm and dry Psychiatry- normal mood and affect    Labs reviewed: Basic Metabolic Panel:  Recent Labs  16/10/96 1257 06/07/16 0315 06/09/16 0847  NA 146* 148* 142  K 4.9 3.9 3.6  CL 116* 117* 113*  CO2 21* 23 22  GLUCOSE 126* 96 102*  BUN 57* 51* 37*  CREATININE 1.90* 1.82* 1.42*  CALCIUM 8.7* 8.9 8.7*  MG 1.9  --   --    Liver Function Tests:  Recent Labs  05/30/16 06/02/16 06/07/16 0500  AST 66* 28 36  ALT 97* 49* 42  ALKPHOS 414* 388* 316*  BILITOT  --   --  0.3  PROT  --   --  6.4*  ALBUMIN  --   --  1.9*   No results for input(s): LIPASE, AMYLASE in the last 8760 hours. No results for input(s): AMMONIA in the last 8760 hours. CBC:  Recent Labs  05/25/16 06/06/16 1257 06/07/16 0315  WBC 10.4 15.7* 16.8*  NEUTROABS  --  12.7*  --   HGB 10.7*  10.1* 9.7*  HCT 35* 33.5* 32.1*  MCV  --  83.8 82.9  PLT 320 397 395   Cardiac Enzymes: No results for input(s): CKTOTAL, CKMB, CKMBINDEX, TROPONINI in the last 8760 hours. BNP: Invalid input(s): POCBNP CBG:  Recent Labs  06/08/16 2243 06/09/16 0752 06/09/16 1147  GLUCAP 107* 99 99    Radiological Exams: Dg Chest 2 View  Result Date: 06/06/2016 CLINICAL DATA:  Dehydration, weakness, history type II diabetes mellitus EXAM: CHEST  2 VIEW COMPARISON:  11/27/2014 FINDINGS: Enlargement of cardiac silhouette with slight pulmonary vascular congestion. Atherosclerotic calcification aorta. Atelectasis versus infiltrate LEFT lower lobe. Remaining lungs clear. No pleural effusion or pneumothorax. Bones unremarkable. IMPRESSION: Atelectasis versus infiltrate in LEFT lower lobe. Enlargement of cardiac silhouette. Aortic atherosclerosis. Electronically Signed   By: Ulyses Southward M.D.   On: 06/06/2016 15:03   US Renal  Result Date: 06/09/2016 CLINICAL DATA:  Initial evaluation for acute renal failure. EXAM: RENAL / URINARY TRACT ULTRASOUND COMPLETE COMPARISON:  None available. FINDINGS: Right  Kidney: Length: 9.1 cm. Echogenicity within normal limits. No mass or hydronephrosis visualized. Cortical thinning noted at the upper pole. Left Kidney: Length: 9.4 cm. Echogenicity within normal limits. No mass or hydronephrosis visualized. Bladder: Appears normal for degree of bladder distention. IMPRESSION: 1. Cortical thinning at the upper pole of the right kidney. 2. Otherwise negative renal ultrasound.  No hydronephrosis. Electronically Signed   By: Rise Mu M.D.   On: 06/09/2016 00:19    Assessment/Plan  Generalized weakness From deconditioning. Will have patient work with PT/OT as tolerated to regain strength and restore function.  Fall precautions are in place.  Urinary tract infection Continue and complete kelfex 500 mg bid on 06/19/16. Encourage hydration. Provide perineal  hygiene  Hyponatremia s/p iv fluids in hospital. Monitor BMP  Leukocytosis Afebrile, on antibiotic for UTI at present, on exam no clinical signs of infection, check cbc with diff  ckd stage 2 With recent acute on chronic renal impairment, s/p iv fluids, check bmp  HTN Continue lopressor 50 mg bid and monitor BP  Anemia of chronic disease Continue feso4 supplement, check cbc  alzhemier's disease Continue exelon patch, provide supportive care   Goals of care: long term care   Labs/tests ordered: cbc, cmp 06/14/16  Family/ staff Communication: reviewed care plan with patient and nursing supervisor    Oneal Grout, MD Internal Medicine [facility] [address] [place], [place] [address] Cell Phone (Monday-Friday 8 am - 5 pm): [phone] On Call: [phone] and follow prompts after 5 pm and on weekends Office Phone: [phone] Office Fax: [phone]

## 2016-06-14 LAB — BASIC METABOLIC PANEL
BUN: 48 mg/dL — AB (ref 4–21)
Creatinine: 1.3 mg/dL — AB (ref 0.5–1.1)
GLUCOSE: 84 mg/dL
Potassium: 4.1 mmol/L (ref 3.4–5.3)
Sodium: 149 mmol/L — AB (ref 137–147)

## 2016-06-14 LAB — CBC AND DIFFERENTIAL
HEMATOCRIT: 32 % — AB (ref 36–46)
Hemoglobin: 9.3 g/dL — AB (ref 12.0–16.0)
PLATELETS: 402 10*3/uL — AB (ref 150–399)
WBC: 7.3 10^3/mL

## 2016-06-14 LAB — HEPATIC FUNCTION PANEL
ALT: 20 U/L (ref 7–35)
AST: 16 U/L (ref 13–35)
Alkaline Phosphatase: 239 U/L — AB (ref 25–125)
BILIRUBIN, TOTAL: 0.3 mg/dL

## 2016-06-15 DIAGNOSIS — N184 Chronic kidney disease, stage 4 (severe): Secondary | ICD-10-CM | POA: Insufficient documentation

## 2016-07-04 ENCOUNTER — Non-Acute Institutional Stay (SKILLED_NURSING_FACILITY): Payer: Medicare Other | Admitting: Internal Medicine

## 2016-07-04 ENCOUNTER — Encounter: Payer: Self-pay | Admitting: Internal Medicine

## 2016-07-04 DIAGNOSIS — I10 Essential (primary) hypertension: Secondary | ICD-10-CM | POA: Diagnosis not present

## 2016-07-04 DIAGNOSIS — E870 Hyperosmolality and hypernatremia: Secondary | ICD-10-CM

## 2016-07-04 NOTE — Progress Notes (Signed)
[facility]  PCP: Oneal GroutPandey, Daliana Leverett, MD   Code Status: DNR  Goals of care: Advanced Directive information Advanced Directives 06/13/2016  Does Patient Have a Medical Advance Directive? Yes  Type of Advance Directive Out of facility DNR (pink MOST or yellow form)  Does patient want to make changes to medical advance directive? No - Patient declined  Copy of Healthcare Power of Attorney in Chart? -  Would patient like information on creating a medical advance directive? -  Pre-existing out of facility DNR order (yellow form or pink MOST form) Yellow form placed in chart (order not valid for inpatient use)       Extended Emergency Contact Information Primary Emergency Contact: Adron BeneGoolsby,Leona Address: [address]          [place] [id number] [place] Mobile Phone: [phone] Relation: None   No Known Allergies  Chief Complaint  Patient presents with  . Acute Visit    Elevated blood pressure      HPI:  Patient is a 81 y.o. female seen today for acute visit. Per staff her blood pressure readings have been elevated. She is seen in her room today. She denies any concerns.  Review of Systems:  Constitutional: Negative for fever, chills. HENT: Negative for headache Eyes: Negative for double vision Respiratory: Negative for cough, shortness of breath Cardiovascular: Negative for chest pain, palpitations. Positive for leg swelling.  Gastrointestinal: Negative for heartburn, nausea, vomiting, abdominal pain, loss of appetite Genitourinary: Negative for dysuria.  Musculoskeletal: Negative for pain. Skin: Negative for itching, rash.  Neurological: Negative for dizziness. Psychiatric/Behavioral: Negative for depression   Past Medical History:  Diagnosis Date  . Anemia 07/04/2012  . Cardiac arrhythmia 07/04/2012  . CVA (cerebral vascular accident) (HCC) 07/04/2012  . Dementia 07/04/2012  . Diabetes mellitus type 2, diet-controlled (HCC)  07/04/2012  . Unspecified constipation 07/04/2012  . Unspecified hypothyroidism 07/04/2012  . Unspecified vitamin D deficiency 07/04/2012   No past surgical history on file. Social History:   reports that she has never smoked. She has never used smokeless tobacco. She reports that she does not drink alcohol or use drugs.  No family history on file.  Medications: Allergies as of 07/04/2016   No Known Allergies     Medication List       Accurate as of 07/04/16  2:49 PM. Always use your most recent med list.          Cholecalciferol 1000 units tablet Take 1 tablet (1,000 Units total) by mouth daily.   diclofenac sodium 1 % Gel Commonly known as:  VOLTAREN Apply 1 application topically 2 (two) times daily.   feeding supplement (PRO-STAT SUGAR FREE 64) Liqd Take 30 mLs by mouth 2 (two) times daily.   ferrous sulfate 325 (65 FE) MG tablet Take 325 mg by mouth daily with breakfast.   metoprolol tartrate 50 MG tablet Commonly known as:  LOPRESSOR Take 50 mg by mouth 2 (two) times daily.   multivitamin tablet Take 1 tablet by mouth daily.   polyethylene glycol powder powder Commonly known as:  GLYCOLAX/MIRALAX Take 17 g by mouth daily. Mix in 4-8 ounces of Orange Juice and take by mouth daily for constipation.   Rivastigmine 13.3 MG/24HR Pt24 Commonly known as:  EXELON Place 1 patch (13.3 mg total) onto the skin daily.   UNABLE TO FIND Med Name: Med pass 2.0 take 60 mL by mouth 2 times daily       Immunizations: Immunization History  Administered Date(s) Administered  . Influenza-Unspecified 11/29/2012, 11/21/2013, 12/12/2014, 03/09/2016  . PPD Test 10/27/2012, 11/01/2013, 10/31/2014  . Pneumococcal-Unspecified 02/10/2010     Physical Exam: Vitals:   07/04/16 1429  BP: (!) 146/80  Pulse: 80  Resp: 18  Temp: 97 F (36.1 C)  TempSrc: Oral  SpO2: 95%  Weight: 241 lb 8 oz (109.5 kg)  Height: 5\' 5"  (1.651 m)   Body mass index is 40.19 kg/m.  General-  elderly female, morbidly obese, in no acute distress Head- normocephalic, atraumatic Throat- moist mucus membrane, normal oropharynx Eyes- PERRLA, EOMI, no pallor, no icterus, no discharge Neck- no cervical lymphadenopathy Cardiovascular- normal s1,s2, no murmur Respiratory- bilateral clear to auscultation, no wheeze, no rhonchi Abdomen- bowel sounds present, soft, non tender Musculoskeletal- able to move all 4 extremities, chronic lymphedema Neurological- alert and oriented to self and place only Skin- warm and dry    Labs reviewed: Basic Metabolic Panel:  Recent Labs  16/10/96 1257 06/07/16 0315 06/09/16 0847 06/14/16  NA 146* 148* 142 149*  K 4.9 3.9 3.6 4.1  CL 116* 117* 113*  --   CO2 21* 23 22  --   GLUCOSE 126* 96 102*  --   BUN 57* 51* 37* 48*  CREATININE 1.90* 1.82* 1.42* 1.3*  CALCIUM 8.7* 8.9 8.7*  --   MG 1.9  --   --   --    Liver Function Tests:  Recent Labs  06/02/16 06/07/16 0500 06/14/16  AST 28 36 16  ALT 49* 42 20  ALKPHOS 388* 316* 239*  BILITOT  --  0.3  --   PROT  --  6.4*  --   ALBUMIN  --  1.9*  --    No results for input(s): LIPASE, AMYLASE in the last 8760 hours. No results for input(s): AMMONIA in the last 8760 hours. CBC:  Recent Labs  06/06/16 1257 06/07/16 0315 06/14/16  WBC 15.7* 16.8* 7.3  NEUTROABS 12.7*  --   --   HGB 10.1* 9.7* 9.3*  HCT 33.5* 32.1* 32*  MCV 83.8 82.9  --   PLT 397 395 402*   Cardiac Enzymes: No results for input(s): CKTOTAL, CKMB, CKMBINDEX, TROPONINI in the last 8760 hours. BNP: Invalid input(s): POCBNP CBG:  Recent Labs  06/08/16 2243 06/09/16 0752 06/09/16 1147  GLUCAP 107* 99 99    Radiological Exams: Dg Chest 2 View  Result Date: 06/06/2016 CLINICAL DATA:  Dehydration, weakness, history type II diabetes mellitus EXAM: CHEST  2 VIEW COMPARISON:  11/27/2014 FINDINGS: Enlargement of cardiac silhouette with slight pulmonary vascular congestion. Atherosclerotic calcification aorta.  Atelectasis versus infiltrate LEFT lower lobe. Remaining lungs clear. No pleural effusion or pneumothorax. Bones unremarkable. IMPRESSION: Atelectasis versus infiltrate in LEFT lower lobe. Enlargement of cardiac silhouette. Aortic atherosclerosis. Electronically Signed   By: Ulyses Southward M.D.   On: 06/06/2016 15:03   US Renal  Result Date: 06/09/2016 CLINICAL DATA:  Initial evaluation for acute renal failure. EXAM: RENAL / URINARY TRACT ULTRASOUND COMPLETE COMPARISON:  None available. FINDINGS: Right Kidney: Length: 9.1 cm. Echogenicity within normal limits. No mass or hydronephrosis visualized. Cortical thinning noted at the upper pole. Left Kidney: Length: 9.4 cm. Echogenicity within normal limits. No mass or hydronephrosis visualized. Bladder: Appears normal for degree of bladder distention. IMPRESSION: 1. Cortical thinning at the upper pole of the right kidney. 2. Otherwise negative renal ultrasound.  No hydronephrosis. Electronically Signed   By: Rise Mu M.D.   On: 06/09/2016 00:19    Assessment/Plan  Essential HTN Poorly controlled with SBP in 150s and 160s. Currently on metoprolol tartrate 50 mg bid. Add hydrochlorthiazide 12.5 mg daily and monitor. Check bmp and BP  Hypernatremia Encouraged fluid intake, her dementia and poor fluid intake is contributing to this. Monitor bmp periodically.    Family/ staff Communication: reviewed care plan with patient and nursing supervisor    Oneal Grout, MD Internal Medicine [facility] [address] [place], [place] [address] Cell Phone (Monday-Friday 8 am - 5 pm): [phone] On Call: [phone] and follow prompts after 5 pm and on weekends Office Phone: [phone] Office Fax: [phone]

## 2016-07-15 ENCOUNTER — Telehealth: Payer: Self-pay | Admitting: Endocrinology

## 2016-07-15 NOTE — Telephone Encounter (Signed)
[facility] called to check the status of the patient's appointment time. No further action required.

## 2016-07-19 ENCOUNTER — Ambulatory Visit: Payer: Medicare Other | Admitting: Endocrinology

## 2016-09-29 ENCOUNTER — Other Ambulatory Visit: Payer: Self-pay

## 2016-09-29 DIAGNOSIS — L97509 Non-pressure chronic ulcer of other part of unspecified foot with unspecified severity: Secondary | ICD-10-CM

## 2016-09-30 ENCOUNTER — Encounter: Payer: Self-pay | Admitting: Vascular Surgery

## 2016-10-12 ENCOUNTER — Other Ambulatory Visit: Payer: Self-pay

## 2016-10-12 ENCOUNTER — Ambulatory Visit (INDEPENDENT_AMBULATORY_CARE_PROVIDER_SITE_OTHER): Payer: Medicare Other | Admitting: Vascular Surgery

## 2016-10-12 ENCOUNTER — Encounter: Payer: Self-pay | Admitting: Vascular Surgery

## 2016-10-12 ENCOUNTER — Ambulatory Visit (HOSPITAL_COMMUNITY)
Admission: RE | Admit: 2016-10-12 | Discharge: 2016-10-12 | Disposition: A | Discharge status: Home / Self Care | Payer: Medicare Other | Source: Ambulatory Visit | Attending: Vascular Surgery | Admitting: Vascular Surgery

## 2016-10-12 VITALS — BP 100/51 | HR 51 | Resp 14 | Ht 66.0 in

## 2016-10-12 DIAGNOSIS — L97509 Non-pressure chronic ulcer of other part of unspecified foot with unspecified severity: Secondary | ICD-10-CM | POA: Diagnosis not present

## 2016-10-12 DIAGNOSIS — I7025 Atherosclerosis of native arteries of other extremities with ulceration: Secondary | ICD-10-CM

## 2016-10-12 DIAGNOSIS — Z89421 Acquired absence of other right toe(s): Secondary | ICD-10-CM | POA: Diagnosis not present

## 2016-10-12 DIAGNOSIS — R9439 Abnormal result of other cardiovascular function study: Secondary | ICD-10-CM | POA: Insufficient documentation

## 2016-10-12 NOTE — Progress Notes (Signed)
Requested by:  Oneal GroutPandey, Mahima, MD [address] [place], [place] [address]  Reason for consultation: right foot ulcers   History of Present Illness   Sylvia Medina is a 81 y.o. ([birth year]) female who presents with chief complaint: right foot ulcers.  The patient has limited communication so most of the history is from the daughter.  Onset of symptoms occurred >6 months ago with development of ulcers on R toes.  Daughter thinks over the last month the right heel has developed a wound.  The patient is getting wound care for the last 6 months without resolution of toe ulcers.  Reported not fever or chills.  Limited drainage from right foot ulcers.  Pain is described as sharp, severity 5-10/10, and associated with manipulating the right foot.  Patient has attempted to treat this pain with wound care.    This patient is bed bound due to stroke.  She does not get out bed on her own.  Atherosclerotic risk factors include: DM 2.  Past Medical History:  Diagnosis Date  . Anemia 07/04/2012  . Cardiac arrhythmia 07/04/2012  . CVA (cerebral vascular accident) (HCC) 07/04/2012  . Dementia 07/04/2012  . Diabetes mellitus type 2, diet-controlled (HCC) 07/04/2012  . Unspecified constipation 07/04/2012  . Unspecified hypothyroidism 07/04/2012  . Unspecified vitamin D deficiency 07/04/2012   Past Surgical History: wound care and associated debridement  Social History   Social History  . Marital status: Widowed    Spouse name: N/A  . Number of children: N/A  . Years of education: N/A   Occupational History  . Not on file.   Social History Main Topics  . Smoking status: Never Smoker  . Smokeless tobacco: Never Used  . Alcohol use No  . Drug use: No  . Sexual activity: Not Currently   Other Topics Concern  . Not on file   Social History Narrative  . No narrative on file    Family History: patient is unable to detail the medical history of his parents   Current Outpatient  Prescriptions  Medication Sig Dispense Refill  . Amino Acids-Protein Hydrolys (FEEDING SUPPLEMENT, PRO-STAT SUGAR FREE 64,) LIQD Take 30 mLs by mouth 2 (two) times daily.     . Cholecalciferol 1000 units tablet Take 1 tablet (1,000 Units total) by mouth daily.    . diclofenac sodium (VOLTAREN) 1 % GEL Apply 1 application topically 2 (two) times daily.     . ferrous sulfate 325 (65 FE) MG tablet Take 325 mg by mouth daily with breakfast.     . metoprolol (LOPRESSOR) 50 MG tablet Take 50 mg by mouth 2 (two) times daily.    . Multiple Vitamin (MULTIVITAMIN) tablet Take 1 tablet by mouth daily.    . polyethylene glycol powder (GLYCOLAX/MIRALAX) powder Take 17 g by mouth daily. Mix in 4-8 ounces of Orange Juice and take by mouth daily for constipation.    . Rivastigmine (EXELON) 13.3 MG/24HR PT24 Place 1 patch (13.3 mg total) onto the skin daily. 30 patch   . UNABLE TO FIND Med Name: Med pass 2.0 take 60 mL by mouth 2 times daily     No current facility-administered medications for this visit.     No Known Allergies  REVIEW OF SYSTEMS (negative unless checked):   Cardiac:  []  Chest pain or chest pressure? []  Shortness of breath upon activity? []  Shortness of breath when lying flat? []  Irregular heart rhythm?  Vascular:  []  Pain in calf, thigh, or  hip brought on by walking? [] Pain in feet at night that wakes you up from your sleep? [] Blood clot in your veins? [] Leg swelling?  Pulmonary:  [] Oxygen at home? [] Productive cough? [] Wheezing?  Neurologic:  [x] Sudden weakness in arms or legs? [] Sudden numbness in arms or legs? [] Sudden onset of difficult speaking or slurred speech? [] Temporary loss of vision in one eye? [] Problems with dizziness?  Gastrointestinal:  [] Blood in stool? [] Vomited blood?  Genitourinary:  [] Burning when urinating? [] Blood in urine?  Psychiatric:  [] Major depression  Hematologic:  [] Bleeding problems? [] Problems with blood  clotting?  Dermatologic:  [x] Rashes or ulcers?  Constitutional:  [] Fever or chills?  Ear/Nose/Throat:  [] Change in hearing? [] Nose bleeds? [] Sore throat?  Musculoskeletal:  [] Back pain? [] Joint pain? [] Muscle pain?   For VQI Use Only   PRE-ADM LIVING Nursing home  AMB STATUS Bedridden  CAD Sx None  PRIOR CHF None  STRESS TEST No    Physical Examination     Vitals:   10/12/16 1110  BP: (!) 100/51  Pulse: (!) 51  Resp: 14  SpO2: 99%  Height: 5' 6" (1.676 m)   There is no height or weight on file to calculate BMI.  General Alert, O x 3, lipedemic fat distribution, Ill appearing  Head [place]/AT, Temporalis wasting  Ear/Nose/ Throat Hearing grossly intact, nares without erythema or drainage, oropharynx without Erythema or Exudate, Mallampati score: 2, edentulous  Eyes PERRL, EOMI,    Neck Supple, mid-line trachea,    Pulmonary Sym exp, decreased BS bilaterally,   Cardiac RRR, Nl S1, S2, no Murmurs, No rubs, No S3,S4  Vascular Vessel Right Left  Radial Faintly palpable Faintly palpable  Brachial Faintly palpable Faintly palpable  Carotid Palpable, No Bruit Palpable, No Bruit  Aorta Not palpable N/A  Femoral Not palpable due to pannus Not palpable due to pannus  Popliteal Not palpable Not palpable  PT Not palpable Not palpable  DP Not palpable Not palpable    Gastro- intestinal soft, non-distended, non-tender to palpation, No guarding or rebound, no HSM, no masses, no CVAT B, No palpable prominent aortic pulse,    Musculo- skeletal M/S 5/5 throughout upper extremities, no motor in legs, lipedema fat distribution in arms and legs, R knee and ankle fixed without significant movement, multiple ischemic toes with ulcers on distal phalanges, ulcer on rotated right medial malleolus/heel in a dependent position  Neurologic Limited Neuro exam, pt able to mostly cooperate but occasionally non-responsive, Pain and light touch intact in extremities, Motor exam as  listed above  Psychiatric Judgement difficult to discern due to limited interaction, Mood & affect difficult to determine  Dermatologic See M/S exam for extremity exam, No rashes otherwise noted, cannot evaluate sacrum and dependent position due to inability to reposition patient  Lymphatic  Palpable lymph nodes: None    Non-Invasive Vascular Imaging   ABI (10/12/2016)  R:   ABI: 0,   PT: cannot evaluate due to ankle rotation  DP: none  TBI:  No signal  L:   ABI: cannot be recorded  PT: dampened mono  DP: dampened mono  TBI: 0.24   Outside Studies/Documentation   10 pages of outside documents were reviewed including: nursing home paper work.   Medical Decision Making   Paxtyn B Pollet is a 81 y.o. female who presents with: BLE critical limb ischemia, R foot ulcers,   DM, CVA, chronic bedbound status, chronic kidney disease stage 2-3    This patient is chronically debilitated with likely severe tibial and femoropopliteal disease in both legs.  In general, I do not offer surgical revascularization in patient that do not walk as the potential risk of mortality cannot be justified in patients that do not walk.  The patient's daughter would like further evaluation of this patient's arterial system.    Given the limitation of non-invasive studies, I would agree with proceeding with angiography.  I doubt this patient can fit on the usual angiographic bed, so it would reason to do the procedure in the hybrid OR.  I discussed with the patient the natural history of critical limb ischemia: 25% require amputation in one year, 50% are able to maintain their limbs in one year, and 25-30% die in one year due to comorbidities.  Given the limb threatening status of this patient, I recommend an aggressive work up including proceeding with an: Aortogram, Right runoff and possible intervention R leg. I discussed with the patient the nature of angiographic procedures, especially the  limited patencies of any endovascular intervention. The patient is aware of that the risks of an angiographic procedure include but are not limited to: bleeding, infection, access site complications, embolization, rupture of treated vessel, dissection, possible need for emergent surgical intervention, and possible need for surgical procedures to treat the patient's pathology. The patient is aware of the risks and agrees to proceed.  The procedure is scheduled for: 6 SEPT 18.  I discussed in depth with the patient the nature of atherosclerosis, and emphasized the importance of maximal medical management including strict control of blood pressure, blood glucose, and lipid levels, antiplatelet agents, obtaining regular exercise, and cessation of smoking.  The patient is aware that without maximal medical management the underlying atherosclerotic disease process will progress, limiting the benefit of any interventions. The patient is currently not statin.  Need to obtain additional labwork prior to starting.  The patient is currently not on an anti-platelet. Patient will be started on ASA 81 mg PO daily  Thank you for allowing us to participate in this patient's care.   Leonides SakeBrian Chen, MD, FACS [facility] Office: [phone] Pager: [phone]  10/12/2016, 11:57 AM

## 2016-10-20 ENCOUNTER — Ambulatory Visit (HOSPITAL_COMMUNITY)
Admission: RE | Admit: 2016-10-20 | Discharge: 2016-10-20 | Disposition: A | Discharge status: Home / Self Care | Payer: Medicare Other | Source: Ambulatory Visit | Attending: Vascular Surgery | Admitting: Vascular Surgery

## 2016-10-20 ENCOUNTER — Encounter (HOSPITAL_COMMUNITY)
Admission: RE | Disposition: A | Discharge status: Home / Self Care | Payer: Self-pay | Source: Ambulatory Visit | Attending: Vascular Surgery

## 2016-10-20 ENCOUNTER — Telehealth: Payer: Self-pay

## 2016-10-20 DIAGNOSIS — Z7401 Bed confinement status: Secondary | ICD-10-CM | POA: Insufficient documentation

## 2016-10-20 DIAGNOSIS — L97519 Non-pressure chronic ulcer of other part of right foot with unspecified severity: Secondary | ICD-10-CM | POA: Insufficient documentation

## 2016-10-20 DIAGNOSIS — I739 Peripheral vascular disease, unspecified: Secondary | ICD-10-CM | POA: Insufficient documentation

## 2016-10-20 DIAGNOSIS — Z5309 Procedure and treatment not carried out because of other contraindication: Secondary | ICD-10-CM | POA: Insufficient documentation

## 2016-10-20 DIAGNOSIS — N183 Chronic kidney disease, stage 3 (moderate): Secondary | ICD-10-CM | POA: Insufficient documentation

## 2016-10-20 DIAGNOSIS — E11621 Type 2 diabetes mellitus with foot ulcer: Secondary | ICD-10-CM | POA: Diagnosis not present

## 2016-10-20 DIAGNOSIS — E1122 Type 2 diabetes mellitus with diabetic chronic kidney disease: Secondary | ICD-10-CM | POA: Insufficient documentation

## 2016-10-20 LAB — POCT I-STAT, CHEM 8
BUN: 75 mg/dL — ABNORMAL HIGH (ref 6–20)
CHLORIDE: 112 mmol/L — AB (ref 101–111)
Calcium, Ion: 1.15 mmol/L (ref 1.15–1.40)
Creatinine, Ser: 2.7 mg/dL — ABNORMAL HIGH (ref 0.44–1.00)
GLUCOSE: 66 mg/dL (ref 65–99)
HEMATOCRIT: 39 % (ref 36.0–46.0)
Hemoglobin: 13.3 g/dL (ref 12.0–15.0)
POTASSIUM: 4.3 mmol/L (ref 3.5–5.1)
Sodium: 140 mmol/L (ref 135–145)
TCO2: 20 mmol/L — ABNORMAL LOW (ref 22–32)

## 2016-10-20 SURGERY — ABDOMINAL AORTOGRAM W/LOWER EXTREMITY
Anesthesia: LOCAL

## 2016-10-20 MED ORDER — SODIUM CHLORIDE 0.9 % IV SOLN
INTRAVENOUS | Status: DC
  Administered 2016-10-20: 08:00:00 via INTRAVENOUS

## 2016-10-20 NOTE — Telephone Encounter (Signed)
Rec'd verbal order per Dr. Imogene Burnhen to reschedule pt's Aortogram with right lower extremity runoff; possible intervention on 10/27/16.  Also gave order to advise [facility] to start IV of 1000cc NS on 9/12 and to infuse overnight @ 100cc/ hr. Prior to the aortogram on 9/13.    Phone call to [facility].  Spoke with Sylvia Medina.  Advised of plan to reschedule procedure to 9/13, and of order for IV hydration to be given overnight on 9/12, prior to the procedure on 9/13.  Verb. Understanding.  [facility].    Daughter, Sylvia Medina made aware of the plan.  Verb. Understanding.

## 2016-10-20 NOTE — Progress Notes (Signed)
Right hand PIV infiltrate of NS.  IV d/c'd, compression wrap applied, arm elevated.  Will continue to monitor.

## 2016-10-20 NOTE — Progress Notes (Signed)
[facility]

## 2016-10-20 NOTE — H&P (View-Only) (Signed)
Requested by:  Oneal GroutPandey, Mahima, MD [address] [place], [place] [address]  Reason for consultation: right foot ulcers   History of Present Illness   Sylvia Medina is a 81 y.o. ([birth year]) female who presents with chief complaint: right foot ulcers.  The patient has limited communication so most of the history is from the daughter.  Onset of symptoms occurred >6 months ago with development of ulcers on R toes.  Daughter thinks over the last month the right heel has developed a wound.  The patient is getting wound care for the last 6 months without resolution of toe ulcers.  Reported not fever or chills.  Limited drainage from right foot ulcers.  Pain is described as sharp, severity 5-10/10, and associated with manipulating the right foot.  Patient has attempted to treat this pain with wound care.    This patient is bed bound due to stroke.  She does not get out bed on her own.  Atherosclerotic risk factors include: DM 2.  Past Medical History:  Diagnosis Date  . Anemia 07/04/2012  . Cardiac arrhythmia 07/04/2012  . CVA (cerebral vascular accident) (HCC) 07/04/2012  . Dementia 07/04/2012  . Diabetes mellitus type 2, diet-controlled (HCC) 07/04/2012  . Unspecified constipation 07/04/2012  . Unspecified hypothyroidism 07/04/2012  . Unspecified vitamin D deficiency 07/04/2012   Past Surgical History: wound care and associated debridement  Social History   Social History  . Marital status: Widowed    Spouse name: N/A  . Number of children: N/A  . Years of education: N/A   Occupational History  . Not on file.   Social History Main Topics  . Smoking status: Never Smoker  . Smokeless tobacco: Never Used  . Alcohol use No  . Drug use: No  . Sexual activity: Not Currently   Other Topics Concern  . Not on file   Social History Narrative  . No narrative on file    Family History: patient is unable to detail the medical history of his parents   Current Outpatient  Prescriptions  Medication Sig Dispense Refill  . Amino Acids-Protein Hydrolys (FEEDING SUPPLEMENT, PRO-STAT SUGAR FREE 64,) LIQD Take 30 mLs by mouth 2 (two) times daily.     . Cholecalciferol 1000 units tablet Take 1 tablet (1,000 Units total) by mouth daily.    . diclofenac sodium (VOLTAREN) 1 % GEL Apply 1 application topically 2 (two) times daily.     . ferrous sulfate 325 (65 FE) MG tablet Take 325 mg by mouth daily with breakfast.     . metoprolol (LOPRESSOR) 50 MG tablet Take 50 mg by mouth 2 (two) times daily.    . Multiple Vitamin (MULTIVITAMIN) tablet Take 1 tablet by mouth daily.    . polyethylene glycol powder (GLYCOLAX/MIRALAX) powder Take 17 g by mouth daily. Mix in 4-8 ounces of Orange Juice and take by mouth daily for constipation.    . Rivastigmine (EXELON) 13.3 MG/24HR PT24 Place 1 patch (13.3 mg total) onto the skin daily. 30 patch   . UNABLE TO FIND Med Name: Med pass 2.0 take 60 mL by mouth 2 times daily     No current facility-administered medications for this visit.     No Known Allergies  REVIEW OF SYSTEMS (negative unless checked):   Cardiac:  []  Chest pain or chest pressure? []  Shortness of breath upon activity? []  Shortness of breath when lying flat? []  Irregular heart rhythm?  Vascular:  []  Pain in calf, thigh, or  hip brought on by walking?  Pain in feet at night that wakes you up from your sleep?  Blood clot in your veins?  Leg swelling?  Pulmonary:   Oxygen at home?  Productive cough?  Wheezing?  Neurologic:   Sudden weakness in arms or legs?  Sudden numbness in arms or legs?  Sudden onset of difficult speaking or slurred speech?  Temporary loss of vision in one eye?  Problems with dizziness?  Gastrointestinal:   Blood in stool?  Vomited blood?  Genitourinary:   Burning when urinating?  Blood in urine?  Psychiatric:   Major depression  Hematologic:   Bleeding problems?  Problems with blood  clotting?  Dermatologic:   Rashes or ulcers?  Constitutional:   Fever or chills?  Ear/Nose/Throat:   Change in hearing?  Nose bleeds?  Sore throat?  Musculoskeletal:   Back pain?  Joint pain?  Muscle pain?   For VQI Use Only   PRE-ADM LIVING Nursing home  AMB STATUS Bedridden  CAD Sx None  PRIOR CHF None  STRESS TEST No    Physical Examination     Vitals:   10/12/16 1110  BP: (!) 100/51  Pulse: (!) 51  Resp: 14  SpO2: 99%  Height:  (1.676 m)   There is no height or weight on file to calculate BMI.  General Alert, O x 3, lipedemic fat distribution, Ill appearing  Head [place]/AT, Temporalis wasting  Ear/Nose/ Throat Hearing grossly intact, nares without erythema or drainage, oropharynx without Erythema or Exudate, Mallampati score: 2, edentulous  Eyes PERRL, EOMI,    Neck Supple, mid-line trachea,    Pulmonary Sym exp, decreased BS bilaterally,   Cardiac RRR, Nl S1, S2, no Murmurs, No rubs, No S3,S4  Vascular Vessel Right Left  Radial Faintly palpable Faintly palpable  Brachial Faintly palpable Faintly palpable  Carotid Palpable, No Bruit Palpable, No Bruit  Aorta Not palpable N/A  Femoral Not palpable due to pannus Not palpable due to pannus  Popliteal Not palpable Not palpable  PT Not palpable Not palpable  DP Not palpable Not palpable    Gastro- intestinal soft, non-distended, non-tender to palpation, No guarding or rebound, no HSM, no masses, no CVAT B, No palpable prominent aortic pulse,    Musculo- skeletal M/S 5/5 throughout upper extremities, no motor in legs, lipedema fat distribution in arms and legs, R knee and ankle fixed without significant movement, multiple ischemic toes with ulcers on distal phalanges, ulcer on rotated right medial malleolus/heel in a dependent position  Neurologic Limited Neuro exam, pt able to mostly cooperate but occasionally non-responsive, Pain and light touch intact in extremities, Motor exam as  listed above  Psychiatric Judgement difficult to discern due to limited interaction, Mood & affect difficult to determine  Dermatologic See M/S exam for extremity exam, No rashes otherwise noted, cannot evaluate sacrum and dependent position due to inability to reposition patient  Lymphatic  Palpable lymph nodes: None    Non-Invasive Vascular Imaging   ABI (10/12/2016)  R:   ABI: 0,   PT: cannot evaluate due to ankle rotation  DP: none  TBI:  No signal  L:   ABI: cannot be recorded  PT: dampened mono  DP: dampened mono  TBI: 0.24   Outside Studies/Documentation   10 pages of outside documents were reviewed including: nursing home paper work.   Medical Decision Making   YURITZY ZEHRING is a 81 y.o. female who presents with: BLE critical limb ischemia, R foot ulcers,  DM, CVA, chronic bedbound status, chronic kidney disease stage 2-3    This patient is chronically debilitated with likely severe tibial and femoropopliteal disease in both legs.  In general, I do not offer surgical revascularization in patient that do not walk as the potential risk of mortality cannot be justified in patients that do not walk.  The patient's daughter would like further evaluation of this patient's arterial system.    Given the limitation of non-invasive studies, I would agree with proceeding with angiography.  I doubt this patient can fit on the usual angiographic bed, so it would reason to do the procedure in the hybrid OR.  I discussed with the patient the natural history of critical limb ischemia: 25% require amputation in one year, 50% are able to maintain their limbs in one year, and 25-30% die in one year due to comorbidities.  Given the limb threatening status of this patient, I recommend an aggressive work up including proceeding with an: Aortogram, Right runoff and possible intervention R leg. I discussed with the patient the nature of angiographic procedures, especially the  limited patencies of any endovascular intervention. The patient is aware of that the risks of an angiographic procedure include but are not limited to: bleeding, infection, access site complications, embolization, rupture of treated vessel, dissection, possible need for emergent surgical intervention, and possible need for surgical procedures to treat the patient's pathology. The patient is aware of the risks and agrees to proceed.  The procedure is scheduled for: 6 SEPT 18.  I discussed in depth with the patient the nature of atherosclerosis, and emphasized the importance of maximal medical management including strict control of blood pressure, blood glucose, and lipid levels, antiplatelet agents, obtaining regular exercise, and cessation of smoking.  The patient is aware that without maximal medical management the underlying atherosclerotic disease process will progress, limiting the benefit of any interventions. The patient is currently not statin.  Need to obtain additional labwork prior to starting.  The patient is currently not on an anti-platelet. Patient will be started on ASA 81 mg PO daily  Thank you for allowing us to participate in this patient's care.   Leonides SakeBrian Priscella Donna, MD, FACS [facility] [place] Office: [phone] Pager: [phone]  10/12/2016, 11:57 AM

## 2016-10-20 NOTE — Interval H&P Note (Signed)
   History and Physical Update  The patient was interviewed and re-examined.  The patient's previous History and Physical has been reviewed and is unchanged from my consult.  Unfortunately, the patient's chronic kidney disease has worsened as evident by Cr 2.7 today.  We discussed the risks of CIN related to contrast, so will reschedule the patient for next week.  Will need to arrange with nursing home improved hydration if not IV rehydration next Wednesday night prior to try again next Thursday.  - This plan was discussed with patient and daughter. - Will cancel case for today and finish infusing 1 L NS via IV. - Reschedule for next Thursday   Leonides SakeBrian Chen, MD, [facility] [place] Office: [phone] Pager: [phone]  10/20/2016, 9:51 AM

## 2016-10-24 ENCOUNTER — Other Ambulatory Visit: Payer: Self-pay

## 2016-10-26 ENCOUNTER — Other Ambulatory Visit: Payer: Self-pay

## 2016-10-27 ENCOUNTER — Inpatient Hospital Stay (HOSPITAL_COMMUNITY)
Admission: AD | Admit: 2016-10-27 | Discharge: 2016-10-31 | DRG: 253 | Disposition: A | Discharge status: Skilled Nursing Facility | Payer: Medicare Other | Source: Ambulatory Visit | Attending: Vascular Surgery | Admitting: Vascular Surgery

## 2016-10-27 ENCOUNTER — Encounter (HOSPITAL_COMMUNITY)
Admission: AD | Disposition: A | Discharge status: Skilled Nursing Facility | Payer: Self-pay | Source: Ambulatory Visit | Attending: Vascular Surgery

## 2016-10-27 DIAGNOSIS — T447X5A Adverse effect of beta-adrenoreceptor antagonists, initial encounter: Secondary | ICD-10-CM | POA: Diagnosis present

## 2016-10-27 DIAGNOSIS — I69398 Other sequelae of cerebral infarction: Secondary | ICD-10-CM

## 2016-10-27 DIAGNOSIS — I70299 Other atherosclerosis of native arteries of extremities, unspecified extremity: Secondary | ICD-10-CM | POA: Diagnosis present

## 2016-10-27 DIAGNOSIS — Y92230 Patient room in hospital as the place of occurrence of the external cause: Secondary | ICD-10-CM | POA: Diagnosis present

## 2016-10-27 DIAGNOSIS — N184 Chronic kidney disease, stage 4 (severe): Secondary | ICD-10-CM | POA: Diagnosis present

## 2016-10-27 DIAGNOSIS — I70235 Atherosclerosis of native arteries of right leg with ulceration of other part of foot: Principal | ICD-10-CM | POA: Diagnosis present

## 2016-10-27 DIAGNOSIS — I129 Hypertensive chronic kidney disease with stage 1 through stage 4 chronic kidney disease, or unspecified chronic kidney disease: Secondary | ICD-10-CM | POA: Diagnosis present

## 2016-10-27 DIAGNOSIS — E1122 Type 2 diabetes mellitus with diabetic chronic kidney disease: Secondary | ICD-10-CM | POA: Diagnosis present

## 2016-10-27 DIAGNOSIS — I70262 Atherosclerosis of native arteries of extremities with gangrene, left leg: Secondary | ICD-10-CM | POA: Diagnosis present

## 2016-10-27 DIAGNOSIS — E1151 Type 2 diabetes mellitus with diabetic peripheral angiopathy without gangrene: Secondary | ICD-10-CM | POA: Diagnosis present

## 2016-10-27 DIAGNOSIS — Z79899 Other long term (current) drug therapy: Secondary | ICD-10-CM

## 2016-10-27 DIAGNOSIS — L97909 Non-pressure chronic ulcer of unspecified part of unspecified lower leg with unspecified severity: Secondary | ICD-10-CM

## 2016-10-27 DIAGNOSIS — I70292 Other atherosclerosis of native arteries of extremities, left leg: Secondary | ICD-10-CM | POA: Diagnosis present

## 2016-10-27 DIAGNOSIS — Z6841 Body Mass Index (BMI) 40.0 and over, adult: Secondary | ICD-10-CM

## 2016-10-27 DIAGNOSIS — L97519 Non-pressure chronic ulcer of other part of right foot with unspecified severity: Secondary | ICD-10-CM | POA: Diagnosis present

## 2016-10-27 DIAGNOSIS — I952 Hypotension due to drugs: Secondary | ICD-10-CM | POA: Diagnosis not present

## 2016-10-27 DIAGNOSIS — R5381 Other malaise: Secondary | ICD-10-CM | POA: Diagnosis present

## 2016-10-27 DIAGNOSIS — Z7401 Bed confinement status: Secondary | ICD-10-CM

## 2016-10-27 DIAGNOSIS — I7092 Chronic total occlusion of artery of the extremities: Secondary | ICD-10-CM | POA: Diagnosis present

## 2016-10-27 DIAGNOSIS — E11621 Type 2 diabetes mellitus with foot ulcer: Secondary | ICD-10-CM | POA: Diagnosis present

## 2016-10-27 DIAGNOSIS — Z23 Encounter for immunization: Secondary | ICD-10-CM

## 2016-10-27 LAB — GLUCOSE, CAPILLARY
GLUCOSE-CAPILLARY: 93 mg/dL (ref 65–99)
GLUCOSE-CAPILLARY: 90 mg/dL (ref 65–99)
Glucose-Capillary: 83 mg/dL (ref 65–99)

## 2016-10-27 LAB — CBC
HCT: 34.6 % — ABNORMAL LOW (ref 36.0–46.0)
HEMOGLOBIN: 10.7 g/dL — AB (ref 12.0–15.0)
MCH: 25.7 pg — ABNORMAL LOW (ref 26.0–34.0)
MCHC: 30.9 g/dL (ref 30.0–36.0)
MCV: 83 fL (ref 78.0–100.0)
PLATELETS: 127 10*3/uL — AB (ref 150–400)
RBC: 4.17 MIL/uL (ref 3.87–5.11)
RDW: 19.6 % — AB (ref 11.5–15.5)
WBC: 6.2 10*3/uL (ref 4.0–10.5)

## 2016-10-27 LAB — POCT I-STAT, CHEM 8
BUN: 62 mg/dL — AB (ref 6–20)
CALCIUM ION: 1.18 mmol/L (ref 1.15–1.40)
CHLORIDE: 117 mmol/L — AB (ref 101–111)
Creatinine, Ser: 2.6 mg/dL — ABNORMAL HIGH (ref 0.44–1.00)
Glucose, Bld: 70 mg/dL (ref 65–99)
HEMATOCRIT: 40 % (ref 36.0–46.0)
Hemoglobin: 13.6 g/dL (ref 12.0–15.0)
Potassium: 4.6 mmol/L (ref 3.5–5.1)
SODIUM: 148 mmol/L — AB (ref 135–145)
TCO2: 22 mmol/L (ref 22–32)

## 2016-10-27 LAB — CREATININE, SERUM
CREATININE: 2.61 mg/dL — AB (ref 0.44–1.00)
GFR calc Af Amer: 19 mL/min — ABNORMAL LOW (ref >60)
GFR, EST NON AFRICAN AMERICAN: 16 mL/min — AB (ref >60)

## 2016-10-27 SURGERY — ABDOMINAL AORTOGRAM W/LOWER EXTREMITY
Anesthesia: LOCAL

## 2016-10-27 MED ORDER — FERROUS SULFATE 325 (65 FE) MG PO TABS
325.0000 mg | ORAL_TABLET | Freq: Every day | ORAL | Status: DC
  Administered 2016-10-28 – 2016-10-31 (×4): 325 mg via ORAL
  Filled 2016-10-27 (×4): qty 1

## 2016-10-27 MED ORDER — PANTOPRAZOLE SODIUM 40 MG PO TBEC
40.0000 mg | DELAYED_RELEASE_TABLET | Freq: Every day | ORAL | Status: DC
Start: 2016-10-27 — End: 2016-10-31
  Administered 2016-10-28 – 2016-10-31 (×4): 40 mg via ORAL
  Filled 2016-10-27 (×4): qty 1

## 2016-10-27 MED ORDER — HYDROCHLOROTHIAZIDE 25 MG PO TABS
12.5000 mg | ORAL_TABLET | Freq: Every day | ORAL | Status: DC
  Administered 2016-10-29 – 2016-10-31 (×3): 12.5 mg via ORAL
  Filled 2016-10-27 (×3): qty 1

## 2016-10-27 MED ORDER — POLYETHYLENE GLYCOL 3350 17 GM/SCOOP PO POWD: 17.0000 g | Freq: Every day | ORAL | Status: DC

## 2016-10-27 MED ORDER — ONDANSETRON HCL 4 MG/2ML IJ SOLN: 4.0000 mg | Freq: Four times a day (QID) | INTRAMUSCULAR | Status: DC | PRN

## 2016-10-27 MED ORDER — VITAMIN D 1000 UNITS PO TABS
1000.0000 [IU] | ORAL_TABLET | Freq: Every day | ORAL | Status: DC
  Administered 2016-10-27 – 2016-10-31 (×5): 1000 [IU] via ORAL
  Filled 2016-10-27 (×7): qty 1

## 2016-10-27 MED ORDER — FLEET ENEMA 7-19 GM/118ML RE ENEM: 1.0000 | ENEMA | Freq: Once | RECTAL | Status: DC | PRN

## 2016-10-27 MED ORDER — ACETAMINOPHEN 325 MG PO TABS: 325.0000 mg | ORAL_TABLET | ORAL | Status: DC | PRN

## 2016-10-27 MED ORDER — METOPROLOL TARTRATE 50 MG PO TABS
50.0000 mg | ORAL_TABLET | Freq: Two times a day (BID) | ORAL | Status: DC
  Administered 2016-10-29: 50 mg via ORAL
  Filled 2016-10-27 (×4): qty 1

## 2016-10-27 MED ORDER — ADULT MULTIVITAMIN W/MINERALS CH
1.0000 | ORAL_TABLET | Freq: Every day | ORAL | Status: DC
  Administered 2016-10-28 – 2016-10-31 (×4): 1 via ORAL
  Filled 2016-10-27 (×4): qty 1

## 2016-10-27 MED ORDER — RIVASTIGMINE 13.3 MG/24HR TD PT24
13.3000 mg | MEDICATED_PATCH | Freq: Every day | TRANSDERMAL | Status: DC
  Administered 2016-10-27 – 2016-10-30 (×4): 13.3 mg via TRANSDERMAL
  Filled 2016-10-27 (×4): qty 1

## 2016-10-27 MED ORDER — HEPARIN SODIUM (PORCINE) 5000 UNIT/ML IJ SOLN
5000.0000 [IU] | Freq: Three times a day (TID) | INTRAMUSCULAR | Status: DC
  Administered 2016-10-27 – 2016-10-30 (×9): 5000 [IU] via SUBCUTANEOUS
  Filled 2016-10-27 (×10): qty 1

## 2016-10-27 MED ORDER — OXYCODONE HCL 5 MG PO TABS
5.0000 mg | ORAL_TABLET | Freq: Four times a day (QID) | ORAL | Status: DC | PRN
  Administered 2016-10-29: 5 mg via ORAL
  Filled 2016-10-27: qty 1

## 2016-10-27 MED ORDER — HYDRALAZINE HCL 20 MG/ML IJ SOLN: 5.0000 mg | INTRAMUSCULAR | Status: DC | PRN

## 2016-10-27 MED ORDER — ACETAMINOPHEN 325 MG RE SUPP
325.0000 mg | RECTAL | Status: DC | PRN
  Filled 2016-10-27: qty 2

## 2016-10-27 MED ORDER — LABETALOL HCL 5 MG/ML IV SOLN: 10.0000 mg | INTRAVENOUS | Status: DC | PRN

## 2016-10-27 MED ORDER — SODIUM CHLORIDE 0.9 % IV SOLN: INTRAVENOUS | Status: DC

## 2016-10-27 MED ORDER — METOPROLOL TARTRATE 5 MG/5ML IV SOLN: 2.0000 mg | INTRAVENOUS | Status: DC | PRN

## 2016-10-27 MED ORDER — BISACODYL 10 MG RE SUPP: 10.0000 mg | Freq: Every day | RECTAL | Status: DC | PRN

## 2016-10-27 MED ORDER — POLYETHYLENE GLYCOL 3350 17 G PO PACK: 17.0000 g | PACK | Freq: Every day | ORAL | Status: DC | PRN

## 2016-10-27 MED ORDER — POLYETHYLENE GLYCOL 3350 17 G PO PACK
17.0000 g | PACK | Freq: Every day | ORAL | Status: DC
  Administered 2016-10-28 – 2016-10-31 (×4): 17 g via ORAL
  Filled 2016-10-27 (×4): qty 1

## 2016-10-27 MED ORDER — ALUM & MAG HYDROXIDE-SIMETH 200-200-20 MG/5ML PO SUSP: 15.0000 mL | ORAL | Status: DC | PRN

## 2016-10-27 MED ORDER — DOCUSATE SODIUM 100 MG PO CAPS
100.0000 mg | ORAL_CAPSULE | Freq: Two times a day (BID) | ORAL | Status: DC
  Administered 2016-10-27 – 2016-10-31 (×7): 100 mg via ORAL
  Filled 2016-10-27 (×7): qty 1

## 2016-10-27 MED ORDER — SODIUM CHLORIDE 0.9 % IV SOLN
INTRAVENOUS | Status: DC
  Administered 2016-10-28 – 2016-10-29 (×2): via INTRAVENOUS

## 2016-10-27 NOTE — Progress Notes (Signed)
   Daily Progress Note   Pt will be admitted overnight for IV hydration and bicarb protocol to help prevent CIN.  Dr. Darrick PennaFields will completed Aortogram, right leg runoff, and possible intervention tomorrow.   Leonides SakeBrian Reuel Lamadrid, MD, FACS [facility] [place] Office: [phone] Pager: [phone]  10/27/2016, 8:49 AM

## 2016-10-27 NOTE — Progress Notes (Signed)
Pt arrived per transportation from nursing home.  Daughter states she was supposed to get overnight [facility] last night but they were unable to get IV access.  Pt's creatnine is 2.6 today.  This was reported to Dr Imogene Burnhen,  Pt currently has IV access and is receiving hydration per orders. She is ready for her PV procedure.   Awaiting further instruction from Dr Imogene Burnhen.

## 2016-10-27 NOTE — Progress Notes (Signed)
Received report from London PepperJanice Walker RN, pt is sleeping, had lunch, glucose checked,  pt wakes to loud voice but does not speak, no family here at this time, [facility], pt was turned to be put on a hover matt earlier, pt has una on the right foot several pressure pads on her left side of her leg and buttock. Pt waiting for inpt bed.

## 2016-10-27 NOTE — H&P (View-Only) (Signed)
Requested by:  Oneal GroutPandey, Mahima, MD [address] [place], [place] [address]  Reason for consultation: right foot ulcers   History of Present Illness   Sylvia Medina is a 81 y.o. ([birth year]) female who presents with chief complaint: right foot ulcers.  The patient has limited communication so most of the history is from the daughter.  Onset of symptoms occurred >6 months ago with development of ulcers on R toes.  Daughter thinks over the last month the right heel has developed a wound.  The patient is getting wound care for the last 6 months without resolution of toe ulcers.  Reported not fever or chills.  Limited drainage from right foot ulcers.  Pain is described as sharp, severity 5-10/10, and associated with manipulating the right foot.  Patient has attempted to treat this pain with wound care.    This patient is bed bound due to stroke.  She does not get out bed on her own.  Atherosclerotic risk factors include: DM 2.  Past Medical History:  Diagnosis Date  . Anemia 07/04/2012  . Cardiac arrhythmia 07/04/2012  . CVA (cerebral vascular accident) (HCC) 07/04/2012  . Dementia 07/04/2012  . Diabetes mellitus type 2, diet-controlled (HCC) 07/04/2012  . Unspecified constipation 07/04/2012  . Unspecified hypothyroidism 07/04/2012  . Unspecified vitamin D deficiency 07/04/2012   Past Surgical History: wound care and associated debridement  Social History   Social History  . Marital status: Widowed    Spouse name: N/A  . Number of children: N/A  . Years of education: N/A   Occupational History  . Not on file.   Social History Main Topics  . Smoking status: Never Smoker  . Smokeless tobacco: Never Used  . Alcohol use No  . Drug use: No  . Sexual activity: Not Currently   Other Topics Concern  . Not on file   Social History Narrative  . No narrative on file    Family History: patient is unable to detail the medical history of his parents   Current Outpatient  Prescriptions  Medication Sig Dispense Refill  . Amino Acids-Protein Hydrolys (FEEDING SUPPLEMENT, PRO-STAT SUGAR FREE 64,) LIQD Take 30 mLs by mouth 2 (two) times daily.     . Cholecalciferol 1000 units tablet Take 1 tablet (1,000 Units total) by mouth daily.    . diclofenac sodium (VOLTAREN) 1 % GEL Apply 1 application topically 2 (two) times daily.     . ferrous sulfate 325 (65 FE) MG tablet Take 325 mg by mouth daily with breakfast.     . metoprolol (LOPRESSOR) 50 MG tablet Take 50 mg by mouth 2 (two) times daily.    . Multiple Vitamin (MULTIVITAMIN) tablet Take 1 tablet by mouth daily.    . polyethylene glycol powder (GLYCOLAX/MIRALAX) powder Take 17 g by mouth daily. Mix in 4-8 ounces of Orange Juice and take by mouth daily for constipation.    . Rivastigmine (EXELON) 13.3 MG/24HR PT24 Place 1 patch (13.3 mg total) onto the skin daily. 30 patch   . UNABLE TO FIND Med Name: Med pass 2.0 take 60 mL by mouth 2 times daily     No current facility-administered medications for this visit.     No Known Allergies  REVIEW OF SYSTEMS (negative unless checked):   Cardiac:  []  Chest pain or chest pressure? []  Shortness of breath upon activity? []  Shortness of breath when lying flat? []  Irregular heart rhythm?  Vascular:  []  Pain in calf, thigh, or  hip brought on by walking? [] Pain in feet at night that wakes you up from your sleep? [] Blood clot in your veins? [] Leg swelling?  Pulmonary:  [] Oxygen at home? [] Productive cough? [] Wheezing?  Neurologic:  [x] Sudden weakness in arms or legs? [] Sudden numbness in arms or legs? [] Sudden onset of difficult speaking or slurred speech? [] Temporary loss of vision in one eye? [] Problems with dizziness?  Gastrointestinal:  [] Blood in stool? [] Vomited blood?  Genitourinary:  [] Burning when urinating? [] Blood in urine?  Psychiatric:  [] Major depression  Hematologic:  [] Bleeding problems? [] Problems with blood  clotting?  Dermatologic:  [x] Rashes or ulcers?  Constitutional:  [] Fever or chills?  Ear/Nose/Throat:  [] Change in hearing? [] Nose bleeds? [] Sore throat?  Musculoskeletal:  [] Back pain? [] Joint pain? [] Muscle pain?   For VQI Use Only   PRE-ADM LIVING Nursing home  AMB STATUS Bedridden  CAD Sx None  PRIOR CHF None  STRESS TEST No    Physical Examination     Vitals:   10/12/16 1110  BP: (!) 100/51  Pulse: (!) 51  Resp: 14  SpO2: 99%  Height: 5' 6" (1.676 m)   There is no height or weight on file to calculate BMI.  General Alert, O x 3, lipedemic fat distribution, Ill appearing  Head [place]/AT, Temporalis wasting  Ear/Nose/ Throat Hearing grossly intact, nares without erythema or drainage, oropharynx without Erythema or Exudate, Mallampati score: 2, edentulous  Eyes PERRL, EOMI,    Neck Supple, mid-line trachea,    Pulmonary Sym exp, decreased BS bilaterally,   Cardiac RRR, Nl S1, S2, no Murmurs, No rubs, No S3,S4  Vascular Vessel Right Left  Radial Faintly palpable Faintly palpable  Brachial Faintly palpable Faintly palpable  Carotid Palpable, No Bruit Palpable, No Bruit  Aorta Not palpable N/A  Femoral Not palpable due to pannus Not palpable due to pannus  Popliteal Not palpable Not palpable  PT Not palpable Not palpable  DP Not palpable Not palpable    Gastro- intestinal soft, non-distended, non-tender to palpation, No guarding or rebound, no HSM, no masses, no CVAT B, No palpable prominent aortic pulse,    Musculo- skeletal M/S 5/5 throughout upper extremities, no motor in legs, lipedema fat distribution in arms and legs, R knee and ankle fixed without significant movement, multiple ischemic toes with ulcers on distal phalanges, ulcer on rotated right medial malleolus/heel in a dependent position  Neurologic Limited Neuro exam, pt able to mostly cooperate but occasionally non-responsive, Pain and light touch intact in extremities, Motor exam as  listed above  Psychiatric Judgement difficult to discern due to limited interaction, Mood & affect difficult to determine  Dermatologic See M/S exam for extremity exam, No rashes otherwise noted, cannot evaluate sacrum and dependent position due to inability to reposition patient  Lymphatic  Palpable lymph nodes: None    Non-Invasive Vascular Imaging   ABI (10/12/2016)  R:   ABI: 0,   PT: cannot evaluate due to ankle rotation  DP: none  TBI:  No signal  L:   ABI: cannot be recorded  PT: dampened mono  DP: dampened mono  TBI: 0.24   Outside Studies/Documentation   10 pages of outside documents were reviewed including: nursing home paper work.   Medical Decision Making   Janira B Golay is a 81 y.o. female who presents with: BLE critical limb ischemia, R foot ulcers,   DM, CVA, chronic bedbound status, chronic kidney disease stage 2-3    This patient is chronically debilitated with likely severe tibial and femoropopliteal disease in both legs.  In general, I do not offer surgical revascularization in patient that do not walk as the potential risk of mortality cannot be justified in patients that do not walk.  The patient's daughter would like further evaluation of this patient's arterial system.    Given the limitation of non-invasive studies, I would agree with proceeding with angiography.  I doubt this patient can fit on the usual angiographic bed, so it would reason to do the procedure in the hybrid OR.  I discussed with the patient the natural history of critical limb ischemia: 25% require amputation in one year, 50% are able to maintain their limbs in one year, and 25-30% die in one year due to comorbidities.  Given the limb threatening status of this patient, I recommend an aggressive work up including proceeding with an: Aortogram, Right runoff and possible intervention R leg. I discussed with the patient the nature of angiographic procedures, especially the  limited patencies of any endovascular intervention. The patient is aware of that the risks of an angiographic procedure include but are not limited to: bleeding, infection, access site complications, embolization, rupture of treated vessel, dissection, possible need for emergent surgical intervention, and possible need for surgical procedures to treat the patient's pathology. The patient is aware of the risks and agrees to proceed.  The procedure is scheduled for: 6 SEPT 18.  I discussed in depth with the patient the nature of atherosclerosis, and emphasized the importance of maximal medical management including strict control of blood pressure, blood glucose, and lipid levels, antiplatelet agents, obtaining regular exercise, and cessation of smoking.  The patient is aware that without maximal medical management the underlying atherosclerotic disease process will progress, limiting the benefit of any interventions. The patient is currently not statin.  Need to obtain additional labwork prior to starting.  The patient is currently not on an anti-platelet. Patient will be started on ASA 81 mg PO daily  Thank you for allowing us to participate in this patient's care.   Leonides SakeBrian Adriell Polansky, MD, FACS [facility] [place] Office: [phone] Pager: [phone]  10/12/2016, 11:57 AM

## 2016-10-27 NOTE — Progress Notes (Signed)
Pt had 6.7 second pause on telemetry monitor, pt asymptomatic. Vitals obtained, attending MD, Dr. Imogene Burnhen, notified pacer pads placed on patient. Will continue to monitor patient. Geronimo Bootownes, Ravinder Lukehart R, RN

## 2016-10-27 NOTE — Interval H&P Note (Signed)
   History and Physical Update  The patient was interviewed and re-examined.  The patient's previous History and Physical has been reviewed and is unchanged from my consult.  There is no change in the plan of care: Ao, RRo, possible R leg intervention.   Pt was canceled last week for acute on chronic renal insufficiency.  Awaiting labs today.  Nursing home was told to hydrate overnight, which they did not.  I discussed with the patient the nature of angiographic procedures, especially the limited patencies of any endovascular intervention.    The patient is aware of that the risks of an angiographic procedure include but are not limited to: bleeding, infection, access site complications, renal failure, embolization, rupture of vessel, dissection, arteriovenous fistula, possible need for emergent surgical intervention, possible need for surgical procedures to treat the patient's pathology, anaphylactic reaction to contrast, and stroke and death.    The patient is aware of the risks and agrees to proceed.  Leonides SakeBrian Chen, MD, FACS [facility] [place] Office: [phone] Pager: [phone]  10/27/2016, 7:01 AM

## 2016-10-28 ENCOUNTER — Encounter (HOSPITAL_COMMUNITY)
Admission: AD | Disposition: A | Discharge status: Skilled Nursing Facility | Payer: Self-pay | Source: Ambulatory Visit | Attending: Vascular Surgery

## 2016-10-28 DIAGNOSIS — I70235 Atherosclerosis of native arteries of right leg with ulceration of other part of foot: Secondary | ICD-10-CM

## 2016-10-28 HISTORY — PX: PERIPHERAL VASCULAR INTERVENTION: CATH118257

## 2016-10-28 HISTORY — PX: ABDOMINAL AORTOGRAM W/LOWER EXTREMITY: CATH118223

## 2016-10-28 LAB — CBC
HCT: 35.1 % — ABNORMAL LOW (ref 36.0–46.0)
HEMOGLOBIN: 10.7 g/dL — AB (ref 12.0–15.0)
MCH: 25.4 pg — AB (ref 26.0–34.0)
MCHC: 30.5 g/dL (ref 30.0–36.0)
MCV: 83.2 fL (ref 78.0–100.0)
PLATELETS: 117 10*3/uL — AB (ref 150–400)
RBC: 4.22 MIL/uL (ref 3.87–5.11)
RDW: 20.2 % — ABNORMAL HIGH (ref 11.5–15.5)
WBC: 4.2 10*3/uL (ref 4.0–10.5)

## 2016-10-28 LAB — POCT ACTIVATED CLOTTING TIME
ACTIVATED CLOTTING TIME: 445 s
ACTIVATED CLOTTING TIME: 362 s
ACTIVATED CLOTTING TIME: 301 s
Activated Clotting Time: 406 seconds
Activated Clotting Time: 164 seconds

## 2016-10-28 LAB — BASIC METABOLIC PANEL
Anion gap: 7 (ref 5–15)
BUN: 61 mg/dL — AB (ref 6–20)
CHLORIDE: 117 mmol/L — AB (ref 101–111)
CO2: 17 mmol/L — ABNORMAL LOW (ref 22–32)
Calcium: 8.3 mg/dL — ABNORMAL LOW (ref 8.9–10.3)
Creatinine, Ser: 2.48 mg/dL — ABNORMAL HIGH (ref 0.44–1.00)
GFR, EST AFRICAN AMERICAN: 20 mL/min — AB (ref >60)
GFR, EST NON AFRICAN AMERICAN: 17 mL/min — AB (ref >60)
Glucose, Bld: 80 mg/dL (ref 65–99)
Potassium: 4.7 mmol/L (ref 3.5–5.1)
SODIUM: 141 mmol/L (ref 135–145)

## 2016-10-28 LAB — GLUCOSE, CAPILLARY
GLUCOSE-CAPILLARY: 53 mg/dL — AB (ref 65–99)
GLUCOSE-CAPILLARY: 91 mg/dL (ref 65–99)
GLUCOSE-CAPILLARY: 114 mg/dL — AB (ref 65–99)
Glucose-Capillary: 153 mg/dL — ABNORMAL HIGH (ref 65–99)
Glucose-Capillary: 110 mg/dL — ABNORMAL HIGH (ref 65–99)

## 2016-10-28 SURGERY — ABDOMINAL AORTOGRAM W/LOWER EXTREMITY
Anesthesia: LOCAL | Laterality: Right

## 2016-10-28 MED ORDER — ATORVASTATIN CALCIUM 10 MG PO TABS: 10.0000 mg | ORAL_TABLET | Freq: Every day | ORAL | 6 refills | Status: DC

## 2016-10-28 MED ORDER — HEPARIN SODIUM (PORCINE) 1000 UNIT/ML IJ SOLN
INTRAMUSCULAR | Status: AC
  Filled 2016-10-28: qty 1

## 2016-10-28 MED ORDER — HEPARIN SODIUM (PORCINE) 1000 UNIT/ML IJ SOLN
INTRAMUSCULAR | Status: DC | PRN
Start: 2016-10-28 — End: 2016-10-28
  Administered 2016-10-28: 10000 [IU] via INTRAVENOUS

## 2016-10-28 MED ORDER — CLOPIDOGREL BISULFATE 75 MG PO TABS
75.0000 mg | ORAL_TABLET | Freq: Every day | ORAL | Status: DC
  Administered 2016-10-29 – 2016-10-31 (×3): 75 mg via ORAL
  Filled 2016-10-28 (×3): qty 1

## 2016-10-28 MED ORDER — SODIUM CHLORIDE 0.9 % IV SOLN: INTRAVENOUS | Status: AC

## 2016-10-28 MED ORDER — SODIUM CHLORIDE 0.9% FLUSH: 3.0000 mL | INTRAVENOUS | Status: DC | PRN

## 2016-10-28 MED ORDER — LIDOCAINE HCL (PF) 1 % IJ SOLN
INTRAMUSCULAR | Status: DC | PRN
  Administered 2016-10-28: 10 mL

## 2016-10-28 MED ORDER — COLLAGENASE 250 UNIT/GM EX OINT
TOPICAL_OINTMENT | Freq: Every day | CUTANEOUS | Status: DC
  Administered 2016-10-29 – 2016-10-31 (×2): via TOPICAL
  Filled 2016-10-28 (×3): qty 30

## 2016-10-28 MED ORDER — HEPARIN (PORCINE) IN NACL 2-0.9 UNIT/ML-% IJ SOLN
INTRAMUSCULAR | Status: AC
  Filled 2016-10-28: qty 1000

## 2016-10-28 MED ORDER — IODIXANOL 320 MG/ML IV SOLN
INTRAVENOUS | Status: DC | PRN
  Administered 2016-10-28: 65 mL via INTRA_ARTERIAL

## 2016-10-28 MED ORDER — CLOPIDOGREL BISULFATE 75 MG PO TABS: 75.0000 mg | ORAL_TABLET | Freq: Every day | ORAL | 6 refills | Status: DC

## 2016-10-28 MED ORDER — LIDOCAINE HCL (PF) 1 % IJ SOLN
INTRAMUSCULAR | Status: AC
  Filled 2016-10-28: qty 30

## 2016-10-28 MED ORDER — DEXTROSE 50 % IV SOLN
INTRAVENOUS | Status: AC
  Administered 2016-10-28: 50 mL
  Filled 2016-10-28: qty 50

## 2016-10-28 MED ORDER — ATORVASTATIN CALCIUM 40 MG PO TABS: 40.0000 mg | ORAL_TABLET | Freq: Every day | ORAL | Status: DC

## 2016-10-28 MED ORDER — SODIUM BICARBONATE BOLUS VIA INFUSION
INTRAVENOUS | Status: AC
  Administered 2016-10-28: 75 meq via INTRAVENOUS
  Filled 2016-10-28: qty 1

## 2016-10-28 MED ORDER — ASPIRIN EC 325 MG PO TBEC
325.0000 mg | DELAYED_RELEASE_TABLET | Freq: Every day | ORAL | Status: DC
  Administered 2016-10-28 – 2016-10-31 (×4): 325 mg via ORAL
  Filled 2016-10-28 (×3): qty 1

## 2016-10-28 MED ORDER — HEPARIN (PORCINE) IN NACL 2-0.9 UNIT/ML-% IJ SOLN
INTRAMUSCULAR | Status: AC | PRN
  Administered 2016-10-28: 1000 mL via INTRA_ARTERIAL

## 2016-10-28 MED ORDER — ASPIRIN 325 MG PO TABS
ORAL_TABLET | ORAL | Status: AC
  Filled 2016-10-28: qty 1

## 2016-10-28 MED ORDER — CLOPIDOGREL BISULFATE 75 MG PO TABS
300.0000 mg | ORAL_TABLET | Freq: Once | ORAL | Status: AC
  Administered 2016-10-28: 300 mg via ORAL

## 2016-10-28 MED ORDER — SODIUM BICARBONATE 8.4 % IV SOLN
INTRAVENOUS | Status: AC
  Filled 2016-10-28 (×2): qty 500

## 2016-10-28 MED ORDER — PROTAMINE SULFATE 10 MG/ML IV SOLN
INTRAVENOUS | Status: AC
  Filled 2016-10-28: qty 5

## 2016-10-28 MED ORDER — ASPIRIN EC 81 MG PO TBEC: 81.0000 mg | DELAYED_RELEASE_TABLET | Freq: Every day | ORAL | Status: DC

## 2016-10-28 MED ORDER — CLOPIDOGREL BISULFATE 300 MG PO TABS
ORAL_TABLET | ORAL | Status: AC
  Filled 2016-10-28: qty 1

## 2016-10-28 MED ORDER — SODIUM CHLORIDE 0.9% FLUSH
3.0000 mL | Freq: Two times a day (BID) | INTRAVENOUS | Status: DC
  Administered 2016-10-28: 3 mL via INTRAVENOUS

## 2016-10-28 MED ORDER — SODIUM CHLORIDE 0.9 % IV SOLN: 250.0000 mL | INTRAVENOUS | Status: DC | PRN

## 2016-10-28 MED ORDER — PROTAMINE SULFATE 10 MG/ML IV SOLN
50.0000 mg | Freq: Once | INTRAVENOUS | Status: AC
  Administered 2016-10-28: 40 mg via INTRAVENOUS
  Filled 2016-10-28: qty 5

## 2016-10-28 SURGICAL SUPPLY — 25 items
BAG SNAP BAND KOVER 36X36 (MISCELLANEOUS) ×1 IMPLANT
BALLN MUSTANG 6X200X135 (BALLOONS) ×3
BALLOON MUSTANG 6X200X135 (BALLOONS) IMPLANT
CATH ANGIO 5F PIGTAIL 65CM (CATHETERS) ×1 IMPLANT
CATH CROSS OVER TEMPO 5F (CATHETERS) ×1 IMPLANT
CATH QUICKCROSS .035X135CM (MICROCATHETER) ×1 IMPLANT
CATH STRAIGHT 5FR 65CM (CATHETERS) ×1 IMPLANT
COVER DOME SNAP 22 D (MISCELLANEOUS) ×1 IMPLANT
DEVICE CONTINUOUS FLUSH (MISCELLANEOUS) ×1 IMPLANT
FILTER CO2 0.2 MICRON (VASCULAR PRODUCTS) ×2 IMPLANT
GUIDEWIRE ANGLED .035X260CM (WIRE) ×1 IMPLANT
KIT ENCORE 26 ADVANTAGE (KITS) ×1 IMPLANT
KIT PV (KITS) ×3 IMPLANT
RESERVOIR CO2 (VASCULAR PRODUCTS) ×1 IMPLANT
SET FLUSH CO2 (MISCELLANEOUS) ×1 IMPLANT
SHEATH PINNACLE 5F 10CM (SHEATH) ×1 IMPLANT
SHEATH PINNACLE ST 7F 45CM (SHEATH) ×1 IMPLANT
STENT INNOVA 6X150X130 (Permanent Stent) ×2 IMPLANT
STENT INNOVA 6X40X130 (Permanent Stent) ×1 IMPLANT
SYR MEDRAD MARK V 150ML (SYRINGE) ×3 IMPLANT
TRANSDUCER W/STOPCOCK (MISCELLANEOUS) ×3 IMPLANT
TRAY PV CATH (CUSTOM PROCEDURE TRAY) ×3 IMPLANT
TUBING CIL FLEX 10 FLL-RA (TUBING) ×1 IMPLANT
WIRE HITORQ VERSACORE ST 145CM (WIRE) ×1 IMPLANT
WIRE ROSEN-J .035X260CM (WIRE) ×1 IMPLANT

## 2016-10-28 NOTE — Discharge Summary (Signed)
Discharge Summary    Sylvia Medina 1936/02/02 81 y.o. female  [id number]  Admission Date: 10/27/2016  Discharge Date: 10/31/2016  Physician: Fransisco Hertz, MD  Admission Diagnosis: Atherosclerosis of artery of extremity with ulceration (HCC) [I70.299, L97.909]   HPI:   This is a 81 y.o. female who presents with chief complaint: right foot ulcers.  The patient has limited communication so most of the history is from the daughter.  Onset of symptoms occurred >6 months ago with development of ulcers on R toes.  Daughter thinks over the last month the right heel has developed a wound.  The patient is getting wound care for the last 6 months without resolution of toe ulcers.  Reported not fever or chills.  Limited drainage from right foot ulcers.  Pain is described as sharp, severity 5-10/10, and associated with manipulating the right foot.  Patient has attempted to treat this pain with wound care.    This patient is bed bound due to stroke.  She does not get out bed on her own.  Atherosclerotic risk factors include: DM 2.  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   She was on bedrest for 4 hours after her sheath was pulled.  It was late in the evening and she was kept another day.    On POD 1 prior to discharge, the RN Called Dr. Arbie Cookey and updated him on pt's BP trend in the low 80's and going as low as 67 SBP. BP was 120/60 at 8 AM. Pt had metoprolol 2 hours prior to EMS transport arrival  with  BP at 120/60. Order received to hold metoprolol and cancel discharge. Will plan to d/c in AM when BP more stable per Dr. Arbie Cookey.  On POD 2, per Dr. Arbie Cookey, patient was to return to nursing home yesterday but was noted to have blood pressure in the 80 range. Some better today on holding antihypertensives and the blood pressure ranging from 90-100 systolic. Very debilitated state but appears to be asymptomatic from this. Discussed with daughter present. She would be comfortable with 1 more day of ulceration which I feel is appropriate. Will plan discharge back to skilled nursing facility tomorrow.  On POD 3, the pt is doing well.  Her blood pressure is still soft and her antihypertensives are being held.  She is discharged back to the SNF.  The remainder of the hospital course consisted of increasing mobilization and increasing intake of solids without difficulty.  CBC    Component Value Date/Time   WBC 4.2 10/28/2016 0309   RBC 4.22 10/28/2016 0309   HGB 10.7 (L) 10/28/2016 0309   HCT 35.1 (L) 10/28/2016 0309   PLT 117 (L) 10/28/2016 0309   MCV 83.2 10/28/2016 0309   MCH 25.4 (L) 10/28/2016 0309   MCHC 30.5 10/28/2016 0309   RDW 20.2 (H) 10/28/2016 0309   LYMPHSABS 2.0 06/06/2016 1257   MONOABS 0.9 06/06/2016 1257   EOSABS 0.1 06/06/2016 1257   BASOSABS 0.0 06/06/2016 1257  BMET    Component Value Date/Time   NA 141 10/28/2016 0309   NA 149 (A) 06/14/2016   K 4.7 10/28/2016 0309   CL 117 (H) 10/28/2016 0309   CO2 17 (L) 10/28/2016 0309   GLUCOSE 80 10/28/2016 0309   BUN 61 (H) 10/28/2016 0309   BUN 48 (A) 06/14/2016   CREATININE 2.48 (H) 10/28/2016 0309   CALCIUM 8.3 (L) 10/28/2016 0309   GFRNONAA 17 (L) 10/28/2016 0309   GFRAA 20 (L) 10/28/2016 0309        Discharge Diagnosis:  Atherosclerosis of artery of extremity with ulceration (HCC) [I70.299, L97.909]  Secondary Diagnosis: Patient Active Problem List   Diagnosis Date Noted  . Atherosclerosis of native  arteries of the extremities with ulceration (HCC) 10/27/2016  . Atherosclerosis of artery of extremity with ulceration (HCC) 10/27/2016  . CKD (chronic kidney disease) stage 2, GFR 60-89 ml/min 06/15/2016  . AKI (acute kidney injury) (HCC) 06/06/2016  . Acute lower UTI 06/06/2016  . Acute cystitis with hematuria   . Hypernatremia 08/31/2015  . Chronic constipation 07/27/2015  . Protein-calorie malnutrition (HCC) 07/27/2015  . Alzheimer's disease 09/18/2014  . Hyperlipidemia 09/18/2014  . Anemia of chronic disease 09/18/2014  . Constipation 11/17/2013  . Essential hypertension, benign 10/07/2013  . Thyroiditis, subacute 04/14/2013  . Dementia without behavioral disturbance 02/10/2013  . Vitamin D deficiency 07/04/2012  . Depression 07/04/2012  . Diabetes mellitus type 2, diet-controlled (HCC) 07/04/2012  . CVA (cerebral vascular accident) (HCC) 07/04/2012  . Cardiac arrhythmia 07/04/2012   Past Medical History:  Diagnosis Date  . Anemia 07/04/2012  . Cardiac arrhythmia 07/04/2012  . CVA (cerebral vascular accident) (HCC) 07/04/2012  . Dementia 07/04/2012  . Diabetes mellitus type 2, diet-controlled (HCC) 07/04/2012  . Unspecified constipation 07/04/2012  . Unspecified hypothyroidism 07/04/2012  . Unspecified vitamin D deficiency 07/04/2012     Allergies as of 10/28/2016   No Known Allergies     Medication List    TAKE these medications   aspirin EC 81 MG tablet Take 1 tablet (81 mg total) by mouth daily.   atorvastatin 10 MG tablet Commonly known as:  LIPITOR Take 1 tablet (10 mg total) by mouth daily.   Cholecalciferol 1000 units tablet Take 1 tablet (1,000 Units total) by mouth daily.   clopidogrel 75 MG tablet Commonly known as:  PLAVIX Take 1 tablet (75 mg total) by mouth daily with breakfast.   feeding supplement (PRO-STAT SUGAR FREE 64) Liqd Take 30 mLs by mouth 2 (two) times daily.   ferrous sulfate 325 (65 FE) MG tablet Take 325 mg by mouth daily with  breakfast.   hydrochlorothiazide 12.5 MG tablet Commonly known as:  HYDRODIURIL Take 12.5 mg by mouth daily. Hold for SBP <110   metoprolol tartrate 50 MG tablet Commonly known as:  LOPRESSOR Take 50 mg by mouth 2 (two) times daily. Hold for SBP < 110 and HR < 60   multivitamin tablet Take 1 tablet by mouth daily.   polyethylene glycol powder powder Commonly known as:  GLYCOLAX/MIRALAX Take 17 g by mouth daily. Mix in 4-8 ounces of Orange Juice and take by mouth daily for constipation.   rivastigmine 13.3 MG/24HR Commonly known as:  EXELON Place 1 patch (13.3 mg total) onto the skin daily.            Discharge Care Instructions        Start     Ordered   10/29/16 0000  clopidogrel (PLAVIX) 75 MG tablet  Daily with breakfast    Question:  Supervising Provider  Answer:  Sherren Kerns   10/28/16 1644   10/28/16 0000  aspirin EC 81 MG tablet  Daily     10/28/16 1644   10/28/16 0000  atorvastatin (LIPITOR) 10 MG tablet  Daily    Question:  Supervising Provider  Answer:  Sherren Kerns   10/28/16 1644      Prescriptions given: 1.  Aspirin  daily 2.  Lipitor  daily #30 6RF 3.  Plavix  daily #30 6RF  Instructions:   [facility]  Discharge Instructions  Lower Extremity Angiogram; Angioplasty/Stenting  Please refer to the following instructions for your post-procedure care. Your surgeon or physician assistant will discuss any changes with you.  Activity  Avoid lifting more than 8 pounds (1 gallons of milk) for 72 hours (3 days) after your procedure. You may walk as much as you can tolerate. It's OK to drive after 72 hours.  Bathing/Showering  You may shower the day after your procedure. If you have a bandage, you may remove it at 24- 48 hours. Clean your incision site with mild soap and water. Pat the area dry with a clean towel.  Diet  Resume your pre-procedure diet. There are no special food restrictions  following this procedure. All patients with peripheral vascular disease should follow a low fat/low cholesterol diet. In order to heal from your surgery, it is CRITICAL to get adequate nutrition. Your body requires vitamins, minerals, and protein. Vegetables are the best source of vitamins and minerals. Vegetables also provide the perfect balance of protein. Processed food has little nutritional value, so try to avoid this.  Medications  Resume taking all of your medications unless your doctor tells you not to. If your incision is causing pain, you may take over-the-counter pain relievers such as acetaminophen (Tylenol)  Please hold Metoprolol and Hydralazine until her blood pressure can tolerate adding these meds back to her regimen.  Systolic > 130 and HR > 60.    Follow Up  Follow up will be arranged at the time of your procedure. You may have an office visit scheduled or may be scheduled for surgery. Ask your surgeon if you have any questions.  Please call us immediately for any of the following conditions: .Severe or worsening pain your legs or feet at rest or with walking. .Increased pain, redness, drainage at your groin puncture site. .Fever of 101 degrees or higher. .If you have any mild or slow bleeding from your puncture site: lie down, apply firm constant pressure over the area with a piece of gauze or a clean wash cloth for 30 minutes- no peeking!, call 911 right away if you are still bleeding after 30 minutes, or if the bleeding is heavy and unmanageable.  Reduce your risk factors of vascular disease:  Stop smoking. If you would like help call [facility] at 1-800-QUIT-NOW ((786)714-5124) or [place] at 330-094-5068. Manage your cholesterol Maintain a desired weight Control your diabetes Keep your blood pressure down  If you have any questions, please call the office at [phone]  Disposition: SNF  Patient's condition: is Good  Follow up: 1. Dr. Imogene Burn in 4 weeks  with ABI's   Doreatha Massed, PA-C [facility] [phone] 10/28/2016  4:44 PM  Addendum  Pt underwent endovascular recannulation of right femoropopliteal artery with stents.  He post-operative discharge was delayed by slow resolution of anticoagulation needed for the procedure.  She was  keep subsequently overnight.  Her nursing home problems getting the patient back to their facilities due to weather related issues.  Once she was slated to be returned, she develops some hypotension felt related to her medication.  Her medication was adjusted and finally patient was discharged back to her nursing on 10/31/16.  Her poor functional status and general fragility demonstrated during this admission reiterates my opinion that she is NOT a surgical candidate for revascularization.  She will follow up in the office in 4 weeks.   Leonides Sake, MD, FACS [facility] [place] Office: [phone] Pager: [phone]  10/31/2016, 11:40 AM

## 2016-10-28 NOTE — Progress Notes (Signed)
CSW notified of patient's impending discharge this evening. Unable to reach patient's SNF to coordinate discharge this evening. Nurse please call CSW coverage on 9/15 a.m. for discharge back to [facility]. CSW unable to leave CSW handoff note as patient not currently assigned to room.  Abigail Butts, LCSWA [phone]

## 2016-10-28 NOTE — Interval H&P Note (Signed)
History and Physical Interval Note:  10/28/2016 11:24 AM  Sylvia Medina  has presented today for surgery, with the diagnosis of PVD w/ RLE ulcers  The various methods of treatment have been discussed with the patient and family. After consideration of risks, benefits and other options for treatment, the patient has consented to  Procedure(s): ABDOMINAL AORTOGRAM W/LOWER EXTREMITY (N/A) as a surgical intervention .  The patient's history has been reviewed, patient examined, no change in status, stable for surgery.  I have reviewed the patient's chart and labs.  Questions were answered to the patient's satisfaction.     Fabienne Bruns

## 2016-10-28 NOTE — Progress Notes (Signed)
Pt came up from cath lab. L groin a level 0. Will cont to monitor pt.

## 2016-10-28 NOTE — Progress Notes (Signed)
Site area: left groin fa sheath pulled and pressure held by Virl Diamond Site Prior to Removal:  Level 0 Pressure Applied For: 20 minutes Manual:   yes Patient Status During Pull:  stable Post Pull Site:  Level 0 Post Pull Instructions Given:  yes Post Pull Pulses Present: dopplered Dressing Applied:  Gauze and tegaderm Bedrest begins @  1805 Comments:

## 2016-10-28 NOTE — Progress Notes (Signed)
   Daily Progress Note   Event overnight noted.  Cr only improved to 2.5 with hydration overnight. Essentially patient has progressed in to CKD Stage IV.   Proceed with Aortogram with right leg runoff with carbon dioxide and limited contrast use  Due to renal constraint, doubt any intervention will be possible. I have previously discussed with the patient and daughter the nature of angiographic procedures, especially the limited patencies of any endovascular intervention.   They are aware of that the risks of an angiographic procedure include but are not limited to: bleeding, infection, access site complications, renal failure, embolization, rupture of vessel, dissection, arteriovenous fistula, possible need for emergent surgical intervention, possible need for surgical procedures to treat the patient's pathology, anaphylactic reaction to contrast, and stroke and death.   The patient and daughter are aware of the risks and agrees to proceed. Will plan on transfering pt back to nursing home once stable for discharge (4 hours after sheath pull)   Laboratory   CBC CBC Latest Ref Rng & Units 10/28/2016 10/27/2016 10/27/2016  WBC 4.0 - 10.5 K/uL 4.2 6.2 -  Hemoglobin 12.0 - 15.0 g/dL 10.7(L) 10.7(L) 13.6  Hematocrit 36.0 - 46.0 % 35.1(L) 34.6(L) 40.0  Platelets 150 - 400 K/uL 117(L) 127(L) -    BMET    Component Value Date/Time   NA 141 10/28/2016 0309   NA 149 (A) 06/14/2016   K 4.7 10/28/2016 0309   CL 117 (H) 10/28/2016 0309   CO2 17 (L) 10/28/2016 0309   GLUCOSE 80 10/28/2016 0309   BUN 61 (H) 10/28/2016 0309   BUN 48 (A) 06/14/2016   CREATININE 2.48 (H) 10/28/2016 0309   CALCIUM 8.3 (L) 10/28/2016 0309   GFRNONAA 17 (L) 10/28/2016 0309   GFRAA 20 (L) 10/28/2016 0309     Leonides Sake, MD, FACS [facility] Office: [phone] Pager: [phone]  10/28/2016, 6:52 AM

## 2016-10-28 NOTE — Consult Note (Signed)
WOC Nurse wound consult note Reason for Consult: left posterior thigh full thickness, 3 left lateral calf stage II, left lateral ankle unstageable, groin intertriginous damage Wound type: pressure, MASD, trauma? Wound in area not related to pressure Pressure Injury POA: Yes Measurement: left posterior thigh 2cm x 4cm x 0.1cm full thickness with 100% pink granulating moist  bed, scant drainage, no odor, not in area generally related to pressure. Left lateral calf has 3 small ulcers in a circle area each measures 1cm x 0.5cm x 0.1 stage II pink wound beds, no drainage, no odor, surrounding area intact. Left lateral malleolus 3cm x 1.5cm 0.1cm unstageable with 70% dark slough, 30% pink non granulating, scant drainage, no odor, surrounding area intact. Groins with MASD due to overlapping abd pannus. Wound bed:see above Drainage (amount, consistency, odor) see above Periwound:see above Dressing procedure/placement/frequency: I have provided nurses with orders for InterDry Ag+ for bilateral groins, foam dressing to posterior thigh wound, foam dressing to lateral calf wounds, Santyl to left lateral malleolus. Pt to have angiogram today and if no treatment advisable pt will be going back to skilled nursing facility later today. We will not follow, but will remain available to this patient, to nursing, and the medical and/or surgical teams.  Please re-consult if we need to assist further.   Barnett Hatter, RN-C, WTA-C Wound Treatment Associate

## 2016-10-28 NOTE — Progress Notes (Signed)
Hypoglycemic Event  CBG: 53  Treatment: 1 amp of D50  Symptoms: none  Follow-up CBG: Time: 0836 CBG Result: 153  Possible Reasons for Event:pt npo  Comments/MD notified:    Docia Chuck

## 2016-10-28 NOTE — Progress Notes (Signed)
New external catheter placed and connected to 65mm wall suction

## 2016-10-28 NOTE — Op Note (Signed)
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 The patient was placed in supine position on the Angio table. Both groins were prepped and draped in usual sterile fashion. Local anesthesia was infiltrated over the left common femoral artery. Ultrasound was used to identify the left common femoral artery and femoral bifurcation. An introducer needle was used to cannulate the left common femoral artery under ultrasound guidance.  An 035 versacore wires and threaded up in the abdominal aorta and fluoroscopic guidance. A 5 French sheath was placed over the guidewire into the right common femoral artery. This was thoroughly flushed with heparinized saline. A 5 [place] a catheter was then placed over the guidewire into the abdominal aorta and abdominal aortogram was obtained with carbon dioxide contrast. The infrarenal abdominal aorta patent. The left and right common iliac arteries are patent. The external iliac arteries are widely patent. The internal iliac arteries are not well visualized the ureter quite small or occluded.  At this point the pectoral catheter was removed over a guidewire and exchanged for a 5 [place] crossover catheter the 035 versacore wire was then advanced in the distal right external iliac artery. The crossover catheter was removed and exchanged for a 5 French straight catheter. Carbon dioxide antegrade was then obtained of the right lower extremity. The right  common femoral artery is widely patent. The right profunda femoris is patent. The right superficial femoral artery occludes at the mid thigh. There is then an [place] of popliteal artery which reconstitutes just below the adductor hiatus. Then this occludes at the knee joint. The below-knee popliteal artery then reconstitutes and gives off one tibial vessel to the foot which appears to be the peroneal artery. A contrast angiogram was performed using standard contrast material below the knee to make sure that we had adequate views of the runoff circulation. At this point it was decided to intervene on the SFA popliteal lesion. The versacore wire was advanced down into the distal right superficial femoral artery. The 5 French sheath was removed and exchanged for a 7 [place] destination sheath was advanced down into the proximal right superficial femoral artery. The patient was given 10,000 units of intravenous heparin. ACT was confirmed to be greater than 250. I then advanced an 035 angled Glidewire and an 035 quick cross catheter down into the superficial femoral artery. Using a reflux the wire push technique was able to cross the lesion and regain entry to the below-knee popliteal artery. This was confirmed with contrast angiogram. An 035 Rosen wire was then advanced through the quick cross catheter into the distal below-knee popliteal artery. I then proceeded to place a 6 x 150 overlapping with an additional 6 x 150 and a 6 x 40 self expanding stent. This was then postdilated with a 6 x 180 balloon with 2 overlapping inflations to nominal pressure. Completion angiogram showed wide patency of the stent and below-knee popliteal artery with one-vessel runoff via the peroneal artery. At this point the guidewire was pulled back in the sheath  pulled back into the left hemipelvis. The sheath was left in place to be pulled after the patient's ACT is less than 175. Total contrast was 65 cc.  The patient tolerated the  procedure well and there were no complications. The patient was taken to the holding area in stable condition.  Operative management: The patient will be scheduled for follow-up with a right lower extremity arterial duplex and see Dr. Imogene Burn in 1 month. She will need to be maintained on Plavix and aspirin.  Fabienne Bruns, MD [facility] Office: [phone] Pager: [phone]

## 2016-10-28 NOTE — Progress Notes (Signed)
Pt has a saved strip of 2nd degree HB type 1. Pt also had multiple pauses this morning.Pt currently resting in bed. Dr Early made aware.

## 2016-10-29 ENCOUNTER — Encounter (HOSPITAL_COMMUNITY): Payer: Self-pay | Admitting: General Practice

## 2016-10-29 LAB — BASIC METABOLIC PANEL
ANION GAP: 4 — AB (ref 5–15)
BUN: 49 mg/dL — AB (ref 6–20)
CALCIUM: 8.1 mg/dL — AB (ref 8.9–10.3)
CO2: 22 mmol/L (ref 22–32)
CREATININE: 2.26 mg/dL — AB (ref 0.44–1.00)
Chloride: 118 mmol/L — ABNORMAL HIGH (ref 101–111)
GFR calc Af Amer: 23 mL/min — ABNORMAL LOW (ref >60)
GFR, EST NON AFRICAN AMERICAN: 19 mL/min — AB (ref >60)
GLUCOSE: 58 mg/dL — AB (ref 65–99)
POTASSIUM: 3.5 mmol/L (ref 3.5–5.1)
SODIUM: 144 mmol/L (ref 135–145)

## 2016-10-29 LAB — GLUCOSE, CAPILLARY
GLUCOSE-CAPILLARY: 92 mg/dL (ref 65–99)
Glucose-Capillary: 57 mg/dL — ABNORMAL LOW (ref 65–99)

## 2016-10-29 LAB — CBC
HCT: 32.8 % — ABNORMAL LOW (ref 36.0–46.0)
Hemoglobin: 9.9 g/dL — ABNORMAL LOW (ref 12.0–15.0)
MCH: 24.5 pg — ABNORMAL LOW (ref 26.0–34.0)
MCHC: 30.2 g/dL (ref 30.0–36.0)
MCV: 81.2 fL (ref 78.0–100.0)
PLATELETS: 124 10*3/uL — AB (ref 150–400)
RBC: 4.04 MIL/uL (ref 3.87–5.11)
RDW: 19.5 % — AB (ref 11.5–15.5)
WBC: 4 10*3/uL (ref 4.0–10.5)

## 2016-10-29 MED ORDER — INFLUENZA VAC SPLIT QUAD 0.5 ML IM SUSY
0.5000 mL | PREFILLED_SYRINGE | INTRAMUSCULAR | Status: AC
  Administered 2016-10-29: 0.5 mL via INTRAMUSCULAR

## 2016-10-29 NOTE — Progress Notes (Signed)
Pt not given 2200 metoprolol 50 mg d/t HR 30's-50's, sustaining in 40's. Archie Endo, RN.

## 2016-10-29 NOTE — Clinical Social Work Note (Signed)
Clinical Social Work Assessment  Patient Details  Name: Sylvia Medina MRN: [id number] Date of Birth: 1935/10/29  Date of referral:  10/29/16               Reason for consult:  Discharge Planning                Permission sought to share information with:  Family Supports, Case Manager Permission granted to share information::  Yes, Verbal Permission Granted  Name::        Agency::     Relationship::     Contact Information:     Housing/Transportation Living arrangements for the past 2 months:  Skilled Building surveyor of Information:  Adult Children Patient Interpreter Needed:  None Criminal Activity/Legal Involvement Pertinent to Current Situation/Hospitalization:  No - Comment as needed Significant Relationships:  Adult Children Lives with:  Other (Comment) (SNF - [facility]) Do you feel safe going back to the place where you live?  Yes Need for family participation in patient care:  Yes (Comment)  Care giving concerns:  Pt is a resident @ [address] and will return upon DC.    Social Worker assessment / plan:  CSW spoke to pt's Dtr via telephone who confirmed pt has been a resident @ [facility] and will return. Pt is bed bound, came in for a angiographic procedure.   Employment status:  Retired Health and safety inspector:  Medicare PT Recommendations:  Not assessed at this time Information / Referral to community resources:  Skilled Nursing Facility  Patient/Family's Response to care:  Pt is non-verbal, however dtr is very appreciative of care pt has received and CSW's involvement in coordinating plan to get pt back to SNF.  Patient/Family's Understanding of and Emotional Response to Diagnosis, Current Treatment, and Prognosis:  Dtr totally aware of pt's dx/treatment and prognosis, dtr aware of pt's baseline,pt has been bed bound for a long time.  Emotional Assessment Appearance:  Appears stated age Attitude/Demeanor/Rapport:  Unable to Assess Affect (typically  observed):  Unable to Assess Orientation:  Fluctuating Orientation (Suspected and/or reported Sundowners) Alcohol / Substance use:  Not Applicable Psych involvement (Current and /or in the community):  No (Comment)  Discharge Needs  Concerns to be addressed:    Readmission within the last 30 days:  No Current discharge risk:  None Barriers to Discharge:  Continued Medical Work up   [place], [place] B, LCSWA 10/29/2016, 10:00 AM

## 2016-10-29 NOTE — Clinical Social Work Note (Signed)
CSW notified by family that pt has not arrived to facility, per CN pt's BP low and MD will keep pt another night. CSW notified family and facility.  Following for DC planning.  Jaqualin Serpa B. Gean Quint Clinical Social Work Dept Weekend Social Worker [phone] 12:37 PM

## 2016-10-29 NOTE — Progress Notes (Addendum)
Called Dr. Arbie Cookey and updated him on pt's BP trend in the low 80's and going as low as 67 SBP. BP was 120/60 at 8 AM. Pt had metoprolol 2 hours prior to EMS transport arrival  with BP at 120/60. Order received to hold metoprolol and cancel discharge. Will plan to d/c in AM when BP more stable per Dr. Arbie Cookey.

## 2016-10-29 NOTE — Clinical Social Work Note (Signed)
Clinical Social Worker facilitated patient discharge including contacting patient family [place]) and facility [facility]) to confirm patient discharge plans.  Clinical information faxed to facility and family agreeable with plan.  CSW arranged ambulance transport via [facility] to [facility]. RN to call report to [phone] prior to discharge.  Clinical Social Worker will sign off for now as social work intervention is no longer needed. Please consult [place] again if new need arises.  Andriy Sherk B. Gean Quint Clinical Social Work Dept Weekend Social Worker [phone] 9:48 AM

## 2016-10-29 NOTE — Care Management Note (Signed)
Case Management Note  Patient Details  Name: LYNNSEY BARBARA MRN: [id number] Date of Birth: 1935/09/07  Subjective/Objective:   Anticipate return to [facility]. Will defer discharge planning  to CSW                 Action/Plan:CM will sign off for now but will be available should additional discharge needs arise or disposition change.    Expected Discharge Date:  10/29/16               Expected Discharge Plan:  Skilled Nursing Facility  In-House Referral:  Clinical Social Work  Discharge planning Services  CM Consult  Post Acute Care Choice:  NA Choice offered to:     DME Arranged:    DME Agency:     HH Arranged:    HH Agency:     Status of Service:  Completed, signed off  If discussed at [employer], dates discussed:    Additional Comments:  Yvone Neu, RN 10/29/2016, 9:02 AM

## 2016-10-30 DIAGNOSIS — E11621 Type 2 diabetes mellitus with foot ulcer: Secondary | ICD-10-CM | POA: Diagnosis present

## 2016-10-30 DIAGNOSIS — R5381 Other malaise: Secondary | ICD-10-CM | POA: Diagnosis present

## 2016-10-30 DIAGNOSIS — Z6841 Body Mass Index (BMI) 40.0 and over, adult: Secondary | ICD-10-CM | POA: Diagnosis not present

## 2016-10-30 DIAGNOSIS — Z7401 Bed confinement status: Secondary | ICD-10-CM | POA: Diagnosis not present

## 2016-10-30 DIAGNOSIS — I952 Hypotension due to drugs: Secondary | ICD-10-CM | POA: Diagnosis not present

## 2016-10-30 DIAGNOSIS — Z23 Encounter for immunization: Secondary | ICD-10-CM | POA: Diagnosis present

## 2016-10-30 DIAGNOSIS — E1151 Type 2 diabetes mellitus with diabetic peripheral angiopathy without gangrene: Secondary | ICD-10-CM | POA: Diagnosis present

## 2016-10-30 DIAGNOSIS — I7092 Chronic total occlusion of artery of the extremities: Secondary | ICD-10-CM | POA: Diagnosis present

## 2016-10-30 DIAGNOSIS — I70292 Other atherosclerosis of native arteries of extremities, left leg: Secondary | ICD-10-CM | POA: Diagnosis present

## 2016-10-30 DIAGNOSIS — Y92230 Patient room in hospital as the place of occurrence of the external cause: Secondary | ICD-10-CM | POA: Diagnosis not present

## 2016-10-30 DIAGNOSIS — Z79899 Other long term (current) drug therapy: Secondary | ICD-10-CM | POA: Diagnosis not present

## 2016-10-30 DIAGNOSIS — I129 Hypertensive chronic kidney disease with stage 1 through stage 4 chronic kidney disease, or unspecified chronic kidney disease: Secondary | ICD-10-CM | POA: Diagnosis present

## 2016-10-30 DIAGNOSIS — I70235 Atherosclerosis of native arteries of right leg with ulceration of other part of foot: Secondary | ICD-10-CM | POA: Diagnosis present

## 2016-10-30 DIAGNOSIS — E1122 Type 2 diabetes mellitus with diabetic chronic kidney disease: Secondary | ICD-10-CM | POA: Diagnosis present

## 2016-10-30 DIAGNOSIS — T447X5A Adverse effect of beta-adrenoreceptor antagonists, initial encounter: Secondary | ICD-10-CM | POA: Diagnosis present

## 2016-10-30 DIAGNOSIS — I69398 Other sequelae of cerebral infarction: Secondary | ICD-10-CM | POA: Diagnosis not present

## 2016-10-30 DIAGNOSIS — N184 Chronic kidney disease, stage 4 (severe): Secondary | ICD-10-CM | POA: Diagnosis present

## 2016-10-30 DIAGNOSIS — L97519 Non-pressure chronic ulcer of other part of right foot with unspecified severity: Secondary | ICD-10-CM | POA: Diagnosis present

## 2016-10-30 LAB — BASIC METABOLIC PANEL
ANION GAP: 3 — AB (ref 5–15)
BUN: 49 mg/dL — ABNORMAL HIGH (ref 6–20)
CO2: 22 mmol/L (ref 22–32)
Calcium: 7.7 mg/dL — ABNORMAL LOW (ref 8.9–10.3)
Chloride: 118 mmol/L — ABNORMAL HIGH (ref 101–111)
Creatinine, Ser: 2.55 mg/dL — ABNORMAL HIGH (ref 0.44–1.00)
GFR calc Af Amer: 19 mL/min — ABNORMAL LOW (ref >60)
GFR, EST NON AFRICAN AMERICAN: 17 mL/min — AB (ref >60)
Glucose, Bld: 62 mg/dL — ABNORMAL LOW (ref 65–99)
POTASSIUM: 3.7 mmol/L (ref 3.5–5.1)
SODIUM: 143 mmol/L (ref 135–145)

## 2016-10-30 LAB — GLUCOSE, CAPILLARY: GLUCOSE-CAPILLARY: 64 mg/dL — AB (ref 65–99)

## 2016-10-30 NOTE — Progress Notes (Signed)
Subjective: Interval History: none.. Chronically ill patient. Daughter is present with her today.  Objective: Vital signs in last 24 hours: Temp:  [97.9 F (36.6 C)-98.1 F (36.7 C)] 98.1 F (36.7 C) (09/16 0929) Pulse Rate:  [60-65] 65 (09/16 1150) Resp:  [16] 16 (09/16 0511) BP: (83-105)/(36-85) 94/69 (09/16 1150) SpO2:  [93 %-100 %] 99 % (09/16 1149) Weight:  [276 lb (125.2 kg)] 276 lb (125.2 kg) (09/16 0511)  Intake/Output from previous day: 09/15 0701 - 09/16 0700 In: 1822.5 [I.V.:1822.5] Out: 150 [Urine:150] Intake/Output this shift: No intake/output data recorded.  Morbidly obese but no evidence of groin complications from arteriogram  Lab Results:  Recent Labs  10/28/16 0309 10/29/16 0345  WBC 4.2 4.0  HGB 10.7* 9.9*  HCT 35.1* 32.8*  PLT 117* 124*   BMET  Recent Labs  10/29/16 0345 10/30/16 0330  NA 144 143  K 3.5 3.7  CL 118* 118*  CO2 22 22  GLUCOSE 58* 62*  BUN 49* 49*  CREATININE 2.26* 2.55*  CALCIUM 8.1* 7.7*    Studies/Results: No results found. Anti-infectives: Anti-infectives    None      Assessment/Plan: s/p Procedure(s) with comments: ABDOMINAL AORTOGRAM W/LOWER EXTREMITY (N/A) PERIPHERAL VASCULAR INTERVENTION (Right) - Right SFA and Popliteal Patient was to return to nursing home yesterday but was noted to have blood pressure in the 80 range. Some better today on holding antihypertensives and the blood pressure ranging from 9200. Very debilitated state but appears to be asymptomatic from this. Discussed with daughter present. She would be comfortable with 1 more day of ulceration which I feel is appropriate. Will plan discharge back to skilled nursing facility tomorrow   LOS: 0 days   Jini Horiuchi 10/30/2016, 1:32 PM

## 2016-10-31 ENCOUNTER — Encounter (HOSPITAL_COMMUNITY): Payer: Self-pay

## 2016-10-31 NOTE — Discharge Instructions (Signed)
° °  [facility]  Discharge Instructions  Lower Extremity Angiogram; Angioplasty/Stenting  Please refer to the following instructions for your post-procedure care. Your surgeon or physician assistant will discuss any changes with you.  Activity  Avoid lifting more than 8 pounds (1 gallons of milk) for 72 hours (3 days) after your procedure. You may walk as much as you can tolerate. It's OK to drive after 72 hours.  Bathing/Showering  You may shower the day after your procedure. If you have a bandage, you may remove it at 24- 48 hours. Clean your incision site with mild soap and water. Pat the area dry with a clean towel.  Diet  Resume your pre-procedure diet. There are no special food restrictions following this procedure. All patients with peripheral vascular disease should follow a low fat/low cholesterol diet. In order to heal from your surgery, it is CRITICAL to get adequate nutrition. Your body requires vitamins, minerals, and protein. Vegetables are the best source of vitamins and minerals. Vegetables also provide the perfect balance of protein. Processed food has little nutritional value, so try to avoid this.  Medications  Resume taking all of your medications unless your doctor tells you not to. If your incision is causing pain, you may take over-the-counter pain relievers such as acetaminophen (Tylenol)  **Hold antihypertensives until blood pressure will tolerate (Metoprolol and Hydralazine).**  Follow Up  Follow up will be arranged at the time of your procedure. You may have an office visit scheduled or may be scheduled for surgery. Ask your surgeon if you have any questions.  Please call us immediately for any of the following conditions: Severe or worsening pain your legs or feet at rest or with walking. Increased pain, redness, drainage at your groin puncture site. Fever of 101 degrees or higher. If you have any mild or slow bleeding  from your puncture site: lie down, apply firm constant pressure over the area with a piece of gauze or a clean wash cloth for 30 minutes- no peeking!, call 911 right away if you are still bleeding after 30 minutes, or if the bleeding is heavy and unmanageable.  Reduce your risk factors of vascular disease:  Stop smoking. If you would like help call [facility] at 1-800-QUIT-NOW ((520)026-0265) or [place] at [phone]. Manage your cholesterol Maintain a desired weight Control your diabetes Keep your blood pressure down  If you have any questions, please call the office at [phone]

## 2016-10-31 NOTE — Clinical Social Work Placement (Signed)
   CLINICAL SOCIAL WORK PLACEMENT  NOTE  Date:  10/31/2016  Patient Details  Name: Sylvia Medina MRN: [id number] Date of Birth: 12-Dec-1935  Clinical Social Work is seeking post-discharge placement for this patient at the Skilled  Nursing Facility level of care (*CSW will initial, date and re-position this form in  chart as items are completed):  Yes   Patient/family provided with [place] Clinical Social Work Department's list of facilities offering this level of care within the geographic area requested by the patient (or if unable, by the patient's family).  Yes   Patient/family informed of their freedom to choose among providers that offer the needed level of care, that participate in Medicare, Medicaid or managed care program needed by the patient, have an available bed and are willing to accept the patient.  Yes   Patient/family informed of [facility]'s ownership interest in [facility] and [facility], as well as of the fact that they are under no obligation to receive care at these facilities.  PASRR submitted to EDS on       PASRR number received on       Existing PASRR number confirmed on 10/31/16     FL2 transmitted to all facilities in geographic area requested by pt/family on 10/31/16     FL2 transmitted to all facilities within larger geographic area on       Patient informed that his/her managed care company has contracts with or will negotiate with certain facilities, including the following:  [facility]     Yes   Patient/family informed of bed offers received.  Patient chooses bed at [facility]     Physician recommends and patient chooses bed at      Patient to be transferred to [facility] on 10/31/16.  Patient to be transferred to facility by [facility]     Patient family notified on 10/31/16 of transfer.  Name of family member notified:  Karizma Cheek     PHYSICIAN Please sign FL2     Additional Comment:     _______________________________________________ Abigail Butts, LCSW 10/31/2016, 12:00 PM

## 2016-10-31 NOTE — Progress Notes (Signed)
Patient will discharge back to [facility]. Anticipated discharge date: 10/31/16 Family notified: Adron Bene Transportation by: Sharin Mons  Nurse to call report to [phone].   CSW signing off.  Abigail Butts, LCSWA  Clinical Social Worker

## 2016-10-31 NOTE — [place] FL2 (Signed)
[place] MEDICAID FL2 LEVEL OF CARE SCREENING TOOL     IDENTIFICATION  Patient Name: Sylvia Medina Birthdate: [birth year] Sex: female Admission Date (Current Location): 10/27/2016  [facility] and [place] Number:  Producer, television/film/video and Address:  [facility] [place], [address] [place], [place] [address]      Provider Number: [id number]  Attending Physician Name and Address:  Fransisco Hertz, MD  Relative Name and Phone Number:  Rafia, [place] [phone]    Current Level of Care: Hospital Recommended Level of Care: Skilled Nursing Facility Prior Approval Number:    Date Approved/Denied:   PASRR Number: [id number]  Discharge Plan: SNF    Current Diagnoses: Patient Active Problem List   Diagnosis Date Noted  . Atherosclerosis of native arteries of the extremities with ulceration (HCC) 10/27/2016  . Atherosclerosis of artery of extremity with ulceration (HCC) 10/27/2016  . CKD (chronic kidney disease) stage 2, GFR 60-89 ml/min 06/15/2016  . AKI (acute kidney injury) (HCC) 06/06/2016  . Acute lower UTI 06/06/2016  . Acute cystitis with hematuria   . Hypernatremia 08/31/2015  . Chronic constipation 07/27/2015  . Protein-calorie malnutrition (HCC) 07/27/2015  . Alzheimer's disease 09/18/2014  . Hyperlipidemia 09/18/2014  . Anemia of chronic disease 09/18/2014  . Constipation 11/17/2013  . Essential hypertension, benign 10/07/2013  . Thyroiditis, subacute 04/14/2013  . Dementia without behavioral disturbance 02/10/2013  . Vitamin D deficiency 07/04/2012  . Depression 07/04/2012  . Diabetes mellitus type 2, diet-controlled (HCC) 07/04/2012  . CVA (cerebral vascular accident) (HCC) 07/04/2012  . Cardiac arrhythmia 07/04/2012    Orientation RESPIRATION BLADDER Height & Weight     Self, Time, Situation, Place  Normal Incontinent, External catheter Weight: 253 lb (114.8 kg) Height:   (165.1 cm)  BEHAVIORAL SYMPTOMS/MOOD NEUROLOGICAL BOWEL  NUTRITION STATUS        Diet (please see DC summary)  AMBULATORY STATUS COMMUNICATION OF NEEDS Skin    (baseline) Verbally PU Stage and Appropriate Care (PU L ankle, silicone; PU L lower leg, silicone; PU R toe; PU stage I anterior knee; PU stage II abdomen L lateral; wound L thigh posterior, silicone)                       Personal Care Assistance Level of Assistance   (baseline)           Functional Limitations Info  Sight, Hearing, Speech Sight Info: Adequate Hearing Info: Adequate Speech Info: Adequate    SPECIAL CARE FACTORS FREQUENCY                       Contractures Contractures Info: Not present    Additional Factors Info  Code Status, Allergies, Isolation Precautions Code Status Info: Full Allergies Info: No Known Allergies     Isolation Precautions Info: Contact Pre     Current Medications (10/31/2016):  This is the current hospital active medication list Current Facility-Administered Medications  Medication Dose Route Frequency Provider Last Rate Last Dose  . 0.9 %  sodium chloride infusion   Intravenous Continuous Fransisco Hertz, MD 75 mL/hr at 10/30/16 2200    . acetaminophen (TYLENOL) tablet 325-650 mg  325-650 mg Oral Q4H PRN Fransisco Hertz, MD       Or  . acetaminophen (TYLENOL) suppository 325-650 mg  325-650 mg Rectal Q4H PRN Fransisco Hertz, MD      . alum & mag hydroxide-simeth (MAALOX/MYLANTA) 200-200-20 MG/5ML suspension 15-30 mL  15-30 mL Oral Q2H PRN Chen, Brian L, MD      . aspiFransisco Hertzlet 325 mg  325 mg Oral Daily Sherren Kerns, MD   325 mg at 10/31/16 1000  . bisacodyl (DULCOLAX) suppository 10 mg  10 mg Rectal Daily PRN Fransisco Hertz, MD      . cholecalciferol (VITAMIN D) tablet 1,000 Units  1,000 Units Oral Daily Fransisco Hertz, MD   1,000 Units at 10/31/16 1000  . clopidogrel (PLAVIX) tablet 75 mg  75 mg Oral Q breakfast Sherren Kerns, MD   75 mg at 10/31/16 0800  . collagenase (SANTYL) ointment   Topical Daily Fransisco Hertz, MD      . docusate sodium (COLACE) capsule 100 mg  100 mg Oral BID Fransisco Hertz, MD   100 mg at 10/31/16 1000  . ferrous sulfate tablet 325 mg  325 mg Oral Q breakfast Fransisco Hertz, MD   325 mg at 10/31/16 0800  . heparin injection 5,000 Units  5,000 Units Subcutaneous Q8H Fransisco Hertz, MD   5,000 Units at 10/30/16 2109  . hydrALAZINE (APRESOLINE) injection 5 mg  5 mg Intravenous Q20 Min PRN Fransisco Hertz, MD      . hydrochlorothiazide (HYDRODIURIL) tablet 12.5 mg  12.5 mg Oral Daily Fransisco Hertz, MD   12.5 mg at 10/31/16 1000  . labetalol (NORMODYNE,TRANDATE) injection 10 mg  10 mg Intravenous Q10 min PRN Fransisco Hertz, MD      . metoprolol tartrate (LOPRESSOR) injection 2-5 mg  2-5 mg Intravenous Q2H PRN Fransisco Hertz, MD      . multivitamin with minerals tablet 1 tablet  1 tablet Oral Daily Fransisco Hertz, MD   1 tablet at 10/31/16 1000  . ondansetron (ZOFRAN) injection 4 mg  4 mg Intravenous Q6H PRN Fransisco Hertz, MD      . oxyCODONE (Oxy IR/ROXICODONE) immediate release tablet 5-10 mg  5-10 mg Oral Q6H PRN Fransisco Hertz, MD   5 mg at 10/29/16 0307  . pantoprazole (PROTONIX) EC tablet 40 mg  40 mg Oral Daily Fransisco Hertz, MD   40 mg at 10/31/16 1000  . polyethylene glycol (MIRALAX / GLYCOLAX) packet 17 g  17 g Oral Daily PRN Fransisco Hertz, MD      . polyethylene glycol (MIRALAX / GLYCOLAX) packet 17 g  17 g Oral Daily Fransisco Hertz, MD   17 g at 10/31/16 1000  . rivastigmine (EXELON) 13.3 MG/24HR 13.3 mg  13.3 mg Transdermal Daily Fransisco Hertz, MD   13.3 mg at 10/30/16 2110  . sodium phosphate (FLEET) 7-19 GM/118ML enema 1 enema  1 enema Rectal Once PRN Fransisco Hertz, MD         Discharge Medications: Please see discharge summary for a list of discharge medications.  Relevant Imaging Results:  Relevant Lab Results:   Additional Information SSN: [id number]  Abigail Butts, LCSW

## 2016-10-31 NOTE — Progress Notes (Signed)
Attempted to give report to Software engineer for Sylvia Medina.No unit answer noted,left a message that this RN is attempting to give report.

## 2016-11-07 ENCOUNTER — Telehealth: Payer: Self-pay | Admitting: Vascular Surgery

## 2016-11-07 NOTE — Telephone Encounter (Signed)
Per staff message I scheduled an appt for this patient on 12/16/16 at 2:30pm for the vascular study and to see Dr.Chen at 3:15pm. I left a VM for the scheduling nurse at [facility] [phone]

## 2016-11-07 NOTE — Telephone Encounter (Signed)
-----   Message from Sharee Pimple, RN sent at 11/07/2016  1:45 PM EDT ----- Regarding: RE: Appt Question  This was sent to the adm pool last week.   Procedure: Abdominal aortogram with right lower extremity runoff stent of right popliteal and superficial femoral artery the   Ultrasound left groin left groin puncture   The patient can be scheduled for follow-up with a right lower extremity arterial duplex and see Dr. Imogene Burn in 1 month.    ----- Message ----- From: Shari Prows Sent: 11/07/2016   1:11 PM To: [facility] Clinical Pool Subject: Appt Question                                  Hello ladies! I received a phone call this morning from [facility] regarding scheduling a post op appt for this patient. I don't see a staff message or discharge summary indicating when she needs to come back to see [place]. Please advise. Thanks, Drinda Butts

## 2016-11-11 ENCOUNTER — Ambulatory Visit (INDEPENDENT_AMBULATORY_CARE_PROVIDER_SITE_OTHER): Payer: Medicare Other | Admitting: Endocrinology

## 2016-11-11 ENCOUNTER — Encounter: Payer: Self-pay | Admitting: Endocrinology

## 2016-11-11 DIAGNOSIS — E0590 Thyrotoxicosis, unspecified without thyrotoxic crisis or storm: Secondary | ICD-10-CM

## 2016-11-11 NOTE — Progress Notes (Signed)
Subjective:    Patient ID: Sylvia Medina, female    DOB: 08-18-1935, 81 y.o.   MRN: [id number]  HPI Pt is referred by Dr Glade Lloyd, for hyperthyroidism.  she was dx'ed with hyperthyroidism in 2017.  she has never been on therapy for this.  she has never had XRT to the anterior neck, or thyroid surgery.  she has never had thyroid imaging.  she does not consume kelp or any other non-prescribed thyroid medication.  she has never been on amiodarone.  She has severe fatigue, and assoc decreased appetite.  Hx is from dtr, due to pt's memory loss.   Past Medical History:  Diagnosis Date  . Anemia 07/04/2012  . Cardiac arrhythmia 07/04/2012  . CVA (cerebral vascular accident) (HCC) 07/04/2012  . Dementia 07/04/2012  . Diabetes mellitus type 2, diet-controlled (HCC) 07/04/2012  . Unspecified constipation 07/04/2012  . Unspecified hypothyroidism 07/04/2012  . Unspecified vitamin D deficiency 07/04/2012    Past Surgical History:  Procedure Laterality Date  . ABDOMINAL AORTOGRAM W/LOWER EXTREMITY N/A 10/28/2016   Procedure: ABDOMINAL AORTOGRAM W/LOWER EXTREMITY;  Surgeon: Sherren Kerns, MD;  Location: [facility] INVASIVE CV LAB;  Service: Cardiovascular;  Laterality: N/A;  . PERIPHERAL VASCULAR INTERVENTION Right 10/28/2016   Procedure: PERIPHERAL VASCULAR INTERVENTION;  Surgeon: Sherren Kerns, MD;  Location: [facility] INVASIVE CV LAB;  Service: Cardiovascular;  Laterality: Right;  Right SFA and Popliteal    Social History   Social History  . Marital status: Widowed    Spouse name: N/A  . Number of children: N/A  . Years of education: N/A   Occupational History  . Not on file.   Social History Main Topics  . Smoking status: Never Smoker  . Smokeless tobacco: Never Used  . Alcohol use No  . Drug use: No  . Sexual activity: Not Currently   Other Topics Concern  . Not on file   Social History Narrative  . No narrative on file    Current Outpatient Prescriptions on File Prior to Visit    Medication Sig Dispense Refill  . Amino Acids-Protein Hydrolys (FEEDING SUPPLEMENT, PRO-STAT SUGAR FREE 64,) LIQD Take 30 mLs by mouth 2 (two) times daily.     Marland Kitchen aspirin EC 81 MG tablet Take 1 tablet (81 mg total) by mouth daily.    Marland Kitchen atorvastatin (LIPITOR) 10 MG tablet Take 1 tablet (10 mg total) by mouth daily. 30 tablet 6  . Cholecalciferol 1000 units tablet Take 1 tablet (1,000 Units total) by mouth daily.    . clopidogrel (PLAVIX) 75 MG tablet Take 1 tablet (75 mg total) by mouth daily with breakfast. 30 tablet 6  . ferrous sulfate 325 (65 FE) MG tablet Take 325 mg by mouth daily with breakfast.     . hydrochlorothiazide (HYDRODIURIL) 12.5 MG tablet Take 12.5 mg by mouth daily. Hold for SBP <110    . metoprolol (LOPRESSOR) 50 MG tablet Take 50 mg by mouth 2 (two) times daily. Hold for SBP < 110 and HR < 60    . Multiple Vitamin (MULTIVITAMIN) tablet Take 1 tablet by mouth daily.    . polyethylene glycol powder (GLYCOLAX/MIRALAX) powder Take 17 g by mouth daily. Mix in 4-8 ounces of Orange Juice and take by mouth daily for constipation.    . Rivastigmine (EXELON) 13.3 MG/24HR PT24 Place 1 patch (13.3 mg total) onto the skin daily. 30 patch    No current facility-administered medications on file prior to visit.     No Known  Allergies  Family History  Problem Relation Age of Onset  . Thyroid disease Neg Hx     Pulse 76   SpO2 98%    Review of Systems   denies weight loss, headache, voice change, visual loss, palpitations, sob, diarrhea, excessive diaphoresis, tremor, anxiety, heat intolerance, and rhinorrhea.  She has muscle weakness and easy bruising.    Objective:   Physical Exam VS: see vs page GEN: no distress.  On stretcher HEAD: head: no deformity eyes: slight bilat proptosis.  external nose and ears are normal mouth: no lesion seen NECK: supple, thyroid is not enlarged, but there is a 1-2 cm right thyroid nodule. CHEST WALL: no deformity LUNGS: clear to  auscultation CV: reg rate and rhythm, no murmur ABD: abdomen is soft, nontender.  no hepatosplenomegaly.  not distended.  no hernia MUSCULOSKELETAL: muscle bulk is normal, but strength is grossly decreased from normal.  no obvious joint swelling.   EXTEMITIES: trace bilat leg edema PULSES: no carotid bruit NEURO:  cn 2-12 grossly intact.   readily moves all 4's.  sensation is intact to touch on all 4's. No tremor. SKIN:  Normal texture and temperature.  No rash or suspicious lesion is visible.  Redundant skin is noted.  Not diaphoretic. NODES:  None palpable at the neck.  PSYCH: alert, oriented to self and place only.  Does not appear anxious nor depressed.     Lab Results  Component Value Date   TSH 0.02 (A) 05/25/2016      Assessment & Plan:  Hyperthyroidism, prob due to multinodular goiter. Frail elderly state: she can't be quatantined.  Patient Instructions  Please start methimazole 10 mg po daily Please do TSH and free T4 in 1 month, and send results here Please come back for a follow-up appointment in 4 months. If resident has fever while taking methimazole, d/c it, do stat CBC, and call us with result, even if the reason for the fever is obvious, because of the risk of a rare side-effect.

## 2016-11-11 NOTE — Patient Instructions (Signed)
Please start methimazole 10 mg po daily Please do TSH and free T4 in 1 month, and send results here Please come back for a follow-up appointment in 4 months. If resident has fever while taking methimazole, d/c it, do stat CBC, and call us with result, even if the reason for the fever is obvious, because of the risk of a rare side-effect.

## 2016-11-13 DIAGNOSIS — E0590 Thyrotoxicosis, unspecified without thyrotoxic crisis or storm: Secondary | ICD-10-CM | POA: Insufficient documentation

## 2016-11-16 ENCOUNTER — Other Ambulatory Visit: Payer: Self-pay

## 2016-11-16 ENCOUNTER — Encounter: Payer: Self-pay | Admitting: Family

## 2016-11-16 DIAGNOSIS — I70229 Atherosclerosis of native arteries of extremities with rest pain, unspecified extremity: Secondary | ICD-10-CM

## 2016-11-16 DIAGNOSIS — I7025 Atherosclerosis of native arteries of other extremities with ulceration: Secondary | ICD-10-CM

## 2016-11-16 DIAGNOSIS — Z48812 Encounter for surgical aftercare following surgery on the circulatory system: Secondary | ICD-10-CM

## 2016-11-17 ENCOUNTER — Encounter (HOSPITAL_COMMUNITY): Payer: Self-pay | Admitting: Family Medicine

## 2016-11-17 ENCOUNTER — Inpatient Hospital Stay (HOSPITAL_COMMUNITY)
Admission: EM | Admit: 2016-11-17 | Discharge: 2016-12-05 | DRG: 638 | Disposition: A | Discharge status: Skilled Nursing Facility | Payer: Medicare Other | Attending: Family Medicine | Admitting: Family Medicine

## 2016-11-17 ENCOUNTER — Emergency Department (HOSPITAL_COMMUNITY): Payer: Medicare Other

## 2016-11-17 DIAGNOSIS — E785 Hyperlipidemia, unspecified: Secondary | ICD-10-CM | POA: Diagnosis present

## 2016-11-17 DIAGNOSIS — M79605 Pain in left leg: Secondary | ICD-10-CM | POA: Diagnosis present

## 2016-11-17 DIAGNOSIS — E0590 Thyrotoxicosis, unspecified without thyrotoxic crisis or storm: Secondary | ICD-10-CM | POA: Diagnosis present

## 2016-11-17 DIAGNOSIS — D638 Anemia in other chronic diseases classified elsewhere: Secondary | ICD-10-CM | POA: Diagnosis not present

## 2016-11-17 DIAGNOSIS — F0390 Unspecified dementia without behavioral disturbance: Secondary | ICD-10-CM | POA: Diagnosis present

## 2016-11-17 DIAGNOSIS — L97329 Non-pressure chronic ulcer of left ankle with unspecified severity: Secondary | ICD-10-CM | POA: Diagnosis present

## 2016-11-17 DIAGNOSIS — I10 Essential (primary) hypertension: Secondary | ICD-10-CM | POA: Diagnosis not present

## 2016-11-17 DIAGNOSIS — I129 Hypertensive chronic kidney disease with stage 1 through stage 4 chronic kidney disease, or unspecified chronic kidney disease: Secondary | ICD-10-CM | POA: Diagnosis present

## 2016-11-17 DIAGNOSIS — E119 Type 2 diabetes mellitus without complications: Secondary | ICD-10-CM | POA: Diagnosis not present

## 2016-11-17 DIAGNOSIS — E1122 Type 2 diabetes mellitus with diabetic chronic kidney disease: Secondary | ICD-10-CM | POA: Diagnosis present

## 2016-11-17 DIAGNOSIS — Z7902 Long term (current) use of antithrombotics/antiplatelets: Secondary | ICD-10-CM | POA: Diagnosis not present

## 2016-11-17 DIAGNOSIS — Z8673 Personal history of transient ischemic attack (TIA), and cerebral infarction without residual deficits: Secondary | ICD-10-CM

## 2016-11-17 DIAGNOSIS — L89610 Pressure ulcer of right heel, unstageable: Secondary | ICD-10-CM | POA: Diagnosis present

## 2016-11-17 DIAGNOSIS — E1151 Type 2 diabetes mellitus with diabetic peripheral angiopathy without gangrene: Secondary | ICD-10-CM | POA: Diagnosis present

## 2016-11-17 DIAGNOSIS — Z87891 Personal history of nicotine dependence: Secondary | ICD-10-CM

## 2016-11-17 DIAGNOSIS — G9340 Encephalopathy, unspecified: Secondary | ICD-10-CM | POA: Insufficient documentation

## 2016-11-17 DIAGNOSIS — Z7189 Other specified counseling: Secondary | ICD-10-CM | POA: Diagnosis not present

## 2016-11-17 DIAGNOSIS — F0280 Dementia in other diseases classified elsewhere without behavioral disturbance: Secondary | ICD-10-CM | POA: Diagnosis present

## 2016-11-17 DIAGNOSIS — E11621 Type 2 diabetes mellitus with foot ulcer: Secondary | ICD-10-CM | POA: Diagnosis present

## 2016-11-17 DIAGNOSIS — E870 Hyperosmolality and hypernatremia: Secondary | ICD-10-CM | POA: Diagnosis present

## 2016-11-17 DIAGNOSIS — N184 Chronic kidney disease, stage 4 (severe): Secondary | ICD-10-CM | POA: Diagnosis present

## 2016-11-17 DIAGNOSIS — Z515 Encounter for palliative care: Secondary | ICD-10-CM | POA: Diagnosis not present

## 2016-11-17 DIAGNOSIS — E11628 Type 2 diabetes mellitus with other skin complications: Secondary | ICD-10-CM | POA: Diagnosis present

## 2016-11-17 DIAGNOSIS — L89159 Pressure ulcer of sacral region, unspecified stage: Secondary | ICD-10-CM | POA: Diagnosis present

## 2016-11-17 DIAGNOSIS — L98419 Non-pressure chronic ulcer of buttock with unspecified severity: Secondary | ICD-10-CM | POA: Diagnosis present

## 2016-11-17 DIAGNOSIS — E039 Hypothyroidism, unspecified: Secondary | ICD-10-CM | POA: Diagnosis present

## 2016-11-17 DIAGNOSIS — L089 Local infection of the skin and subcutaneous tissue, unspecified: Secondary | ICD-10-CM | POA: Diagnosis present

## 2016-11-17 DIAGNOSIS — L97521 Non-pressure chronic ulcer of other part of left foot limited to breakdown of skin: Secondary | ICD-10-CM | POA: Diagnosis present

## 2016-11-17 DIAGNOSIS — L97909 Non-pressure chronic ulcer of unspecified part of unspecified lower leg with unspecified severity: Secondary | ICD-10-CM | POA: Diagnosis present

## 2016-11-17 DIAGNOSIS — D631 Anemia in chronic kidney disease: Secondary | ICD-10-CM | POA: Diagnosis present

## 2016-11-17 DIAGNOSIS — L98499 Non-pressure chronic ulcer of skin of other sites with unspecified severity: Secondary | ICD-10-CM

## 2016-11-17 DIAGNOSIS — Z79899 Other long term (current) drug therapy: Secondary | ICD-10-CM

## 2016-11-17 DIAGNOSIS — R680 Hypothermia, not associated with low environmental temperature: Secondary | ICD-10-CM | POA: Diagnosis not present

## 2016-11-17 DIAGNOSIS — Z7982 Long term (current) use of aspirin: Secondary | ICD-10-CM | POA: Diagnosis not present

## 2016-11-17 DIAGNOSIS — I959 Hypotension, unspecified: Secondary | ICD-10-CM | POA: Diagnosis not present

## 2016-11-17 DIAGNOSIS — E1140 Type 2 diabetes mellitus with diabetic neuropathy, unspecified: Secondary | ICD-10-CM | POA: Diagnosis present

## 2016-11-17 DIAGNOSIS — G309 Alzheimer's disease, unspecified: Secondary | ICD-10-CM | POA: Diagnosis present

## 2016-11-17 DIAGNOSIS — M86462 Chronic osteomyelitis with draining sinus, left tibia and fibula: Secondary | ICD-10-CM

## 2016-11-17 DIAGNOSIS — L97511 Non-pressure chronic ulcer of other part of right foot limited to breakdown of skin: Secondary | ICD-10-CM | POA: Diagnosis present

## 2016-11-17 DIAGNOSIS — M869 Osteomyelitis, unspecified: Secondary | ICD-10-CM | POA: Insufficient documentation

## 2016-11-17 DIAGNOSIS — I70262 Atherosclerosis of native arteries of extremities with gangrene, left leg: Secondary | ICD-10-CM | POA: Diagnosis not present

## 2016-11-17 HISTORY — DX: Chronic kidney disease, stage 4 (severe): N18.4

## 2016-11-17 HISTORY — DX: Thyrotoxicosis, unspecified without thyrotoxic crisis or storm: E05.90

## 2016-11-17 HISTORY — DX: Encephalopathy, unspecified: G93.40

## 2016-11-17 LAB — URINALYSIS, ROUTINE W REFLEX MICROSCOPIC
Bilirubin Urine: NEGATIVE
GLUCOSE, UA: NEGATIVE mg/dL
Ketones, ur: NEGATIVE mg/dL
NITRITE: POSITIVE — AB
PH: 6 (ref 5.0–8.0)
Protein, ur: 30 mg/dL — AB
SPECIFIC GRAVITY, URINE: 1.012 (ref 1.005–1.030)

## 2016-11-17 LAB — BASIC METABOLIC PANEL
ANION GAP: 9 (ref 5–15)
BUN: 50 mg/dL — ABNORMAL HIGH (ref 6–20)
CALCIUM: 9 mg/dL (ref 8.9–10.3)
CO2: 23 mmol/L (ref 22–32)
CREATININE: 2.28 mg/dL — AB (ref 0.44–1.00)
Chloride: 119 mmol/L — ABNORMAL HIGH (ref 101–111)
GFR, EST AFRICAN AMERICAN: 22 mL/min — AB (ref >60)
GFR, EST NON AFRICAN AMERICAN: 19 mL/min — AB (ref >60)
GLUCOSE: 101 mg/dL — AB (ref 65–99)
Potassium: 4.2 mmol/L (ref 3.5–5.1)
SODIUM: 151 mmol/L — AB (ref 135–145)

## 2016-11-17 LAB — CBC WITH DIFFERENTIAL/PLATELET
BASOS PCT: 0 %
Basophils Absolute: 0 10*3/uL (ref 0.0–0.1)
EOS ABS: 0.1 10*3/uL (ref 0.0–0.7)
EOS PCT: 1 %
HCT: 30.7 % — ABNORMAL LOW (ref 36.0–46.0)
Hemoglobin: 9.5 g/dL — ABNORMAL LOW (ref 12.0–15.0)
LYMPHS ABS: 2.6 10*3/uL (ref 0.7–4.0)
Lymphocytes Relative: 22 %
MCH: 25.9 pg — AB (ref 26.0–34.0)
MCHC: 30.9 g/dL (ref 30.0–36.0)
MCV: 83.7 fL (ref 78.0–100.0)
MONOS PCT: 8 %
Monocytes Absolute: 0.9 10*3/uL (ref 0.1–1.0)
Neutro Abs: 8.3 10*3/uL — ABNORMAL HIGH (ref 1.7–7.7)
Neutrophils Relative %: 69 %
PLATELETS: 215 10*3/uL (ref 150–400)
RBC: 3.67 MIL/uL — AB (ref 3.87–5.11)
RDW: 19.7 % — ABNORMAL HIGH (ref 11.5–15.5)
WBC: 12 10*3/uL — AB (ref 4.0–10.5)

## 2016-11-17 LAB — SEDIMENTATION RATE: Sed Rate: 119 mm/hr — ABNORMAL HIGH (ref 0–22)

## 2016-11-17 LAB — I-STAT CG4 LACTIC ACID, ED: Lactic Acid, Venous: 1.13 mmol/L (ref 0.5–1.9)

## 2016-11-17 MED ORDER — VANCOMYCIN HCL 10 G IV SOLR: 2000.0000 mg | Freq: Once | INTRAVENOUS | Status: DC

## 2016-11-17 MED ORDER — ATORVASTATIN CALCIUM 10 MG PO TABS
10.0000 mg | ORAL_TABLET | Freq: Every day | ORAL | Status: DC
  Administered 2016-11-18 – 2016-12-05 (×18): 10 mg via ORAL
  Filled 2016-11-17 (×19): qty 1

## 2016-11-17 MED ORDER — ACETAMINOPHEN 325 MG PO TABS
650.0000 mg | ORAL_TABLET | Freq: Four times a day (QID) | ORAL | Status: DC | PRN
  Administered 2016-11-18 – 2016-12-04 (×6): 650 mg via ORAL
  Filled 2016-11-17 (×5): qty 2

## 2016-11-17 MED ORDER — VANCOMYCIN HCL IN DEXTROSE 1-5 GM/200ML-% IV SOLN: 1000.0000 mg | Freq: Once | INTRAVENOUS | Status: DC

## 2016-11-17 MED ORDER — RIVASTIGMINE 13.3 MG/24HR TD PT24
13.3000 mg | MEDICATED_PATCH | Freq: Every day | TRANSDERMAL | Status: DC
  Administered 2016-11-18 – 2016-12-05 (×18): 13.3 mg via TRANSDERMAL
  Filled 2016-11-17 (×19): qty 1

## 2016-11-17 MED ORDER — CLOPIDOGREL BISULFATE 75 MG PO TABS
75.0000 mg | ORAL_TABLET | Freq: Every day | ORAL | Status: DC
  Administered 2016-11-18 – 2016-12-05 (×18): 75 mg via ORAL
  Filled 2016-11-17 (×18): qty 1

## 2016-11-17 MED ORDER — POLYETHYLENE GLYCOL 3350 17 GM/SCOOP PO POWD
17.0000 g | Freq: Every day | ORAL | Status: DC
  Filled 2016-11-17: qty 255

## 2016-11-17 MED ORDER — METRONIDAZOLE 500 MG PO TABS
500.0000 mg | ORAL_TABLET | Freq: Three times a day (TID) | ORAL | Status: DC
  Administered 2016-11-17 – 2016-12-05 (×54): 500 mg via ORAL
  Filled 2016-11-17 (×54): qty 1

## 2016-11-17 MED ORDER — PIPERACILLIN-TAZOBACTAM 3.375 G IVPB 30 MIN
3.3750 g | Freq: Once | INTRAVENOUS | Status: AC
  Administered 2016-11-17: 3.375 g via INTRAVENOUS
  Filled 2016-11-17: qty 50

## 2016-11-17 MED ORDER — METHIMAZOLE 10 MG PO TABS
10.0000 mg | ORAL_TABLET | Freq: Every day | ORAL | Status: DC
  Administered 2016-11-18: 10 mg via ORAL
  Filled 2016-11-17: qty 1

## 2016-11-17 MED ORDER — ASPIRIN EC 81 MG PO TBEC
81.0000 mg | DELAYED_RELEASE_TABLET | Freq: Every day | ORAL | Status: DC
  Administered 2016-11-18 – 2016-12-05 (×18): 81 mg via ORAL
  Filled 2016-11-17 (×18): qty 1

## 2016-11-17 MED ORDER — ACETAMINOPHEN 650 MG RE SUPP: 650.0000 mg | Freq: Four times a day (QID) | RECTAL | Status: DC | PRN

## 2016-11-17 MED ORDER — FERROUS SULFATE 325 (65 FE) MG PO TABS
325.0000 mg | ORAL_TABLET | Freq: Every day | ORAL | Status: DC
  Administered 2016-11-18 – 2016-12-05 (×18): 325 mg via ORAL
  Filled 2016-11-17 (×18): qty 1

## 2016-11-17 MED ORDER — METOPROLOL TARTRATE 50 MG PO TABS
50.0000 mg | ORAL_TABLET | Freq: Two times a day (BID) | ORAL | Status: DC
  Administered 2016-11-17 – 2016-11-22 (×6): 50 mg via ORAL
  Filled 2016-11-17 (×2): qty 1
  Filled 2016-11-17: qty 2
  Filled 2016-11-17 (×6): qty 1

## 2016-11-17 MED ORDER — ONDANSETRON HCL 4 MG PO TABS: 4.0000 mg | ORAL_TABLET | Freq: Four times a day (QID) | ORAL | Status: DC | PRN

## 2016-11-17 MED ORDER — DOCUSATE SODIUM 100 MG PO CAPS
100.0000 mg | ORAL_CAPSULE | Freq: Two times a day (BID) | ORAL | Status: DC
  Administered 2016-11-17 – 2016-12-05 (×26): 100 mg via ORAL
  Filled 2016-11-17 (×29): qty 1

## 2016-11-17 MED ORDER — ONDANSETRON HCL 4 MG/2ML IJ SOLN: 4.0000 mg | Freq: Four times a day (QID) | INTRAMUSCULAR | Status: DC | PRN

## 2016-11-17 MED ORDER — VANCOMYCIN HCL 10 G IV SOLR
2000.0000 mg | Freq: Once | INTRAVENOUS | Status: AC
  Administered 2016-11-18: 2000 mg via INTRAVENOUS
  Filled 2016-11-17: qty 2000

## 2016-11-17 MED ORDER — DEXTROSE-NACL 5-0.45 % IV SOLN
INTRAVENOUS | Status: DC
  Administered 2016-11-17: 19:00:00 via INTRAVENOUS

## 2016-11-17 MED ORDER — DEXTROSE 5 % IV SOLN
2.0000 g | INTRAVENOUS | Status: DC
  Administered 2016-11-17 – 2016-12-04 (×18): 2 g via INTRAVENOUS
  Filled 2016-11-17 (×19): qty 2

## 2016-11-17 MED ORDER — DEXTROSE 5 % IV SOLN
INTRAVENOUS | Status: DC
  Administered 2016-11-17 – 2016-11-23 (×10): via INTRAVENOUS

## 2016-11-17 MED ORDER — ENOXAPARIN SODIUM 30 MG/0.3ML ~~LOC~~ SOLN
30.0000 mg | SUBCUTANEOUS | Status: DC
  Administered 2016-11-18 – 2016-12-02 (×15): 30 mg via SUBCUTANEOUS
  Filled 2016-11-17 (×15): qty 0.3

## 2016-11-17 MED ORDER — SODIUM CHLORIDE 0.9 % IV BOLUS (SEPSIS)
1000.0000 mL | Freq: Once | INTRAVENOUS | Status: AC
  Administered 2016-11-17: 1000 mL via INTRAVENOUS

## 2016-11-17 NOTE — Progress Notes (Addendum)
   Daily Progress Note   S/p PTA+S R SFA for CTO, chronic R foo wound   Nothing more to add from a Vascular Surgery, I.e. Revascularization, viewpoint as reportedly intact blood flow with dopplerable DP bilaterally  Would get Dr Lajoyce Corners or Podiatry to evaluate the patient's chronic wounds, reportedly present >6 months  Would also get BLE ABI to document improvement in flow  To facilitate this evaluation, transfer to [facility]   Leonides Sake, MD, FACS [facility] Office: [phone] Pager: [phone]  11/17/2016, 8:06 PM

## 2016-11-17 NOTE — ED Notes (Signed)
Bed: WA21 Expected date:  Expected time:  Means of arrival:  Comments: EMS- hyponatremia

## 2016-11-17 NOTE — Progress Notes (Signed)
Pharmacy Antibiotic Note  Sylvia Medina is a 81 y.o. female admitted on 11/17/2016 with suspected diabetic foot infection, with high risk of MRSA.  Pharmacy has been consulted for vancomycin dosing.  Plan: Vancomycin 2 Gm x1 then 1500 mg IV q48h VT=15-20 mg/L d/t concern for osteomyelitis Rocephin 2 Gm IV q24h (MD) Flagyl 500 mg po q8h (MD) F/u scr/cultures/levels     Temp (24hrs), Avg:98.4 F (36.9 C), Min:98.1 F (36.7 C), Max:98.6 F (37 C)   Recent Labs Lab 11/17/16 1850 11/17/16 1851  WBC 12.0*  --   CREATININE 2.28*  --   LATICACIDVEN  --  1.13    CrCl cannot be calculated (Unknown ideal weight.).    No Known Allergies  Antimicrobials this admission: 10/4 zosyn >> x1 ED 10/4 vancomycin >> 10/5 rocephin >>  10/5 flagyl >>  Dose adjustments this admission:   Microbiology results:  BCx:   UCx:    Sputum:    MRSA PCR:   Thank you for allowing pharmacy to be a part of this patient's care.  Lorenza Evangelist 11/17/2016 10:56 PM

## 2016-11-17 NOTE — ED Triage Notes (Signed)
Patient is from [facility] and transported via [facility]. Patient had BMP performed today and the facility provider sent her to ED for NA 153 and left wound infection. Per EMS, patient is to her baseline, alert, oriented x 3 with disoriented to time. Patient has had 5 consecutive high sodium results. Denies any symptoms or pain.

## 2016-11-17 NOTE — ED Provider Notes (Signed)
[facility]-EMERGENCY DEPT Provider Note   CSN: [id number] Arrival date & time: 11/17/16  1750     History   Chief Complaint Chief Complaint  Patient presents with  . Abnormal Lab    HPI Sylvia Medina is a 81 y.o. female.  81 yo F with a chief complaint of hypernatremia and chronic leg wounds. The patient is unsure why she is here. Has a history of a stroke that makes her bedbound. Old records reviewed patient had a recent vascular stent placed in her right superficial femoral artery. This is due to chronic ulcers to her right foot. I'm unsure if these had temporarily improved but appear to be worsening per the notes from her nursing home. Level V caveat dementia.   The history is provided by the patient.  Abnormal Lab  Illness  This is a new problem. The current episode started more than 1 week ago. The problem occurs constantly. The problem has been gradually worsening. Pertinent negatives include no chest pain, no headaches and no shortness of breath. Nothing aggravates the symptoms. Nothing relieves the symptoms. She has tried nothing for the symptoms. The treatment provided no relief.    Past Medical History:  Diagnosis Date  . Anemia 07/04/2012  . Cardiac arrhythmia 07/04/2012  . CKD (chronic kidney disease) stage 4, GFR 15-29 ml/min (HCC)   . CVA (cerebral vascular accident) (HCC) 07/04/2012  . Dementia 07/04/2012  . Diabetes mellitus type 2, diet-controlled (HCC) 07/04/2012  . Hyperthyroidism   . Unspecified constipation 07/04/2012  . Unspecified vitamin D deficiency 07/04/2012    Patient Active Problem List   Diagnosis Date Noted  . Osteomyelitis (HCC) 11/17/2016  . Lower extremity ulceration (HCC) 11/17/2016  . Hyperthyroidism 11/13/2016  . Atherosclerosis of native arteries of the extremities with ulceration (HCC) 10/27/2016  . Atherosclerosis of artery of extremity with ulceration (HCC) 10/27/2016  . CKD (chronic kidney disease) stage 4, GFR 15-29 ml/min (HCC)  06/15/2016  . AKI (acute kidney injury) (HCC) 06/06/2016  . Acute lower UTI 06/06/2016  . Acute cystitis with hematuria   . Hypernatremia 08/31/2015  . Chronic constipation 07/27/2015  . Protein-calorie malnutrition (HCC) 07/27/2015  . Alzheimer's disease 09/18/2014  . Hyperlipidemia 09/18/2014  . Anemia of chronic disease 09/18/2014  . Essential hypertension, benign 10/07/2013  . Thyroiditis, subacute 04/14/2013  . Dementia without behavioral disturbance 02/10/2013  . Vitamin D deficiency 07/04/2012  . Depression 07/04/2012  . Diabetes mellitus type 2, diet-controlled (HCC) 07/04/2012  . CVA (cerebral vascular accident) (HCC) 07/04/2012  . Cardiac arrhythmia 07/04/2012    Past Surgical History:  Procedure Laterality Date  . ABDOMINAL AORTOGRAM W/LOWER EXTREMITY N/A 10/28/2016   Procedure: ABDOMINAL AORTOGRAM W/LOWER EXTREMITY;  Surgeon: Sherren Kerns, MD;  Location: [facility];  Service: Cardiovascular;  Laterality: N/A;  . PERIPHERAL VASCULAR INTERVENTION Right 10/28/2016   Procedure: PERIPHERAL VASCULAR INTERVENTION;  Surgeon: Sherren Kerns, MD;  Location: [facility];  Service: Cardiovascular;  Laterality: Right;  Right SFA and Popliteal    OB History    No data available       Home Medications    Prior to Admission medications   Medication Sig Start Date End Date Taking? Authorizing Provider  Amino Acids-Protein Hydrolys (FEEDING SUPPLEMENT, PRO-STAT SUGAR FREE 64,) LIQD Take 30 mLs by mouth 2 (two) times daily as needed.    Yes [provider]  aspirin EC 81 MG tablet Take 1 tablet (81 mg total) by mouth daily. 10/28/16  Yes Doreatha Massed  J, PA-C  atorvastatin (LIPITOR) 10 MG tablet Take 1 tablet (10 mg total) by mouth daily. 10/28/16  Yes Rhyne, Ames Coupe, PA-C  Cholecalciferol 1000 units tablet Take 1 tablet (1,000 Units total) by mouth daily. 08/31/15  Yes Ngetich, Dinah C, NP  clopidogrel (PLAVIX) 75 MG tablet Take 1 tablet (75 mg  total) by mouth daily with breakfast. 10/29/16  Yes Rhyne, Samantha J, PA-C  collagenase (SANTYL) ointment Apply 1 application topically daily.   Yes [provider]  diclofenac sodium (VOLTAREN) 1 % GEL Apply 2 g topically 2 (two) times daily as needed (joint pain).    Yes [provider]  ferrous sulfate 325 (65 FE) MG tablet Take 325 mg by mouth daily with breakfast.    Yes [provider]  hydrochlorothiazide (HYDRODIURIL) 12.5 MG tablet Take 12.5 mg by mouth daily. Hold for SBP <110   Yes [provider]  methimazole (TAPAZOLE) 10 MG tablet Take 10 mg by mouth daily.   Yes [provider]  metoprolol (LOPRESSOR) 50 MG tablet Take 50 mg by mouth 2 (two) times daily. Hold for SBP < 110 and HR < 60   Yes [provider]  Multiple Vitamin (MULTIVITAMIN) tablet Take 1 tablet by mouth daily.   Yes [provider]  polyethylene glycol powder (GLYCOLAX/MIRALAX) powder Take 17 g by mouth daily. Mix in 4-8 ounces of Orange Juice and take by mouth daily for constipation. 07/04/12  Yes Lucrezia Starch, NP  Rivastigmine (EXELON) 13.3 MG/24HR PT24 Place 1 patch (13.3 mg total) onto the skin daily. 07/04/12  Yes Lucrezia Starch, NP    Family History Family History  Problem Relation Age of Onset  . Thyroid disease Neg Hx     Social History Social History  Substance Use Topics  . Smoking status: Former Smoker    Packs/day: 0.50    Years: 40.00  . Smokeless tobacco: Never Used  . Alcohol use No     Allergies   Patient has no known allergies.   Review of Systems Review of Systems  Constitutional: Negative for chills and fever.  HENT: Negative for congestion and rhinorrhea.   Eyes: Negative for redness and visual disturbance.  Respiratory: Negative for shortness of breath and wheezing.   Cardiovascular: Negative for chest pain and palpitations.  Gastrointestinal: Negative for nausea and vomiting.  Genitourinary: Negative for dysuria  and urgency.  Musculoskeletal: Negative for arthralgias and myalgias.  Skin: Negative for pallor and wound.  Neurological: Negative for dizziness and headaches.     Physical Exam Updated Vital Signs BP 95/66 (BP Location: Left Arm) Comment (BP Location): Lower  Pulse 69   Temp 98.1 F (36.7 C) (Oral)   Resp 20   SpO2 100%   Physical Exam  Constitutional: She is oriented to person, place, and time. She appears well-developed and well-nourished. No distress.  HENT:  Head: Normocephalic and atraumatic.  Eyes: Pupils are equal, round, and reactive to light. EOM are normal.  Neck: Normal range of motion. Neck supple.  Cardiovascular: Normal rate and regular rhythm.  Exam reveals no gallop and no friction rub.   No murmur heard. Pulmonary/Chest: Effort normal. She has no wheezes. She has no rales.  Abdominal: Soft. She exhibits no distension and no mass. There is no tenderness. There is no guarding.  Musculoskeletal: She exhibits no edema or tenderness.       Legs:      Feet:  She has dopplerable DPs bilaterally. Triphasic on the right and monophasic  on the left  Neurological: She is alert and oriented to person, place, and time.  Skin: Skin is warm and dry. She is not diaphoretic.  Psychiatric: She has a normal mood and affect. Her behavior is normal.  Nursing note and vitals reviewed.    ED Treatments / Results  Labs (all labs ordered are listed, but only abnormal results are displayed) Labs Reviewed  CBC WITH DIFFERENTIAL/PLATELET - Abnormal; Notable for the following:       Result Value   WBC 12.0 (*)    RBC 3.67 (*)    Hemoglobin 9.5 (*)    HCT 30.7 (*)    MCH 25.9 (*)    RDW 19.7 (*)    Neutro Abs 8.3 (*)    All other components within normal limits  BASIC METABOLIC PANEL - Abnormal; Notable for the following:    Sodium 151 (*)    Chloride 119 (*)    Glucose, Bld 101 (*)    BUN 50 (*)    Creatinine, Ser 2.28 (*)    GFR calc non Af Amer 19 (*)    GFR calc Af  Amer 22 (*)    All other components within normal limits  URINALYSIS, ROUTINE W REFLEX MICROSCOPIC - Abnormal; Notable for the following:    APPearance HAZY (*)    Hgb urine dipstick MODERATE (*)    Protein, ur 30 (*)    Nitrite POSITIVE (*)    Leukocytes, UA LARGE (*)    Bacteria, UA MANY (*)    Squamous Epithelial / LPF 0-5 (*)    All other components within normal limits  CULTURE, BLOOD (ROUTINE X 2)  CULTURE, BLOOD (ROUTINE X 2)  SEDIMENTATION RATE  C-REACTIVE PROTEIN  PREALBUMIN  BASIC METABOLIC PANEL  CBC  I-STAT CHEM 8, ED  I-STAT CG4 LACTIC ACID, ED    EKG  EKG Interpretation None       Radiology Dg Tibia/fibula Left  Result Date: 11/17/2016 CLINICAL DATA:  Leg wound, ulcer EXAM: LEFT TIBIA AND FIBULA - 2 VIEW COMPARISON:  None. FINDINGS: No fracture is seen. Large soft tissue ulceration distal lateral aspect of the left lower leg. Diffuse osteopenia. Possible cortical erosion distal anterior aspect of the tibia at the ankle joint. IMPRESSION: 1. Diffuse soft tissue swelling with large soft tissue deformity at the mid to distal lower leg on the lateral side. 2. Possible cortical erosive change at the distal anterior tibia at the ankle joint, cannot exclude small focus of osteomyelitis. Electronically Signed   By: Jasmine Pang M.D.   On: 11/17/2016 19:55   Dg Foot Complete Right  Result Date: 11/17/2016 CLINICAL DATA:  Foot ulcer EXAM: RIGHT FOOT COMPLETE - 3+ VIEW COMPARISON:  None. FINDINGS: The examination is limited by positioning and severe osteopenia. This limits detection for fracture. No gross acute displaced fracture or malalignment is seen. No definitive periostitis or obvious bone destruction. No soft tissue gas IMPRESSION: Marked osteopenia limits the study. No gross acute osseous abnormality Electronically Signed   By: Jasmine Pang M.D.   On: 11/17/2016 19:52    Procedures Procedures (including critical care time)  Medications Ordered in ED Medications   methimazole (TAPAZOLE) tablet 10 mg (not administered)  aspirin EC tablet 81 mg (not administered)  atorvastatin (LIPITOR) tablet 10 mg (not administered)  clopidogrel (PLAVIX) tablet 75 mg (not administered)  metoprolol tartrate (LOPRESSOR) tablet 50 mg (not administered)  ferrous sulfate tablet 325 mg (not administered)  polyethylene glycol powder (GLYCOLAX/MIRALAX) container 17 g (  not administered)  rivastigmine (EXELON) 13.3 MG/24HR 13.3 mg (not administered)  acetaminophen (TYLENOL) tablet 650 mg (not administered)    Or  acetaminophen (TYLENOL) suppository 650 mg (not administered)  docusate sodium (COLACE) capsule 100 mg (not administered)  ondansetron (ZOFRAN) tablet 4 mg (not administered)    Or  ondansetron (ZOFRAN) injection 4 mg (not administered)  cefTRIAXone (ROCEPHIN) 2 g in dextrose 5 % 50 mL IVPB (not administered)    And  metroNIDAZOLE (FLAGYL) tablet 500 mg (not administered)  enoxaparin (LOVENOX) injection 30 mg (not administered)  dextrose 5 % solution (not administered)  sodium chloride 0.9 % bolus 1,000 mL (0 mLs Intravenous Stopped 11/17/16 2048)  piperacillin-tazobactam (ZOSYN) IVPB 3.375 g (0 g Intravenous Stopped 11/17/16 1931)     Initial Impression / Assessment and Plan / ED Course  I have reviewed the triage vital signs and the nursing notes.  Pertinent labs & imaging results that were available during my care of the patient were reviewed by me and considered in my medical decision making (see chart for details).     81 yo F with a chief complaint of chronic leg wounds and hypernatremia. Both of these are ongoing issues but seems to be worsening. Sent from the nursing home for likely admission to the hospital. Both feet are warm I'm unable to palpate pulses on either side. She has large deep ulcerations to her left lateral leg. She has a ulceration to the right heel with some purulent drainage. She has an ulceration to the right second digit that appears  to enter the joint space of the toe. Will start on antibiotics. Recheck labs. Image the areas.  Discussed the case with Dr. Imogene Burn, vascular surgery.  Feels that she is likely not a surgical candidate but should she need a vascular procedure would need to be at [facility].   The patients results and plan were reviewed and discussed.   Any x-rays performed were independently reviewed by myself.   Differential diagnosis were considered with the presenting HPI.  Medications  methimazole (TAPAZOLE) tablet 10 mg (not administered)  aspirin EC tablet 81 mg (not administered)  atorvastatin (LIPITOR) tablet 10 mg (not administered)  clopidogrel (PLAVIX) tablet 75 mg (not administered)  metoprolol tartrate (LOPRESSOR) tablet 50 mg (not administered)  ferrous sulfate tablet 325 mg (not administered)  polyethylene glycol powder (GLYCOLAX/MIRALAX) container 17 g (not administered)  rivastigmine (EXELON) 13.3 MG/24HR 13.3 mg (not administered)  acetaminophen (TYLENOL) tablet 650 mg (not administered)    Or  acetaminophen (TYLENOL) suppository 650 mg (not administered)  docusate sodium (COLACE) capsule 100 mg (not administered)  ondansetron (ZOFRAN) tablet 4 mg (not administered)    Or  ondansetron (ZOFRAN) injection 4 mg (not administered)  cefTRIAXone (ROCEPHIN) 2 g in dextrose 5 % 50 mL IVPB (not administered)    And  metroNIDAZOLE (FLAGYL) tablet 500 mg (not administered)  enoxaparin (LOVENOX) injection 30 mg (not administered)  dextrose 5 % solution (not administered)  sodium chloride 0.9 % bolus 1,000 mL (0 mLs Intravenous Stopped 11/17/16 2048)  piperacillin-tazobactam (ZOSYN) IVPB 3.375 g (0 g Intravenous Stopped 11/17/16 1931)    Vitals:   11/17/16 1759 11/17/16 1801 11/17/16 2050 11/17/16 2204  BP: (!) 112/95  (!) 130/46 95/66  Pulse: 74  73 69  Resp: Temp: 98.6 F (37 C)   98.1 F (36.7 C)  TempSrc: Oral   Oral  SpO2: 100% 96% 99% 100%    Final diagnoses:  Left leg pain  Chronic osteomyelitis of left tibia with draining sinus (HCC)    Admission/ observation were discussed with the admitting physician, patient and/or family and they are comfortable with the plan.    Final Clinical Impressions(s) / ED Diagnoses   Final diagnoses:  Left leg pain  Chronic osteomyelitis of left tibia with draining sinus [place])    New Prescriptions New Prescriptions   No medications on file     Melene Plan, DO 11/17/16 2239

## 2016-11-17 NOTE — H&P (Signed)
History and Physical    Sylvia Medina WUJ:[id number] DOB: 1935/08/30 DOA: 11/17/2016  PCP: Bl[facility] Mane Consultants:  Imogene Burn - vascular; Everardo All - endocrinology Patient coming from: [facility]; NOK: daughter, [phone]  Chief Complaint: LE wounds  HPI: Sylvia Medina is a 81 y.o. female with medical history significant of hypothyroidism but currently being treated forPVD with recent stent pplacement in right superficial femoral artery and right popliteal artery;  hyperthyroidism; DM; dementia; remote CVA; and CKD presenting after being sent from her SNF with elevated sodium and an infected left leg.  She was given a shot today and had a wound culture.  They just noticed the infection today, "they said it wasn't there yesterday."  She has had a number of ulcers - R second toe ulcer since last year, treated off and on; wound on the back of her left leg, uncertain duration; left leg wound, her daughter never noticed it, so maybe present for a couple of weeks.  No fevers.  She has been hoarse at times, had an appointment last week with endocrinology for hyperthyroidism and was supposed to start medication for this.  She had a stent placed in her right leg by Dr. Imogene Burn on September 14.  It seems like she has done well with this, better Doppler pulse on right than leg.   ED Course: Multiple ulcers to B LE, no palpable pulses but they are Dopplerable R > L.  Given Zosyn.  Dr. Imogene Burn consulted - he has nothing further to add at this time but recommends transfer to [facility] for admission with ABIs and orthopedics consult.  Review of Systems: Unable to perform   Ambulatory Status:  Non-ambulatory  Past Medical History:  Diagnosis Date  . Anemia 07/04/2012  . Cardiac arrhythmia 07/04/2012  . CKD stage 2 due to type 2 diabetes mellitus (HCC)   . CVA (cerebral vascular accident) (HCC) 07/04/2012  . Dementia 07/04/2012  . Diabetes mellitus type 2, diet-controlled (HCC) 07/04/2012  . Unspecified  constipation 07/04/2012  . Unspecified hypothyroidism 07/04/2012  . Unspecified vitamin D deficiency 07/04/2012    Past Surgical History:  Procedure Laterality Date  . ABDOMINAL AORTOGRAM W/LOWER EXTREMITY N/A 10/28/2016   Procedure: ABDOMINAL AORTOGRAM W/LOWER EXTREMITY;  Surgeon: Sherren Kerns, MD;  Location: [facility] INVASIVE CV LAB;  Service: Cardiovascular;  Laterality: N/A;  . PERIPHERAL VASCULAR INTERVENTION Right 10/28/2016   Procedure: PERIPHERAL VASCULAR INTERVENTION;  Surgeon: Sherren Kerns, MD;  Location: [facility] INVASIVE CV LAB;  Service: Cardiovascular;  Laterality: Right;  Right SFA and Popliteal    Social History   Social History  . Marital status: Widowed    Spouse name: N/A  . Number of children: N/A  . Years of education: N/A   Occupational History  . retired    Social History Main Topics  . Smoking status: Former Smoker    Packs/day: 0.50    Years: 40.00  . Smokeless tobacco: Never Used  . Alcohol use No  . Drug use: No  . Sexual activity: Not Currently   Other Topics Concern  . Not on file   Social History Narrative  . No narrative on file    No Known Allergies  Family History  Problem Relation Age of Onset  . Thyroid disease Neg Hx     Prior to Admission medications   Medication Sig Start Date End Date Taking? Authorizing Provider  Amino Acids-Protein Hydrolys (FEEDING SUPPLEMENT, PRO-STAT SUGAR FREE 64,) LIQD Take 30 mLs by mouth 2 (two) times daily  as needed.    Yes [provider]  aspirin EC 81 MG tablet Take 1 tablet (81 mg total) by mouth daily. 10/28/16  Yes Rhyne, Samantha J, PA-C  atorvastatin (LIPITOR) 10 MG tablet Take 1 tablet (10 mg total) by mouth daily. 10/28/16  Yes Rhyne, Ames Coupe, PA-C  Cholecalciferol 1000 units tablet Take 1 tablet (1,000 Units total) by mouth daily. 08/31/15  Yes Ngetich, Dinah C, NP  clopidogrel (PLAVIX) 75 MG tablet Take 1 tablet (75 mg total) by mouth daily with breakfast. 10/29/16  Yes Rhyne, Samantha  J, PA-C  collagenase (SANTYL) ointment Apply 1 application topically daily.   Yes [provider]  diclofenac sodium (VOLTAREN) 1 % GEL Apply 2 g topically 2 (two) times daily as needed (joint pain).    Yes [provider]  ferrous sulfate 325 (65 FE) MG tablet Take 325 mg by mouth daily with breakfast.    Yes [provider]  hydrochlorothiazide (HYDRODIURIL) 12.5 MG tablet Take 12.5 mg by mouth daily. Hold for SBP <110   Yes [provider]  methimazole (TAPAZOLE) 10 MG tablet Take 10 mg by mouth daily.   Yes [provider]  metoprolol (LOPRESSOR) 50 MG tablet Take 50 mg by mouth 2 (two) times daily. Hold for SBP < 110 and HR < 60   Yes [provider]  Multiple Vitamin (MULTIVITAMIN) tablet Take 1 tablet by mouth daily.   Yes [provider]  polyethylene glycol powder (GLYCOLAX/MIRALAX) powder Take 17 g by mouth daily. Mix in 4-8 ounces of Orange Juice and take by mouth daily for constipation. 07/04/12  Yes Lucrezia Starch, NP  Rivastigmine (EXELON) 13.3 MG/24HR PT24 Place 1 patch (13.3 mg total) onto the skin daily. 07/04/12  Yes Lucrezia Starch, NP    Physical Exam: Vitals:   11/17/16 1759 11/17/16 1801 11/17/16 2050 11/17/16 2204  BP: (!) 112/95  (!) 130/46 95/66  Pulse: 74  73 69  Resp: Temp: 98.6 F (37 C)   98.1 F (36.7 C)  TempSrc: Oral   Oral  SpO2: 100% 96% 99% 100%     General:  Appears calm and comfortable and is NAD.  She was asleep with the sheet over her head for the majority of the evaluation Eyes:  PERRL, EOMI, normal lids, iris ENT:  grossly normal hearing, lips & tongue, mmm; edentulous Neck:  no LAD, masses or thyromegaly Cardiovascular:  RRR, no m/r/g. No LE edema.  Respiratory:   CTA bilaterally with no wheezes/rales/rhonchi.  Normal respiratory effort. Abdomen:  soft, NT, ND, NABS Skin: Multiple ulcerations including: anterior right 2nd toe - chronic, somewhat shallow without surrounding  erythema; R medial heel with purulent, foul-smelling draining and surrounding erythema; and 2 ulcers on the left lateral lower leg at the ankle and mid-calf with extensive ulceration, purulent drainage Musculoskeletal: extremely obese LE with minimal muscle tone Psychiatric:  grossly normal mood and affect, speech fluent and appropriate Neurologic:  CN 2-12 grossly intact    Radiological Exams on Admission: Dg Tibia/fibula Left  Result Date: 11/17/2016 CLINICAL DATA:  Leg wound, ulcer EXAM: LEFT TIBIA AND FIBULA - 2 VIEW COMPARISON:  None. FINDINGS: No fracture is seen. Large soft tissue ulceration distal lateral aspect of the left lower leg. Diffuse osteopenia. Possible cortical erosion distal anterior aspect of the tibia at the ankle joint. IMPRESSION: 1. Diffuse soft tissue swelling with large soft tissue deformity at the mid to distal lower leg on the lateral side. 2.  Possible cortical erosive change at the distal anterior tibia at the ankle joint, cannot exclude small focus of osteomyelitis. Electronically Signed   By: Jasmine Pang M.D.   On: 11/17/2016 19:55   Dg Foot Complete Right  Result Date: 11/17/2016 CLINICAL DATA:  Foot ulcer EXAM: RIGHT FOOT COMPLETE - 3+ VIEW COMPARISON:  None. FINDINGS: The examination is limited by positioning and severe osteopenia. This limits detection for fracture. No gross acute displaced fracture or malalignment is seen. No definitive periostitis or obvious bone destruction. No soft tissue gas IMPRESSION: Marked osteopenia limits the study. No gross acute osseous abnormality Electronically Signed   By: Jasmine Pang M.D.   On: 11/17/2016 19:52    EKG: not done   Labs on Admission: I have personally reviewed the available labs and imaging studies at the time of the admission.  Pertinent labs:   Na++ 151 Glucose 101 BUN 50/Creatinine 2.28/GFR 22 - improved Lactate 1.13 WBC 12.0 Hgb 9.5 - stable  Assessment/Plan Principal Problem:   Lower  extremity ulceration (HCC) Active Problems:   Diabetes mellitus type 2, diet-controlled (HCC)   Dementia without behavioral disturbance   Essential hypertension, benign   Anemia of chronic disease   Hypernatremia   CKD (chronic kidney disease) stage 4, GFR 15-29 ml/min (HCC)   Hyperthyroidism   Lower extremity ulcers, multiple and bilateral -Multiple ulcers, several concerning for a much deeper infection/concerning for osteomyelitis -Will admit to Med Surg at [facility] -Antibiotics with Rocephin, Flagyl, and Vancomycin per lower extremity ulcer order set -Needs consult by orthopedics, likely Dr. Lajoyce Corners, tomorrow -Diabetic foot infection order set utilized, including orders for CM, SW, DM coordinator, wound care, and nutrition consult. -Goal would be for glucose <150 to facilitate wound healing. -Patient should be on bed rest, non-weight bearing.  She is non-ambulatory so this should not be an issue. -Excellent BP control is needed  -Her daughter may be somewhat aggressive in her desire for treatment for the patient and is not eager to pursue amputation; she is actively praying that amputation will not be needed  Hypernatremia -Patient with h/o Alzheimer's and so likely decreased thirst but also decreased ability to request food/drink -This is a chronic intermittent problem for her; Na++ was as high as 157 in 4/18 -Will treat with judicious initial hydration with D5W at 75 cc/hour -Hold nephrotoxic agents as much as possible  CKD -Stage 2 CKD as of 5/18 -For the last month, her creatinine has been consistent in the 2+ range with stage 4 CKD -It is not clear what led to this change, possibly related to stent placement although this appears to have been present prior to stent placement  -Will follow -Avoid nephrotoxic agents when possible  DM -Diet controlled -Will follow with fasting AM labs -It is unlikely that she will need acute or chronic treatment for this issue  HTN -Hold HCTZ  and likely need to discontinue this medication due to renal failure -Continue Lopressor  Anemia -Stable -Will follow  Dementia -Continue Exelon -No reported behavioral disturbance so no plan for a sitter at this time  Hyperthyroidism -Thought to be due to multinodular goiter -Started on methimazole -Plan is for repeat thyroid testing in late October  DVT prophylaxis: Lovenox  Code Status:  Full - confirmed with family Family Communication: Daughter present throughout evaluation Disposition Plan:  Home once clinically improved Consults called: Vascular - telephone only; Orthopedics will need to be called in AM; SW/CM/wound care/nutrition Admission status: Admit - It is my clinical opinion  that admission to INPATIENT is reasonable and necessary because this patient will require at least 2 midnights in the hospital to treat this condition based on the medical complexity of the problems presented.  Given the aforementioned information, the predictability of an adverse outcome is felt to be significant.    Jonah Blue MD [facility]  If note is complete, please contact covering daytime or nighttime physician. [email or web address] Password [email or web address]  11/17/2016, 10:07 PM

## 2016-11-18 ENCOUNTER — Encounter (HOSPITAL_COMMUNITY): Payer: Medicare Other

## 2016-11-18 ENCOUNTER — Inpatient Hospital Stay (HOSPITAL_COMMUNITY): Payer: Medicare Other

## 2016-11-18 ENCOUNTER — Ambulatory Visit: Payer: Medicare Other | Admitting: Family

## 2016-11-18 DIAGNOSIS — I70262 Atherosclerosis of native arteries of extremities with gangrene, left leg: Secondary | ICD-10-CM

## 2016-11-18 DIAGNOSIS — M86462 Chronic osteomyelitis with draining sinus, left tibia and fibula: Secondary | ICD-10-CM

## 2016-11-18 DIAGNOSIS — L97909 Non-pressure chronic ulcer of unspecified part of unspecified lower leg with unspecified severity: Secondary | ICD-10-CM

## 2016-11-18 LAB — COMPREHENSIVE METABOLIC PANEL
ALK PHOS: 163 U/L — AB (ref 38–126)
ALT: 28 U/L (ref 14–54)
ANION GAP: 7 (ref 5–15)
AST: 45 U/L — ABNORMAL HIGH (ref 15–41)
Albumin: 1.8 g/dL — ABNORMAL LOW (ref 3.5–5.0)
BILIRUBIN TOTAL: 0.8 mg/dL (ref 0.3–1.2)
BUN: 41 mg/dL — ABNORMAL HIGH (ref 6–20)
CALCIUM: 8.4 mg/dL — AB (ref 8.9–10.3)
CO2: 18 mmol/L — ABNORMAL LOW (ref 22–32)
Chloride: 118 mmol/L — ABNORMAL HIGH (ref 101–111)
Creatinine, Ser: 2.12 mg/dL — ABNORMAL HIGH (ref 0.44–1.00)
GFR calc Af Amer: 24 mL/min — ABNORMAL LOW (ref >60)
GFR, EST NON AFRICAN AMERICAN: 21 mL/min — AB (ref >60)
Glucose, Bld: 136 mg/dL — ABNORMAL HIGH (ref 65–99)
POTASSIUM: 3.8 mmol/L (ref 3.5–5.1)
Sodium: 143 mmol/L (ref 135–145)
TOTAL PROTEIN: 5.9 g/dL — AB (ref 6.5–8.1)

## 2016-11-18 LAB — CBC
HCT: 27.5 % — ABNORMAL LOW (ref 36.0–46.0)
HEMOGLOBIN: 8.4 g/dL — AB (ref 12.0–15.0)
MCH: 25.5 pg — ABNORMAL LOW (ref 26.0–34.0)
MCHC: 30.5 g/dL (ref 30.0–36.0)
MCV: 83.6 fL (ref 78.0–100.0)
PLATELETS: 188 10*3/uL (ref 150–400)
RBC: 3.29 MIL/uL — AB (ref 3.87–5.11)
RDW: 19.1 % — ABNORMAL HIGH (ref 11.5–15.5)
WBC: 13.5 10*3/uL — ABNORMAL HIGH (ref 4.0–10.5)

## 2016-11-18 LAB — T4, FREE: FREE T4: 0.83 ng/dL (ref 0.61–1.12)

## 2016-11-18 LAB — PREALBUMIN: PREALBUMIN: 9.2 mg/dL — AB (ref 18–38)

## 2016-11-18 LAB — LACTIC ACID, PLASMA: LACTIC ACID, VENOUS: 1.3 mmol/L (ref 0.5–1.9)

## 2016-11-18 LAB — GLUCOSE, CAPILLARY: GLUCOSE-CAPILLARY: 129 mg/dL — AB (ref 65–99)

## 2016-11-18 LAB — C-REACTIVE PROTEIN: CRP: 18.8 mg/dL — AB (ref <1.0)

## 2016-11-18 MED ORDER — PRO-STAT SUGAR FREE PO LIQD
30.0000 mL | Freq: Two times a day (BID) | ORAL | Status: DC
  Administered 2016-11-18 – 2016-12-03 (×25): 30 mL via ORAL
  Filled 2016-11-18 (×32): qty 30

## 2016-11-18 MED ORDER — POLYETHYLENE GLYCOL 3350 17 G PO PACK
17.0000 g | PACK | Freq: Every day | ORAL | Status: DC
  Administered 2016-11-18 – 2016-12-05 (×13): 17 g via ORAL
  Filled 2016-11-18 (×15): qty 1

## 2016-11-18 MED ORDER — VANCOMYCIN HCL 10 G IV SOLR
1500.0000 mg | INTRAVENOUS | Status: DC
  Administered 2016-11-19: 1500 mg via INTRAVENOUS
  Filled 2016-11-18: qty 1500

## 2016-11-18 NOTE — Significant Event (Addendum)
Rapid Response Event Note  Overview:  Called by RN for patient unresponsive to sternal rub Time Called: 1157 Arrival Time: 1203 Event Type: Other (Comment)  Initial Focused Assessment:  Called by RN to evaluate patient for unresponsiveness to sternal rub and increased lethargy.  On my arrival to patients room, student RN's at bedside.  Patient is lethargic, responded to painful stimuli able to tell me her name and in hospital but not able to name hospital.  Skin is cool and dry.  CBG 129.  , Able to move all extremities.  111/55, Temp rectal 94.6, RR 12,    Interventions:  No focal deficits.  She does drift off to sleep, but arouses easily.  No narcotics received recently, did get tapzole which could be causing her lethargy.  Labs have not been drawn this am, so called lab to come and obtain labs Recommend calling MD for update    Plan of Care (if not transferred): Rn To monitor and call if assistance needed Event Summary:    at      at          [facility], Maryagnes Amos

## 2016-11-18 NOTE — Progress Notes (Signed)
Inpatient Diabetes Program Recommendations  AACE/ADA: New Consensus Statement on Inpatient Glycemic Control (2015)  Target Ranges:  Prepandial:   less than 140 mg/dL      Peak postprandial:   less than 180 mg/dL (1-2 hours)      Critically ill patients:  140 - 180 mg/dL    Admit with: LE Wounds  History: DM, Dementia, CKD  Home DM Meds: None listed  Current Insulin Orders: None      MD- Patient with History of DM per H&P (diet controlled).  Please consider placing orders for Novolog Sensitive Correction Scale/ SSI (0-9 units) TID AC + HS      --Will follow patient during hospitalization--  Ambrose Finland RN, MSN, CDE Diabetes Coordinator Inpatient Glycemic Control Team Team Pager: [phone] (8a-5p)

## 2016-11-18 NOTE — Consult Note (Signed)
WOC Nurse wound consult note Reason for Consult:BLE wounds, unstageable right heel, left lateral ankle, left lateral calf Wound type: venous and arterial insufficiency, pressure Pressure Injury POA: Yes Measurement:Left lateral malleolus 5cm round x 0.1cm 50% white slough 50% black eschar, copious yellow exudate, malodorous Left lateral calf 10cm x 3.5cm x 1.2cm unstageable 100% black soft eschar, copious serosanguinous drainage, extremely malodorous. Right lateral heel pressure ulcer 5cm x  4cm x 0.1cm unstageable 30% pink non granulating, 40% yellow slough, 30% black eschar, scant yellow exudate, no odor noted. Wound bed: see above Drainage (amount, consistency, odor) see above Periwound:see above Dressing procedure/placement/frequency: I will place temporary orders but these wounds are beyond the scope of WOC nursing. Vascular has already consulted and recommended Ortho Consult with Dr. Lajoyce Corners. ABI's from this am Right 0.58, left 0.45. Please order Ortho Consult if you agree with Dr Imogene Burn, Vascular Surgeon. Pt has nutritional consult, has Prevalon boots on, has specialty mattress, intertriginous has improved greatly since last admission, no orders needed for that.  We will not follow, but will remain available to this patient, to nursing, and the medical and/or surgical teams.  Please re-consult if we need to assist further.   Barnett Hatter, RN-C, WTA-C Wound Treatment Associate

## 2016-11-18 NOTE — Progress Notes (Signed)
Initial Nutrition Assessment  DOCUMENTATION CODES:   Morbid obesity  INTERVENTION:  - Will order 30 mL Prostat BID, each supplement provides 100 kcal and 15 grams of protein. - Continue to encourage PO intakes of meals and supplement. - RD will continue to monitor for additional nutrition-related needs.   NUTRITION DIAGNOSIS:   Increased nutrient needs (for protein) related to wound healing as evidenced by estimated needs.  GOAL:   Patient will meet greater than or equal to 90% of their needs  MONITOR:   PO intake, Supplement acceptance, Weight trends, Labs, Skin  REASON FOR ASSESSMENT:   Consult Wound healing  ASSESSMENT:   81 y.o. female with medical history significant of hypothyroidism but currently being treated forPVD with recent stent pplacement in right superficial femoral artery and right popliteal artery;  hyperthyroidism; DM; dementia; remote CVA; and CKD presenting after being sent from her SNF with elevated sodium and an infected left leg.  She was given a shot today and had a wound culture.  They just noticed the infection today, "they said it wasn't there yesterday."  She has had a number of ulcers - R second toe ulcer since last year, treated off and on; wound on the back of her left leg, uncertain duration; left leg wound, her daughter never noticed it, so maybe present for a couple of weeks.  No fevers.  She has been hoarse at times, had an appointment last week with endocrinology for hyperthyroidism and was supposed to start medication for this.  Pt seen for consult. BMI indicates morbid obesity. No intakes documented since admission. Breakfast tray at bedside with 100% completion of juice, [place] toast, and baked apples. Pt does not have any teeth and denies having any dentures; needs softer foods related to this. She is able to feed herself. Pt denies abdominal pain or pressure or nausea at this time. She usually has a very good appetite.   Physical assessment  shows no muscle wasting, no fat wasting, and mild edema to all extremities. Per chart review, current weight stable from 10/31/16 and +11 lbs compared to 07/04/16.  Medications reviewed; 100 mg Colace BID, 325 mg ferrous sulfate/day, 1 packet Miralax/day. Labs reviewed; Na: 151 mmol/L, Cl: 119 mmol/L, BUN: 50 mg/dL, creatinine: 4.09 mg/dL, GFR: 22 mL/min.   IVF: D5 @ 75 mL/hr (306 kcal from dextrose).      Diet Order:  Diet Carb Modified Fluid consistency: Thin; Room service appropriate? Yes  Skin:   Stage 2 L abdominal, Stage 1 knee, L ankle and leg pressure injuries (with no documented staging).  Last BM:  10/4  Height:   Ht Readings from Last 1 Encounters:  11/17/16  (1.676 m)    Weight:   Wt Readings from Last 1 Encounters:  11/17/16 252 lb (114.3 kg)    Ideal Body Weight:  59.09 kg  BMI:  Body mass index is 40.67 kg/m.  Estimated Nutritional Needs:   Kcal:  1715-1945 (15-17 kcal/kg)  Protein:  115-125 grams (1-1.1 grams/kg)  Fluid:  >/= 1.7 L/day  EDUCATION NEEDS:   No education needs identified at this time    Trenton Gammon, MS, RD, LDN, CNSC Inpatient Clinical Dietitian Pager # [phone] After hours/weekend pager # [phone]

## 2016-11-18 NOTE — Consult Note (Signed)
ORTHOPAEDIC CONSULTATION  REQUESTING PHYSICIAN: Leatha Gilding, MD  Chief Complaint: Left leg ischemic pain  HPI: Sylvia Medina is a 81 y.o. female who presents with diabetic insensate neuropathy end-stage renal disease dementia with painful ulcers on the left ankle and left calf laterally.  Past Medical History:  Diagnosis Date  . Anemia 07/04/2012  . Cardiac arrhythmia 07/04/2012  . CKD (chronic kidney disease) stage 4, GFR 15-29 ml/min (HCC)   . CVA (cerebral vascular accident) (HCC) 07/04/2012  . Dementia 07/04/2012  . Diabetes mellitus type 2, diet-controlled (HCC) 07/04/2012  . Hyperthyroidism   . Unspecified constipation 07/04/2012  . Unspecified vitamin D deficiency 07/04/2012   Past Surgical History:  Procedure Laterality Date  . ABDOMINAL AORTOGRAM W/LOWER EXTREMITY N/A 10/28/2016   Procedure: ABDOMINAL AORTOGRAM W/LOWER EXTREMITY;  Surgeon: Sherren Kerns, MD;  Location: [facility];  Service: Cardiovascular;  Laterality: N/A;  . PERIPHERAL VASCULAR INTERVENTION Right 10/28/2016   Procedure: PERIPHERAL VASCULAR INTERVENTION;  Surgeon: Sherren Kerns, MD;  Location: [facility];  Service: Cardiovascular;  Laterality: Right;  Right SFA and Popliteal   Social History   Social History  . Marital status: Widowed    Spouse name: N/A  . Number of children: N/A  . Years of education: N/A   Occupational History  . retired    Social History Main Topics  . Smoking status: Former Smoker    Packs/day: 0.50    Years: 40.00  . Smokeless tobacco: Never Used  . Alcohol use No  . Drug use: No  . Sexual activity: Not Currently   Other Topics Concern  . None   Social History Narrative  . None   Family History  Problem Relation Age of Onset  . Thyroid disease Neg Hx    - negative except otherwise stated in the family history section No Known Allergies Prior to Admission medications   Medication Sig Start Date End Date Taking? Authorizing  Provider  Amino Acids-Protein Hydrolys (FEEDING SUPPLEMENT, PRO-STAT SUGAR FREE 64,) LIQD Take 30 mLs by mouth 2 (two) times daily as needed.    Yes [provider]  aspirin EC 81 MG tablet Take 1 tablet (81 mg total) by mouth daily. 10/28/16  Yes Rhyne, Samantha J, PA-C  atorvastatin (LIPITOR) 10 MG tablet Take 1 tablet (10 mg total) by mouth daily. 10/28/16  Yes Rhyne, Ames Coupe, PA-C  Cholecalciferol 1000 units tablet Take 1 tablet (1,000 Units total) by mouth daily. 08/31/15  Yes Ngetich, Dinah C, NP  clopidogrel (PLAVIX) 75 MG tablet Take 1 tablet (75 mg total) by mouth daily with breakfast. 10/29/16  Yes Rhyne, Samantha J, PA-C  collagenase (SANTYL) ointment Apply 1 application topically daily.   Yes [provider]  diclofenac sodium (VOLTAREN) 1 % GEL Apply 2 g topically 2 (two) times daily as needed (joint pain).    Yes [provider]  ferrous sulfate 325 (65 FE) MG tablet Take 325 mg by mouth daily with breakfast.    Yes [provider]  hydrochlorothiazide (HYDRODIURIL) 12.5 MG tablet Take 12.5 mg by mouth daily. Hold for SBP <110   Yes [provider]  methimazole (TAPAZOLE) 10 MG tablet Take 10 mg by mouth daily.   Yes [provider]  metoprolol (LOPRESSOR) 50 MG tablet Take 50 mg by mouth 2 (two) times daily. Hold for SBP < 110 and HR < 60   Yes [provider]  Multiple Vitamin (MULTIVITAMIN) tablet Take 1 tablet  by mouth daily.   Yes [provider]  polyethylene glycol powder (GLYCOLAX/MIRALAX) powder Take 17 g by mouth daily. Mix in 4-8 ounces of Orange Juice and take by mouth daily for constipation. 07/04/12  Yes Lucrezia Starch, NP  Rivastigmine (EXELON) 13.3 MG/24HR PT24 Place 1 patch (13.3 mg total) onto the skin daily. 07/04/12  Yes Lucrezia Starch, NP   Dg Tibia/fibula Left  Result Date: 11/17/2016 CLINICAL DATA:  Leg wound, ulcer EXAM: LEFT TIBIA AND FIBULA - 2 VIEW COMPARISON:  None. FINDINGS: No fracture is  seen. Large soft tissue ulceration distal lateral aspect of the left lower leg. Diffuse osteopenia. Possible cortical erosion distal anterior aspect of the tibia at the ankle joint. IMPRESSION: 1. Diffuse soft tissue swelling with large soft tissue deformity at the mid to distal lower leg on the lateral side. 2. Possible cortical erosive change at the distal anterior tibia at the ankle joint, cannot exclude small focus of osteomyelitis. Electronically Signed   By: Jasmine Pang M.D.   On: 11/17/2016 19:55   Dg Foot Complete Right  Result Date: 11/17/2016 CLINICAL DATA:  Foot ulcer EXAM: RIGHT FOOT COMPLETE - 3+ VIEW COMPARISON:  None. FINDINGS: The examination is limited by positioning and severe osteopenia. This limits detection for fracture. No gross acute displaced fracture or malalignment is seen. No definitive periostitis or obvious bone destruction. No soft tissue gas IMPRESSION: Marked osteopenia limits the study. No gross acute osseous abnormality Electronically Signed   By: Jasmine Pang M.D.   On: 11/17/2016 19:52   - pertinent xrays, CT, MRI studies were reviewed and independently interpreted  Positive ROS: All other systems have been reviewed and were otherwise negative with the exception of those mentioned in the HPI and as above.  Physical Exam: General: Patient does not respond to questions. She shouts out in pain when touched. Her daughter answers all questions. Psychiatric: Patient is not competent for consent with normal mood and affect Lymphatic: No axillary or cervical lymphadenopathy Cardiovascular:  pedal edema bilateral lower extremities Respiratory: No cyanosis, no use of accessory musculature GI: No organomegaly, abdomen is soft and non-tender  Skin:  Patient has dark ischemic changes to the left foot. She has a ischemic ulcer over the lateral left ankle and lateral left calf these are both about 3 cm in diameter and both probe to bone.   Neurologic: Patient does not  have protective sensation bilateral lower extremities.   MUSCULOSKELETAL:  Examination patient does not have a palpable pulse she has ischemic pain with any attempted touching or manipulation of the left lower extremity. The lateral calf ulcer probes to bone the lateral ankle ulcer ulcer probes to bone. There is no cellulitis no purulence. There is no ascending cellulitis. Patient is nonambulatory   Assessment: Assessment: Severe peripheral vascular disease and a nonambulatory patient with dementia diabetes and end-stage renal disease.  Plan: I discussed with the patient's daughter the surgical options. Discussed that the only way to relieve her pain would be a transtibial amputation since she is not a candidate for revascularization. Discussed that most likely she would require an above-the-knee amputation on the left however her soft tissue envelope is so massive her risk of the wound not healing is significant. Discussed that if patient can be made comfortable and dressing changes performed that may be a safe option for the patient. Patient's daughter states that she would like to consider her options and will consult me if she wants to proceed with surgery.  Thank you  for the consult and the opportunity to see Ms. Waynette Buttery, MD [facility] [phone] 4:09 PM

## 2016-11-18 NOTE — Progress Notes (Signed)
VASCULAR LAB PRELIMINARY  ARTERIAL  ABI completed: Right ABI of 0.58 is suggestive of moderate arterial occlusive disease at rest. Left ABI of 0.45 is suggestive of severe arterial occlusive disease at rest. Unable to obtain bilateral TBI's due to low amplitude waveforms.   RIGHT    LEFT    PRESSURE WAVEFORM  PRESSURE WAVEFORM  BRACHIAL 216 Triphasic BRACHIAL 211 Triphasic  DP 125 Monophasic DP 77 Monophasic  PT 117 Monophasic PT    PER   PER 97 Monophasic    RIGHT LEFT  ABI 0.58 0.45     Elsie Stain, RVT 11/18/2016, 9:45 AM

## 2016-11-18 NOTE — Progress Notes (Signed)
PROGRESS NOTE  Sylvia Medina ZOX:[id number] DOB: 05-29-1935 DOA: 11/17/2016 PCP: System, Pcp Not In   LOS: 1 day   Brief Narrative / Interim history: 81 y.o. female with medical history significant of PVD with recent stent pplacement in right superficial femoral artery and right popliteal artery;  hyperthyroidism; DM; dementia; remote CVA; and CKD presenting after being sent from her SNF with elevated sodium and an infected left leg.  She was given a shot today and had a wound culture.  They just noticed the infection today, "they said it wasn't there yesterday."  She has had a number of ulcers - R second toe ulcer since last year, treated off and on; wound on the back of her left leg, uncertain duration; left leg wound, her daughter never noticed it, so maybe present for a couple of weeks.  No fevers.  She has been hoarse at times, had an appointment last week with endocrinology for hyperthyroidism and was supposed to start medication for this.  Assessment & Plan: Principal Problem:   Lower extremity ulceration (HCC) Active Problems:   Diabetes mellitus type 2, diet-controlled (HCC)   Dementia without behavioral disturbance   Essential hypertension, benign   Anemia of chronic disease   Hypernatremia   CKD (chronic kidney disease) stage 4, GFR 15-29 ml/min (HCC)   Hyperthyroidism   Bilateral lower extremity ulcers -Currently appear infected with foul-smelling drainage patient was placed on antibiotics with vancomycin, ceftriaxone and metronidazole in the emergency room, continue -I have consulted orthopedic surgery, discussed with Dr. Lajoyce Corners this morning, he will see patient in consultation-patient was evaluated by Dr. Imogene Burn with vascular surgery she is status post abdominal aortogram with right lower extremity runoff with chronic total occlusion of right superficial femoral artery and above-knee popliteal artery stented to 0 residual stenosis  -Nothing to add more from a vascular surgery  standpoint per Dr. Imogene Burn, awaiting orthopedic surgery consultation -ABIs this morning 0.58 on the right and 0.45 on the left  Chronic kidney disease stage IV -Has been in stage IV throughout the 2018 year, creatinine in the 1.8-1.9 range at the beginning of 2018 but has increased to 2.6 range since last couple of months, creatinine currently is 2.28, improved from 2.5 last month -Avoid nephrotoxic agents  Peripheral vascular disease -With bilateral lower extremity ulcers, as above, continue aspirin and Plavix  Hypertension -continue metoprolol  Dementia -Continue home medications  Hyperlipidemia -Continue atorvastatin  Hypothyroidism -TSH was 0.02, however I do not see a free T3 or free T4, obtain these.  Patient was poorly responsive after receiving methimazole today, hold methimazole for now.  She is not tachycardic and does not appear to have any overt hyperthyroid symptoms   DVT prophylaxis: lovenox Code Status: Full code Family Communication: no family at bedside Disposition Plan: TBD  Consultants:   Orthopedic surgery  Procedures:   ABI  Antimicrobials:  Vancomycin 10/4 >>  Ceftriaxone 10/4 >>  Metronidazole 10/4 >>  Subjective: -Demented, has no complaints.  Seen in the morning as well as after rapid response, she is alert, does not know where she is but is oriented to self.  No chest pain, no shortness of breath.  Objective: Vitals:   11/18/16 0227 11/18/16 0358 11/18/16 0528 11/18/16 0815  BP: (!) 119/38 111/61 126/63 121/65  Pulse: (!) 51 (!) 51 61 (!) 55  Resp: Temp:   (!) 97.5 F (36.4 C)   TempSrc:   Oral   SpO2: 99% 99% 100% 99%  Weight:      Height:       No intake or output data in the 24 hours ending 11/18/16 1236 Filed Weights   11/17/16 2343  Weight: 114.3 kg (252 lb)    Examination:  Constitutional: NAD, pleasantly demented Eyes: lids and conjunctivae normal ENMT: Mucous membranes are dry.  Respiratory: clear to  auscultation bilaterally, no wheezing, no crackles. Normal respiratory effort. No accessory muscle use.  Cardiovascular: Regular rate and rhythm, no murmurs / rubs / gallops. No LE edema. Barely palpable pulses Abdomen: no tenderness. Bowel sounds positive.  Skin: Several wounds present, left lateral heel with malodorous drainage, left lateral calf, right lateral heel Neurologic: non focal Psychiatric: Alert and oriented x 1  Data Reviewed: I have independently reviewed following labs and imaging studies   CBC:  Recent Labs Lab 11/17/16 1850  WBC 12.0*  NEUTROABS 8.3*  HGB 9.5*  HCT 30.7*  MCV 83.7  PLT 215   Basic Metabolic Panel:  Recent Labs Lab 11/17/16 1850  NA 151*  K 4.2  CL 119*  CO2 23  GLUCOSE 101*  BUN 50*  CREATININE 2.28*  CALCIUM 9.0   GFR: Estimated Creatinine Clearance: 25.7 mL/min (A) (by C-G formula based on SCr of 2.28 mg/dL (H)). Liver Function Tests: No results for input(s): AST, ALT, ALKPHOS, BILITOT, PROT, ALBUMIN in the last 168 hours. No results for input(s): LIPASE, AMYLASE in the last 168 hours. No results for input(s): AMMONIA in the last 168 hours. Coagulation Profile: No results for input(s): INR, PROTIME in the last 168 hours. Cardiac Enzymes: No results for input(s): CKTOTAL, CKMB, CKMBINDEX, TROPONINI in the last 168 hours. BNP (last 3 results) No results for input(s): PROBNP in the last 8760 hours. HbA1C: No results for input(s): HGBA1C in the last 72 hours. CBG:  Recent Labs Lab 11/18/16 1209  GLUCAP 129*   Lipid Profile: No results for input(s): CHOL, HDL, LDLCALC, TRIG, CHOLHDL, LDLDIRECT in the last 72 hours. Thyroid Function Tests: No results for input(s): TSH, T4TOTAL, FREET4, T3FREE, THYROIDAB in the last 72 hours. Anemia Panel: No results for input(s): VITAMINB12, FOLATE, FERRITIN, TIBC, IRON, RETICCTPCT in the last 72 hours. Urine analysis:    Component Value Date/Time   COLORURINE YELLOW 11/17/2016 1808    APPEARANCEUR HAZY (A) 11/17/2016 1808   LABSPEC 1.012 11/17/2016 1808   PHURINE 6.0 11/17/2016 1808   GLUCOSEU NEGATIVE 11/17/2016 1808   HGBUR MODERATE (A) 11/17/2016 1808   BILIRUBINUR NEGATIVE 11/17/2016 1808   KETONESUR NEGATIVE 11/17/2016 1808   PROTEINUR 30 (A) 11/17/2016 1808   NITRITE POSITIVE (A) 11/17/2016 1808   LEUKOCYTESUR LARGE (A) 11/17/2016 1808   Sepsis Labs: Invalid input(s): PROCALCITONIN, LACTICIDVEN  No results found for this or any previous visit (from the past 240 hour(s)).    Radiology Studies: Dg Tibia/fibula Left  Result Date: 11/17/2016 CLINICAL DATA:  Leg wound, ulcer EXAM: LEFT TIBIA AND FIBULA - 2 VIEW COMPARISON:  None. FINDINGS: No fracture is seen. Large soft tissue ulceration distal lateral aspect of the left lower leg. Diffuse osteopenia. Possible cortical erosion distal anterior aspect of the tibia at the ankle joint. IMPRESSION: 1. Diffuse soft tissue swelling with large soft tissue deformity at the mid to distal lower leg on the lateral side. 2. Possible cortical erosive change at the distal anterior tibia at the ankle joint, cannot exclude small focus of osteomyelitis. Electronically Signed   By: Jasmine Pang M.D.   On: 11/17/2016 19:55   Dg Foot Complete Right  Result  Date: 11/17/2016 CLINICAL DATA:  Foot ulcer EXAM: RIGHT FOOT COMPLETE - 3+ VIEW COMPARISON:  None. FINDINGS: The examination is limited by positioning and severe osteopenia. This limits detection for fracture. No gross acute displaced fracture or malalignment is seen. No definitive periostitis or obvious bone destruction. No soft tissue gas IMPRESSION: Marked osteopenia limits the study. No gross acute osseous abnormality Electronically Signed   By: Jasmine Pang M.D.   On: 11/17/2016 19:52     Scheduled Meds: . aspirin EC  81 mg Oral Daily  . atorvastatin  10 mg Oral Daily  . clopidogrel  75 mg Oral Q breakfast  . docusate sodium  100 mg Oral BID  . enoxaparin (LOVENOX)  injection  30 mg Subcutaneous Q24H  . feeding supplement (PRO-STAT SUGAR FREE 64)  30 mL Oral BID  . ferrous sulfate  325 mg Oral Q breakfast  . methimazole  10 mg Oral Daily  . metoprolol tartrate  50 mg Oral BID  . metroNIDAZOLE  500 mg Oral Q8H  . polyethylene glycol  17 g Oral Daily  . rivastigmine  13.3 mg Transdermal Daily   Continuous Infusions: . cefTRIAXone (ROCEPHIN)  IV Stopped (11/18/16 0036)  . dextrose 75 mL/hr at 11/17/16 2340  . [START ON 11/19/2016] vancomycin       Pamella Pert, MD, PhD [facility] Pager [phone]  If 7PM-7AM, please contact night-coverage [email or web address] Password [email or web address] 11/18/2016, 12:36 PM

## 2016-11-18 NOTE — ED Notes (Signed)
Pt transported to [facility] via [facility]

## 2016-11-19 LAB — COMPREHENSIVE METABOLIC PANEL
ALBUMIN: 1.7 g/dL — AB (ref 3.5–5.0)
ALK PHOS: 222 U/L — AB (ref 38–126)
ALT: 51 U/L (ref 14–54)
ANION GAP: 10 (ref 5–15)
AST: 123 U/L — ABNORMAL HIGH (ref 15–41)
BILIRUBIN TOTAL: 0.6 mg/dL (ref 0.3–1.2)
BUN: 40 mg/dL — ABNORMAL HIGH (ref 6–20)
CALCIUM: 8.3 mg/dL — AB (ref 8.9–10.3)
CO2: 20 mmol/L — AB (ref 22–32)
CREATININE: 1.93 mg/dL — AB (ref 0.44–1.00)
Chloride: 110 mmol/L (ref 101–111)
GFR calc Af Amer: 27 mL/min — ABNORMAL LOW (ref >60)
GFR calc non Af Amer: 24 mL/min — ABNORMAL LOW (ref >60)
GLUCOSE: 100 mg/dL — AB (ref 65–99)
Potassium: 3.2 mmol/L — ABNORMAL LOW (ref 3.5–5.1)
Sodium: 140 mmol/L (ref 135–145)
TOTAL PROTEIN: 5.6 g/dL — AB (ref 6.5–8.1)

## 2016-11-19 LAB — CBC
HCT: 27.6 % — ABNORMAL LOW (ref 36.0–46.0)
HEMOGLOBIN: 8.5 g/dL — AB (ref 12.0–15.0)
MCH: 25.3 pg — AB (ref 26.0–34.0)
MCHC: 30.8 g/dL (ref 30.0–36.0)
MCV: 82.1 fL (ref 78.0–100.0)
PLATELETS: 196 10*3/uL (ref 150–400)
RBC: 3.36 MIL/uL — ABNORMAL LOW (ref 3.87–5.11)
RDW: 18.9 % — AB (ref 11.5–15.5)
WBC: 10.9 10*3/uL — ABNORMAL HIGH (ref 4.0–10.5)

## 2016-11-19 LAB — T3, FREE: T3, Free: 2.4 pg/mL (ref 2.0–4.4)

## 2016-11-19 LAB — GLUCOSE, CAPILLARY: GLUCOSE-CAPILLARY: 94 mg/dL (ref 65–99)

## 2016-11-19 LAB — MRSA PCR SCREENING: MRSA by PCR: NEGATIVE

## 2016-11-19 LAB — CORTISOL: Cortisol, Plasma: 14.1 ug/dL

## 2016-11-19 LAB — TSH: TSH: 3.374 u[IU]/mL (ref 0.350–4.500)

## 2016-11-19 MED ORDER — SODIUM CHLORIDE 0.9 % IV SOLN: INTRAVENOUS | Status: DC

## 2016-11-19 MED ORDER — POTASSIUM CHLORIDE CRYS ER 20 MEQ PO TBCR
40.0000 meq | EXTENDED_RELEASE_TABLET | Freq: Once | ORAL | Status: AC
  Administered 2016-11-19: 40 meq via ORAL
  Filled 2016-11-19: qty 2

## 2016-11-19 NOTE — Progress Notes (Signed)
Follow-up:  Multiple notifications since 2200 regarding pt's hypothermia. Current rectal temp 94.1 from 94.8 at 2132 despite multiple warmnng measures. Remaining VSS. Orders placed for transfer to SDU for [employer]. Will continue to monitor closely.   Leanne Chang, NP-C [facility] Pager [phone]

## 2016-11-19 NOTE — Progress Notes (Signed)
Patient has been moved to 3M12.

## 2016-11-19 NOTE — Progress Notes (Signed)
Report to [facility] pt.'s rectal temp. 94.8 and tried axillary temps several times and will not register; and tried oral temp. also and same; pt. feels very warm.  Sharon,RN will page Dr.

## 2016-11-19 NOTE — Care Management Note (Signed)
Case Management Note  Patient Details  Name: LESHIA KOPE MRN: [id number] Date of Birth: 01/16/1936  Subjective/Objective:                 Patient admitted for ischemic left leg pain and BLE wounds. RR called 10/5, patient moved to step down unit. PCP: Blanch[facility] Mane Consultants:  Imogene Burn - vascular; Everardo All - endocrinology Patient coming from: [facility]; NOK: daughter, [phone]   Action/Plan:  CM will continue to follow.  Expected Discharge Date:                  Expected Discharge Plan:  Skilled Nursing Facility  In-House Referral:  Clinical Social Work  Discharge planning Services  CM Consult  Post Acute Care Choice:    Choice offered to:     DME Arranged:    DME Agency:     HH Arranged:    HH Agency:     Status of Service:  In process, will continue to follow  If discussed at Long Length of Stay Meetings, dates discussed:    Additional Comments:  Lawerance Sabal, RN 11/19/2016, 9:05 AM

## 2016-11-19 NOTE — Progress Notes (Signed)
Shift change at 2300, pt reported to have been running low temperature 94.8 rectal.  On call contacted, received orders to warm up the room and apply multiple warm blankets.  Re-check temp within 2 hours.  At 0237 T 94.1 rectal.  Patient is alert and oriented to self and place.  New orders to move patient to step down 39M12 for [facility] blanket treatment.  Report already given to 39M nurse.  Will contact patient's family before 7 am to notify them of patient's change in care.

## 2016-11-19 NOTE — Progress Notes (Addendum)
PROGRESS NOTE  Sylvia Medina:[id number] DOB: 09/07/1935 DOA: 11/17/2016 PCP: System, Pcp Not In   LOS: 2 days   Brief Narrative / Interim history: 81 y.o. female with medical history significant of PVD with recent stent pplacement in right superficial femoral artery and right popliteal artery;  hyperthyroidism; DM; dementia; remote CVA; and CKD presenting after being sent from her SNF with elevated sodium and an infected left leg.  She was given a shot today and had a wound culture.  They just noticed the infection today, "they said it wasn't there yesterday."  She has had a number of ulcers - R second toe ulcer since last year, treated off and on; wound on the back of her left leg, uncertain duration; left leg wound, her daughter never noticed it, so maybe present for a couple of weeks.  No fevers.  She has been hoarse at times, had an appointment last week with endocrinology for hyperthyroidism and was supposed to start medication for this.  Assessment & Plan: Principal Problem:   Lower extremity ulceration (HCC) Active Problems:   Diabetes mellitus type 2, diet-controlled (HCC)   Dementia without behavioral disturbance   Essential hypertension, benign   Anemia of chronic disease   Hypernatremia   CKD (chronic kidney disease) stage 4, GFR 15-29 ml/min (HCC)   Hyperthyroidism   Bilateral lower extremity ulcers -Currently appear infected with foul-smelling drainage patient was placed on antibiotics with vancomycin, ceftriaxone and metronidazole in the emergency room, continue -I have consulted orthopedic surgery, discussed with Dr. Lajoyce Corners today again, he recommends amputation however even following that the amputation wound itself may not heal. -Nothing to add more from a vascular surgery standpoint per Dr. Imogene Burn, awaiting orthopedic surgery consultation -ABIs 0.58 on the right and 0.45 on the left, will touch base with Dr. Imogene Burn on Monday regarding these results -Discussed with  daughter over the phone today, there is no great solution here, she is still undecided whether she wants to go with conservative management versus surgical amputation  Hypernatremia -Improved with fluids  Chronic kidney disease stage IV -Has been in stage IV throughout the 2018 year, creatinine in the 1.8-1.9 range at the beginning of 2018 but has increased to 2.6 range since last couple of months, creatinine on admission is 2.28, improved from 2.5 last month -Avoid nephrotoxic agents, slightly improvement today to 1.9  Hypothermia -Patient had an episode of hypothermia on 10/5 and rapid response was called, however on physical exam she was warm to touch, and a repeat oral temperature was 97.4.  She seemed to be having another hypothermia episode overnight, and she was transferred to stepdown.  Currently she has a bear hugger on.  Check a TSH, check cortisol, suspect hypothermia related to systemic infection as above.  This was discussed with daughter as well  Peripheral vascular disease -With bilateral lower extremity ulcers, as above, continue aspirin and Plavix  Hypertension -continue metoprolol  Dementia -Continue home medications  Hyperlipidemia -Continue atorvastatin  Hyperthyroidism -TSH was 0.02, however I do not see a free T3 or free T4, obtain these.  Patient was poorly responsive after receiving methimazole today, hold methimazole for now.  She is not tachycardic and does not appear to have any overt hyperthyroid symptoms   DVT prophylaxis: lovenox Code Status: Full code Family Communication: no family at bedside, discussed with daughter over the phone Disposition Plan: TBD  Consultants:   Orthopedic surgery  Procedures:   ABI  Antimicrobials:  Vancomycin 10/4 >>  Ceftriaxone 10/4 >>  Metronidazole 10/4 >>  Subjective: -Underlying dementia, she is pleasant and has no complaints.  She is alert to self.  Denies any pain  Objective: Vitals:   11/19/16  0855 11/19/16 0900 11/19/16 0915 11/19/16 1146  BP: (!) 128/41 (!) 119/48  (!) 109/50  Pulse: (!) 57 (!) 56 60 (!) 53  Resp: Temp:  (!) 94.9 F (34.9 C)  (!) 96.8 F (36 C)  TempSrc:  Rectal  Oral  SpO2: 98% 99%    Weight:      Height:        Intake/Output Summary (Last 24 hours) at 11/19/16 1319 Last data filed at 11/18/16 2217  Gross per 24 hour  Intake              120 ml  Output                0 ml  Net              120 ml   Filed Weights   11/17/16 2343 11/19/16 0549  Weight: 114.3 kg (252 lb) 116.2 kg (256 lb 1.6 oz)    Examination:  Constitutional: No apparent distress, pleasantly demented Eyes: No scleral icterus ENMT: Dry mucous membranes Respiratory: Clear to auscultation bilaterally, no wheezing. Cardiovascular: Regular rate and rhythm, no murmurs.  No edema. Abdomen: No tenderness, bowel sounds positive Skin: No rashes, wounds not examined today Neurologic: No focal findings Psychiatric: Alert and oriented to person only  Data Reviewed: I have independently reviewed following labs and imaging studies   CBC:  Recent Labs Lab 11/17/16 1850 11/18/16 1415 11/19/16 0428  WBC 12.0* 13.5* 10.9*  NEUTROABS 8.3*  --   --   HGB 9.5* 8.4* 8.5*  HCT 30.7* 27.5* 27.6*  MCV 83.7 83.6 82.1  PLT 215 188 196   Basic Metabolic Panel:  Recent Labs Lab 11/17/16 1850 11/18/16 1415 11/19/16 0428  NA 151* 143 140  K 4.2 3.8 3.2*  CL 119* 118* 110  CO2 23 18* 20*  GLUCOSE 101* 136* 100*  BUN 50* 41* 40*  CREATININE 2.28* 2.12* 1.93*  CALCIUM 9.0 8.4* 8.3*   GFR: Estimated Creatinine Clearance: 30.6 mL/min (A) (by C-G formula based on SCr of 1.93 mg/dL (H)). Liver Function Tests:  Recent Labs Lab 11/18/16 1415 11/19/16 0428  AST 45* 123*  ALT 28 51  ALKPHOS 163* 222*  BILITOT 0.8 0.6  PROT 5.9* 5.6*  ALBUMIN 1.8* 1.7*   No results for input(s): LIPASE, AMYLASE in the last 168 hours. No results for input(s): AMMONIA in the last 168  hours. Coagulation Profile: No results for input(s): INR, PROTIME in the last 168 hours. Cardiac Enzymes: No results for input(s): CKTOTAL, CKMB, CKMBINDEX, TROPONINI in the last 168 hours. BNP (last 3 results) No results for input(s): PROBNP in the last 8760 hours. HbA1C: No results for input(s): HGBA1C in the last 72 hours. CBG:  Recent Labs Lab 11/18/16 1209 11/19/16 0352  GLUCAP 129* 94   Lipid Profile: No results for input(s): CHOL, HDL, LDLCALC, TRIG, CHOLHDL, LDLDIRECT in the last 72 hours. Thyroid Function Tests:  Recent Labs  11/18/16 1415 11/19/16 0800  TSH  --  3.374  FREET4 0.83  --   T3FREE 2.4  --    Anemia Panel: No results for input(s): VITAMINB12, FOLATE, FERRITIN, TIBC, IRON, RETICCTPCT in the last 72 hours. Urine analysis:    Component Value Date/Time   COLORURINE YELLOW 11/17/2016 1808   APPEARANCEUR HAZY (  A) 11/17/2016 1808   LABSPEC 1.012 11/17/2016 1808   PHURINE 6.0 11/17/2016 1808   GLUCOSEU NEGATIVE 11/17/2016 1808   HGBUR MODERATE (A) 11/17/2016 1808   BILIRUBINUR NEGATIVE 11/17/2016 1808   KETONESUR NEGATIVE 11/17/2016 1808   PROTEINUR 30 (A) 11/17/2016 1808   NITRITE POSITIVE (A) 11/17/2016 1808   LEUKOCYTESUR LARGE (A) 11/17/2016 1808   Sepsis Labs: Invalid input(s): PROCALCITONIN, LACTICIDVEN  Recent Results (from the past 240 hour(s))  Blood Cultures x 2 sites     Status: None (Preliminary result)   Collection Time: 11/17/16 10:32 PM  Result Value Ref Range Status   Specimen Description BLOOD RIGHT FOREARM  Final   Special Requests   Final    BOTTLES DRAWN AEROBIC AND ANAEROBIC Blood Culture adequate volume   Culture   Final    NO GROWTH 1 DAY Performed at [facility], [address] [place], [place] [address]    Report Status PENDING  Incomplete  Blood Cultures x 2 sites     Status: None (Preliminary result)   Collection Time: 11/17/16 10:37 PM  Result Value Ref Range Status   Specimen Description BLOOD LEFT  ANTECUBITAL  Final   Special Requests   Final    BOTTLES DRAWN AEROBIC AND ANAEROBIC Blood Culture adequate volume   Culture   Final    NO GROWTH 1 DAY Performed at [facility], [address], [place], [place] [address]    Report Status PENDING  Incomplete  MRSA PCR Screening     Status: None   Collection Time: 11/19/16  6:02 AM  Result Value Ref Range Status   MRSA by PCR NEGATIVE NEGATIVE Final    Comment:        The GeneXpert MRSA Assay (FDA approved for NASAL specimens only), is one component of a comprehensive MRSA colonization surveillance program. It is not intended to diagnose MRSA infection nor to guide or monitor treatment for MRSA infections.       Radiology Studies: Dg Tibia/fibula Left  Result Date: 11/17/2016 CLINICAL DATA:  Leg wound, ulcer EXAM: LEFT TIBIA AND FIBULA - 2 VIEW COMPARISON:  None. FINDINGS: No fracture is seen. Large soft tissue ulceration distal lateral aspect of the left lower leg. Diffuse osteopenia. Possible cortical erosion distal anterior aspect of the tibia at the ankle joint. IMPRESSION: 1. Diffuse soft tissue swelling with large soft tissue deformity at the mid to distal lower leg on the lateral side. 2. Possible cortical erosive change at the distal anterior tibia at the ankle joint, cannot exclude small focus of osteomyelitis. Electronically Signed   By: Jasmine Pang M.D.   On: 11/17/2016 19:55   Dg Foot Complete Right  Result Date: 11/17/2016 CLINICAL DATA:  Foot ulcer EXAM: RIGHT FOOT COMPLETE - 3+ VIEW COMPARISON:  None. FINDINGS: The examination is limited by positioning and severe osteopenia. This limits detection for fracture. No gross acute displaced fracture or malalignment is seen. No definitive periostitis or obvious bone destruction. No soft tissue gas IMPRESSION: Marked osteopenia limits the study. No gross acute osseous abnormality Electronically Signed   By: Jasmine Pang M.D.   On: 11/17/2016 19:52     Scheduled  Meds: . aspirin EC  81 mg Oral Daily  . atorvastatin  10 mg Oral Daily  . clopidogrel  75 mg Oral Q breakfast  . docusate sodium  100 mg Oral BID  . enoxaparin (LOVENOX) injection  30 mg Subcutaneous Q24H  . feeding supplement (PRO-STAT SUGAR FREE 64)  30 mL Oral BID  . ferrous sulfate  325 mg Oral Q breakfast  . metoprolol tartrate  50 mg Oral BID  . metroNIDAZOLE  500 mg Oral Q8H  . polyethylene glycol  17 g Oral Daily  . rivastigmine  13.3 mg Transdermal Daily   Continuous Infusions: . cefTRIAXone (ROCEPHIN)  IV Stopped (11/19/16 0252)  . dextrose 75 mL/hr at 11/19/16 0616  . vancomycin       Pamella Pert, MD, PhD [facility] Pager [phone]  If 7PM-7AM, please contact night-coverage [email or web address] Password [email or web address] 11/19/2016, 1:19 PM

## 2016-11-19 NOTE — Progress Notes (Signed)
Spoke with patient's daughter Kenlee Vogt [phone].  Gave her information regarding patient's change of care.  Informed her of new location of patient.  Answered her questions.  She was thankful for update.

## 2016-11-19 NOTE — Plan of Care (Signed)
Problem: Skin Integrity: Goal: Risk for impaired skin integrity will decrease Outcome: Not Progressing New adm; on going to early to evaluate.

## 2016-11-20 LAB — BASIC METABOLIC PANEL
Anion gap: 10 (ref 5–15)
BUN: 41 mg/dL — AB (ref 6–20)
CO2: 18 mmol/L — ABNORMAL LOW (ref 22–32)
CREATININE: 2.18 mg/dL — AB (ref 0.44–1.00)
Calcium: 8.2 mg/dL — ABNORMAL LOW (ref 8.9–10.3)
Chloride: 109 mmol/L (ref 101–111)
GFR calc Af Amer: 24 mL/min — ABNORMAL LOW (ref >60)
GFR, EST NON AFRICAN AMERICAN: 20 mL/min — AB (ref >60)
GLUCOSE: 106 mg/dL — AB (ref 65–99)
POTASSIUM: 3.6 mmol/L (ref 3.5–5.1)
SODIUM: 137 mmol/L (ref 135–145)

## 2016-11-20 LAB — CBC
HEMATOCRIT: 26.4 % — AB (ref 36.0–46.0)
Hemoglobin: 8.2 g/dL — ABNORMAL LOW (ref 12.0–15.0)
MCH: 25.2 pg — ABNORMAL LOW (ref 26.0–34.0)
MCHC: 31.1 g/dL (ref 30.0–36.0)
MCV: 81.2 fL (ref 78.0–100.0)
PLATELETS: 277 10*3/uL (ref 150–400)
RBC: 3.25 MIL/uL — ABNORMAL LOW (ref 3.87–5.11)
RDW: 18.6 % — AB (ref 11.5–15.5)
WBC: 13.5 10*3/uL — AB (ref 4.0–10.5)

## 2016-11-20 NOTE — Progress Notes (Addendum)
PROGRESS NOTE   ADDALYNN Medina ZOX:[id number] DOB: 1936/01/05 DOA: 11/17/2016 PCP: System, Pcp Not In   LOS: 3 days   Brief Narrative / Interim history: 81 y.o. female with medical history significant of PVD with recent stent pplacement in right superficial femoral artery and right popliteal artery;  hyperthyroidism; DM; dementia; remote CVA; and CKD presenting after being sent from her SNF with elevated sodium and an infected left leg.  She was given a shot today and had a wound culture.  They just noticed the infection today, "they said it wasn't there yesterday."  She has had a number of ulcers - R second toe ulcer since last year, treated off and on; wound on the back of her left leg, uncertain duration; left leg wound, her daughter never noticed it, so maybe present for a couple of weeks.  No fevers.  She has been hoarse at times, had an appointment last week with endocrinology for hyperthyroidism and was supposed to start medication for this.  Assessment & Plan: Principal Problem:   Lower extremity ulceration (HCC) Active Problems:   Diabetes mellitus type 2, diet-controlled (HCC)   Dementia without behavioral disturbance   Essential hypertension, benign   Anemia of chronic disease   Hypernatremia   CKD (chronic kidney disease) stage 4, GFR 15-29 ml/min (HCC)   Hyperthyroidism   Bilateral lower extremity ulcers -Currently appear infected with foul-smelling drainage patient was placed on antibiotics with vancomycin, ceftriaxone and metronidazole in the emergency room, continue -I have consulted orthopedic surgery, discussed with Dr. Lajoyce Corners today again, he recommends amputation however even following that the amputation wound itself may not heal. -Nothing to add more from a vascular surgery standpoint per Dr. Imogene Burn, however will try to touch base again with him on Monday -ABIs 0.58 on the right and 0.45 on the left -Long discussion at bedside this morning with the patient's  daughter, will get vascular surgery's input tomorrow, she will discuss with her brother regarding best option to move forward and whether they will do conservative management versus amputation   Hypernatremia -Improved with fluids  Chronic kidney disease stage IV -Has been in stage IV throughout the 2018 year, creatinine in the 1.8-1.9 range at the beginning of 2018 but has increased to 2.6 range since last couple of months, creatinine on admission is 2.28, improved from 2.5 last month -Avoid nephrotoxic agents, slightly improvement to 1.9 on 10/6, increasing to 2.1 again today.  Hypothermia -Patient had an episode of hypothermia on 10/5 and rapid response was called, however on physical exam she was warm to touch, and a repeat oral temperature was 97.4.  She seemed to be having another hypothermia episode overnight, and she was transferred to stepdown.   -Her temperature improved since yesterday afternoon, spent the night without the bear hugger on, continue to monitor and stepdown and if she is stable may be able to transfer to the floor tomorrow -TSH, cortisol normal  Peripheral vascular disease -With bilateral lower extremity ulcers, as above, continue aspirin and Plavix  Hypertension -continue metoprolol  Dementia -Continue home medications  Hyperlipidemia -Continue atorvastatin  Hyperthyroidism -TSH was 0.02 several months ago, however I do not see an outpatient  free T3 or free T4 -Obtain these, TSH normal, free T3 and T4 normal.  She has not started methimazole as an outpatient, and there is currently no role for methimazole. Recheck TFTs as an outpatient in several weeks.   DVT prophylaxis: lovenox Code Status: Full code Family Communication: Discussed with  daughter at bedside Disposition Plan: TBD  Consultants:   Orthopedic surgery  Procedures:   ABI  Antimicrobials:  Vancomycin 10/4 >>  Ceftriaxone 10/4 >>  Metronidazole 10/4 >>  Subjective: -Pleasant  this morning, no complaints, denies any chest pain, denies any shortness of breath.  She denies abdominal pain, nausea or vomiting.  She has no leg pain.  Objective: Vitals:   11/20/16 0900 11/20/16 1000 11/20/16 1100 11/20/16 1232  BP: (!) 109/47 (!) 108/39    Pulse: 72 76    Resp: 20 20    Temp:   97.6 F (36.4 C) 97.6 F (36.4 C)  TempSrc:   Oral Oral  SpO2: 100% 99% 98%   Weight:      Height:        Intake/Output Summary (Last 24 hours) at 11/20/16 1336 Last data filed at 11/20/16 1233  Gross per 24 hour  Intake             2670 ml  Output             1750 ml  Net              920 ml   Filed Weights   11/17/16 2343 11/19/16 0549  Weight: 114.3 kg (252 lb) 116.2 kg (256 lb 1.6 oz)    Examination:  Constitutional: NAD, calm, comfortable Eyes: PERRL, lids and conjunctivae normal Respiratory: clear to auscultation bilaterally, no wheezing, no crackles.  Cardiovascular: Regular rate and rhythm, no murmurs / rubs / gallops. No LE edema.  Abdomen: no tenderness. Bowel sounds positive.  Skin: Left lateral heel wound with malodorous drainage, left lateral calf, right lateral heel Neurologic: non focal   Data Reviewed: I have independently reviewed following labs and imaging studies   CBC:  Recent Labs Lab 11/17/16 1850 11/18/16 1415 11/19/16 0428 11/20/16 0412  WBC 12.0* 13.5* 10.9* 13.5*  NEUTROABS 8.3*  --   --   --   HGB 9.5* 8.4* 8.5* 8.2*  HCT 30.7* 27.5* 27.6* 26.4*  MCV 83.7 83.6 82.1 81.2  PLT 215 188 196 277   Basic Metabolic Panel:  Recent Labs Lab 11/17/16 1850 11/18/16 1415 11/19/16 0428 11/20/16 0412  NA 151* 143 140 137  K 4.2 3.8 3.2* 3.6  CL 119* 118* 110 109  CO2 23 18* 20* 18*  GLUCOSE 101* 136* 100* 106*  BUN 50* 41* 40* 41*  CREATININE 2.28* 2.12* 1.93* 2.18*  CALCIUM 9.0 8.4* 8.3* 8.2*   GFR: Estimated Creatinine Clearance: 27.1 mL/min (A) (by C-G formula based on SCr of 2.18 mg/dL (H)). Liver Function Tests:  Recent  Labs Lab 11/18/16 1415 11/19/16 0428  AST 45* 123*  ALT 28 51  ALKPHOS 163* 222*  BILITOT 0.8 0.6  PROT 5.9* 5.6*  ALBUMIN 1.8* 1.7*   No results for input(s): LIPASE, AMYLASE in the last 168 hours. No results for input(s): AMMONIA in the last 168 hours. Coagulation Profile: No results for input(s): INR, PROTIME in the last 168 hours. Cardiac Enzymes: No results for input(s): CKTOTAL, CKMB, CKMBINDEX, TROPONINI in the last 168 hours. BNP (last 3 results) No results for input(s): PROBNP in the last 8760 hours. HbA1C: No results for input(s): HGBA1C in the last 72 hours. CBG:  Recent Labs Lab 11/18/16 1209 11/19/16 0352  GLUCAP 129* 94   Lipid Profile: No results for input(s): CHOL, HDL, LDLCALC, TRIG, CHOLHDL, LDLDIRECT in the last 72 hours. Thyroid Function Tests:  Recent Labs  11/18/16 1415 11/19/16 0800  TSH  --  3.374  FREET4 0.83  --   T3FREE 2.4  --    Anemia Panel: No results for input(s): VITAMINB12, FOLATE, FERRITIN, TIBC, IRON, RETICCTPCT in the last 72 hours. Urine analysis:    Component Value Date/Time   COLORURINE YELLOW 11/17/2016 1808   APPEARANCEUR HAZY (A) 11/17/2016 1808   LABSPEC 1.012 11/17/2016 1808   PHURINE 6.0 11/17/2016 1808   GLUCOSEU NEGATIVE 11/17/2016 1808   HGBUR MODERATE (A) 11/17/2016 1808   BILIRUBINUR NEGATIVE 11/17/2016 1808   KETONESUR NEGATIVE 11/17/2016 1808   PROTEINUR 30 (A) 11/17/2016 1808   NITRITE POSITIVE (A) 11/17/2016 1808   LEUKOCYTESUR LARGE (A) 11/17/2016 1808   Sepsis Labs: Invalid input(s): PROCALCITONIN, LACTICIDVEN  Recent Results (from the past 240 hour(s))  Blood Cultures x 2 sites     Status: None (Preliminary result)   Collection Time: 11/17/16 10:32 PM  Result Value Ref Range Status   Specimen Description BLOOD RIGHT FOREARM  Final   Special Requests   Final    BOTTLES DRAWN AEROBIC AND ANAEROBIC Blood Culture adequate volume   Culture   Final    NO GROWTH 1 DAY Performed at [facility], [address] [place], [place] [address]    Report Status PENDING  Incomplete  Blood Cultures x 2 sites     Status: None (Preliminary result)   Collection Time: 11/17/16 10:37 PM  Result Value Ref Range Status   Specimen Description BLOOD LEFT ANTECUBITAL  Final   Special Requests   Final    BOTTLES DRAWN AEROBIC AND ANAEROBIC Blood Culture adequate volume   Culture   Final    NO GROWTH 1 DAY Performed at [facility], [address] [place], [place] [address]    Report Status PENDING  Incomplete  MRSA PCR Screening     Status: None   Collection Time: 11/19/16  6:02 AM  Result Value Ref Range Status   MRSA by PCR NEGATIVE NEGATIVE Final    Comment:        The GeneXpert MRSA Assay (FDA approved for NASAL specimens only), is one component of a comprehensive MRSA colonization surveillance program. It is not intended to diagnose MRSA infection nor to guide or monitor treatment for MRSA infections.       Radiology Studies: No results found.   Scheduled Meds: . aspirin EC  81 mg Oral Daily  . atorvastatin  10 mg Oral Daily  . clopidogrel  75 mg Oral Q breakfast  . docusate sodium  100 mg Oral BID  . enoxaparin (LOVENOX) injection  30 mg Subcutaneous Q24H  . feeding supplement (PRO-STAT SUGAR FREE 64)  30 mL Oral BID  . ferrous sulfate  325 mg Oral Q breakfast  . metoprolol tartrate  50 mg Oral BID  . metroNIDAZOLE  500 mg Oral Q8H  . polyethylene glycol  17 g Oral Daily  . rivastigmine  13.3 mg Transdermal Daily   Continuous Infusions: . cefTRIAXone (ROCEPHIN)  IV Stopped (11/19/16 2240)  . dextrose 75 mL/hr at 11/20/16 0958  . vancomycin Stopped (11/20/16 0059)    Spent: 35 minutes, more than 50% at bedside involved in counseling/discussion with patient's daughter regarding prior vascular studies, orthopedic surgeries opinion and options to move forward.  Pamella Pert, MD, PhD [facility] Pager [phone]  If 7PM-7AM, please contact  night-coverage [email or web address] Password [email or web address] 11/20/2016, 1:36 PM

## 2016-11-20 NOTE — Progress Notes (Signed)
Pharmacy Antibiotic Note  Sylvia Medina is a 81 y.o. female admitted on 11/17/2016 with suspected diabetic foot infection, with high risk of MRSA.  Pharmacy consulted on 10/4 for vancomycin dosing. Vanc/CTX/flagyl D#4 for diabetic foot infection .  BLE ulcers foul-smelling drainage - LA  1.3<1.13, Afebrile TC 97.6. WBC 13.5<10.9<13.5<12, Blood Cx's ngtd SCr 2.18<1.93<2.12   S/p  hypothermia episode overnight 10/5-6th patient was transferred to stepdown unit on 10/6 AM.  . -TSH, cortisol normal Tc 97.6, Tm 98.6  Ortho consulted-recommends amputation. Patient's family to discuss and decide on conservative management versus amputation.   Goal : VT=15-20 mg/L d/t concern for osteomyelitis  Plan: Continue Vancomycin 1500 mg IV q48h  Rocephin 2 Gm IV q24h (MD) Flagyl 500 mg po q8h (MD) F/u Scr/cultures/ vancomycin trough level at steady state ~ 11/23/16.  Height:  (167.6 cm) Weight: 256 lb 1.6 oz (116.2 kg) IBW/kg (Calculated) : 59.3  Temp (24hrs), Avg:97.9 F (36.6 C), Min:96.9 F (36.1 C), Max:98.8 F (37.1 C)   Recent Labs Lab 11/17/16 1850 11/17/16 1851 11/18/16 1415 11/19/16 0428 11/20/16 0412  WBC 12.0*  --  13.5* 10.9* 13.5*  CREATININE 2.28*  --  2.12* 1.93* 2.18*  LATICACIDVEN  --  1.13 1.3  --   --     Estimated Creatinine Clearance: 27.1 mL/min (A) (by C-G formula based on SCr of 2.18 mg/dL (H)).    No Known Allergies  Antimicrobials this admission: 10/4 zosyn >> x1 ED 10/4 vancomycin >> 10/5 rocephin >>  10/5 flagyl >>  Dose adjustments this admission:   Microbiology results:  10/4 BCx x2: ngtd 10/6 MRSA PCR: neg   Thank you for allowing pharmacy to be a part of this patient's care.  Noah [facility] 11/20/2016 3:13 PM

## 2016-11-21 LAB — CBC
HEMATOCRIT: 26.7 % — AB (ref 36.0–46.0)
Hemoglobin: 8.3 g/dL — ABNORMAL LOW (ref 12.0–15.0)
MCH: 24.8 pg — ABNORMAL LOW (ref 26.0–34.0)
MCHC: 31.1 g/dL (ref 30.0–36.0)
MCV: 79.7 fL (ref 78.0–100.0)
Platelets: 289 10*3/uL (ref 150–400)
RBC: 3.35 MIL/uL — ABNORMAL LOW (ref 3.87–5.11)
RDW: 18.5 % — AB (ref 11.5–15.5)
WBC: 14.7 10*3/uL — AB (ref 4.0–10.5)

## 2016-11-21 LAB — BASIC METABOLIC PANEL
Anion gap: 9 (ref 5–15)
BUN: 45 mg/dL — AB (ref 6–20)
CHLORIDE: 106 mmol/L (ref 101–111)
CO2: 18 mmol/L — AB (ref 22–32)
Calcium: 8.4 mg/dL — ABNORMAL LOW (ref 8.9–10.3)
Creatinine, Ser: 2.29 mg/dL — ABNORMAL HIGH (ref 0.44–1.00)
GFR calc Af Amer: 22 mL/min — ABNORMAL LOW (ref >60)
GFR calc non Af Amer: 19 mL/min — ABNORMAL LOW (ref >60)
GLUCOSE: 104 mg/dL — AB (ref 65–99)
POTASSIUM: 3.3 mmol/L — AB (ref 3.5–5.1)
SODIUM: 133 mmol/L — AB (ref 135–145)

## 2016-11-21 NOTE — Progress Notes (Signed)
PROGRESS NOTE   Sylvia Medina ZOX:[id number] DOB: December 11, 1935 DOA: 11/17/2016 PCP: System, Pcp Not In   LOS: 4 days   Brief Narrative / Interim history: 81 y.o. female with medical history significant of PVD with recent stent pplacement in right superficial femoral artery and right popliteal artery;  hyperthyroidism; DM; dementia; remote CVA; and CKD presenting after being sent from her SNF with elevated sodium and an infected left leg.  She was given a shot today and had a wound culture.  They just noticed the infection today, "they said it wasn't there yesterday."  She has had a number of ulcers - R second toe ulcer since last year, treated off and on; wound on the back of her left leg, uncertain duration; left leg wound, her daughter never noticed it, so maybe present for a couple of weeks.  No fevers.  She has been hoarse at times, had an appointment last week with endocrinology for hyperthyroidism and was supposed to start medication for this.  Assessment & Plan: Principal Problem:   Lower extremity ulceration (HCC) Active Problems:   Diabetes mellitus type 2, diet-controlled (HCC)   Dementia without behavioral disturbance   Essential hypertension, benign   Anemia of chronic disease   Hypernatremia   CKD (chronic kidney disease) stage 4, GFR 15-29 ml/min (HCC)   Hyperthyroidism   Bilateral lower extremity ulcers -Currently appear infected with foul-smelling drainage patient was placed on antibiotics with vancomycin, ceftriaxone and metronidazole in the emergency room, continue -I have consulted orthopedic surgery, discussed with Dr. Lajoyce Corners, he recommends amputation however even following that the amputation wound itself may not heal. -Nothing to add more from a vascular surgery standpoint, I discussed with Dr. Imogene Burn over the phone today -ABIs 0.58 on the right and 0.45 on the left -Long discussion at bedside over the weekend with the patient's daughter, she does not know how to  proceed forward and will discuss with her brother -If she is to proceed with amputation we will reconsult Dr. Lajoyce Corners, she wants conservative management may need a PICC line and IV antibiotics for 4-6 weeks.  Discussed with ID and we can probably do ceftriaxone and metronidazole.  She is unlikely to be colonized, she is MRSA negative  Hypernatremia -Improved with fluids  Chronic kidney disease stage IV -Has been in stage IV throughout the 2018 year, creatinine in the 1.8-1.9 range at the beginning of 2018 but has increased to 2.6 range since last couple of months, creatinine on admission is 2.28, improved from 2.5 last month -Avoid nephrotoxic agents, slightly improvement to 1.9 on 10/6, but increasing slightly since, discontinue vancomycin today as above, continue ceftriaxone and metronidazole alone  Hypothermia -Patient had an episode of hypothermia on 10/5 and rapid response was called, however on physical exam she was warm to touch, and a repeat oral temperature was 97.4.  She seemed to be having another hypothermia episode overnight, and she was transferred to stepdown.   -TSH, cortisol normal -Improved  Peripheral vascular disease -With bilateral lower extremity ulcers, as above, continue aspirin and Plavix  Hypertension -continue metoprolol  Dementia -Continue home medications  Hyperlipidemia -Continue atorvastatin  Hyperthyroidism -TSH was 0.02 several months ago, however I do not see an outpatient  free T3 or free T4 -Obtain these, TSH normal, free T3 and T4 normal.  She has not started methimazole as an outpatient, and there is currently no role for methimazole. Recheck TFTs as an outpatient in several weeks.   DVT prophylaxis: lovenox Code  Status: Full code Family Communication: No family at bedside, will discuss with daughter later in the day Disposition Plan: TBD  Consultants:   Orthopedic surgery  Procedures:   ABI  Antimicrobials:  Vancomycin 10/4 >>  10/8  Ceftriaxone 10/4 >>  Metronidazole 10/4 >>  Subjective: -Pleasantly demented, no complaints.  No chest pain, shortness of breath.  Objective: Vitals:   11/21/16 0250 11/21/16 0317 11/21/16 1036 11/21/16 1148  BP: (!) 121/55  (!) 107/40 (!) 81/65  Pulse: (!) 59  60 79  Resp: 19   16  Temp:  (!) 97.5 F (36.4 C)  (!) 97.5 F (36.4 C)  TempSrc:  Oral  Oral  SpO2: 99%   99%  Weight:      Height:        Intake/Output Summary (Last 24 hours) at 11/21/16 1212 Last data filed at 11/21/16 0900  Gross per 24 hour  Intake          2071.25 ml  Output             1600 ml  Net           471.25 ml   Filed Weights   11/17/16 2343 11/19/16 0549  Weight: 114.3 kg (252 lb) 116.2 kg (256 lb 1.6 oz)    Examination:  Constitutional: No apparent distress, calm, comfortable Eyes: Lids and conjunctivae normal, no scleral icterus Respiratory: Clear to auscultation, no wheezing, no crackles, good air movement Cardiovascular: Regular rate and rhythm, no murmurs rubs or gallops.  No edema. Abdomen: Soft, nontender.  Bowel sounds positive. Skin: Wounds not examined today Neurologic: No focal findings, moves all 4 extremities   Data Reviewed: I have independently reviewed following labs and imaging studies   CBC:  Recent Labs Lab 11/17/16 1850 11/18/16 1415 11/19/16 0428 11/20/16 0412 11/21/16 0747  WBC 12.0* 13.5* 10.9* 13.5* 14.7*  NEUTROABS 8.3*  --   --   --   --   HGB 9.5* 8.4* 8.5* 8.2* 8.3*  HCT 30.7* 27.5* 27.6* 26.4* 26.7*  MCV 83.7 83.6 82.1 81.2 79.7  PLT 215 188 196 277 289   Basic Metabolic Panel:  Recent Labs Lab 11/17/16 1850 11/18/16 1415 11/19/16 0428 11/20/16 0412 11/21/16 0747  NA 151* 143 140 137 133*  K 4.2 3.8 3.2* 3.6 3.3*  CL 119* 118* 110 109 106  CO2 23 18* 20* 18* 18*  GLUCOSE 101* 136* 100* 106* 104*  BUN 50* 41* 40* 41* 45*  CREATININE 2.28* 2.12* 1.93* 2.18* 2.29*  CALCIUM 9.0 8.4* 8.3* 8.2* 8.4*   GFR: Estimated Creatinine  Clearance: 25.8 mL/min (A) (by C-G formula based on SCr of 2.29 mg/dL (H)). Liver Function Tests:  Recent Labs Lab 11/18/16 1415 11/19/16 0428  AST 45* 123*  ALT 28 51  ALKPHOS 163* 222*  BILITOT 0.8 0.6  PROT 5.9* 5.6*  ALBUMIN 1.8* 1.7*   No results for input(s): LIPASE, AMYLASE in the last 168 hours. No results for input(s): AMMONIA in the last 168 hours. Coagulation Profile: No results for input(s): INR, PROTIME in the last 168 hours. Cardiac Enzymes: No results for input(s): CKTOTAL, CKMB, CKMBINDEX, TROPONINI in the last 168 hours. BNP (last 3 results) No results for input(s): PROBNP in the last 8760 hours. HbA1C: No results for input(s): HGBA1C in the last 72 hours. CBG:  Recent Labs Lab 11/18/16 1209 11/19/16 0352  GLUCAP 129* 94   Lipid Profile: No results for input(s): CHOL, HDL, LDLCALC, TRIG, CHOLHDL, LDLDIRECT in the last 72  hours. Thyroid Function Tests:  Recent Labs  11/18/16 1415 11/19/16 0800  TSH  --  3.374  FREET4 0.83  --   T3FREE 2.4  --    Anemia Panel: No results for input(s): VITAMINB12, FOLATE, FERRITIN, TIBC, IRON, RETICCTPCT in the last 72 hours. Urine analysis:    Component Value Date/Time   COLORURINE YELLOW 11/17/2016 1808   APPEARANCEUR HAZY (A) 11/17/2016 1808   LABSPEC 1.012 11/17/2016 1808   PHURINE 6.0 11/17/2016 1808   GLUCOSEU NEGATIVE 11/17/2016 1808   HGBUR MODERATE (A) 11/17/2016 1808   BILIRUBINUR NEGATIVE 11/17/2016 1808   KETONESUR NEGATIVE 11/17/2016 1808   PROTEINUR 30 (A) 11/17/2016 1808   NITRITE POSITIVE (A) 11/17/2016 1808   LEUKOCYTESUR LARGE (A) 11/17/2016 1808   Sepsis Labs: Invalid input(s): PROCALCITONIN, LACTICIDVEN  Recent Results (from the past 240 hour(s))  Blood Cultures x 2 sites     Status: None (Preliminary result)   Collection Time: 11/17/16 10:32 PM  Result Value Ref Range Status   Specimen Description BLOOD RIGHT FOREARM  Final   Special Requests   Final    BOTTLES DRAWN AEROBIC AND  ANAEROBIC Blood Culture adequate volume   Culture   Final    NO GROWTH 2 DAYS Performed at [facility], [address], [place], [place] [address]    Report Status PENDING  Incomplete  Blood Cultures x 2 sites     Status: None (Preliminary result)   Collection Time: 11/17/16 10:37 PM  Result Value Ref Range Status   Specimen Description BLOOD LEFT ANTECUBITAL  Final   Special Requests   Final    BOTTLES DRAWN AEROBIC AND ANAEROBIC Blood Culture adequate volume   Culture   Final    NO GROWTH 2 DAYS Performed at [facility], [address] [place], [place] [address]    Report Status PENDING  Incomplete  MRSA PCR Screening     Status: None   Collection Time: 11/19/16  6:02 AM  Result Value Ref Range Status   MRSA by PCR NEGATIVE NEGATIVE Final    Comment:        The GeneXpert MRSA Assay (FDA approved for NASAL specimens only), is one component of a comprehensive MRSA colonization surveillance program. It is not intended to diagnose MRSA infection nor to guide or monitor treatment for MRSA infections.       Radiology Studies: No results found.   Scheduled Meds: . aspirin EC  81 mg Oral Daily  . atorvastatin  10 mg Oral Daily  . clopidogrel  75 mg Oral Q breakfast  . docusate sodium  100 mg Oral BID  . enoxaparin (LOVENOX) injection  30 mg Subcutaneous Q24H  . feeding supplement (PRO-STAT SUGAR FREE 64)  30 mL Oral BID  . ferrous sulfate  325 mg Oral Q breakfast  . metoprolol tartrate  50 mg Oral BID  . metroNIDAZOLE  500 mg Oral Q8H  . polyethylene glycol  17 g Oral Daily  . rivastigmine  13.3 mg Transdermal Daily   Continuous Infusions: . cefTRIAXone (ROCEPHIN)  IV Stopped (11/20/16 2242)  . dextrose 75 mL/hr at 11/21/16 0127  . vancomycin Stopped (11/20/16 0059)    Pamella Pert, MD, PhD [facility] Pager [phone]  If 7PM-7AM, please contact night-coverage [email or web address] Password [email or web address] 11/21/2016, 12:12 PM

## 2016-11-21 NOTE — NC FL2 (Signed)
[place] MEDICAID FL2 LEVEL OF CARE SCREENING TOOL     IDENTIFICATION  Patient Name: Sylvia Medina Birthdate: 11-12-1935 Sex: female Admission Date (Current Location): 11/17/2016  [facility] and [place] Number:  Producer, television/film/video and Address:  [facility], [address] [place], [place] [address]      Provider Number: [id number]  Attending Physician Name and Address:  Leatha Gilding, MD  Relative Name and Phone Number:  Pansy, Ostrovsky [phone]    Current Level of Care: Hospital Recommended Level of Care: Skilled Nursing Facility Prior Approval Number:    Date Approved/Denied:   PASRR Number:    Discharge Plan: SNF    Current Diagnoses: Patient Active Problem List   Diagnosis Date Noted  . Osteomyelitis (HCC) 11/17/2016  . Lower extremity ulceration (HCC) 11/17/2016  . Hyperthyroidism 11/13/2016  . Atherosclerosis of native arteries of extremities with gangrene, left leg (HCC) 10/27/2016  . Atherosclerosis of artery of extremity with ulceration (HCC) 10/27/2016  . CKD (chronic kidney disease) stage 4, GFR 15-29 ml/min (HCC) 06/15/2016  . AKI (acute kidney injury) (HCC) 06/06/2016  . Acute lower UTI 06/06/2016  . Acute cystitis with hematuria   . Hypernatremia 08/31/2015  . Chronic constipation 07/27/2015  . Protein-calorie malnutrition (HCC) 07/27/2015  . Alzheimer's disease 09/18/2014  . Hyperlipidemia 09/18/2014  . Anemia of chronic disease 09/18/2014  . Essential hypertension, benign 10/07/2013  . Thyroiditis, subacute 04/14/2013  . Dementia without behavioral disturbance 02/10/2013  . Vitamin D deficiency 07/04/2012  . Depression 07/04/2012  . Diabetes mellitus type 2, diet-controlled (HCC) 07/04/2012  . CVA (cerebral vascular accident) (HCC) 07/04/2012  . Cardiac arrhythmia 07/04/2012    Orientation RESPIRATION BLADDER Height & Weight     Situation, Place, Self  Normal Incontinent, External catheter Weight: 256 lb  1.6 oz (116.2 kg) Height:   (167.6 cm)  BEHAVIORAL SYMPTOMS/MOOD NEUROLOGICAL BOWEL NUTRITION STATUS      Incontinent Diet (carb modified)  AMBULATORY STATUS COMMUNICATION OF NEEDS Skin   Extensive Assist Verbally                         Personal Care Assistance Level of Assistance  Bathing, Dressing Bathing Assistance: Maximum assistance   Dressing Assistance: Maximum assistance     Functional Limitations Info             SPECIAL CARE FACTORS FREQUENCY                       Contractures      Additional Factors Info  Code Status, Allergies, Isolation Precautions Code Status Info: FULL Allergies Info: NKA     Isolation Precautions Info: VRE     Current Medications (11/21/2016):  This is the current hospital active medication list Current Facility-Administered Medications  Medication Dose Route Frequency Provider Last Rate Last Dose  . acetaminophen (TYLENOL) tablet 650 mg  650 mg Oral Q6H PRN Jonah Blue, MD   650 mg at 11/18/16 0945   Or  . acetaminophen (TYLENOL) suppository 650 mg  650 mg Rectal Q6H PRN Jonah Blue, MD      . aspirin EC tablet 81 mg  81 mg Oral Daily Jonah Blue, MD   81 mg at 11/21/16 1034  . atorvastatin (LIPITOR) tablet 10 mg  10 mg Oral Daily Jonah Blue, MD   10 mg at 11/21/16 1035  . cefTRIAXone (ROCEPHIN) 2 g in dextrose 5 % 50 mL IVPB  2 g Intravenous Q24H Jonah Blue, MD   Stopped at 11/20/16 2242   And  . metroNIDAZOLE (FLAGYL) tablet 500 mg  500 mg Oral Bobette Mo, MD   500 mg at 11/21/16 0527  . clopidogrel (PLAVIX) tablet 75 mg  75 mg Oral Q breakfast Jonah Blue, MD   75 mg at 11/21/16 1034  . dextrose 5 % solution   Intravenous Continuous Jonah Blue, MD 75 mL/hr at 11/21/16 0127    . docusate sodium (COLACE) capsule 100 mg  100 mg Oral BID Jonah Blue, MD   100 mg at 11/21/16 1035  . enoxaparin (LOVENOX) injection 30 mg  30 mg Subcutaneous Q24H Jonah Blue, MD   30 mg at  11/21/16 0526  . feeding supplement (PRO-STAT SUGAR FREE 64) liquid 30 mL  30 mL Oral BID Leatha Gilding, MD   30 mL at 11/21/16 1034  . ferrous sulfate tablet 325 mg  325 mg Oral Q breakfast Jonah Blue, MD   325 mg at 11/21/16 1035  . metoprolol tartrate (LOPRESSOR) tablet 50 mg  50 mg Oral BID Jonah Blue, MD   50 mg at 11/20/16 1008  . ondansetron (ZOFRAN) tablet 4 mg  4 mg Oral Q6H PRN Jonah Blue, MD       Or  . ondansetron [facility]) injection 4 mg  4 mg Intravenous Q6H PRN Jonah Blue, MD      . polyethylene glycol (MIRALAX / GLYCOLAX) packet 17 g  17 g Oral Daily Bertram Millard, RPH   17 g at 11/21/16 1035  . rivastigmine (EXELON) 13.3 MG/24HR 13.3 mg  13.3 mg Transdermal Daily Jonah Blue, MD   13.3 mg at 11/20/16 1005  . vancomycin (VANCOCIN) 1,500 mg in sodium chloride 0.9 % 500 mL IVPB  1,500 mg Intravenous Q48H Lorenza Evangelist, [facility]   Stopped at 11/20/16 1610     Discharge Medications: Please see discharge summary for a list of discharge medications.  Relevant Imaging Results:  Relevant Lab Results:   Additional Information SSN: [id number]  Burna Sis, LCSW

## 2016-11-22 MED ORDER — OXYCODONE HCL 5 MG PO TABS
5.0000 mg | ORAL_TABLET | ORAL | Status: AC | PRN
  Administered 2016-11-22 – 2016-11-27 (×3): 5 mg via ORAL
  Filled 2016-11-22 (×4): qty 1

## 2016-11-22 NOTE — Plan of Care (Signed)
Problem: Bowel/Gastric: Goal: Will not experience complications related to bowel motility Outcome: Not Progressing Patient experiencing loose stools today.  Dr Lafe Garin aware of holding po bowel stimulants.

## 2016-11-22 NOTE — Progress Notes (Addendum)
PROGRESS NOTE   Sylvia Medina:[id number] DOB: 03-11-1935 DOA: 11/17/2016 PCP: System, Pcp Not In   LOS: 5 days   Brief Narrative / Interim history: 81 y.o. female with medical history significant of PVD with recent stent pplacement in right superficial femoral artery and right popliteal artery;  hyperthyroidism; DM; dementia; remote CVA; and CKD presenting after being sent from her SNF with elevated sodium and an infected left leg.  She was given a shot today and had a wound culture.  They just noticed the infection today, "they said it wasn't there yesterday."  She has had a number of ulcers - R second toe ulcer since last year, treated off and on; wound on the back of her left leg, uncertain duration; left leg wound, her daughter never noticed it, so maybe present for a couple of weeks.  No fevers.  She has been hoarse at times, had an appointment last week with endocrinology for hyperthyroidism and was supposed to start medication for this.  Shortly after admission patient became hypothermic requiring stepdown transfer.  Started on broad-spectrum IV antibiotics for her lower extremity ulcers.  Orthopedic surgery was consulted and have recommended amputation, however family is deferring this for now.  Vascular surgery was consulted over the phone and stated that they have nothing else to offer at this point  Assessment & Plan: Principal Problem:   Lower extremity ulceration (HCC) Active Problems:   Diabetes mellitus type 2, diet-controlled (HCC)   Dementia without behavioral disturbance   Essential hypertension, benign   Anemia of chronic disease   Hypernatremia   CKD (chronic kidney disease) stage 4, GFR 15-29 ml/min (HCC)   Hyperthyroidism   Bilateral lower extremity ulcers -Currently appear infected with foul-smelling drainage patient was placed on antibiotics with vancomycin, ceftriaxone and metronidazole in the emergency room.  -I have consulted orthopedic surgery, discussed  with Dr. Lajoyce Corners, he recommends amputation however even following that the amputation wound itself may not heal. -Nothing to add more from a vascular surgery standpoint, I discussed with Dr. Imogene Burn over the phone on 10/8 -ABIs 0.58 on the right and 0.45 on the left -Long discussion at bedside on 10/7 with the patient's daughter, she does not know how to proceed forward and will discuss with her brother -Discussed again over the phone with the daughter on 10/9, she has discussed with her brother and decided against amputation and would like conservative management for now.  -needs a PICC line and IV antibiotics for 4-6 weeks at [facility].  Discussed with ID and we can probably do ceftriaxone and metronidazole. She is MRSA PCR negative.    Hypernatremia -Improved with fluids  Chronic kidney disease stage IV -Has been in stage IV throughout the 2018 year, creatinine in the 1.8-1.9 range at the beginning of 2018 but has increased to 2.6 range since last couple of months, creatinine on admission is 2.28, improved from 2.5 last month -Avoid nephrotoxic agents, slightly improvement to 1.9 on 10/6, but increasing slightly since, discontinued vancomycin on 10/8, continue ceftriaxone and metronidazole alone  Hypothermia -Patient had an episode of hypothermia on 10/5 and rapid response was called, however on physical exam she was warm to touch, and a repeat oral temperature was 97.4.  She seemed to be having another hypothermia episode overnight, and she was transferred to stepdown.   -TSH, cortisol normal -Improved  Peripheral vascular disease -With bilateral lower extremity ulcers, as above, continue aspirin and Plavix, family discussions are ongoing about best way to move  forward as they are worried about the amputation  Hypertension -continue metoprolol  Dementia -Continue home medications  Hyperlipidemia -Continue atorvastatin  Hyperthyroidism -TSH was 0.02 several months ago, however I do not see an  outpatient  free T3 or free T4 -TSH here normal, free T3 and T4 normal.  She has not started methimazole as an outpatient, and there is currently no role for methimazole. Recheck TFTs as an outpatient in several weeks.   DVT prophylaxis: lovenox Code Status: Full code Family Communication: No family at bedside, d/w daughter over the phone Disposition Plan: TBD  Consultants:   Orthopedic surgery  Procedures:   ABI  Antimicrobials:  Vancomycin 10/4 >> 10/8  Ceftriaxone 10/4 >>  Metronidazole 10/4 >>  Subjective: -Pleasantly demented, no complaints.  Denies any pain  Objective: Vitals:   11/22/16 0600 11/22/16 0649 11/22/16 1040 11/22/16 1128  BP: (!) 97/46  (!) 124/47 (!) 114/36  Pulse: (!) 59 64 (!) 5 71  Resp: Temp:      TempSrc:      SpO2: 100% 100%  100%  Weight:      Height:        Intake/Output Summary (Last 24 hours) at 11/22/16 1209 Last data filed at 11/22/16 0600  Gross per 24 hour  Intake             2165 ml  Output              450 ml  Net             1715 ml   Filed Weights   11/17/16 2343 11/19/16 0549  Weight: 114.3 kg (252 lb) 116.2 kg (256 lb 1.6 oz)    Examination:  Constitutional: No distress, calm Eyes: No scleral icterus Respiratory: Clear to auscultation on anterior fields, no wheezing, no crackles Cardiovascular: Regular rate and rhythm, no murmurs.  No edema Abdomen: Soft, nontender Skin: Left lateral heel wound with malodorous drainage, left lateral calf probing to bone, right lateral heel Neurologic: Nonfocal   Data Reviewed: I have independently reviewed following labs and imaging studies   CBC:  Recent Labs Lab 11/17/16 1850 11/18/16 1415 11/19/16 0428 11/20/16 0412 11/21/16 0747  WBC 12.0* 13.5* 10.9* 13.5* 14.7*  NEUTROABS 8.3*  --   --   --   --   HGB 9.5* 8.4* 8.5* 8.2* 8.3*  HCT 30.7* 27.5* 27.6* 26.4* 26.7*  MCV 83.7 83.6 82.1 81.2 79.7  PLT 215 188 196 277 289   Basic Metabolic  Panel:  Recent Labs Lab 11/17/16 1850 11/18/16 1415 11/19/16 0428 11/20/16 0412 11/21/16 0747  NA 151* 143 140 137 133*  K 4.2 3.8 3.2* 3.6 3.3*  CL 119* 118* 110 109 106  CO2 23 18* 20* 18* 18*  GLUCOSE 101* 136* 100* 106* 104*  BUN 50* 41* 40* 41* 45*  CREATININE 2.28* 2.12* 1.93* 2.18* 2.29*  CALCIUM 9.0 8.4* 8.3* 8.2* 8.4*   GFR: Estimated Creatinine Clearance: 25.8 mL/min (A) (by C-G formula based on SCr of 2.29 mg/dL (H)). Liver Function Tests:  Recent Labs Lab 11/18/16 1415 11/19/16 0428  AST 45* 123*  ALT 28 51  ALKPHOS 163* 222*  BILITOT 0.8 0.6  PROT 5.9* 5.6*  ALBUMIN 1.8* 1.7*   No results for input(s): LIPASE, AMYLASE in the last 168 hours. No results for input(s): AMMONIA in the last 168 hours. Coagulation Profile: No results for input(s): INR, PROTIME in the last 168 hours. Cardiac Enzymes: No results  for input(s): CKTOTAL, CKMB, CKMBINDEX, TROPONINI in the last 168 hours. BNP (last 3 results) No results for input(s): PROBNP in the last 8760 hours. HbA1C: No results for input(s): HGBA1C in the last 72 hours. CBG:  Recent Labs Lab 11/18/16 1209 11/19/16 0352  GLUCAP 129* 94   Lipid Profile: No results for input(s): CHOL, HDL, LDLCALC, TRIG, CHOLHDL, LDLDIRECT in the last 72 hours. Thyroid Function Tests: No results for input(s): TSH, T4TOTAL, FREET4, T3FREE, THYROIDAB in the last 72 hours. Anemia Panel: No results for input(s): VITAMINB12, FOLATE, FERRITIN, TIBC, IRON, RETICCTPCT in the last 72 hours. Urine analysis:    Component Value Date/Time   COLORURINE YELLOW 11/17/2016 1808   APPEARANCEUR HAZY (A) 11/17/2016 1808   LABSPEC 1.012 11/17/2016 1808   PHURINE 6.0 11/17/2016 1808   GLUCOSEU NEGATIVE 11/17/2016 1808   HGBUR MODERATE (A) 11/17/2016 1808   BILIRUBINUR NEGATIVE 11/17/2016 1808   KETONESUR NEGATIVE 11/17/2016 1808   PROTEINUR 30 (A) 11/17/2016 1808   NITRITE POSITIVE (A) 11/17/2016 1808   LEUKOCYTESUR LARGE (A)  11/17/2016 1808   Sepsis Labs: Invalid input(s): PROCALCITONIN, LACTICIDVEN  Recent Results (from the past 240 hour(s))  Blood Cultures x 2 sites     Status: None (Preliminary result)   Collection Time: 11/17/16 10:32 PM  Result Value Ref Range Status   Specimen Description BLOOD RIGHT FOREARM  Final   Special Requests   Final    BOTTLES DRAWN AEROBIC AND ANAEROBIC Blood Culture adequate volume   Culture   Final    NO GROWTH 3 DAYS Performed at [facility], [address], [place], [place] [address]    Report Status PENDING  Incomplete  Blood Cultures x 2 sites     Status: None (Preliminary result)   Collection Time: 11/17/16 10:37 PM  Result Value Ref Range Status   Specimen Description BLOOD LEFT ANTECUBITAL  Final   Special Requests   Final    BOTTLES DRAWN AEROBIC AND ANAEROBIC Blood Culture adequate volume   Culture   Final    NO GROWTH 3 DAYS Performed at [facility], [address], [place], [place] [address]    Report Status PENDING  Incomplete  MRSA PCR Screening     Status: None   Collection Time: 11/19/16  6:02 AM  Result Value Ref Range Status   MRSA by PCR NEGATIVE NEGATIVE Final    Comment:        The GeneXpert MRSA Assay (FDA approved for NASAL specimens only), is one component of a comprehensive MRSA colonization surveillance program. It is not intended to diagnose MRSA infection nor to guide or monitor treatment for MRSA infections.       Radiology Studies: No results found.   Scheduled Meds: . aspirin EC  81 mg Oral Daily  . atorvastatin  10 mg Oral Daily  . clopidogrel  75 mg Oral Q breakfast  . docusate sodium  100 mg Oral BID  . enoxaparin (LOVENOX) injection  30 mg Subcutaneous Q24H  . feeding supplement (PRO-STAT SUGAR FREE 64)  30 mL Oral BID  . ferrous sulfate  325 mg Oral Q breakfast  . metoprolol tartrate  50 mg Oral BID  . metroNIDAZOLE  500 mg Oral Q8H  . polyethylene glycol  17 g Oral Daily  . rivastigmine  13.3  mg Transdermal Daily   Continuous Infusions: . cefTRIAXone (ROCEPHIN)  IV Stopped (11/21/16 2233)  . dextrose 75 mL/hr at 11/22/16 0246    Pamella Pert, MD, PhD  [facility] Pager [phone]  If 7PM-7AM, please contact night-coverage [email or web address] Password [email or web address] 11/22/2016, 12:09 PM

## 2016-11-22 NOTE — Progress Notes (Signed)
Text message sent to Dr Elvera Lennox with Daughter's number at [phone] with regards of POC.

## 2016-11-22 NOTE — Plan of Care (Signed)
Problem: Education: Goal: Knowledge of [facility] Education information/materials will improve Outcome: Progressing Daughter states she does not want BKA for the patient. Patient states she does not want surgery for her foot( question orientation).

## 2016-11-22 NOTE — Plan of Care (Signed)
Problem: Health Behavior/Discharge Planning: Goal: Ability to manage health-related needs will improve Outcome: Not Progressing Daughter vertebralis to Dr Lafe Garin that she does not wish for patient to have BKA to infected feet.

## 2016-11-22 NOTE — Care Management Note (Addendum)
Case Management Note  Patient Details  Name: Sylvia Medina MRN: [id number] Date of Birth: Jan 18, 1936  Subjective/Objective:    [facility], bil le ulcers, ckd stage 4, hypothermia, dementia, conts on rocephin and falgyl, may need for 6 weeks orhto consulted and recs amputation.  Plan is to return to SNF.  CSW following.   10/10 1935 [name] RN, BSN- family has declined amputation , per ID an alternative would be for rocephin/flagyl for 4-6 weeks if she is stable, but unfortunately  she was hypotensive and hypothermic again last pm, Palliative consulted.                 Action/Plan: NCM will follow along with CSW for dc needs.  Expected Discharge Date:                  Expected Discharge Plan:  Skilled Nursing Facility  In-House Referral:  Clinical Social Work  Discharge planning Services  CM Consult  Post Acute Care Choice:    Choice offered to:     DME Arranged:    DME Agency:     HH Arranged:    HH Agency:     Status of Service:  Completed, signed off  If discussed at [employer], dates discussed:    Additional Comments:  Leone Haven, RN 11/22/2016, 1:04 PM

## 2016-11-23 LAB — BASIC METABOLIC PANEL
ANION GAP: 12 (ref 5–15)
BUN: 43 mg/dL — ABNORMAL HIGH (ref 6–20)
CHLORIDE: 106 mmol/L (ref 101–111)
CO2: 17 mmol/L — AB (ref 22–32)
CREATININE: 2.2 mg/dL — AB (ref 0.44–1.00)
Calcium: 8.5 mg/dL — ABNORMAL LOW (ref 8.9–10.3)
GFR calc non Af Amer: 20 mL/min — ABNORMAL LOW (ref >60)
GFR, EST AFRICAN AMERICAN: 23 mL/min — AB (ref >60)
Glucose, Bld: 89 mg/dL (ref 65–99)
POTASSIUM: 3.5 mmol/L (ref 3.5–5.1)
SODIUM: 135 mmol/L (ref 135–145)

## 2016-11-23 LAB — CULTURE, BLOOD (ROUTINE X 2)
CULTURE: NO GROWTH
Culture: NO GROWTH
SPECIAL REQUESTS: ADEQUATE
Special Requests: ADEQUATE

## 2016-11-23 LAB — CBC
HCT: 24.6 % — ABNORMAL LOW (ref 36.0–46.0)
HEMOGLOBIN: 8.4 g/dL — AB (ref 12.0–15.0)
MCH: 26.3 pg (ref 26.0–34.0)
MCHC: 34.1 g/dL (ref 30.0–36.0)
MCV: 77.1 fL — AB (ref 78.0–100.0)
PLATELETS: 330 10*3/uL (ref 150–400)
RBC: 3.19 MIL/uL — AB (ref 3.87–5.11)
RDW: 18.3 % — ABNORMAL HIGH (ref 11.5–15.5)
WBC: 14.7 10*3/uL — AB (ref 4.0–10.5)

## 2016-11-23 MED ORDER — METOPROLOL TARTRATE 25 MG PO TABS
25.0000 mg | ORAL_TABLET | Freq: Two times a day (BID) | ORAL | Status: DC
  Administered 2016-11-23 – 2016-11-24 (×3): 25 mg via ORAL
  Filled 2016-11-23 (×3): qty 1

## 2016-11-23 NOTE — Consult Note (Addendum)
WOC consult requested for buttocks and leg wounds.  Consult was already performed on 10/5 for leg wounds; refer to previous progress notes for assessment and measurements, and topical treatment ordered have been provided for bedside nurses to perform.  Ortho service also performed a consult and recommended amputation, which the patient declined.  Vascular team consulted and has nothing else to offer, according to progress notes.  Consult requested for buttocks.  Bedside nurse states patient has a "stab-like full thickness wound" of unknown origin to this location which is already being treated with a foam dressing to protect and promote healing. Nurse states this is NOT a pressure injury. Pt is on a low airloss bed to reduce pressure.   Please re-consult if further assistance is needed.  Thank-you,  Cammie Mcgee MSN, RN, CWOCN, [place], CNS [phone]

## 2016-11-23 NOTE — Progress Notes (Signed)
Palliative:  We have reached out to Sylvia Medina's daughter Sylvia Medina with permission from Sylvia Medina to set up GOC conversation. I have left a voicemail with Sylvia Medina requesting meeting time and offered anytime 8a-4p in person or via telephone. Sylvia Medina has some understanding of her situation but tells [place] she has cancer in her bone that is causing her problems. Thank you for this consult.   No charge  Yong Channel, NP Palliative Medicine Team Pager # [phone] (M-F 8a-5p) Team Phone # [phone] (Nights/Weekends)

## 2016-11-23 NOTE — Clinical Social Work Note (Signed)
Clinical Social Work Assessment  Patient Details  Name: Sylvia Medina MRN: [id number] Date of Birth: 08-21-1935  Date of referral:  11/23/16               Reason for consult:  Facility Placement                Permission sought to share information with:  Family Supports Permission granted to share information::  No  Name::     Sylvia Medina  Agency::  [facility]  Relationship::  dtr  Contact Information:     Housing/Transportation Living arrangements for the past 2 months:  Skilled Building surveyor of Information:  Adult Children Patient Interpreter Needed:  None Criminal Activity/Legal Involvement Pertinent to Current Situation/Hospitalization:  No - Comment as needed Significant Relationships:  Adult Children Lives with:  Facility Resident Do you feel safe going back to the place where you live?  Yes Need for family participation in patient care:  Yes (Comment) (decision making)  Care giving concerns:  Pt lives at [facility]- no concerns with care at this time.   Social Worker assessment / plan:  CSW spoke with pt dtr concerning plan for time of DC.  Dtr confirms pt is LTC at [facility].  Employment status:  Retired Database administrator PT Recommendations:  Skilled Nursing Facility Information / Referral to community resources:  Skilled Nursing Facility  Patient/Family's Response to care:  Dtr would like transfer back to [facility] when stable to leave the hospital.  Patient/Family's Understanding of and Emotional Response to Diagnosis, Current Treatment, and Prognosis:  No questions or concerns at this time- unclear level of understanding- dtr is not willing to have pt leg amputated even though this is contributing to pt current condition and MD does not think she can make a recovery without amputation.  Emotional Assessment Appearance:  Appears stated age Attitude/Demeanor/Rapport:  Unable to Assess Affect (typically observed):  Unable to  Assess Orientation:  Fluctuating Orientation (Suspected and/or reported Sundowners) Alcohol / Substance use:  Not Applicable Psych involvement (Current and /or in the community):  No (Comment)  Discharge Needs  Concerns to be addressed:  Care Coordination Readmission within the last 30 days:  Yes Current discharge risk:  Physical Impairment Barriers to Discharge:  Continued Medical Work up   [employer], LCSW 11/23/2016, 2:12 PM

## 2016-11-23 NOTE — Progress Notes (Signed)
PROGRESS NOTE    Sylvia Medina  ZOX:[id number] DOB: 31-Dec-1935 DOA: 11/17/2016 PCP: System, Pcp Not In       Brief Narrative:  81 y.o.femalewith mild dementia, PVD with recent stent pplacement in right superficial femoral artery and right popliteal artery; hyperthyroidism; DM; remote CVA; and CKD presenting after being sent from her SNF with elevated sodium and an infected left leg.    Shortly after admission patient became hypothermic requiring stepdown transfer.  Started on broad-spectrum IV antibiotics for her lower extremity ulcers.  Orthopedic surgery was consulted and have recommended amputation, however family is deferring this for now.  Vascular surgery was consulted over the phone and stated that they have nothing else to offer at this point.  Was discussed with ID who said 4-6 weeks ceftriaxone/Flagyl would be reasonable alternative, if stable.     Assessment & Plan:  Principal Problem:   Lower extremity ulceration (HCC) Active Problems:   Diabetes mellitus type 2, diet-controlled (HCC)   Dementia without behavioral disturbance   Essential hypertension, benign   Anemia of chronic disease   Hypernatremia   CKD (chronic kidney disease) stage 4, GFR 15-29 ml/min (HCC)   Hyperthyroidism   Bilateral lower extremity ulcers In the setting of diabetes and moderate-to-severe PVD bilaterally.  Dr. Lajoyce Corners has offered left trans-tib amputation, but family have deferred.  This would be high risk to dehisce.  Vascular surgery have nothing to offer.  Patient has dementia and is non-ambulatory.   -Continue ceftriaxone and Flagyl -Per ID by phone, would be reasonable to place PICC and continue ceftriaxone/Flagyl for 4-6 weeks at SNF; unfortunately, she was hypotensive and hypothermic again last night, so it is not clear that she is stable for SNF discharge -Consult Palliative Care for assistance with GOC -Oxycodone PRN for pain, Acetaminophen PRN -May have once daily PRN fentanyl 25  mcg for dressing changes    Hypernatremia Normalized   Chronic kidney disease stage IV Baseline Cr 1.9, now 2.2, stable during admission.   Hypothermia Rectal temp 35.1C this AM.  Back on warming blanket.  TSH and cortisol normal.  Presumed from infected leg ulcers.   Peripheral vascular disease Not intervention candidate -Continue aspirin, Plavix, statin   Hypertension Hypotensive again overnight. -Hold HCTZ -Continue metoprolol, will lower dose to 12.5 BID   Dementia -Continue Rivastigmine   Hyperlipidemia -Continue statin   Hyperthyroidism TSH normal. Has outpatient follow up.  -Hold Tapazole  Pressure ulcer -Consult [facility]OC       DVT prophylaxis: Lovenox Code Status: FULL Family Communication: None present Disposition Plan: Monitor BP overnight.  Continues to be hypotensive and hypothermic today, requiring continued stepdown care.  Will discuss with palliative care.   Consultants:   Vascular Surgery, Orthopedics, Palliative care  Procedures:   None  Antimicrobials:   Vancomycin 10/4 >> 10/8  Ceftriaxone 10/4 >>  Metronidazole 10/4 >>    Subjective: Feels "terrible", generalized malaise, foot pain, weak.  Hypothermic rectally this AM.  Hypotensive.    Objective: Vitals:   11/23/16 0400 11/23/16 0450 11/23/16 0455 11/23/16 0808  BP: (!) 127/48   (!) 98/38  Pulse:    (!) 56  Resp: 20   14  Temp:  (!) 95.9 F (35.5 C) (!) 95.8 F (35.4 C)   TempSrc:  Oral Rectal   SpO2: 100%     Weight:      Height:        Intake/Output Summary (Last 24 hours) at 11/23/16 0856 Last data filed at 11/23/16 0600  Gross per 24 hour  Intake           2154.5 ml  Output             1700 ml  Net            454.5 ml   Filed Weights   11/17/16 2343 11/19/16 0549  Weight: 114.3 kg (252 lb) 116.2 kg (256 lb 1.6 oz)    Examination:  General exam: Oriented to "hospital", "I think it's [facility]".  Not oriented to year.  Appears calm, pain with moveent of foot.    Respiratory system: Clear to auscultation. Respiratory effort normal. Cardiovascular system: S1 & S2 heard, RRR. No JVD, murmurs, rubs, gallops or clicks. No pitting pedal edema. Gastrointestinal system: Abdomen is massively obese, nondistended, soft and nontender. Central nervous system: Awake.  Symmetric generalized weakness and deconditioning.  Cranial nerves grossly normal Skin: There is mild redness around the left heel.  The left leg is overall not that red.  Psychiatry: Judgement and insight appear poor, due to dementia. Mood & affect blunted.     Data Reviewed: I have personally reviewed following labs and imaging studies  CBC:  Recent Labs Lab 11/17/16 1850 11/18/16 1415 11/19/16 0428 11/20/16 0412 11/21/16 0747 11/23/16 0514  WBC 12.0* 13.5* 10.9* 13.5* 14.7* 14.7*  NEUTROABS 8.3*  --   --   --   --   --   HGB 9.5* 8.4* 8.5* 8.2* 8.3* 8.4*  HCT 30.7* 27.5* 27.6* 26.4* 26.7* 24.6*  MCV 83.7 83.6 82.1 81.2 79.7 77.1*  PLT 215 188 196 277 289 330   Basic Metabolic Panel:  Recent Labs Lab 11/18/16 1415 11/19/16 0428 11/20/16 0412 11/21/16 0747 11/23/16 0514  NA 143 140 137 133* 135  K 3.8 3.2* 3.6 3.3* 3.5  CL 118* 110 109 106 106  CO2 18* 20* 18* 18* 17*  GLUCOSE 136* 100* 106* 104* 89  BUN 41* 40* 41* 45* 43*  CREATININE 2.12* 1.93* 2.18* 2.29* 2.20*  CALCIUM 8.4* 8.3* 8.2* 8.4* 8.5*   GFR: Estimated Creatinine Clearance: 26.9 mL/min (A) (by C-G formula based on SCr of 2.2 mg/dL (H)). Liver Function Tests:  Recent Labs Lab 11/18/16 1415 11/19/16 0428  AST 45* 123*  ALT 28 51  ALKPHOS 163* 222*  BILITOT 0.8 0.6  PROT 5.9* 5.6*  ALBUMIN 1.8* 1.7*   No results for input(s): LIPASE, AMYLASE in the last 168 hours. No results for input(s): AMMONIA in the last 168 hours. Coagulation Profile: No results for input(s): INR, PROTIME in the last 168 hours. Cardiac Enzymes: No results for input(s): CKTOTAL, CKMB, CKMBINDEX, TROPONINI in the last 168  hours. BNP (last 3 results) No results for input(s): PROBNP in the last 8760 hours. HbA1C: No results for input(s): HGBA1C in the last 72 hours. CBG:  Recent Labs Lab 11/18/16 1209 11/19/16 0352  GLUCAP 129* 94   Lipid Profile: No results for input(s): CHOL, HDL, LDLCALC, TRIG, CHOLHDL, LDLDIRECT in the last 72 hours. Thyroid Function Tests: No results for input(s): TSH, T4TOTAL, FREET4, T3FREE, THYROIDAB in the last 72 hours. Anemia Panel: No results for input(s): VITAMINB12, FOLATE, FERRITIN, TIBC, IRON, RETICCTPCT in the last 72 hours. Urine analysis:    Component Value Date/Time   COLORURINE YELLOW 11/17/2016 1808   APPEARANCEUR HAZY (A) 11/17/2016 1808   LABSPEC 1.012 11/17/2016 1808   PHURINE 6.0 11/17/2016 1808   GLUCOSEU NEGATIVE 11/17/2016 1808   HGBUR MODERATE (A) 11/17/2016 1808   BILIRUBINUR NEGATIVE 11/17/2016 1808  KETONESUR NEGATIVE 11/17/2016 1808   PROTEINUR 30 (A) 11/17/2016 1808   NITRITE POSITIVE (A) 11/17/2016 1808   LEUKOCYTESUR LARGE (A) 11/17/2016 1808   Sepsis Labs: (procalcitonin:4,lacticidven:4)  ) Recent Results (from the past 240 hour(s))  Blood Cultures x 2 sites     Status: None (Preliminary result)   Collection Time: 11/17/16 10:32 PM  Result Value Ref Range Status   Specimen Description BLOOD RIGHT FOREARM  Final   Special Requests   Final    BOTTLES DRAWN AEROBIC AND ANAEROBIC Blood Culture adequate volume   Culture   Final    NO GROWTH 4 DAYS Performed at [facility], [address], [place], [place] [address]    Report Status PENDING  Incomplete  Blood Cultures x 2 sites     Status: None (Preliminary result)   Collection Time: 11/17/16 10:37 PM  Result Value Ref Range Status   Specimen Description BLOOD LEFT ANTECUBITAL  Final   Special Requests   Final    BOTTLES DRAWN AEROBIC AND ANAEROBIC Blood Culture adequate volume   Culture   Final    NO GROWTH 4 DAYS Performed at [facility], [address] [place], [place] [address]    Report Status PENDING  Incomplete  MRSA PCR Screening     Status: None   Collection Time: 11/19/16  6:02 AM  Result Value Ref Range Status   MRSA by PCR NEGATIVE NEGATIVE Final    Comment:        The GeneXpert MRSA Assay (FDA approved for NASAL specimens only), is one component of a comprehensive MRSA colonization surveillance program. It is not intended to diagnose MRSA infection nor to guide or monitor treatment for MRSA infections.          Radiology Studies: No results found.      Scheduled Meds: . aspirin EC  81 mg Oral Daily  . atorvastatin  10 mg Oral Daily  . clopidogrel  75 mg Oral Q breakfast  . docusate sodium  100 mg Oral BID  . enoxaparin (LOVENOX) injection  30 mg Subcutaneous Q24H  . feeding supplement (PRO-STAT SUGAR FREE 64)  30 mL Oral BID  . ferrous sulfate  325 mg Oral Q breakfast  . metoprolol tartrate  50 mg Oral BID  . metroNIDAZOLE  500 mg Oral Q8H  . polyethylene glycol  17 g Oral Daily  . rivastigmine  13.3 mg Transdermal Daily   Continuous Infusions: . cefTRIAXone (ROCEPHIN)  IV Stopped (11/22/16 2318)  . dextrose 75 mL/hr at 11/22/16 1811     LOS: 6 days    Time spent: 30    Alberteen Sam, MD [facility] Pager [phone]  If 7PM-7AM, please contact night-coverage [email or web address] Password [email or web address] 11/23/2016, 8:56 AM

## 2016-11-24 LAB — CREATININE, SERUM
Creatinine, Ser: 2.12 mg/dL — ABNORMAL HIGH (ref 0.44–1.00)
GFR calc Af Amer: 24 mL/min — ABNORMAL LOW (ref >60)
GFR calc non Af Amer: 21 mL/min — ABNORMAL LOW (ref >60)

## 2016-11-24 LAB — CBC
HEMATOCRIT: 23 % — AB (ref 36.0–46.0)
HEMOGLOBIN: 7.4 g/dL — AB (ref 12.0–15.0)
MCH: 25.2 pg — AB (ref 26.0–34.0)
MCHC: 32.2 g/dL (ref 30.0–36.0)
MCV: 78.2 fL (ref 78.0–100.0)
Platelets: 411 10*3/uL — ABNORMAL HIGH (ref 150–400)
RBC: 2.94 MIL/uL — ABNORMAL LOW (ref 3.87–5.11)
RDW: 18.6 % — AB (ref 11.5–15.5)
WBC: 8.9 10*3/uL (ref 4.0–10.5)

## 2016-11-24 LAB — BASIC METABOLIC PANEL
ANION GAP: 5 (ref 5–15)
BUN: 38 mg/dL — ABNORMAL HIGH (ref 6–20)
CO2: 20 mmol/L — ABNORMAL LOW (ref 22–32)
Calcium: 8.4 mg/dL — ABNORMAL LOW (ref 8.9–10.3)
Chloride: 112 mmol/L — ABNORMAL HIGH (ref 101–111)
Creatinine, Ser: 2.1 mg/dL — ABNORMAL HIGH (ref 0.44–1.00)
GFR calc Af Amer: 25 mL/min — ABNORMAL LOW (ref >60)
GFR calc non Af Amer: 21 mL/min — ABNORMAL LOW (ref >60)
Glucose, Bld: 94 mg/dL (ref 65–99)
POTASSIUM: 3 mmol/L — AB (ref 3.5–5.1)
SODIUM: 137 mmol/L (ref 135–145)

## 2016-11-24 MED ORDER — HYDROCORTISONE NA SUCCINATE PF 100 MG IJ SOLR
50.0000 mg | Freq: Two times a day (BID) | INTRAMUSCULAR | Status: DC
  Administered 2016-11-24 – 2016-11-28 (×9): 50 mg via INTRAVENOUS
  Filled 2016-11-24 (×9): qty 2

## 2016-11-24 MED ORDER — GLUCERNA SHAKE PO LIQD
237.0000 mL | Freq: Two times a day (BID) | ORAL | Status: DC
  Administered 2016-11-24 – 2016-12-05 (×10): 237 mL via ORAL

## 2016-11-24 MED ORDER — SODIUM CHLORIDE 0.9 % IV BOLUS (SEPSIS)
500.0000 mL | Freq: Once | INTRAVENOUS | Status: AC
  Administered 2016-11-24: 500 mL via INTRAVENOUS

## 2016-11-24 NOTE — Progress Notes (Signed)
Palliative:   I have again reached out via telephone and left another voicemail for daughter at number listed in chart without success. IF NURSING SEES FAMILY WOULD BE HELPFUL TO GET MORE PHONE CONTACTS FOR CHILDREN (I believe she has a son as well but no contact listed). Will continue to try and contact family to discuss GOC. Thank you for this consult.   No charge  Yong Channel, NP Palliative Medicine Team Pager # [phone] (M-F 8a-5p) Team Phone # [phone] (Nights/Weekends)

## 2016-11-24 NOTE — Progress Notes (Signed)
PROGRESS NOTE    Sylvia Medina  ZOX:[id number] DOB: [birth year] DOA: 11/17/2016 PCP: System, Pcp Not In      Brief Narrative:  81 y.o.femalewith mild dementia, PVD with recent stent pplacement in right superficial femoral artery and right popliteal artery; hyperthyroidism; DM; remote CVA; and CKD presenting after being sent from her SNF with elevated sodium and an infected left leg.   Shortly after admission patient became hypothermic requiring stepdown transfer. Started on broad-spectrum IV antibiotics for her lower extremity ulcers. Orthopedic surgery was consulted and have recommended amputation, however family is deferring this for now. Vascular surgery was consulted over the phone and stated that they have nothing else to offer at this point.  Was discussed with ID who said 4-6 weeks ceftriaxone/Flagyl would be reasonable alternative, if stable.  The patient however, remained hypotensive and hypothermic requiring warming blanket and IV fluids.  She was evaluated by Palliative care.  Family were not reachable.   Assessment & Plan:  Principal Problem:   Lower extremity ulceration (HCC) Active Problems:   Diabetes mellitus type 2, diet-controlled (HCC)   Dementia without behavioral disturbance   Essential hypertension, benign   Anemia of chronic disease   Hypernatremia   CKD (chronic kidney disease) stage 4, GFR 15-29 ml/min (HCC)   Hyperthyroidism  Bilateral lower extremity ulcers In the setting of diabetes and moderate-to-severe PVD bilaterally.  Dr. Lajoyce Corners has offered left trans-tib amputation, but family have deferred.  This would be high risk to dehisce.  Vascular surgery have nothing to offer.  Patient has dementia and is non-ambulatory.  The patient's wound appears black and necrotic.family were not reachable yesterday.  Per ID by phone, would be reasonable to place PICC and continue ceftriaxone/Flagyl for 4-6 weeks at SNF. -Continue ceftriaxone and Flagyl -Still not  stable for SNF discharge -Palliative care consultation is appreciated -Oxycodone when necessary for pain, acetaminophen when necessary, fentanyl when necessary for dressing changes -Trial stress dose steroids    Chronic kidney disease stage IV Baseline Cr 1.9, now 2.2, stable during admission.  Hypothermia Rectal temp 35.1C this AM.  Back on warming blanket.  TSH and cortisol normal.  Presumed from infected leg ulcers. -Trial stress dose steroids  Peripheral vascular disease Not intervention candidate -continue aspirin, Plavix, statin Hypertension Hypotensive again overnight. -hold HCTZ -Reduce metoprolol again  Dementia -continue histamine  Hyperlipidemia -continue statin  Hyperthyroidism TSH normal. Has outpatient follow up. -hold Tapazole  Buttock ulcer WOC have evaluated and believe this is not a pressure ulcer.  Hypernatremia Normalized     DVT prophylaxis: Lovenox Code Status: FULL code Family Communication: none present, attempted to reach daughter by telephone yesterday, no answer, voice mailbox Full Disposition Plan: continues to be hypotensive and hypothermic today, requiring continued to stepdown care.  Palliative Care consultation appreciated. Overall her prognosis appears poor. She is not stable SNF discharge, I feel that it is becoming obvious that her only options are amputation versus hospice care.   Consultants:   Vascular Surgery, Orthopedics, Palliative care  Procedures:   None  Antimicrobials:   Vancomycin 10/4 >> 10/8  Ceftriaxone 10/4 >>  Metronidazole 10/4 >>    Subjective: Feels tired.  Foot pain improved.  Not oriented.  Very weak.  No other complaints.  Objective: Vitals:   11/23/16 2300 11/24/16 0219 11/24/16 0300 11/24/16 0730  BP: (!) 105/45  (!) 95/54 (!) 88/18  Pulse:      Resp: (!) 25  16   Temp: 97.8 F (36.6 C) (!) 97.4  F (36.3 C)    TempSrc: Axillary Oral  Oral  SpO2: 98%  98%   Weight:        Height:        Intake/Output Summary (Last 24 hours) at 11/24/16 0841 Last data filed at 11/24/16 0600  Gross per 24 hour  Intake            837.5 ml  Output             1401 ml  Net           -563.5 ml   Filed Weights   11/17/16 2343 11/19/16 0549  Weight: 114.3 kg (252 lb) 116.2 kg (256 lb 1.6 oz)    Examination:  General exam: appears moribund. Not oriented to year, oriented to hospital only. Respiratory system: Clear to auscultation. Respiratory effort normal. Cardiovascular system: S1 & S2 heard, RRR. No JVD, murmurs, rubs, gallops or clicks. No pedal edema. Gastrointestinal system: Abdomen is nondistended, soft and nontender.  Central nervous system: rouses easily, not oriented. Globally extremely weak, unable to sit up, unable to lift her arms, not able to lift her legs. Extremities: The bilateral legs have necrotic open ulcers with surrounding gangrene. Psychiatry: Judgement and insight appear poor due to dementia. Mood & affect blunted.     Data Reviewed: I have personally reviewed following labs and imaging studies  CBC:  Recent Labs Lab 11/17/16 1850 11/18/16 1415 11/19/16 0428 11/20/16 0412 11/21/16 0747 11/23/16 0514  WBC 12.0* 13.5* 10.9* 13.5* 14.7* 14.7*  NEUTROABS 8.3*  --   --   --   --   --   HGB 9.5* 8.4* 8.5* 8.2* 8.3* 8.4*  HCT 30.7* 27.5* 27.6* 26.4* 26.7* 24.6*  MCV 83.7 83.6 82.1 81.2 79.7 77.1*  PLT 215 188 196 277 289 330   Basic Metabolic Panel:  Recent Labs Lab 11/18/16 1415 11/19/16 0428 11/20/16 0412 11/21/16 0747 11/23/16 0514 11/24/16 0710  NA 143 140 137 133* 135  --   K 3.8 3.2* 3.6 3.3* 3.5  --   CL 118* 110 109 106 106  --   CO2 18* 20* 18* 18* 17*  --   GLUCOSE 136* 100* 106* 104* 89  --   BUN 41* 40* 41* 45* 43*  --   CREATININE 2.12* 1.93* 2.18* 2.29* 2.20* 2.12*  CALCIUM 8.4* 8.3* 8.2* 8.4* 8.5*  --    GFR: Estimated Creatinine Clearance: 27.9 mL/min (A) (by C-G formula based on SCr of 2.12 mg/dL (H)). Liver  Function Tests:  Recent Labs Lab 11/18/16 1415 11/19/16 0428  AST 45* 123*  ALT 28 51  ALKPHOS 163* 222*  BILITOT 0.8 0.6  PROT 5.9* 5.6*  ALBUMIN 1.8* 1.7*   No results for input(s): LIPASE, AMYLASE in the last 168 hours. No results for input(s): AMMONIA in the last 168 hours. Coagulation Profile: No results for input(s): INR, PROTIME in the last 168 hours. Cardiac Enzymes: No results for input(s): CKTOTAL, CKMB, CKMBINDEX, TROPONINI in the last 168 hours. BNP (last 3 results) No results for input(s): PROBNP in the last 8760 hours. HbA1C: No results for input(s): HGBA1C in the last 72 hours. CBG:  Recent Labs Lab 11/18/16 1209 11/19/16 0352  GLUCAP 129* 94   Lipid Profile: No results for input(s): CHOL, HDL, LDLCALC, TRIG, CHOLHDL, LDLDIRECT in the last 72 hours. Thyroid Function Tests: No results for input(s): TSH, T4TOTAL, FREET4, T3FREE, THYROIDAB in the last 72 hours. Anemia Panel: No results for input(s): VITAMINB12, FOLATE, FERRITIN, TIBC,  IRON, RETICCTPCT in the last 72 hours. Urine analysis:    Component Value Date/Time   COLORURINE YELLOW 11/17/2016 1808   APPEARANCEUR HAZY (A) 11/17/2016 1808   LABSPEC 1.012 11/17/2016 1808   PHURINE 6.0 11/17/2016 1808   GLUCOSEU NEGATIVE 11/17/2016 1808   HGBUR MODERATE (A) 11/17/2016 1808   BILIRUBINUR NEGATIVE 11/17/2016 1808   KETONESUR NEGATIVE 11/17/2016 1808   PROTEINUR 30 (A) 11/17/2016 1808   NITRITE POSITIVE (A) 11/17/2016 1808   LEUKOCYTESUR LARGE (A) 11/17/2016 1808   Sepsis Labs: (procalcitonin:4,lacticidven:4)  ) Recent Results (from the past 240 hour(s))  Blood Cultures x 2 sites     Status: None   Collection Time: 11/17/16 10:32 PM  Result Value Ref Range Status   Specimen Description BLOOD RIGHT FOREARM  Final   Special Requests   Final    BOTTLES DRAWN AEROBIC AND ANAEROBIC Blood Culture adequate volume   Culture   Final    NO GROWTH 5 DAYS Performed at [facility],  [address], [place], [place] [address]    Report Status 11/23/2016 FINAL  Final  Blood Cultures x 2 sites     Status: None   Collection Time: 11/17/16 10:37 PM  Result Value Ref Range Status   Specimen Description BLOOD LEFT ANTECUBITAL  Final   Special Requests   Final    BOTTLES DRAWN AEROBIC AND ANAEROBIC Blood Culture adequate volume   Culture   Final    NO GROWTH 5 DAYS Performed at [facility], [address] [place], [place] [address]    Report Status 11/23/2016 FINAL  Final  MRSA PCR Screening     Status: None   Collection Time: 11/19/16  6:02 AM  Result Value Ref Range Status   MRSA by PCR NEGATIVE NEGATIVE Final    Comment:        The GeneXpert MRSA Assay (FDA approved for NASAL specimens only), is one component of a comprehensive MRSA colonization surveillance program. It is not intended to diagnose MRSA infection nor to guide or monitor treatment for MRSA infections.          Radiology Studies: No results found.      Scheduled Meds: . aspirin EC  81 mg Oral Daily  . atorvastatin  10 mg Oral Daily  . clopidogrel  75 mg Oral Q breakfast  . docusate sodium  100 mg Oral BID  . enoxaparin (LOVENOX) injection  30 mg Subcutaneous Q24H  . feeding supplement (PRO-STAT SUGAR FREE 64)  30 mL Oral BID  . ferrous sulfate  325 mg Oral Q breakfast  . metoprolol tartrate  25 mg Oral BID  . metroNIDAZOLE  500 mg Oral Q8H  . polyethylene glycol  17 g Oral Daily  . rivastigmine  13.3 mg Transdermal Daily   Continuous Infusions: . cefTRIAXone (ROCEPHIN)  IV Stopped (11/23/16 2203)  . sodium chloride       LOS: 7 days    Time spent: 25    Alberteen Sam, MD [facility] Pager [phone]  If 7PM-7AM, please contact night-coverage [email or web address] Password [email or web address] 11/24/2016, 8:41 AM

## 2016-11-24 NOTE — Progress Notes (Signed)
Nutrition Follow-up  DOCUMENTATION CODES:   Morbid obesity  INTERVENTION:    Continue Pro-stat 30 ml BID, each supplement provides 100 kcal and 15 gm protein.  Add Glucerna Shake PO BID.  NUTRITION DIAGNOSIS:   Increased nutrient needs (for protein) related to wound healing as evidenced by estimated needs.  Ongoing  GOAL:   Patient will meet greater than or equal to 90% of their needs  Unmet  MONITOR:   PO intake, Supplement acceptance, Weight trends, Labs, Skin  ASSESSMENT:   81 y.o. female with medical history significant of hypothyroidism but currently being treated forPVD with recent stent pplacement in right superficial femoral artery and right popliteal artery;  hyperthyroidism; DM; dementia; remote CVA; and CKD presenting after being sent from her SNF with elevated sodium and an infected left leg.  She was given a shot today and had a wound culture.  They just noticed the infection today, "they said it wasn't there yesterday."  She has had a number of ulcers - R second toe ulcer since last year, treated off and on; wound on the back of her left leg, uncertain duration; left leg wound, her daughter never noticed it, so maybe present for a couple of weeks.  No fevers.  She has been hoarse at times, had an appointment last week with endocrinology for hyperthyroidism and was supposed to start medication for this.  Palliative Care Team has been consulted to discuss goals of care with family and patient. Family has declined leg amputation.  Patient is consuming 45-95% of meals. Also receiving Pro-stat BID. Suspect she is not quite meeting nutrition needs. Labs and medications reviewed.  Diet Order:  Diet Carb Modified Fluid consistency: Thin; Room service appropriate? Yes  Skin:  Wound (see comment) (L maleolus wound; L calf unstageable, R heel unstageable )  Last BM:  10/11  Height:   Ht Readings from Last 1 Encounters:  11/19/16  (1.676 m)    Weight:   Wt  Readings from Last 1 Encounters:  11/19/16 256 lb 1.6 oz (116.2 kg)    Ideal Body Weight:  59.09 kg  BMI:  Body mass index is 41.34 kg/m.  Estimated Nutritional Needs:   Kcal:  1800-2000  Protein:  115-130 gm  Fluid:  1.8-2 L  EDUCATION NEEDS:   No education needs identified at this time  Joaquin Courts, RD, LDN, CNSC Pager [phone] After Hours Pager [phone]

## 2016-11-24 NOTE — Progress Notes (Signed)
11/24/2016 Central monitor called patient had a 3.64 pause  And heart rate 30 at 1400. Rn went to assess patient she was asymptomatic and at 1430 heart rate was 66. Md was mad aware via amion. [facility] RN.

## 2016-11-24 NOTE — Care Management Important Message (Signed)
Important Message  Patient Details  Name: Sylvia Medina MRN: [id number] Date of Birth: 1935/09/08   Medicare Important Message Given:  Yes    Leone Haven, RN 11/24/2016, 3:45 PMImportant Message  Patient Details  Name: Sylvia Medina MRN: [id number] Date of Birth: 09/27/1935   Medicare Important Message Given:  Yes    Leone Haven, RN 11/24/2016, 3:45 PM

## 2016-11-25 LAB — BASIC METABOLIC PANEL
Anion gap: 9 (ref 5–15)
BUN: 45 mg/dL — AB (ref 6–20)
CALCIUM: 8.4 mg/dL — AB (ref 8.9–10.3)
CHLORIDE: 114 mmol/L — AB (ref 101–111)
CO2: 17 mmol/L — ABNORMAL LOW (ref 22–32)
CREATININE: 2.28 mg/dL — AB (ref 0.44–1.00)
GFR calc Af Amer: 22 mL/min — ABNORMAL LOW (ref >60)
GFR calc non Af Amer: 19 mL/min — ABNORMAL LOW (ref >60)
Glucose, Bld: 148 mg/dL — ABNORMAL HIGH (ref 65–99)
Potassium: 3.1 mmol/L — ABNORMAL LOW (ref 3.5–5.1)
SODIUM: 140 mmol/L (ref 135–145)

## 2016-11-25 LAB — CBC
HCT: 24.9 % — ABNORMAL LOW (ref 36.0–46.0)
HEMOGLOBIN: 8 g/dL — AB (ref 12.0–15.0)
MCH: 25.2 pg — AB (ref 26.0–34.0)
MCHC: 32.1 g/dL (ref 30.0–36.0)
MCV: 78.3 fL (ref 78.0–100.0)
PLATELETS: 454 10*3/uL — AB (ref 150–400)
RBC: 3.18 MIL/uL — ABNORMAL LOW (ref 3.87–5.11)
RDW: 18.5 % — AB (ref 11.5–15.5)
WBC: 12.2 10*3/uL — ABNORMAL HIGH (ref 4.0–10.5)

## 2016-11-25 MED ORDER — POTASSIUM CHLORIDE IN NACL 20-0.9 MEQ/L-% IV SOLN
INTRAVENOUS | Status: DC
  Administered 2016-11-25: 125 mL/h via INTRAVENOUS
  Administered 2016-11-26 (×2): via INTRAVENOUS
  Filled 2016-11-25 (×3): qty 1000

## 2016-11-25 MED ORDER — SODIUM CHLORIDE 0.9% FLUSH
10.0000 mL | INTRAVENOUS | Status: DC | PRN
  Administered 2016-11-26 – 2016-12-02 (×6): 10 mL
  Administered 2016-12-05: 20 mL
  Filled 2016-11-25 (×7): qty 40

## 2016-11-25 MED ORDER — METOPROLOL TARTRATE 12.5 MG HALF TABLET
12.5000 mg | ORAL_TABLET | Freq: Two times a day (BID) | ORAL | Status: DC
  Administered 2016-11-25 – 2016-12-05 (×20): 12.5 mg via ORAL
  Filled 2016-11-25 (×21): qty 1

## 2016-11-25 MED ORDER — CHLORHEXIDINE GLUCONATE CLOTH 2 % EX PADS
6.0000 | MEDICATED_PAD | Freq: Every day | CUTANEOUS | Status: DC
  Administered 2016-11-26 – 2016-11-27 (×2): 6 via TOPICAL

## 2016-11-25 NOTE — Progress Notes (Signed)
Palliative Medicine RN Note: Daughter returned call yesterday but cannot meet until 7pm or later d/t work schedule. Rec'd the message this morning and called her back; we have a PMT provider here tomorrow who can meet with her if she can come in during the day.   We will continue to attempt contact. If other family members show up, please contact the team with their names and phone numbers.  Margret Chance Tumeka Chimenti, RN, BSN, [facility] 11/25/2016 9:22 AM Cell [phone] 8:00-4:00 Monday-Friday Office [phone]

## 2016-11-25 NOTE — Plan of Care (Signed)
Problem: Physical Regulation: Goal: Ability to maintain clinical measurements within normal limits will improve Outcome: Progressing Discussed with patient and family plan of care for the evening and temperature regulation with some teach back displayed

## 2016-11-25 NOTE — Plan of Care (Signed)
Problem: Education: Goal: Knowledge of [facility] Education information/materials will improve Outcome: Progressing Discussed with patient and family plan of care for the evening, importance of wound care and turning with some teach back displayed.  Talked about IV restart and able to do blood draws from the new line.  Daughter called and discussed her daily activities and she will try to come in the morning around breakfast.

## 2016-11-25 NOTE — Progress Notes (Signed)
PROGRESS NOTE    Sylvia Medina  UEA:[id number] DOB: 1935/03/27 DOA: 11/17/2016 PCP: System, Pcp Not In      Brief Narrative:  81 y.o.femalewith mild dementia, PVD with recent stent pplacement in right superficial femoral artery and right popliteal artery; hyperthyroidism; DM; remote CVA; and CKD presenting after being sent from her SNF with elevated sodium and an infected left leg.   Shortly after admission patient became hypothermic requiring stepdown transfer. Started on broad-spectrum IV antibiotics for her lower extremity ulcers. Orthopedic surgery was consulted and have recommended amputation, however family is deferring this for now. Vascular surgery was consulted over the phone and stated that they have nothing else to offer at this point.  Was discussed with ID who said 4-6 weeks ceftriaxone/Flagyl would be reasonable alternative, if stable.  The patient however, remained hypotensive and hypothermic requiring warming blanket and IV fluids.  She was evaluated by Palliative care.  Family were not reachable.   Assessment & Plan:  Principal Problem:   Lower extremity ulceration (HCC) Active Problems:   Diabetes mellitus type 2, diet-controlled (HCC)   Dementia without behavioral disturbance   Essential hypertension, benign   Anemia of chronic disease   Hypernatremia   CKD (chronic kidney disease) stage 4, GFR 15-29 ml/min (HCC)   Hyperthyroidism  Bilateral lower extremity ulcers In the setting of diabetes and moderate-to-severe PVD bilaterally.  Dr. Lajoyce Corners has offered left trans-tib amputation, but family have deferred.  This would be high risk to dehisce.  Vascular surgery have nothing to offer.  Patient has dementia and is non-ambulatory.  The patient's wound appears black and necrotic.family were not reachable yesterday.  Per ID by phone, would be reasonable to place PICC and continue ceftriaxone/Flagyl for 4-6 weeks at SNF. -Continue CTX/flagyl -BP improved last 24  hours, will reconsider for SNF in next 1-2 days, pending family discussion -Palliative input appreciated -Oxycodone, acetaminophen PRN for pain -Continue stress steroids for 2 more days    Chronic kidney disease stage IV Baseline Cr 1.9, now 2.2, stable during admission.  Hypothermia TSH and cortisol normal.  Presumed from infected leg ulcers.  Improved today.   -Continue stress steroids  Peripheral vascular disease Not intervention candidate -Continue statin, aspirin, Plavix  Hypertension BP improved -Hold HCTZ -Metoprolol dose lowered  Dementia -Continue Rivastigmine  Hyperlipidemia -Continue statin  Hyperthyroidism TSH normal. Has outpatient follow up. -Hold Tapazole  Buttock ulcer WOC have evaluated and believe this is not a pressure ulcer.  Hypernatremia Normalized     DVT prophylaxis: Lovenox Code Status: FULL code Family Communication: None present; called daughter again this morning, she answered then call was dropped.  I called back immediately and went to Voicemail.  Left VM, awaiting call back. Disposition Plan: If maintains BP stable today, consider transfer to floor this afternoon.  Palliative Care consultation appreciated. Overall her prognosis appears poor. If it becomes obvious that she is stable for SNF, this will be entertained.  In the meantime, we need to discuss goals of care again with family and Palliative and myself if it truly is the case that her only options are amputation versus hospice care.   Consultants:   Vascular Surgery, Orthopedics, Palliative care  Procedures:   None  Antimicrobials:   Vancomycin 10/4 >> 10/8  Ceftriaxone 10/4 >>  Metronidazole 10/4 >>    Subjective: Feeling okay at present.  Foot pain.  No chest pain.  Overall very weak.  Not oriented still.  Objective: Vitals:   11/25/16 0400 11/25/16 0500  11/25/16 0600 11/25/16 0736  BP:    (!) 101/44  Pulse: 67 65 75 70  Resp: Temp:     98.3 F (36.8 C)  TempSrc:    Oral  SpO2: 98% 99% 99% 99%  Weight:      Height:        Intake/Output Summary (Last 24 hours) at 11/25/16 0751 Last data filed at 11/25/16 0300  Gross per 24 hour  Intake              750 ml  Output             2000 ml  Net            -1250 ml   Filed Weights   11/17/16 2343 11/19/16 0549  Weight: 114.3 kg (252 lb) 116.2 kg (256 lb 1.6 oz)    Examination:  General exam: Tired appearing, very weak/debilitated. Not oriented to place, situation. Respiratory system: Clear to auscultation. Respiratory effort normal. Cardiovascular system: S1 & S2 heard, RRR. Soft SEM.  No JVD.  No pedal edema. Gastrointestinal system: Abdomen is nondistended, soft and nontender.  Central nervous system: Rouses easily.  Not oriented to place, knows she is in [place].  Doesn't recognize me or where she is, or what year it is.  Asks, "why am I here?" Extremities: The bilateral legs have necrotic open ulcers with slight surrounding erythema     .Psychiatry: Judgement and insight appear poor due to dementia. Mood & affect blunted.     Data Reviewed: I have personally reviewed following labs and imaging studies  CBC:  Recent Labs Lab 11/19/16 0428 11/20/16 0412 11/21/16 0747 11/23/16 0514 11/24/16 0831  WBC 10.9* 13.5* 14.7* 14.7* 8.9  HGB 8.5* 8.2* 8.3* 8.4* 7.4*  HCT 27.6* 26.4* 26.7* 24.6* 23.0*  MCV 82.1 81.2 79.7 77.1* 78.2  PLT 196 277 289 330 411*   Basic Metabolic Panel:  Recent Labs Lab 11/19/16 0428 11/20/16 0412 11/21/16 0747 11/23/16 0514 11/24/16 0710 11/24/16 0831  NA 140 137 133* 135  --  137  K 3.2* 3.6 3.3* 3.5  --  3.0*  CL 110 109 106 106  --  112*  CO2 20* 18* 18* 17*  --  20*  GLUCOSE 100* 106* 104* 89  --  94  BUN 40* 41* 45* 43*  --  38*  CREATININE 1.93* 2.18* 2.29* 2.20* 2.12* 2.10*  CALCIUM 8.3* 8.2* 8.4* 8.5*  --  8.4*   GFR: Estimated Creatinine Clearance: 28.2 mL/min (A) (by C-G formula based on SCr of 2.1  mg/dL (H)). Liver Function Tests:  Recent Labs Lab 11/18/16 1415 11/19/16 0428  AST 45* 123*  ALT 28 51  ALKPHOS 163* 222*  BILITOT 0.8 0.6  PROT 5.9* 5.6*  ALBUMIN 1.8* 1.7*   No results for input(s): LIPASE, AMYLASE in the last 168 hours. No results for input(s): AMMONIA in the last 168 hours. Coagulation Profile: No results for input(s): INR, PROTIME in the last 168 hours. Cardiac Enzymes: No results for input(s): CKTOTAL, CKMB, CKMBINDEX, TROPONINI in the last 168 hours. BNP (last 3 results) No results for input(s): PROBNP in the last 8760 hours. HbA1C: No results for input(s): HGBA1C in the last 72 hours. CBG:  Recent Labs Lab 11/18/16 1209 11/19/16 0352  GLUCAP 129* 94   Lipid Profile: No results for input(s): CHOL, HDL, LDLCALC, TRIG, CHOLHDL, LDLDIRECT in the last 72 hours. Thyroid Function Tests: No results for input(s): TSH, T4TOTAL, FREET4, T3FREE,  THYROIDAB in the last 72 hours. Anemia Panel: No results for input(s): VITAMINB12, FOLATE, FERRITIN, TIBC, IRON, RETICCTPCT in the last 72 hours. Urine analysis:    Component Value Date/Time   COLORURINE YELLOW 11/17/2016 1808   APPEARANCEUR HAZY (A) 11/17/2016 1808   LABSPEC 1.012 11/17/2016 1808   PHURINE 6.0 11/17/2016 1808   GLUCOSEU NEGATIVE 11/17/2016 1808   HGBUR MODERATE (A) 11/17/2016 1808   BILIRUBINUR NEGATIVE 11/17/2016 1808   KETONESUR NEGATIVE 11/17/2016 1808   PROTEINUR 30 (A) 11/17/2016 1808   NITRITE POSITIVE (A) 11/17/2016 1808   LEUKOCYTESUR LARGE (A) 11/17/2016 1808   Sepsis Labs: (procalcitonin:4,lacticidven:4)  ) Recent Results (from the past 240 hour(s))  Blood Cultures x 2 sites     Status: None   Collection Time: 11/17/16 10:32 PM  Result Value Ref Range Status   Specimen Description BLOOD RIGHT FOREARM  Final   Special Requests   Final    BOTTLES DRAWN AEROBIC AND ANAEROBIC Blood Culture adequate volume   Culture   Final    NO GROWTH 5 DAYS Performed at [facility], [address] [place], [place] [address]    Report Status 11/23/2016 FINAL  Final  Blood Cultures x 2 sites     Status: None   Collection Time: 11/17/16 10:37 PM  Result Value Ref Range Status   Specimen Description BLOOD LEFT ANTECUBITAL  Final   Special Requests   Final    BOTTLES DRAWN AEROBIC AND ANAEROBIC Blood Culture adequate volume   Culture   Final    NO GROWTH 5 DAYS Performed at [facility], [address] [place], [place] [address]    Report Status 11/23/2016 FINAL  Final  MRSA PCR Screening     Status: None   Collection Time: 11/19/16  6:02 AM  Result Value Ref Range Status   MRSA by PCR NEGATIVE NEGATIVE Final    Comment:        The GeneXpert MRSA Assay (FDA approved for NASAL specimens only), is one component of a comprehensive MRSA colonization surveillance program. It is not intended to diagnose MRSA infection nor to guide or monitor treatment for MRSA infections.          Radiology Studies: No results found.      Scheduled Meds: . aspirin EC  81 mg Oral Daily  . atorvastatin  10 mg Oral Daily  . clopidogrel  75 mg Oral Q breakfast  . docusate sodium  100 mg Oral BID  . enoxaparin (LOVENOX) injection  30 mg Subcutaneous Q24H  . feeding supplement (GLUCERNA SHAKE)  237 mL Oral BID BM  . feeding supplement (PRO-STAT SUGAR FREE 64)  30 mL Oral BID  . ferrous sulfate  325 mg Oral Q breakfast  . hydrocortisone sod succinate (SOLU-CORTEF) inj  50 mg Intravenous Q12H  . metoprolol tartrate  25 mg Oral BID  . metroNIDAZOLE  500 mg Oral Q8H  . polyethylene glycol  17 g Oral Daily  . rivastigmine  13.3 mg Transdermal Daily   Continuous Infusions: . cefTRIAXone (ROCEPHIN)  IV Stopped (11/24/16 2254)     LOS: 8 days    Time spent: 25    Alberteen Sam, MD [facility] Pager [phone]  If 7PM-7AM, please contact night-coverage [email or web address] Password [email or web address] 11/25/2016, 7:51 AM

## 2016-11-26 DIAGNOSIS — Z515 Encounter for palliative care: Secondary | ICD-10-CM

## 2016-11-26 MED ORDER — POTASSIUM CHLORIDE IN NACL 40-0.9 MEQ/L-% IV SOLN
INTRAVENOUS | Status: DC
  Administered 2016-11-26 – 2016-11-27 (×3): 125 mL/h via INTRAVENOUS
  Filled 2016-11-26 (×3): qty 1000

## 2016-11-26 NOTE — Progress Notes (Signed)
PROGRESS NOTE    Sylvia Medina  JWJ:[id number] DOB: 03/17/1935 DOA: 11/17/2016 PCP: System, Pcp Not In      Brief Narrative:  81 y.o.femalewith mild dementia, PVD with recent stent pplacement in right superficial femoral artery and right popliteal artery; hyperthyroidism; DM; remote CVA; and CKD presenting after being sent from her SNF with elevated sodium and an infected left leg.   Shortly after admission patient became hypothermic requiring stepdown transfer. Started on broad-spectrum IV antibiotics for her lower extremity ulcers. Orthopedic surgery was consulted and have recommended amputation, however family is deferring this for now. Vascular surgery was consulted over the phone and stated that they have nothing else to offer at this point.  Was discussed with ID who said 4-6 weeks ceftriaxone/Flagyl would be reasonable alternative, if stable.  The patient however, remained hypotensive and hypothermic requiring warming blanket and IV fluids.  She was evaluated by Palliative care.  Family were not reachable.   Assessment & Plan:  Principal Problem:   Lower extremity ulceration (HCC) Active Problems:   Diabetes mellitus type 2, diet-controlled (HCC)   Dementia without behavioral disturbance   Essential hypertension, benign   Anemia of chronic disease   Hypernatremia   CKD (chronic kidney disease) stage 4, GFR 15-29 ml/min (HCC)   Hyperthyroidism   Palliative care encounter  Bilateral lower extremity ulcers In the setting of diabetes and moderate-to-severe PVD bilaterally.  Dr. Lajoyce Corners has offered left trans-tib amputation, but family have deferred.  This would be high risk to dehisce.  Vascular surgery have nothing to offer.  Patient has dementia and is non-ambulatory.  The patient's wound appears black and necrotic.family were not reachable yesterday.  Per ID by phone, would be reasonable to place PICC and continue ceftriaxone/Flagyl for 4-6 weeks at SNF. -Continue  CTX/flagyl -Consider transfer to floor tonight or tomorrow -Consult to Palliative Care -Continue stress steroids 1 more day -Oxycodone for pain   Chronic kidney disease stage IV Baseline Cr 1.9, now 2.2, stable during admission.  Hypothermia TSH and cortisol normal.  Presumed from infected leg ulcers.  Improved today.   -Continue stress steroids  Peripheral vascular disease Not intervention candidate -Continue statin, aspirin, Plavix  Hypertension BP improved -Hold HCTZ -Metoprolol dose lowered  Dementia -Continue Rivastigmine  Hyperlipidemia -Continue statin  Hyperthyroidism TSH normal. Has outpatient follow up. -Hold Tapazole  Buttock ulcer WOC have evaluated and believe this is not a pressure ulcer.  Hypernatremia Normalized     DVT prophylaxis: Lovenox Code Status: FULL code Family Communication: Spoke with daughter yesterday Disposition Plan: If maintains BP stable today and no hypothermia, consider transfer to floor this afternoon.  Palliative Care consultation appreciated. Overall her prognosis appears poor. If it becomes obvious that she is stable for SNF, this will be entertained.  In the meantime, we need to discuss goals of care again with family and Palliative and myself if it truly is the case that her only options are amputation versus hospice care.   Consultants:   Vascular Surgery, Orthopedics, Palliative care  Procedures:   None  Antimicrobials:   Vancomycin 10/4 >> 10/8  Ceftriaxone 10/4 >>  Metronidazole 10/4 >>    Subjective: Tired.  Overall still weak.  Still with pain any time the feet are touched.    Objective: Vitals:   11/26/16 0850 11/26/16 1200 11/26/16 1337 11/26/16 1556  BP: (!) 117/50 (!) 122/50  (!) 132/47  Pulse: 83  64   Resp: Temp: 98.6 F (37  C) (!) 97.5 F (36.4 C) (!) 97.4 F (36.3 C) (!) 96.9 F (36.1 C)  TempSrc: Oral Oral Oral Axillary  SpO2: 100% 100%    Weight:      Height:         Intake/Output Summary (Last 24 hours) at 11/26/16 1611 Last data filed at 11/26/16 1015  Gross per 24 hour  Intake             2175 ml  Output              350 ml  Net             1825 ml   Filed Weights   11/17/16 2343 11/19/16 0549  Weight: 114.3 kg (252 lb) 116.2 kg (256 lb 1.6 oz)    Examination:  General exam: Tired appearing, very weak/debilitated. Not oriented to place, situation. Respiratory system: Clear to auscultation. Respiratory effort normal. Cardiovascular system: S1 & S2 heard, RRR. Soft SEM.  No JVD.  No pedal edema. Gastrointestinal system: Abdomen is nondistended, soft and nontender.  Central nervous system: Rouses easily.  Not oriented to place, knows she is in [place].  Doesn't recognize me or where she is, or what year it is.  Asks, "why am I here?" Extremities: The bilateral legs have necrotic open ulcers with slight surrounding erythema Psychiatry: Judgement and insight appear poor due to dementia. Mood & affect blunted.     Data Reviewed: I have personally reviewed following labs and imaging studies  CBC:  Recent Labs Lab 11/20/16 0412 11/21/16 0747 11/23/16 0514 11/24/16 0831 11/25/16 0807  WBC 13.5* 14.7* 14.7* 8.9 12.2*  HGB 8.2* 8.3* 8.4* 7.4* 8.0*  HCT 26.4* 26.7* 24.6* 23.0* 24.9*  MCV 81.2 79.7 77.1* 78.2 78.3  PLT 277 289 330 411* 454*   Basic Metabolic Panel:  Recent Labs Lab 11/20/16 0412 11/21/16 0747 11/23/16 0514 11/24/16 0710 11/24/16 0831 11/25/16 0807  NA 137 133* 135  --  137 140  K 3.6 3.3* 3.5  --  3.0* 3.1*  CL 109 106 106  --  112* 114*  CO2 18* 18* 17*  --  20* 17*  GLUCOSE 106* 104* 89  --  94 148*  BUN 41* 45* 43*  --  38* 45*  CREATININE 2.18* 2.29* 2.20* 2.12* 2.10* 2.28*  CALCIUM 8.2* 8.4* 8.5*  --  8.4* 8.4*   GFR: Estimated Creatinine Clearance: 25.9 mL/min (A) (by C-G formula based on SCr of 2.28 mg/dL (H)). Liver Function Tests: No results for input(s): AST, ALT, ALKPHOS, BILITOT, PROT,  ALBUMIN in the last 168 hours. No results for input(s): LIPASE, AMYLASE in the last 168 hours. No results for input(s): AMMONIA in the last 168 hours. Coagulation Profile: No results for input(s): INR, PROTIME in the last 168 hours. Cardiac Enzymes: No results for input(s): CKTOTAL, CKMB, CKMBINDEX, TROPONINI in the last 168 hours. BNP (last 3 results) No results for input(s): PROBNP in the last 8760 hours. HbA1C: No results for input(s): HGBA1C in the last 72 hours. CBG: No results for input(s): GLUCAP in the last 168 hours. Lipid Profile: No results for input(s): CHOL, HDL, LDLCALC, TRIG, CHOLHDL, LDLDIRECT in the last 72 hours. Thyroid Function Tests: No results for input(s): TSH, T4TOTAL, FREET4, T3FREE, THYROIDAB in the last 72 hours. Anemia Panel: No results for input(s): VITAMINB12, FOLATE, FERRITIN, TIBC, IRON, RETICCTPCT in the last 72 hours. Urine analysis:    Component Value Date/Time   COLORURINE YELLOW 11/17/2016 1808   APPEARANCEUR HAZY (A)  11/17/2016 1808   LABSPEC 1.012 11/17/2016 1808   PHURINE 6.0 11/17/2016 1808   GLUCOSEU NEGATIVE 11/17/2016 1808   HGBUR MODERATE (A) 11/17/2016 1808   BILIRUBINUR NEGATIVE 11/17/2016 1808   KETONESUR NEGATIVE 11/17/2016 1808   PROTEINUR 30 (A) 11/17/2016 1808   NITRITE POSITIVE (A) 11/17/2016 1808   LEUKOCYTESUR LARGE (A) 11/17/2016 1808   Sepsis Labs: (procalcitonin:4,lacticidven:4)  ) Recent Results (from the past 240 hour(s))  Blood Cultures x 2 sites     Status: None   Collection Time: 11/17/16 10:32 PM  Result Value Ref Range Status   Specimen Description BLOOD RIGHT FOREARM  Final   Special Requests   Final    BOTTLES DRAWN AEROBIC AND ANAEROBIC Blood Culture adequate volume   Culture   Final    NO GROWTH 5 DAYS Performed at [facility], [address], [place], [place] [address]    Report Status 11/23/2016 FINAL  Final  Blood Cultures x 2 sites     Status: None   Collection Time: 11/17/16  10:37 PM  Result Value Ref Range Status   Specimen Description BLOOD LEFT ANTECUBITAL  Final   Special Requests   Final    BOTTLES DRAWN AEROBIC AND ANAEROBIC Blood Culture adequate volume   Culture   Final    NO GROWTH 5 DAYS Performed at [facility], [address], [place], [place] [address]    Report Status 11/23/2016 FINAL  Final  MRSA PCR Screening     Status: None   Collection Time: 11/19/16  6:02 AM  Result Value Ref Range Status   MRSA by PCR NEGATIVE NEGATIVE Final    Comment:        The GeneXpert MRSA Assay (FDA approved for NASAL specimens only), is one component of a comprehensive MRSA colonization surveillance program. It is not intended to diagnose MRSA infection nor to guide or monitor treatment for MRSA infections.          Radiology Studies: No results found.      Scheduled Meds: . aspirin EC  81 mg Oral Daily  . atorvastatin  10 mg Oral Daily  . Chlorhexidine Gluconate Cloth  6 each Topical Daily  . clopidogrel  75 mg Oral Q breakfast  . docusate sodium  100 mg Oral BID  . enoxaparin (LOVENOX) injection  30 mg Subcutaneous Q24H  . feeding supplement (GLUCERNA SHAKE)  237 mL Oral BID BM  . feeding supplement (PRO-STAT SUGAR FREE 64)  30 mL Oral BID  . ferrous sulfate  325 mg Oral Q breakfast  . hydrocortisone sod succinate (SOLU-CORTEF) inj  50 mg Intravenous Q12H  . metoprolol tartrate  12.5 mg Oral BID  . metroNIDAZOLE  500 mg Oral Q8H  . polyethylene glycol  17 g Oral Daily  . rivastigmine  13.3 mg Transdermal Daily   Continuous Infusions: . 0.9 % NaCl with KCl 40 mEq / L    . cefTRIAXone (ROCEPHIN)  IV Stopped (11/25/16 2211)     LOS: 9 days    Time spent: 15    Alberteen Sam, MD [facility] Pager [phone]  If 7PM-7AM, please contact night-coverage [email or web address] Password [email or web address] 11/26/2016, 4:11 PM

## 2016-11-26 NOTE — Progress Notes (Signed)
Pt had a 3.11 second pause. Pt asymptomatic. Linton Flemings, NP notified.

## 2016-11-26 NOTE — Progress Notes (Signed)
Spoke to pt's daughter, Ramonita Koenig. Appt set for 11/27/16 at 1100 am for GOC discussion. Thank you, Eduard Roux, ANP

## 2016-11-27 DIAGNOSIS — Z7189 Other specified counseling: Secondary | ICD-10-CM

## 2016-11-27 DIAGNOSIS — Z515 Encounter for palliative care: Secondary | ICD-10-CM

## 2016-11-27 LAB — CBC
HCT: 24.5 % — ABNORMAL LOW (ref 36.0–46.0)
HEMOGLOBIN: 7.8 g/dL — AB (ref 12.0–15.0)
MCH: 25.6 pg — AB (ref 26.0–34.0)
MCHC: 31.8 g/dL (ref 30.0–36.0)
MCV: 80.3 fL (ref 78.0–100.0)
PLATELETS: 536 10*3/uL — AB (ref 150–400)
RBC: 3.05 MIL/uL — AB (ref 3.87–5.11)
RDW: 19.8 % — ABNORMAL HIGH (ref 11.5–15.5)
WBC: 12.5 10*3/uL — ABNORMAL HIGH (ref 4.0–10.5)

## 2016-11-27 LAB — BASIC METABOLIC PANEL
Anion gap: 4 — ABNORMAL LOW (ref 5–15)
BUN: 50 mg/dL — AB (ref 6–20)
CHLORIDE: 125 mmol/L — AB (ref 101–111)
CO2: 15 mmol/L — ABNORMAL LOW (ref 22–32)
Calcium: 8.2 mg/dL — ABNORMAL LOW (ref 8.9–10.3)
Creatinine, Ser: 2.37 mg/dL — ABNORMAL HIGH (ref 0.44–1.00)
GFR calc non Af Amer: 18 mL/min — ABNORMAL LOW (ref >60)
GFR, EST AFRICAN AMERICAN: 21 mL/min — AB (ref >60)
Glucose, Bld: 128 mg/dL — ABNORMAL HIGH (ref 65–99)
POTASSIUM: 4.5 mmol/L (ref 3.5–5.1)
SODIUM: 144 mmol/L (ref 135–145)

## 2016-11-27 NOTE — Progress Notes (Signed)
PROGRESS NOTE    Sylvia Medina  ZOX:[id number] DOB: [birth year] DOA: 11/17/2016 PCP: System, Pcp Not In      Brief Narrative:  81 y.o.femalewith mild dementia, PVD with recent stent pplacement in right superficial femoral artery and right popliteal artery; hyperthyroidism; DM; remote CVA; and CKD presenting after being sent from her SNF with elevated sodium and an infected left leg.   Shortly after admission patient became hypothermic requiring stepdown transfer. Started on broad-spectrum IV antibiotics for her lower extremity ulcers. Orthopedic surgery was consulted and have recommended amputation, however family is deferring this for now. Vascular surgery was consulted over the phone and stated that they have nothing else to offer at this point.  Was discussed with ID who said 4-6 weeks ceftriaxone/Flagyl would be reasonable alternative, if stable.  The patient however, remained hypotensive and hypothermic requiring warming blanket and IV fluids.  She was evaluated by Palliative care.     Assessment & Plan:  Principal Problem:   Lower extremity ulceration (HCC) Active Problems:   Diabetes mellitus type 2, diet-controlled (HCC)   Dementia without behavioral disturbance   Essential hypertension, benign   Anemia of chronic disease   Hypernatremia   CKD (chronic kidney disease) stage 4, GFR 15-29 ml/min (HCC)   Hyperthyroidism   Palliative care encounter  Bilateral lower extremity ulcers In the setting of diabetes and moderate-to-severe PVD bilaterally.  Dr. Lajoyce Corners has offered left trans-tib amputation, but family have deferred understandably.  This would be high risk to dehisce.  Vascular surgery have nothing to offer.  Patient has dementia and is non-ambulatory.  The patient's wound appears black and necrotic.  Per ID by phone, would be reasonable to place PICC and continue ceftriaxone/Flagyl for 4-6 weeks at SNF, although without healing of her ulcers/skin breakdown, tampening  down the infection is likely only temporary.  It is not clear that there are any curative options for Sylvia Medina. Has been off warmer for 24 hours and maintained temperature -Continue CTX/flagyl -Transfer to floor today -Consult to Palliative Care -Stop IV fluids and monitor blood pressure -Continue stress steroids until 10/14 -Oxycodone for pain   Chronic kidney disease stage IV Baseline Cr 1.9, now 2.2, stable during admission.  Hypothermia TSH and cortisol normal.  Presumed from infected leg ulcers.  Improved today.   -Continue stress steroids one more day  Peripheral vascular disease Not intervention candidate -Continue statin, aspirin, Plavix  Hypertension BP improved -Hold HCTZ -Metoprolol dose lowered  Dementia -Continue Rivastigmine  Hyperlipidemia -Continue statin  Hyperthyroidism TSH normal. Has outpatient follow up. -Hold Tapazole  Buttock ulcer WOC have evaluated and believe this is not a pressure ulcer.  Hypernatremia Normalized     DVT prophylaxis: Lovenox Code Status: FULL code Family Communication: Family meeting with palliative care today Disposition Plan: Transfer to floor today.  During [employer], we discussed our hope that patient would be able to continue to improve on the floor over the next 1-2 days, and that we would begin preparations for return to [facility] with PICC and long-term IV antibiotics.  If she deteriorates again (especially as steroids and fluids are tapered), we have discussed that this likely is the body's way of declaring that antibiotics alone will not be curative, and that Hospice should be considered.  Daughter reluctant to consider Hospice, but overall seems to understand best and worst case scenarios.   Consultants:   Vascular Surgery, Orthopedics, Palliative care  Procedures:   None  Antimicrobials:   Vancomycin 10/4 >> 10/8  Ceftriaxone 10/4 >>  Metronidazole 10/4 >>    Subjective: Feels  better today.  Overall still weak. Pain in feet improved.    Objective: Vitals:   11/26/16 2352 11/27/16 0400 11/27/16 0725 11/27/16 0910  BP: (!) 99/49 (!) 118/49 (!) 99/43 (!) 114/40  Pulse: (!) 56   71  Resp: (!) 21 14    Temp: 97.6 F (36.4 C) 98.2 F (36.8 C) 98.4 F (36.9 C)   TempSrc: Axillary Oral Oral   SpO2: 100% 100%    Weight:      Height:        Intake/Output Summary (Last 24 hours) at 11/27/16 1132 Last data filed at 11/27/16 0600  Gross per 24 hour  Intake           1372.5 ml  Output              230 ml  Net           1142.5 ml   Filed Weights   11/17/16 2343 11/19/16 0549  Weight: 114.3 kg (252 lb) 116.2 kg (256 lb 1.6 oz)    Examination:  General exam: Tired appearing, very weak/debilitated. Not oriented to place, situation.  Smiles today. Respiratory system: Clear to auscultation. Respiratory effort normal. Cardiovascular system: S1 & S2 heard, RRR. Soft SEM.  No JVD.  No pedal edema. Gastrointestinal system: Abdomen is nondistended, soft and nontender.  Central nervous system: Awake, watching TV.   Not oriented to place, knows she is in [place].   Extremities: The bilateral legs have necrotic open ulcers with slight surrounding erythema Psychiatry: Judgement and insight appear poor due to dementia. Mood & affect blunted.     Data Reviewed: I have personally reviewed following labs and imaging studies  CBC:  Recent Labs Lab 11/21/16 0747 11/23/16 0514 11/24/16 0831 11/25/16 0807  WBC 14.7* 14.7* 8.9 12.2*  HGB 8.3* 8.4* 7.4* 8.0*  HCT 26.7* 24.6* 23.0* 24.9*  MCV 79.7 77.1* 78.2 78.3  PLT 289 330 411* 454*   Basic Metabolic Panel:  Recent Labs Lab 11/21/16 0747 11/23/16 0514 11/24/16 0710 11/24/16 0831 11/25/16 0807  NA 133* 135  --  137 140  K 3.3* 3.5  --  3.0* 3.1*  CL 106 106  --  112* 114*  CO2 18* 17*  --  20* 17*  GLUCOSE 104* 89  --  94 148*  BUN 45* 43*  --  38* 45*  CREATININE 2.29* 2.20* 2.12* 2.10* 2.28*    CALCIUM 8.4* 8.5*  --  8.4* 8.4*   GFR: Estimated Creatinine Clearance: 25.9 mL/min (A) (by C-G formula based on SCr of 2.28 mg/dL (H)). Liver Function Tests: No results for input(s): AST, ALT, ALKPHOS, BILITOT, PROT, ALBUMIN in the last 168 hours. No results for input(s): LIPASE, AMYLASE in the last 168 hours. No results for input(s): AMMONIA in the last 168 hours. Coagulation Profile: No results for input(s): INR, PROTIME in the last 168 hours. Cardiac Enzymes: No results for input(s): CKTOTAL, CKMB, CKMBINDEX, TROPONINI in the last 168 hours. BNP (last 3 results) No results for input(s): PROBNP in the last 8760 hours. HbA1C: No results for input(s): HGBA1C in the last 72 hours. CBG: No results for input(s): GLUCAP in the last 168 hours. Lipid Profile: No results for input(s): CHOL, HDL, LDLCALC, TRIG, CHOLHDL, LDLDIRECT in the last 72 hours. Thyroid Function Tests: No results for input(s): TSH, T4TOTAL, FREET4, T3FREE, THYROIDAB in the last 72 hours. Anemia Panel: No results for input(s): VITAMINB12, FOLATE,  FERRITIN, TIBC, IRON, RETICCTPCT in the last 72 hours. Urine analysis:    Component Value Date/Time   COLORURINE YELLOW 11/17/2016 1808   APPEARANCEUR HAZY (A) 11/17/2016 1808   LABSPEC 1.012 11/17/2016 1808   PHURINE 6.0 11/17/2016 1808   GLUCOSEU NEGATIVE 11/17/2016 1808   HGBUR MODERATE (A) 11/17/2016 1808   BILIRUBINUR NEGATIVE 11/17/2016 1808   KETONESUR NEGATIVE 11/17/2016 1808   PROTEINUR 30 (A) 11/17/2016 1808   NITRITE POSITIVE (A) 11/17/2016 1808   LEUKOCYTESUR LARGE (A) 11/17/2016 1808   Sepsis Labs: (procalcitonin:4,lacticidven:4)  ) Recent Results (from the past 240 hour(s))  Blood Cultures x 2 sites     Status: None   Collection Time: 11/17/16 10:32 PM  Result Value Ref Range Status   Specimen Description BLOOD RIGHT FOREARM  Final   Special Requests   Final    BOTTLES DRAWN AEROBIC AND ANAEROBIC Blood Culture adequate volume    Culture   Final    NO GROWTH 5 DAYS Performed at [facility], [address], [place], [place] [address]    Report Status 11/23/2016 FINAL  Final  Blood Cultures x 2 sites     Status: None   Collection Time: 11/17/16 10:37 PM  Result Value Ref Range Status   Specimen Description BLOOD LEFT ANTECUBITAL  Final   Special Requests   Final    BOTTLES DRAWN AEROBIC AND ANAEROBIC Blood Culture adequate volume   Culture   Final    NO GROWTH 5 DAYS Performed at [facility], [address], [place], [place] [address]    Report Status 11/23/2016 FINAL  Final  MRSA PCR Screening     Status: None   Collection Time: 11/19/16  6:02 AM  Result Value Ref Range Status   MRSA by PCR NEGATIVE NEGATIVE Final    Comment:        The GeneXpert MRSA Assay (FDA approved for NASAL specimens only), is one component of a comprehensive MRSA colonization surveillance program. It is not intended to diagnose MRSA infection nor to guide or monitor treatment for MRSA infections.          Radiology Studies: No results found.      Scheduled Meds: . aspirin EC  81 mg Oral Daily  . atorvastatin  10 mg Oral Daily  . Chlorhexidine Gluconate Cloth  6 each Topical Daily  . clopidogrel  75 mg Oral Q breakfast  . docusate sodium  100 mg Oral BID  . enoxaparin (LOVENOX) injection  30 mg Subcutaneous Q24H  . feeding supplement (GLUCERNA SHAKE)  237 mL Oral BID BM  . feeding supplement (PRO-STAT SUGAR FREE 64)  30 mL Oral BID  . ferrous sulfate  325 mg Oral Q breakfast  . hydrocortisone sod succinate (SOLU-CORTEF) inj  50 mg Intravenous Q12H  . metoprolol tartrate  12.5 mg Oral BID  . metroNIDAZOLE  500 mg Oral Q8H  . polyethylene glycol  17 g Oral Daily  . rivastigmine  13.3 mg Transdermal Daily   Continuous Infusions: . cefTRIAXone (ROCEPHIN)  IV Stopped (11/26/16 2250)     LOS: 10 days    Time spent: 25    Alberteen Sam, MD [facility] Pager [phone]  If  7PM-7AM, please contact night-coverage [email or web address] Password [email or web address] 11/27/2016, 11:32 AM

## 2016-11-27 NOTE — Progress Notes (Addendum)
NURSING PROGRESS NOTE  ED RAYSON [id number] Transfer Data: 11/27/2016 8:15 PM Attending Provider: Alberteen Sam, * [id number], Pcp Not In Code Status: full  Allergies:  Patient has no known allergies. Past Medical History:   has a past medical history of Anemia (07/04/2012); Cardiac arrhythmia (07/04/2012); CKD (chronic kidney disease) stage 4, GFR 15-29 ml/min (HCC); CVA (cerebral vascular accident) (HCC) (07/04/2012); Dementia (07/04/2012); Diabetes mellitus type 2, diet-controlled (HCC) (07/04/2012); Hyperthyroidism; Unspecified constipation (07/04/2012); and Unspecified vitamin D deficiency (07/04/2012). Past Surgical History:   has a past surgical history that includes ABDOMINAL AORTOGRAM W/LOWER EXTREMITY (N/A, 10/28/2016) and PERIPHERAL VASCULAR INTERVENTION (Right, 10/28/2016). Social History:   reports that she has quit smoking. She has a 20.00 pack-year smoking history. She has never used smokeless tobacco. She reports that she does not drink alcohol or use drugs.  Sylvia Medina is a 81 y.o. female patient transferred from 52m>  Blood pressure (!) 114/40, pulse 71, temperature (!) 95.9 F (35.5 C), temperature source Axillary, resp. rate 14, height  (1.676 m), weight 116.2 kg (256 lb 1.6 oz), SpO2 100 %.  Cardiac Monitoring: Box # 36 in place. Cardiac monitor yields:normal sinus rhythm.  IV Fluids:  IV in place, occlusive dsg intact without redness IV push only, no IV fluids.   Skin: wounds to BLE  Will continue to evaluate and treat per MD orders.   Madelin Rear, MSN, RN, [employer]

## 2016-11-27 NOTE — Progress Notes (Signed)
Patient transferring to 5W. Report called to receiving nurse. All questions answered. Patient's daughter notified of room change. Dinner tray at bedside. Asked patient did she want tray transferred with her. Patient states "no". Staff offered patient something else to eat. Patient continued to state "no I am not hungry".

## 2016-11-27 NOTE — Consult Note (Signed)
Consultation Note Date: 11/27/2016   Patient Name: Sylvia Medina  DOB: [birth year]  MRN: [id number]  Age / Sex: 81 y.o., female  PCP: System, Pcp Not In Referring Physician: Edwin Dada, *  Reason for Consultation: Establishing goals of care and Psychosocial/spiritual support  HPI/Patient Profile: 81 y.o. female  with past medical history of PVD with recent stenting in right superficial femoral artery and right popliteal artery, DM2, CKD, dementia, remote CVA, and hyperthyroidism. She has also been struggling with lower extremity ulcerations related to her PVD. At baseline she is non-ambulatory. She now presents from her SNF with elevated sodium and painful ulcers on her left ankle and left calf. She was admitted on 11/17/2016. ABI showed moderate arterial occlusive disease on the right, and severe arterial occlusive disease on the left. Vascular had nothing further to offer at this point. Ortho offered a amputation (she would likely need AKA), with the caveat that she is at high risk for the wound not healing. The patient's daughter deferred on surgery at this point. Case was also discussed with ID, who recommended 4-6 weeks of antibiotics if she was stable. Unfortunately, the patient has continued to struggle with intermittent hypothermia and hypotension. Palliative care consulted to assist in clarifying goals of care.   Clinical Assessment and Goals of Care: Sylvia Medina is socially interactive but dementia limits her ability to meaningfully engage in a goals of care conversation. I met with her daughter, Docia Barrier, in conjunction with Dr. Loleta Books (primary team). Docia Barrier reports that she has HCPOA. In our meeting we discussed Sylvia Medina's health issues leading up to this hospitalization, what brought her into the hospital, and interventions to date for her acute issues. Docia Barrier has been in close contact with the primary team and had a good understanding  of the seriousness of her mother's health issues. We talked through a best case/worst case scenario and current options for care trajectory.   At this point, Docia Barrier is hoping that things continue to improve and her mother is stable enough to transfer back to [facility] with the plan for 4-6 weeks of IV antibiotics. In the event that she does not improve or declines, the option of pursuing Hospice as way to ensure comfort and dignity at the end of life was brought up. Docia Barrier acknowledged this option, though was clearly not yet in a place to consider it. Finally, I did discuss code status with her. Despite her mother having a DNR status in the past, Docia Barrier feels she should remain Full Code for now. She is willing to revisit this decision if her mother declines. As she was clearly overwhelmed by our conversation, I did not feel it appropriate to investigate her decision further.   Primary Decision Maker HCPOA, pt's daughter Verne Grain POA paperwork noted in the chart, however no specific HCPOA noted. Docia Barrier confirms she has the paperwork and will look for it.    SUMMARY OF RECOMMENDATIONS    Full code, continue current aggressive care  Palliative to gently follow and see how this plays out. We will revisit code status as situation allows  Code Status/Advance Care Planning:  Full code  Symptom Management:   Pt with good symptom management at present  Additional Recommendations (Limitations, Scope, Preferences):  Full Scope Treatment  Psycho-social/Spiritual:   Desire for further Chaplaincy support:no  Additional Recommendations: TBD  Prognosis:   Unable to determine  Discharge Planning: Back to SNF with Palliative      Primary Diagnoses: Present on Admission: .  Lower extremity ulceration ([place]) . Anemia of chronic disease . CKD (chronic kidney disease) stage 4, GFR 15-29 ml/min (HCC) . Dementia without behavioral disturbance . Essential hypertension, benign .  Hypernatremia . Hyperthyroidism   I have reviewed the medical record, interviewed the patient and family, and examined the patient. The following aspects are pertinent.  Past Medical History:  Diagnosis Date  . Anemia 07/04/2012  . Cardiac arrhythmia 07/04/2012  . CKD (chronic kidney disease) stage 4, GFR 15-29 ml/min (HCC)   . CVA (cerebral vascular accident) ([place]) 07/04/2012  . Dementia 07/04/2012  . Diabetes mellitus type 2, diet-controlled ([place]) 07/04/2012  . Hyperthyroidism   . Unspecified constipation 07/04/2012  . Unspecified vitamin D deficiency 07/04/2012   Social History   Social History  . Marital status: Widowed    Spouse name: N/A  . Number of children: N/A  . Years of education: N/A   Occupational History  . retired    Social History Main Topics  . Smoking status: Former Smoker    Packs/day: 0.50    Years: 40.00  . Smokeless tobacco: Never Used  . Alcohol use No  . Drug use: No  . Sexual activity: Not Currently   Other Topics Concern  . None   Social History Narrative  . None   Family History  Problem Relation Age of Onset  . Thyroid disease Neg Hx    Scheduled Meds: . aspirin EC  81 mg Oral Daily  . atorvastatin  10 mg Oral Daily  . Chlorhexidine Gluconate Cloth  6 each Topical Daily  . clopidogrel  75 mg Oral Q breakfast  . docusate sodium  100 mg Oral BID  . enoxaparin (LOVENOX) injection  30 mg Subcutaneous Q24H  . feeding supplement (GLUCERNA SHAKE)  237 mL Oral BID BM  . feeding supplement (PRO-STAT SUGAR FREE 64)  30 mL Oral BID  . ferrous sulfate  325 mg Oral Q breakfast  . hydrocortisone sod succinate (SOLU-CORTEF) inj  50 mg Intravenous Q12H  . metoprolol tartrate  12.5 mg Oral BID  . metroNIDAZOLE  500 mg Oral Q8H  . polyethylene glycol  17 g Oral Daily  . rivastigmine  13.3 mg Transdermal Daily   Continuous Infusions: . 0.9 % NaCl with KCl 40 mEq / L 125 mL/hr (11/27/16 0137)  . cefTRIAXone (ROCEPHIN)  IV Stopped (11/26/16 2250)    PRN Meds:.acetaminophen **OR** acetaminophen, ondansetron **OR** ondansetron (ZOFRAN) IV, sodium chloride flush No Known Allergies   Review of Systems  Unable to perform ROS  Physical Exam  Constitutional: No distress.  Chronically ill appearing older woman  HENT:  Head: Normocephalic and atraumatic.  Mouth/Throat: Oropharynx is clear and moist. Abnormal dentition. No oropharyngeal exudate.  Eyes: EOM are normal.  Neck: Normal range of motion. Neck supple.  Cardiovascular: Regular rhythm.  Bradycardia present.   Pulmonary/Chest: Effort normal. No respiratory distress. She has decreased breath sounds in the right lower field and the left lower field.  Abdominal: Soft. Bowel sounds are normal.  Musculoskeletal:  Unable to visualize wounds. Left leg extremely tender to light touch.  Neurological: She is alert.  Oriented to self only. Socially conversational.  Skin: Skin is warm and dry.  Psychiatric: Her speech is delayed and tangential. She is slowed. Cognition and memory are impaired.  Calm. Flat affect though would occasionally smile.    Vital Signs: BP (!) 99/43 (BP Location: Left Wrist)   Pulse (!) 56   Temp 98.4 F (36.9 C) (Oral)   Resp 14  Ht 5' 6"  (1.676 m)   Wt 116.2 kg (256 lb 1.6 oz)   SpO2 100%   BMI 41.34 kg/m  Pain Assessment: No/denies pain POSS *See Group Information*: 1-Acceptable,Awake and alert Pain Score: 5    SpO2: SpO2: 100 % O2 Device:SpO2: 100 % O2 Flow Rate: .   IO: Intake/output summary:  Intake/Output Summary (Last 24 hours) at 11/27/16 0825 Last data filed at 11/27/16 0600  Gross per 24 hour  Intake           1612.5 ml  Output              380 ml  Net           1232.5 ml    LBM: Last BM Date: 11/26/16 Baseline Weight: Weight: 114.3 kg (252 lb) Most recent weight: Weight: 116.2 kg (256 lb 1.6 oz)     Palliative Assessment/Data: PPS 30%    Time In: 1100 Time Out: 1120 Time Total: 50 minutes Greater than 50%  of this time  was spent counseling and coordinating care related to the above assessment and plan.  Signed by: Charlynn Court, NP Palliative Medicine Team Pager # [phone] (M-F 7a-5p) Team Phone # [phone] (Nights/Weekends)

## 2016-11-28 LAB — CBC
HCT: 24.2 % — ABNORMAL LOW (ref 36.0–46.0)
HEMOGLOBIN: 7.5 g/dL — AB (ref 12.0–15.0)
MCH: 24.8 pg — ABNORMAL LOW (ref 26.0–34.0)
MCHC: 31 g/dL (ref 30.0–36.0)
MCV: 80.1 fL (ref 78.0–100.0)
PLATELETS: 485 10*3/uL — AB (ref 150–400)
RBC: 3.02 MIL/uL — AB (ref 3.87–5.11)
RDW: 19.8 % — ABNORMAL HIGH (ref 11.5–15.5)
WBC: 11.7 10*3/uL — AB (ref 4.0–10.5)

## 2016-11-28 LAB — BASIC METABOLIC PANEL
ANION GAP: 7 (ref 5–15)
BUN: 55 mg/dL — ABNORMAL HIGH (ref 6–20)
CALCIUM: 8.4 mg/dL — AB (ref 8.9–10.3)
CO2: 15 mmol/L — ABNORMAL LOW (ref 22–32)
CREATININE: 2.41 mg/dL — AB (ref 0.44–1.00)
Chloride: 123 mmol/L — ABNORMAL HIGH (ref 101–111)
GFR calc non Af Amer: 18 mL/min — ABNORMAL LOW (ref >60)
GFR, EST AFRICAN AMERICAN: 21 mL/min — AB (ref >60)
Glucose, Bld: 134 mg/dL — ABNORMAL HIGH (ref 65–99)
Potassium: 4.5 mmol/L (ref 3.5–5.1)
SODIUM: 145 mmol/L (ref 135–145)

## 2016-11-28 MED ORDER — SODIUM CHLORIDE 0.45 % IV SOLN
INTRAVENOUS | Status: DC
  Administered 2016-11-28: 17:00:00 via INTRAVENOUS

## 2016-11-28 NOTE — Care Management Important Message (Signed)
Important Message  Patient Details  Name: Sylvia Medina MRN: [id number] Date of Birth: 31-Jan-1936   Medicare Important Message Given:  Yes    Hazely Sealey Abena 11/28/2016, 10:59 AM

## 2016-11-28 NOTE — Progress Notes (Signed)
Informed Dr. Maryfrances Bunnell of 2 second pause. Pt asymptomatic, resting quietly at this time. Denies pain and/ or discomfort.

## 2016-11-28 NOTE — Progress Notes (Signed)
Palliative Care  I briefly stopped by to check on Mrs. Lisby. She was awake and socially conversational. She had just finished the majority of her breakfast and reported well controlled pain and no acute concerns or complaints. Low temperature noted yesterday evening (36.3 at 1800), but she otherwise appears stable.   Plan Palliative will continue to follow peripherally and will re-engage as needed. Please do not hesitate to call with any concerns or questions. Our team number is [phone].    Murrell Converse AGNP-C Palliative Care  No charge note.

## 2016-11-28 NOTE — Progress Notes (Addendum)
PROGRESS NOTE    Sylvia Medina  ZOX:[id number] DOB: Jun 27, 1935 DOA: 11/17/2016 PCP: System, Pcp Not In      Brief Narrative:  81 y.o.femalewith mild dementia, PVD with recent stent pplacement in right superficial femoral artery and right popliteal artery; hyperthyroidism; DM; remote CVA; and CKD presenting after being sent from her SNF with elevated sodium and an infected left leg.   Shortly after admission patient became hypothermic requiring stepdown transfer. Started on broad-spectrum IV antibiotics for her lower extremity ulcers. Orthopedic surgery was consulted and have recommended amputation, however family is deferring this for now. Vascular surgery was consulted over the phone and stated that they have nothing else to offer at this point.  Was discussed with ID who said 4-6 weeks ceftriaxone/Flagyl would be reasonable alternative, if stable.  The patient however, remained hypotensive and hypothermic requiring warming blanket and IV fluids.  She was evaluated by Palliative care.     Assessment & Plan:  Principal Problem:   Lower extremity ulceration (HCC) Active Problems:   Diabetes mellitus type 2, diet-controlled (HCC)   Dementia without behavioral disturbance   Essential hypertension, benign   Anemia of chronic disease   Hypernatremia   CKD (chronic kidney disease) stage 4, GFR 15-29 ml/min (HCC)   Hyperthyroidism   Palliative care encounter   Goals of care, counseling/discussion  Bilateral lower extremity ulcers In the setting of diabetes and moderate-to-severe PVD bilaterally.  Dr. Lajoyce Corners has offered left trans-tib amputation, but family have deferred understandably.  This would be high risk to dehisce.  Vascular surgery have nothing to offer.  Patient has dementia and is non-ambulatory.  The patient's wound appears black and necrotic.  Per ID by phone, would be reasonable to place PICC and continue ceftriaxone/Flagyl for 4-6 weeks at SNF, although without healing  of her ulcers/skin breakdown, tampening down the infection is likely only temporary.  It is not clear that there are any curative options for [name].  During [facility] meeting yesterday, we discussed our hope that patient would be able to continue to improve on the floor over the next 1-2 days, and that we would begin preparations for return to [facility] with PICC and long-term IV antibiotics.  If she deteriorates again (especially as steroids and fluids are tapered), we have discussed that this likely is the body's way of declaring that antibiotics alone will not be curative, and that Hospice should be considered.  Daughter reluctant to consider Hospice, but overall seems to understand best and worst case scenarios.  Transferred to Med surg floor yesterday. -Continue CTX/flagyl -Consult to Palliative Care -Oxycodone for pain   Chronic kidney disease stage IV Creatinine trending up to 2.4 today -Start IVF -Repeat BMP tomorrow -Check UA and FeNA  Hypothermia TSH and cortisol normal.  Presumed from infected leg ulcers.  Improved today.   -Stopped steroids today  Peripheral vascular disease Not intervention candidate -Continue statin, aspirin, Plavix  Hypertension BP improved -Hold HCTZ -Continue metoprolol dose lowered  Dementia -Continue Rivastigmine  Hyperlipidemia -Continue statin  Hyperthyroidism TSH normal. Has outpatient follow up. -Hold Tapazole  Buttock ulcer WOC have evaluated and believe this is not a pressure ulcer.  Hypernatremia Normalized     DVT prophylaxis: Lovenox Code Status: FULL code Family Communication: Family meeting with palliative care yesterday Disposition Plan: Possibly place PICC and transfer back to SNF in 1-2 days if stable  Consultants:   Vascular Surgery, Orthopedics, Palliative care  Procedures:   None  Antimicrobials:   Vancomycin 10/4 >>  10/8  Ceftriaxone 10/4 >>  Metronidazole 10/4 >>    Subjective: Feels  better today again.  Looks good.  Very weak.  Pain in feet with movement.   Objective: Vitals:   11/27/16 1144 11/27/16 1800 11/27/16 2025 11/28/16 0547  BP:   124/62 (!) 121/44  Pulse:   60 (!) 54  Resp:   18 16  Temp: (!) 97.4 F (36.3 C) (!) 95.9 F (35.5 C) (!) 97.5 F (36.4 C)   TempSrc: Oral Axillary Oral   SpO2:   100% 100%  Weight:      Height:       No intake or output data in the 24 hours ending 11/28/16 1148 Filed Weights   11/17/16 2343 11/19/16 0549  Weight: 114.3 kg (252 lb) 116.2 kg (256 lb 1.6 oz)    Examination:  General exam: Alert, watching TV. Not oriented to place, situation.  Respiratory system: Clear to auscultation. Respiratory effort normal. Cardiovascular system: S1 & S2 heard, RRR. Soft SEM.  No JVD.  No pedal edema. Gastrointestinal system: Abdomen is nondistended, soft and nontender.  Central nervous system: Not oriented to place, knows she is in [place].   Extremities: The bilateral legs have necrotic open ulcers with slight surrounding erythema Psychiatry: Judgement and insight appear poor due to dementia. Mood & affect blunted.     Data Reviewed: I have personally reviewed following labs and imaging studies  CBC:  Recent Labs Lab 11/23/16 0514 11/24/16 0831 11/25/16 0807 11/27/16 1419 11/28/16 0356  WBC 14.7* 8.9 12.2* 12.5* 11.7*  HGB 8.4* 7.4* 8.0* 7.8* 7.5*  HCT 24.6* 23.0* 24.9* 24.5* 24.2*  MCV 77.1* 78.2 78.3 80.3 80.1  PLT 330 411* 454* 536* 485*   Basic Metabolic Panel:  Recent Labs Lab 11/23/16 0514 11/24/16 0710 11/24/16 0831 11/25/16 0807 11/27/16 1419 11/28/16 0356  NA 135  --  137 140 144 145  K 3.5  --  3.0* 3.1* 4.5 4.5  CL 106  --  112* 114* 125* 123*  CO2 17*  --  20* 17* 15* 15*  GLUCOSE 89  --  94 148* 128* 134*  BUN 43*  --  38* 45* 50* 55*  CREATININE 2.20* 2.12* 2.10* 2.28* 2.37* 2.41*  CALCIUM 8.5*  --  8.4* 8.4* 8.2* 8.4*   GFR: Estimated Creatinine Clearance: 24.5 mL/min (A) (by C-G  formula based on SCr of 2.41 mg/dL (H)). Liver Function Tests: No results for input(s): AST, ALT, ALKPHOS, BILITOT, PROT, ALBUMIN in the last 168 hours. No results for input(s): LIPASE, AMYLASE in the last 168 hours. No results for input(s): AMMONIA in the last 168 hours. Coagulation Profile: No results for input(s): INR, PROTIME in the last 168 hours. Cardiac Enzymes: No results for input(s): CKTOTAL, CKMB, CKMBINDEX, TROPONINI in the last 168 hours. BNP (last 3 results) No results for input(s): PROBNP in the last 8760 hours. HbA1C: No results for input(s): HGBA1C in the last 72 hours. CBG: No results for input(s): GLUCAP in the last 168 hours. Lipid Profile: No results for input(s): CHOL, HDL, LDLCALC, TRIG, CHOLHDL, LDLDIRECT in the last 72 hours. Thyroid Function Tests: No results for input(s): TSH, T4TOTAL, FREET4, T3FREE, THYROIDAB in the last 72 hours. Anemia Panel: No results for input(s): VITAMINB12, FOLATE, FERRITIN, TIBC, IRON, RETICCTPCT in the last 72 hours. Urine analysis:    Component Value Date/Time   COLORURINE YELLOW 11/17/2016 1808   APPEARANCEUR HAZY (A) 11/17/2016 1808   LABSPEC 1.012 11/17/2016 1808   PHURINE 6.0 11/17/2016 1808  GLUCOSEU NEGATIVE 11/17/2016 1808   HGBUR MODERATE (A) 11/17/2016 1808   BILIRUBINUR NEGATIVE 11/17/2016 1808   KETONESUR NEGATIVE 11/17/2016 1808   PROTEINUR 30 (A) 11/17/2016 1808   NITRITE POSITIVE (A) 11/17/2016 1808   LEUKOCYTESUR LARGE (A) 11/17/2016 1808   Sepsis Labs: (procalcitonin:4,lacticidven:4)  ) Recent Results (from the past 240 hour(s))  MRSA PCR Screening     Status: None   Collection Time: 11/19/16  6:02 AM  Result Value Ref Range Status   MRSA by PCR NEGATIVE NEGATIVE Final    Comment:        The GeneXpert MRSA Assay (FDA approved for NASAL specimens only), is one component of a comprehensive MRSA colonization surveillance program. It is not intended to diagnose MRSA infection nor to  guide or monitor treatment for MRSA infections.          Radiology Studies: No results found.      Scheduled Meds: . aspirin EC  81 mg Oral Daily  . atorvastatin  10 mg Oral Daily  . clopidogrel  75 mg Oral Q breakfast  . docusate sodium  100 mg Oral BID  . enoxaparin (LOVENOX) injection  30 mg Subcutaneous Q24H  . feeding supplement (GLUCERNA SHAKE)  237 mL Oral BID BM  . feeding supplement (PRO-STAT SUGAR FREE 64)  30 mL Oral BID  . ferrous sulfate  325 mg Oral Q breakfast  . hydrocortisone sod succinate (SOLU-CORTEF) inj  50 mg Intravenous Q12H  . metoprolol tartrate  12.5 mg Oral BID  . metroNIDAZOLE  500 mg Oral Q8H  . polyethylene glycol  17 g Oral Daily  . rivastigmine  13.3 mg Transdermal Daily   Continuous Infusions: . cefTRIAXone (ROCEPHIN)  IV Stopped (11/28/16 0056)     LOS: 11 days    Time spent: 25    Alberteen Sam, MD [facility] Pager [phone]  If 7PM-7AM, please contact night-coverage [email or web address] Password [email or web address] 11/28/2016, 11:48 AM

## 2016-11-29 LAB — CBC
HEMATOCRIT: 22.8 % — AB (ref 36.0–46.0)
Hemoglobin: 7 g/dL — ABNORMAL LOW (ref 12.0–15.0)
MCH: 24.8 pg — ABNORMAL LOW (ref 26.0–34.0)
MCHC: 30.7 g/dL (ref 30.0–36.0)
MCV: 80.9 fL (ref 78.0–100.0)
Platelets: 396 10*3/uL (ref 150–400)
RBC: 2.82 MIL/uL — ABNORMAL LOW (ref 3.87–5.11)
RDW: 20.3 % — AB (ref 11.5–15.5)
WBC: 11.1 10*3/uL — AB (ref 4.0–10.5)

## 2016-11-29 LAB — BASIC METABOLIC PANEL
Anion gap: 7 (ref 5–15)
BUN: 46 mg/dL — AB (ref 6–20)
CHLORIDE: 127 mmol/L — AB (ref 101–111)
CO2: 12 mmol/L — AB (ref 22–32)
Calcium: 7 mg/dL — ABNORMAL LOW (ref 8.9–10.3)
Creatinine, Ser: 1.98 mg/dL — ABNORMAL HIGH (ref 0.44–1.00)
GFR calc Af Amer: 26 mL/min — ABNORMAL LOW (ref >60)
GFR calc non Af Amer: 23 mL/min — ABNORMAL LOW (ref >60)
GLUCOSE: 77 mg/dL (ref 65–99)
POTASSIUM: 3.4 mmol/L — AB (ref 3.5–5.1)
Sodium: 146 mmol/L — ABNORMAL HIGH (ref 135–145)

## 2016-11-29 MED ORDER — DEXTROSE 5 % IV SOLN
INTRAVENOUS | Status: DC
  Administered 2016-11-29 – 2016-11-30 (×2): via INTRAVENOUS

## 2016-11-29 MED ORDER — ADULT MULTIVITAMIN W/MINERALS CH
1.0000 | ORAL_TABLET | Freq: Every day | ORAL | Status: DC
  Administered 2016-11-29 – 2016-12-05 (×7): 1 via ORAL
  Filled 2016-11-29 (×7): qty 1

## 2016-11-29 NOTE — Progress Notes (Signed)
Nutrition Follow-up  DOCUMENTATION CODES:   Morbid obesity  INTERVENTION:   -Continue Glucerna Shake po BID, each supplement provides 220 kcal and 10 grams of protein -Continue 30 ml Prostat BID, each supplement provides 100 kcals and 15 grams protein -MVI daily  NUTRITION DIAGNOSIS:   Increased nutrient needs (for protein) related to wound healing as evidenced by estimated needs.  Ongoing  GOAL:   Patient will meet greater than or equal to 90% of their needs  Progressing  MONITOR:   PO intake, Supplement acceptance, Weight trends, Labs, Skin  REASON FOR ASSESSMENT:   Consult Wound healing  ASSESSMENT:   81 y.o. female with medical history significant of hypothyroidism but currently being treated forPVD with recent stent pplacement in right superficial femoral artery and right popliteal artery;  hyperthyroidism; DM; dementia; remote CVA; and CKD presenting after being sent from her SNF with elevated sodium and an infected left leg.  She was given a shot today and had a wound culture.  They just noticed the infection today, "they said it wasn't there yesterday."  She has had a number of ulcers - R second toe ulcer since last year, treated off and on; wound on the back of her left leg, uncertain duration; left leg wound, her daughter never noticed it, so maybe present for a couple of weeks.  No fevers.  She has been hoarse at times, had an appointment last week with endocrinology for hyperthyroidism and was supposed to start medication for this.  Palliative care team following; plan to continue full code and aggressive care.   Pt continues to refuse amputations. Spoke with pt, who reports her appetite continues to improve. Noted pt consumed 75% of breakfast tray. She reports taking supplements (Glucerna and Prostat), which is consistent with MAR. Discussed with pt continued importance of good meal and supplement intake to promote healing.   Labs reviewed: Na: 146, K: 3.4.    Diet Order:  Diet Carb Modified Fluid consistency: Thin; Room service appropriate? Yes  Skin:  Wound (see comment) (FT buttocks, BLE wounds, UN rt/lt ankle/ lt calf)  Last BM:  11/28/16  Height:   Ht Readings from Last 1 Encounters:  11/19/16  (1.676 m)    Weight:   Wt Readings from Last 1 Encounters:  11/19/16 256 lb 1.6 oz (116.2 kg)    Ideal Body Weight:  59.09 kg  BMI:  Body mass index is 41.34 kg/m.  Estimated Nutritional Needs:   Kcal:  1800-2000  Protein:  115-130 gm  Fluid:  1.8-2 L  EDUCATION NEEDS:   No education needs identified at this time  Katheleen Stella A. Mayford Knife, RD, LDN, CDE Pager: [phone] After hours Pager: [phone]

## 2016-11-29 NOTE — Progress Notes (Signed)
PROGRESS NOTE    Sylvia Medina  EAV:[id number] DOB: [birth year] DOA: 11/17/2016 PCP: System, Pcp Not In      Brief Narrative:  81 y.o.femalewith mild dementia, PVD with recent stent pplacement in right superficial femoral artery and right popliteal artery; hyperthyroidism; DM; remote CVA; and CKD presenting after being sent from her SNF with elevated sodium and an infected left leg.   Shortly after admission patient became hypothermic requiring stepdown transfer. Started on broad-spectrum IV antibiotics for her lower extremity ulcers. Orthopedic surgery was consulted and have recommended amputation, however family is deferring this for now. Vascular surgery was consulted over the phone and stated that they have nothing else to offer at this point.  Was discussed with ID who said 4-6 weeks ceftriaxone/Flagyl would be reasonable alternative, if stable.  The patient however, remained hypotensive and hypothermic requiring warming blanket and IV fluids.  She was evaluated by Palliative care.     Assessment & Plan:  Principal Problem:   Lower extremity ulceration (HCC) Active Problems:   Diabetes mellitus type 2, diet-controlled (HCC)   Dementia without behavioral disturbance   Essential hypertension, benign   Anemia of chronic disease   Hypernatremia   CKD (chronic kidney disease) stage 4, GFR 15-29 ml/min (HCC)   Hyperthyroidism   Palliative care encounter   Goals of care, counseling/discussion  Bilateral lower extremity ulcers In the setting of diabetes and moderate-to-severe PVD bilaterally.  Dr. Lajoyce Corners has offered left trans-tib amputation, but family have deferred understandably.  This would be high risk to dehisce.  Vascular surgery have nothing to offer.  Patient has dementia and is non-ambulatory.  The patient's wound appears black and necrotic.  Per ID by phone, would be reasonable to place PICC and continue ceftriaxone/Flagyl for 4-6 weeks at SNF, although without healing  of her ulcers/skin breakdown, tampening down the infection is likely only temporary.  It is not clear that there are any curative options for Sylvia Medina.  During famly meeting the other day, we discussed our hope that patient would be able to continue to improve on the floor over the next 1-2 days, and that we would begin preparations for return to [facility] with PICC and long-term IV antibiotics.  If she deteriorates again (especially as steroids and fluids are tapered), we have discussed that this likely is the body's way of declaring that antibiotics alone will not be curative, and that Hospice should be considered.  Daughter reluctant to consider Hospice, but overall seems to understand best and worst case scenarios.  Transferred to Med surg floor two days ago, yesterday renal function worsening, fluids restarted.  Very poor PO intake per daughter. Today, sodium up so I will change to D5.  She is very puffy.  She is not taking much in.  I spoke with daughter today and explained this is possibly her body's way of saying it doesn't want to go on. -Continue CTX/flagyl -Consult to Palliative Care -Oxycodone for pain   Chronic kidney disease stage IV Creatinine improved with fluids -Change IVF to D5 -Repeat BMP tomorrow  Hypothermia TSH and cortisol normal.  Presumed from infected leg ulcers.  Stable now off steroids  Peripheral vascular disease Not intervention candidate -Continue statin, aspirin, Plavix  Hypertension BP improved -HCTZ was stopped -Continue metoprolol dose lowered  Dementia -Continue Rivastigmine  Hyperlipidemia -Continue statin  Hyperthyroidism TSH normal. Has outpatient follow up. -Hold Tapazole  Buttock ulcer WOC have evaluated and believe this is not a pressure ulcer.  Hypernatremia Normalized  DVT prophylaxis: Lovenox Code Status: FULL code, should be readdressed at discharge Family Communication: Family meeting with palliative care  yesterday Disposition Plan: Possibly place PICC and transfer back to SNF in 1-2 days if stable sodium  Consultants:   Vascular Surgery, Orthopedics, Palliative care  Procedures:   None  Antimicrobials:   Vancomycin 10/4 >> 10/8  Ceftriaxone 10/4 >>  Metronidazole 10/4 >>    Subjective: Watching TV.  Rest pain in feet.  No new fever, chills.  She has no complaints at rest.  Objective: Vitals:   11/28/16 0547 11/28/16 2221 11/29/16 0040 11/29/16 0437  BP: (!) 121/44 (!) 112/31  (!) 146/54  Pulse: (!) 54 65  62  Resp: Temp:  (!) 94.6 F (34.8 C) (!) 96.3 F (35.7 C) 97.9 F (36.6 C)  TempSrc:  Oral Oral Axillary  SpO2: 100% 100%  100%  Weight:      Height:        Intake/Output Summary (Last 24 hours) at 11/29/16 0818 Last data filed at 11/29/16 0454  Gross per 24 hour  Intake              250 ml  Output              400 ml  Net             -150 ml   Filed Weights   11/17/16 2343 11/19/16 0549  Weight: 114.3 kg (252 lb) 116.2 kg (256 lb 1.6 oz)    Examination:  General exam: Alert, watching TV. Not oriented to place, situation. Smiles. Respiratory system: Clear to auscultation. Respiratory effort normal. Cardiovascular system: S1 & S2 heard, RRR. Soft SEM.  No JVD.  Edema is starting to be noticeably in arms, legs.  Nonpitting. Gastrointestinal system: Abdomen is nondistended, soft and nontender.  Central nervous system: Not oriented to place, knows she is in [place].   Extremities: The bilateral legs have necrotic open ulcers with slight surrounding erythema, which is essentially unchanged. Psychiatry: Judgement and insight appear poor due to dementia. Mood & affect blunted.     Data Reviewed: I have personally reviewed following labs and imaging studies  CBC:  Recent Labs Lab 11/24/16 0831 11/25/16 0807 11/27/16 1419 11/28/16 0356 11/29/16 0525  WBC 8.9 12.2* 12.5* 11.7* 11.1*  HGB 7.4* 8.0* 7.8* 7.5* 7.0*  HCT 23.0* 24.9*  24.5* 24.2* 22.8*  MCV 78.2 78.3 80.3 80.1 80.9  PLT 411* 454* 536* 485* 396   Basic Metabolic Panel:  Recent Labs Lab 11/24/16 0831 11/25/16 0807 11/27/16 1419 11/28/16 0356 11/29/16 0525  NA 137 140 144 145 146*  K 3.0* 3.1* 4.5 4.5 3.4*  CL 112* 114* 125* 123* 127*  CO2 20* 17* 15* 15* 12*  GLUCOSE 94 148* 128* 134* 77  BUN 38* 45* 50* 55* 46*  CREATININE 2.10* 2.28* 2.37* 2.41* 1.98*  CALCIUM 8.4* 8.4* 8.2* 8.4* 7.0*   GFR: Estimated Creatinine Clearance: 29.9 mL/min (A) (by C-G formula based on SCr of 1.98 mg/dL (H)). Liver Function Tests: No results for input(s): AST, ALT, ALKPHOS, BILITOT, PROT, ALBUMIN in the last 168 hours. No results for input(s): LIPASE, AMYLASE in the last 168 hours. No results for input(s): AMMONIA in the last 168 hours. Coagulation Profile: No results for input(s): INR, PROTIME in the last 168 hours. Cardiac Enzymes: No results for input(s): CKTOTAL, CKMB, CKMBINDEX, TROPONINI in the last 168 hours. BNP (last 3 results) No results for input(s): PROBNP in the last  8760 hours. HbA1C: No results for input(s): HGBA1C in the last 72 hours. CBG: No results for input(s): GLUCAP in the last 168 hours. Lipid Profile: No results for input(s): CHOL, HDL, LDLCALC, TRIG, CHOLHDL, LDLDIRECT in the last 72 hours. Thyroid Function Tests: No results for input(s): TSH, T4TOTAL, FREET4, T3FREE, THYROIDAB in the last 72 hours. Anemia Panel: No results for input(s): VITAMINB12, FOLATE, FERRITIN, TIBC, IRON, RETICCTPCT in the last 72 hours. Urine analysis:    Component Value Date/Time   COLORURINE YELLOW 11/17/2016 1808   APPEARANCEUR HAZY (A) 11/17/2016 1808   LABSPEC 1.012 11/17/2016 1808   PHURINE 6.0 11/17/2016 1808   GLUCOSEU NEGATIVE 11/17/2016 1808   HGBUR MODERATE (A) 11/17/2016 1808   BILIRUBINUR NEGATIVE 11/17/2016 1808   KETONESUR NEGATIVE 11/17/2016 1808   PROTEINUR 30 (A) 11/17/2016 1808   NITRITE POSITIVE (A) 11/17/2016 1808    LEUKOCYTESUR LARGE (A) 11/17/2016 1808   Sepsis Labs: (procalcitonin:4,lacticidven:4)  ) No results found for this or any previous visit (from the past 240 hour(s)).       Radiology Studies: No results found.      Scheduled Meds: . aspirin EC  81 mg Oral Daily  . atorvastatin  10 mg Oral Daily  . clopidogrel  75 mg Oral Q breakfast  . docusate sodium  100 mg Oral BID  . enoxaparin (LOVENOX) injection  30 mg Subcutaneous Q24H  . feeding supplement (GLUCERNA SHAKE)  237 mL Oral BID BM  . feeding supplement (PRO-STAT SUGAR FREE 64)  30 mL Oral BID  . ferrous sulfate  325 mg Oral Q breakfast  . metoprolol tartrate  12.5 mg Oral BID  . metroNIDAZOLE  500 mg Oral Q8H  . polyethylene glycol  17 g Oral Daily  . rivastigmine  13.3 mg Transdermal Daily   Continuous Infusions: . sodium chloride 75 mL/hr at 11/28/16 1725  . cefTRIAXone (ROCEPHIN)  IV Stopped (11/28/16 2154)     LOS: 12 days    Time spent: 20    Alberteen Sam, MD [facility] Pager [phone]  If 7PM-7AM, please contact night-coverage [email or web address] Password [email or web address] 11/29/2016, 8:18 AM

## 2016-11-29 NOTE — Progress Notes (Signed)
rt heel wound dressing changed

## 2016-11-30 DIAGNOSIS — D638 Anemia in other chronic diseases classified elsewhere: Secondary | ICD-10-CM

## 2016-11-30 DIAGNOSIS — F0390 Unspecified dementia without behavioral disturbance: Secondary | ICD-10-CM

## 2016-11-30 DIAGNOSIS — N184 Chronic kidney disease, stage 4 (severe): Secondary | ICD-10-CM

## 2016-11-30 LAB — PREPARE RBC (CROSSMATCH)

## 2016-11-30 LAB — ABO/RH: ABO/RH(D): A POS

## 2016-11-30 MED ORDER — SODIUM CHLORIDE 0.9 % IV SOLN
Freq: Once | INTRAVENOUS | Status: AC
  Administered 2016-12-01: 22:00:00 via INTRAVENOUS

## 2016-11-30 NOTE — Progress Notes (Addendum)
Tried to get pt temp. From different sites, but to no avail. Nurse tech took it rectally and it came out to be 93.3. Gave warm blankets for pt to get warm. Can not transfuse blood at this time due to low temp. Blood returned to blood bank. PA notified about these changes.

## 2016-11-30 NOTE — Progress Notes (Addendum)
PROGRESS NOTE [facility]   [name]   ZOX:[id number] DOB: [birth year]  DOA: 11/17/2016 PCP: System, Pcp Not In   Brief Narrative:  Sylvia Medina 81 year old female with history of dementia, PVD, hyperthyroidism, diabetes mellitus, chronic kidney disease and history of CVA. Presented to the emergency department after being sent from SNF with elevated sodium and infected left leg. Shortly after admission patient became hypothermic and was transferred to stepdown unit. Patient was started on IV broad-spectrum antibiotics for her lower extremity ulcers. Orthopedic surgery was consulted and recommended amputation, however family is deferred in this for now. Vascular surgery was also consulted which did have no recommendations at this point. Case was discussed with infectious disease who recommended 4-6 week of ceftriaxone/Flagyl. Patient was evaluated by palliative care team but family will want to pursue aggressive treatment for now. Patient now anemic with hemoglobin 7.0 she will be transfused 1 unit of PRBCs.  Subjective: Patient seen and examined, she reports doing pretty well, has no complaints today. Denies chest pain, shortness of breath and palpitation. Tolerating diet well.  Assessment & Plan: Bilateral lower extremity ulcers In setting of diabetes and moderate to severe PVD bilateral. Orthopedic surgery consulted and recommended amputation. Family opted to defer this option for now. Vascular surgery with no recommendations. ID recommending ceftriaxone/Friday for 4-6 week at SNF. This alternative is likely to be temporary as patient has dementia is not ambulatory and with unhealed ulcer/skin breakdown. Patient was hypothermic which this has now resolved, will discuss with family to get PICC line and continue treatment at SNF. Continue IV antibiotics for now. A medication as needed.  Chronic kidney disease stage IV Progressive, started on IV fluid with improvement in  creatinine, will IVF for now as patient will receive blood  Will continue to monitor  Hypothermia - resolved  Peripheral vascular disease Not candidate for surgical intervention Continue statin, aspirin and Plavix  Hypertension blood pressure stable Continue current regimen  Anemia of chronic disease Likely due to chronic kidney disease Will transfuse 1 unit of PRBCs Will check anemia panel Check CBC in the morning Hold Lovenox   DVT prophylaxis: SCDs Code Status: Full code Family Communication: None at bedside Disposition Plan: SNF in the next 24-48 hours   Consultants:   Orthopedic surgery  Procedures:   None  Antimicrobials: Anti-infectives    Start     Dose/Rate Route Frequency Ordered Stop   11/19/16 2200  vancomycin (VANCOCIN) 1,500 mg in sodium chloride 0.9 % 500 mL IVPB  Status:  Discontinued     1,500 mg 250 mL/hr over 120 Minutes Intravenous Every 48 hours 11/18/16 0000 11/21/16 1214   11/18/16 0000  vancomycin (VANCOCIN) 2,000 mg in sodium chloride 0.9 % 500 mL IVPB     2,000 mg 250 mL/hr over 120 Minutes Intravenous  Once 11/17/16 2354 11/18/16 0334   11/17/16 2345  vancomycin (VANCOCIN) IVPB 1000 mg/200 mL premix  Status:  Discontinued     1,000 mg 200 mL/hr over 60 Minutes Intravenous  Once 11/17/16 2336 11/17/16 2354   11/17/16 2245  metroNIDAZOLE (FLAGYL) tablet 500 mg     500 mg Oral Every 8 hours 11/17/16 2232     11/17/16 2231  cefTRIAXone (ROCEPHIN) 2 g in dextrose 5 % 50 mL IVPB     2 g 100 mL/hr over 30 Minutes Intravenous Every 24 hours 11/17/16 2232     11/17/16 1830  vancomycin (VANCOCIN) 2,000 mg in sodium chloride 0.9 % 500 mL IVPB  Status:  Discontinued     2,000 mg 250 mL/hr over 120 Minutes Intravenous  Once 11/17/16 1820 11/17/16 1824   11/17/16 1830  piperacillin-tazobactam (ZOSYN) IVPB 3.375 g     3.375 g 100 mL/hr over 30 Minutes Intravenous  Once 11/17/16 1820 11/17/16 1931       Objective: Vitals:   11/29/16 2252  11/30/16 0543 11/30/16 0915 11/30/16 1300  BP: (!) 142/66 (!) 111/41 (!) 129/45 (!) 125/36  Pulse: 66 (!) 58 66 61  Resp: 18 18 18 16   Temp: 98.2 F (36.8 C) 98.1 F (36.7 C) 98.5 F (36.9 C) 98.3 F (36.8 C)  TempSrc:   Oral Oral  SpO2: 100% 100% 100% 100%  Weight:      Height:        Intake/Output Summary (Last 24 hours) at 11/30/16 1542 Last data filed at 11/30/16 0950  Gross per 24 hour  Intake          1468.75 ml  Output                0 ml  Net          1468.75 ml   Filed Weights   11/17/16 2343 11/19/16 0549  Weight: 114.3 kg (252 lb) 116.2 kg (256 lb 1.6 oz)    Examination:  General exam: NAD  HEENT: AC/AT, PERRLA Respiratory system: Clear to auscultation. No wheezes,crackle or rhonchi, normal respiratory effort  Cardiovascular system: S1 & S2 heard, RRR. No JVD, murmurs, rubs or gallops Gastrointestinal system: Abdomen is nondistended, soft and nontender.  Central nervous system: Oriented to person.  Extremities: Bilateral legs have necrotic open ulcers with slight surrounding erythema   Skin: No rashes, lesions or ulcers Psychiatry: Judgement and insight poor due to dementia. Mood & affect flat.    Data Reviewed: I have personally reviewed following labs and imaging studies  CBC:  Recent Labs Lab 11/24/16 0831 11/25/16 0807 11/27/16 1419 11/28/16 0356 11/29/16 0525  WBC 8.9 12.2* 12.5* 11.7* 11.1*  HGB 7.4* 8.0* 7.8* 7.5* 7.0*  HCT 23.0* 24.9* 24.5* 24.2* 22.8*  MCV 78.2 78.3 80.3 80.1 80.9  PLT 411* 454* 536* 485* 396   Basic Metabolic Panel:  Recent Labs Lab 11/24/16 0831 11/25/16 0807 11/27/16 1419 11/28/16 0356 11/29/16 0525  NA 137 140 144 145 146*  K 3.0* 3.1* 4.5 4.5 3.4*  CL 112* 114* 125* 123* 127*  CO2 20* 17* 15* 15* 12*  GLUCOSE 94 148* 128* 134* 77  BUN 38* 45* 50* 55* 46*  CREATININE 2.10* 2.28* 2.37* 2.41* 1.98*  CALCIUM 8.4* 8.4* 8.2* 8.4* 7.0*   GFR: Estimated Creatinine Clearance: 29.9 mL/min (A) (by C-G formula  based on SCr of 1.98 mg/dL (H)). Liver Function Tests: No results for input(s): AST, ALT, ALKPHOS, BILITOT, PROT, ALBUMIN in the last 168 hours. No results for input(s): LIPASE, AMYLASE in the last 168 hours. No results for input(s): AMMONIA in the last 168 hours. Coagulation Profile: No results for input(s): INR, PROTIME in the last 168 hours. Cardiac Enzymes: No results for input(s): CKTOTAL, CKMB, CKMBINDEX, TROPONINI in the last 168 hours. BNP (last 3 results) No results for input(s): PROBNP in the last 8760 hours. HbA1C: No results for input(s): HGBA1C in the last 72 hours. CBG: No results for input(s): GLUCAP in the last 168 hours. Lipid Profile: No results for input(s): CHOL, HDL, LDLCALC, TRIG, CHOLHDL, LDLDIRECT in the last 72 hours. Thyroid Function Tests: No results for input(s): TSH, T4TOTAL, FREET4, T3FREE, THYROIDAB in  the last 72 hours. Anemia Panel: No results for input(s): VITAMINB12, FOLATE, FERRITIN, TIBC, IRON, RETICCTPCT in the last 72 hours. Sepsis Labs: No results for input(s): PROCALCITON, LATICACIDVEN in the last 168 hours.  No results found for this or any previous visit (from the past 240 hour(s)).    Radiology Studies: No results found.    Scheduled Meds: . aspirin EC  81 mg Oral Daily  . atorvastatin  10 mg Oral Daily  . clopidogrel  75 mg Oral Q breakfast  . docusate sodium  100 mg Oral BID  . enoxaparin (LOVENOX) injection  30 mg Subcutaneous Q24H  . feeding supplement (GLUCERNA SHAKE)  237 mL Oral BID BM  . feeding supplement (PRO-STAT SUGAR FREE 64)  30 mL Oral BID  . ferrous sulfate  325 mg Oral Q breakfast  . metoprolol tartrate  12.5 mg Oral BID  . metroNIDAZOLE  500 mg Oral Q8H  . multivitamin with minerals  1 tablet Oral Daily  . polyethylene glycol  17 g Oral Daily  . rivastigmine  13.3 mg Transdermal Daily   Continuous Infusions: . sodium chloride    . cefTRIAXone (ROCEPHIN)  IV Stopped (11/29/16 2229)  . dextrose 75 mL/hr at  11/30/16 0950     LOS: 13 days    Time spent: Total of 25 minutes spent with pt, greater than 50% of which was spent in discussion of  treatment, counseling and coordination of care    Latrelle Dodrill, MD Pager: Text Page via [email or web address]   If 7PM-7AM, please contact night-coverage [email or web address] 11/30/2016, 3:42 PM

## 2016-11-30 NOTE — Progress Notes (Signed)
Order received to transfuse 1 unit RBCs. Patient only alert and oriented to self today, called emergency contact listed for consent. Voicemail left and awaiting response.

## 2016-11-30 NOTE — Progress Notes (Signed)
Attempted to change patient's dressings, but patient refused. In excruciating pain while changing, despite Tylenol being given. Foam dressings still in place and intact. Reported to oncoming nurse.

## 2016-12-01 LAB — BASIC METABOLIC PANEL
ANION GAP: 6 (ref 5–15)
BUN: 48 mg/dL — ABNORMAL HIGH (ref 6–20)
CALCIUM: 7.7 mg/dL — AB (ref 8.9–10.3)
CO2: 16 mmol/L — AB (ref 22–32)
CREATININE: 1.9 mg/dL — AB (ref 0.44–1.00)
Chloride: 123 mmol/L — ABNORMAL HIGH (ref 101–111)
GFR calc Af Amer: 28 mL/min — ABNORMAL LOW (ref >60)
GFR, EST NON AFRICAN AMERICAN: 24 mL/min — AB (ref >60)
Glucose, Bld: 69 mg/dL (ref 65–99)
Potassium: 3.6 mmol/L (ref 3.5–5.1)
Sodium: 145 mmol/L (ref 135–145)

## 2016-12-01 LAB — CBC
HCT: 24.5 % — ABNORMAL LOW (ref 36.0–46.0)
Hemoglobin: 7.6 g/dL — ABNORMAL LOW (ref 12.0–15.0)
MCH: 24.8 pg — ABNORMAL LOW (ref 26.0–34.0)
MCHC: 31 g/dL (ref 30.0–36.0)
MCV: 80.1 fL (ref 78.0–100.0)
PLATELETS: 298 10*3/uL (ref 150–400)
RBC: 3.06 MIL/uL — ABNORMAL LOW (ref 3.87–5.11)
RDW: 20.7 % — AB (ref 11.5–15.5)
WBC: 13.5 10*3/uL — AB (ref 4.0–10.5)

## 2016-12-01 LAB — FERRITIN: Ferritin: 551 ng/mL — ABNORMAL HIGH (ref 11–307)

## 2016-12-01 LAB — RETICULOCYTES
RBC.: 3.06 MIL/uL — AB (ref 3.87–5.11)
RETIC CT PCT: 3.2 % — AB (ref 0.4–3.1)
Retic Count, Absolute: 97.9 10*3/uL (ref 19.0–186.0)

## 2016-12-01 LAB — IRON AND TIBC
Iron: 26 ug/dL — ABNORMAL LOW (ref 28–170)
SATURATION RATIOS: 26 % (ref 10.4–31.8)
TIBC: 101 ug/dL — ABNORMAL LOW (ref 250–450)
UIBC: 75 ug/dL

## 2016-12-01 LAB — FOLATE: Folate: 6.2 ng/mL (ref >5.9)

## 2016-12-01 LAB — VITAMIN B12: Vitamin B-12: 975 pg/mL — ABNORMAL HIGH (ref 180–914)

## 2016-12-01 NOTE — Progress Notes (Addendum)
PROGRESS NOTE [facility]   Sylvia Medina   RUE:[id number] DOB: Jul 06, 1935  DOA: 11/17/2016 PCP: System, Pcp Not In   Brief Narrative:  Sylvia Medina 81 year old female with history of dementia, PVD, hyperthyroidism, diabetes mellitus, chronic kidney disease and history of CVA. Presented to the emergency department after being sent from SNF with elevated sodium and infected left leg. Shortly after admission patient became hypothermic and was transferred to stepdown unit. Patient was started on IV broad-spectrum antibiotics for her lower extremity ulcers. Orthopedic surgery was consulted and recommended amputation, however family is deferred in this for now. Vascular surgery was also consulted which did have no recommendations at this point. Case was discussed with infectious disease who recommended 4-6 week of ceftriaxone/Flagyl. Patient was evaluated by palliative care team but family will want to pursue aggressive treatment for now. Patient now anemic with hemoglobin 7.0 she will be transfused 1 unit of PRBCs.  Subjective: Patient report doing well, she was hypothermic last night and placed on [place] bear hugger. Patient remains asymptomatic, although BP is low. Patient was transferred to step down.   Assessment & Plan: Bilateral lower extremity ulcers In setting of diabetes and moderate to severe PVD bilateral. Orthopedic surgery consulted and recommended amputation. Family opted to defer this option for now. Vascular surgery with no recommendations. ID recommending ceftriaxone/Friday for 4-6 week at SNF. This alternative is likely to be temporary as patient has dementia is not ambulatory and with unhealed ulcer/skin breakdown. Continue IV antibiotics for now. Pain medications as needed. Hypothermia is related to this infection which abx therapy may not been enough. Called daughter x 3 with no response, need PICC line to continue IV treatment.   Chronic kidney disease stage IV Cr  improved, seems to be below previous baseline  Continue to monitor  Hypothermia - resolved  Peripheral vascular disease Not candidate for surgical intervention Continue statin, aspirin and Plavix  Hypertension blood pressure stable Continue current regimen  Anemia of chronic disease - PRBC's was held last night due to low temp's  Likely due to chronic kidney disease Hgb stable today, so will hold transfusion  Anemia panel shows Iron deficiency will treat with IV iron at some point  Check CBC in the morning Hold Lovenox  DVT prophylaxis: SCDs Code Status: Full code Family Communication: Attempting to call daughter with no response, unable to leave VM as inbox is full  Disposition Plan: SNF in next 24-48 hrs    Consultants:   Orthopedic surgery  Procedures:   None  Antimicrobials: Anti-infectives    Start     Dose/Rate Route Frequency Ordered Stop   11/19/16 2200  vancomycin (VANCOCIN) 1,500 mg in sodium chloride 0.9 % 500 mL IVPB  Status:  Discontinued     1,500 mg 250 mL/hr over 120 Minutes Intravenous Every 48 hours 11/18/16 0000 11/21/16 1214   11/18/16 0000  vancomycin (VANCOCIN) 2,000 mg in sodium chloride 0.9 % 500 mL IVPB     2,000 mg 250 mL/hr over 120 Minutes Intravenous  Once 11/17/16 2354 11/18/16 0334   11/17/16 2345  vancomycin (VANCOCIN) IVPB 1000 mg/200 mL premix  Status:  Discontinued     1,000 mg 200 mL/hr over 60 Minutes Intravenous  Once 11/17/16 2336 11/17/16 2354   11/17/16 2245  metroNIDAZOLE (FLAGYL) tablet 500 mg     500 mg Oral Every 8 hours 11/17/16 2232     11/17/16 2231  cefTRIAXone (ROCEPHIN) 2 g in dextrose 5 % 50 mL IVPB  2 g 100 mL/hr over 30 Minutes Intravenous Every 24 hours 11/17/16 2232     11/17/16 1830  vancomycin (VANCOCIN) 2,000 mg in sodium chloride 0.9 % 500 mL IVPB  Status:  Discontinued     2,000 mg 250 mL/hr over 120 Minutes Intravenous  Once 11/17/16 1820 11/17/16 1824   11/17/16 1830  piperacillin-tazobactam (ZOSYN)  IVPB 3.375 g     3.375 g 100 mL/hr over 30 Minutes Intravenous  Once 11/17/16 1820 11/17/16 1931      Objective: Vitals:   11/30/16 2254 12/01/16 0005 12/01/16 0200 12/01/16 0414  BP: (!) 145/58   (!) 127/47  Pulse: 63   78  Resp: 17   18  Temp:  (!) 94 F (34.4 C) (!) 94.5 F (34.7 C) 97.7 F (36.5 C)  TempSrc:  Rectal Oral Oral  SpO2: 99%   99%  Weight:      Height:        Intake/Output Summary (Last 24 hours) at 12/01/16 0905 Last data filed at 12/01/16 0505  Gross per 24 hour  Intake              690 ml  Output                0 ml  Net              690 ml   Filed Weights   11/17/16 2343 11/19/16 0549  Weight: 114.3 kg (252 lb) 116.2 kg (256 lb 1.6 oz)    Examination: - No changes in physical exam from 11/30/16  General exam: NAD  HEENT: AC/AT, PERRLA Respiratory system: Clear to auscultation. No wheezes,crackle or rhonchi, normal respiratory effort  Cardiovascular system: S1 & S2 heard, RRR. No JVD, murmurs, rubs or gallops Gastrointestinal system: Abdomen is nondistended, soft and nontender.  Central nervous system: Oriented to person.  Extremities: Bilateral legs have necrotic open ulcers with slight surrounding erythema   Skin: No rashes, lesions or ulcers Psychiatry: Judgement and insight poor due to dementia. Mood & affect flat.    Data Reviewed: I have personally reviewed following labs and imaging studies  CBC:  Recent Labs Lab 11/25/16 0807 11/27/16 1419 11/28/16 0356 11/29/16 0525 12/01/16 0459  WBC 12.2* 12.5* 11.7* 11.1* 13.5*  HGB 8.0* 7.8* 7.5* 7.0* 7.6*  HCT 24.9* 24.5* 24.2* 22.8* 24.5*  MCV 78.3 80.3 80.1 80.9 80.1  PLT 454* 536* 485* 396 298   Basic Metabolic Panel:  Recent Labs Lab 11/25/16 0807 11/27/16 1419 11/28/16 0356 11/29/16 0525 12/01/16 0459  NA 140 144 145 146* 145  K 3.1* 4.5 4.5 3.4* 3.6  CL 114* 125* 123* 127* 123*  CO2 17* 15* 15* 12* 16*  GLUCOSE 148* 128* 134* 77 69  BUN 45* 50* 55* 46* 48*    CREATININE 2.28* 2.37* 2.41* 1.98* 1.90*  CALCIUM 8.4* 8.2* 8.4* 7.0* 7.7*   GFR: Estimated Creatinine Clearance: 31.1 mL/min ([place]) (by C-G formula based on SCr of 1.9 mg/dL (H)). Liver Function Tests: No results for input(s): AST, ALT, ALKPHOS, BILITOT, PROT, ALBUMIN in the last 168 hours. No results for input(s): LIPASE, AMYLASE in the last 168 hours. No results for input(s): AMMONIA in the last 168 hours. Coagulation Profile: No results for input(s): INR, PROTIME in the last 168 hours. Cardiac Enzymes: No results for input(s): CKTOTAL, CKMB, CKMBINDEX, TROPONINI in the last 168 hours. BNP (last 3 results) No results for input(s): PROBNP in the last 8760 hours. HbA1C: No results for input(s): HGBA1C in  the last 72 hours. CBG: No results for input(s): GLUCAP in the last 168 hours. Lipid Profile: No results for input(s): CHOL, HDL, LDLCALC, TRIG, CHOLHDL, LDLDIRECT in the last 72 hours. Thyroid Function Tests: No results for input(s): TSH, T4TOTAL, FREET4, T3FREE, THYROIDAB in the last 72 hours. Anemia Panel:  Recent Labs  12/01/16 0554  VITAMINB12 975*  FOLATE 6.2  FERRITIN 551*  TIBC 101*  IRON 26*  RETICCTPCT 3.2*   Sepsis Labs: No results for input(s): PROCALCITON, LATICACIDVEN in the last 168 hours.  No results found for this or any previous visit (from the past 240 hour(s)).    Radiology Studies: No results found.    Scheduled Meds: . aspirin EC  81 mg Oral Daily  . atorvastatin  10 mg Oral Daily  . clopidogrel  75 mg Oral Q breakfast  . docusate sodium  100 mg Oral BID  . enoxaparin (LOVENOX) injection  30 mg Subcutaneous Q24H  . feeding supplement (GLUCERNA SHAKE)  237 mL Oral BID BM  . feeding supplement (PRO-STAT SUGAR FREE 64)  30 mL Oral BID  . ferrous sulfate  325 mg Oral Q breakfast  . metoprolol tartrate  12.5 mg Oral BID  . metroNIDAZOLE  500 mg Oral Q8H  . multivitamin with minerals  1 tablet Oral Daily  . polyethylene glycol  17 g Oral Daily   . rivastigmine  13.3 mg Transdermal Daily   Continuous Infusions: . sodium chloride    . cefTRIAXone (ROCEPHIN)  IV Stopped (12/01/16 0124)     LOS: 14 days    Time spent: Total of 15 minutes spent with pt, greater than 50% of which was spent in discussion of  treatment, counseling and coordination of care    Latrelle Dodrill, MD Pager: Text Page via [email or web address]   If 7PM-7AM, please contact night-coverage [email or web address] 12/01/2016, 9:05 AM

## 2016-12-01 NOTE — Progress Notes (Signed)
Report given to RN from [facility]E [facility] transferred pt. Via bed to room 4E 03.

## 2016-12-01 NOTE — Progress Notes (Signed)
Patient's temp noted to be 100.1.  Bear hugger turned down slightly.  Will continue to monitor.

## 2016-12-01 NOTE — Progress Notes (Signed)
Wound care done on left lateral calf and right foot as per orders.

## 2016-12-01 NOTE — Progress Notes (Signed)
Patient oral temp 94.5. Bair Hugger applied will monitor patient closely.

## 2016-12-01 NOTE — Progress Notes (Signed)
Notified Blount, NP that pt's rectal temp is 94.0 after applying warm blankets. NP placed order to transfer pt to stepdown and apply warming blanket. Will continue to monitor pt. Nelda MarseilleJenny Thacker, RN

## 2016-12-01 NOTE — Care Management Important Message (Signed)
Important Message  Patient Details  Name: Sylvia Medina MRN: [id number] Date of Birth: Aug 19, 1935   Medicare Important Message Given:  Yes    Kyla BalzarineShealy, Guillermina Shaft Abena 12/01/2016, 12:17 PM

## 2016-12-01 NOTE — Progress Notes (Signed)
Rechecked patient's temperature after prior resulr of 94.5 and after applying bair hugger 97.7

## 2016-12-02 ENCOUNTER — Inpatient Hospital Stay (HOSPITAL_COMMUNITY): Payer: Medicare Other

## 2016-12-02 ENCOUNTER — Encounter (HOSPITAL_COMMUNITY): Payer: Self-pay | Admitting: Interventional Radiology

## 2016-12-02 HISTORY — PX: IR US GUIDE VASC ACCESS RIGHT: IMG2390

## 2016-12-02 HISTORY — PX: IR FLUORO GUIDE CV LINE RIGHT: IMG2283

## 2016-12-02 MED ORDER — ENOXAPARIN SODIUM 40 MG/0.4ML ~~LOC~~ SOLN
40.0000 mg | SUBCUTANEOUS | Status: DC
  Administered 2016-12-03 – 2016-12-05 (×3): 40 mg via SUBCUTANEOUS
  Filled 2016-12-02 (×3): qty 0.4

## 2016-12-02 MED ORDER — GELATIN ABSORBABLE 12-7 MM EX MISC
CUTANEOUS | Status: AC
  Filled 2016-12-02: qty 1

## 2016-12-02 MED ORDER — LIDOCAINE HCL 1 % IJ SOLN
INTRAMUSCULAR | Status: AC
  Filled 2016-12-02: qty 20

## 2016-12-02 NOTE — Clinical Social Work Note (Signed)
[facility]. CSW asked why they did that when patient is a long-term resident. Admissions coordinator was unable to pull up long-term care list to confirm that is her status. They will be unable to order another bariatric bed until Monday. MD updated.  Charlynn CourtSarah Aphrodite Harpenau, CSW [phone]

## 2016-12-02 NOTE — Plan of Care (Signed)
Problem: Skin Integrity: Goal: Risk for impaired skin integrity will decrease Outcome: Progressing Pt on air matress

## 2016-12-02 NOTE — Progress Notes (Signed)
PROGRESS NOTE [facility]   Sylvia Medina   ZOX:[id number] DOB: 05-16-1935  DOA: 11/17/2016 PCP: System, Pcp Not In   Brief Narrative:  Sylvia Medina 81 year old female with history of dementia, PVD, hyperthyroidism, diabetes mellitus, chronic kidney disease and history of CVA. Presented to the emergency department after being sent from SNF with elevated sodium and infected left leg. Shortly after admission patient became hypothermic and was transferred to stepdown unit. Patient was started on IV broad-spectrum antibiotics for her lower extremity ulcers. Orthopedic surgery was consulted and recommended amputation, however family is deferred in this for now. Vascular surgery was also consulted which did have no recommendations at this point. Case was discussed with infectious disease who recommended 4-6 week of ceftriaxone/Flagyl. Patient was evaluated by palliative care team but family will want to pursue aggressive treatment for now. Patient now anemic with hemoglobin 7.0 she will be transfused 1 unit of PRBCs.  Subjective: Patient continue to feel well, has no complaints. Vital signs stable today. No fever or hypothermia overnight  Assessment & Plan: Bilateral lower extremity ulcers In setting of diabetes and moderate to severe PVD bilateral. Orthopedic surgery consulted and recommended amputation. Family opted to defer this option for now. Vascular surgery with no recommendations. ID recommending ceftriaxone/Friday for 4-6 week at SNF. This alternative is likely to be temporary as patient has dementia is not ambulatory and with unhealed ulcer/skin breakdown. Continue IV antibiotics for now. Pain medications as needed. Hypothermia is related to this infection which abx therapy may not been enough. Case discussed with daughter today, they are aware that abx may not work but they want to try. Explained that intermittent hypothermia and hypotension may be a signs of abx failure but  they wish to procedure with current treatment at this time. No interest on surgical intervention. Ask if ok to place a PICC line which they agree. Patient can be discharge to SNF when bed available on currents abx,   Chronic kidney disease stage IV Cr improved, seems to be below previous baseline  Continue to monitor  Hypothermia - resolved  Peripheral vascular disease Not candidate for surgical intervention Continue statin, aspirin and Plavix  Hypertension blood pressure stable Continue current regimen  Anemia of chronic disease - PRBC's was held last night due to low temp's  Likely due to chronic kidney disease Anemia panel shows Iron deficiency will treat with IV iron at some point  No signs of overt bleeding  Holding Lovenox Check CBC in AM   DVT prophylaxis: SCDs Code Status: Full code Family Communication: Case discussed with daughter  Disposition Plan: SNF when bed available    Consultants:   Orthopedic surgery  Procedures:   None  Antimicrobials: Anti-infectives    Start     Dose/Rate Route Frequency Ordered Stop   11/19/16 2200  vancomycin (VANCOCIN) 1,500 mg in sodium chloride 0.9 % 500 mL IVPB  Status:  Discontinued     1,500 mg 250 mL/hr over 120 Minutes Intravenous Every 48 hours 11/18/16 0000 11/21/16 1214   11/18/16 0000  vancomycin (VANCOCIN) 2,000 mg in sodium chloride 0.9 % 500 mL IVPB     2,000 mg 250 mL/hr over 120 Minutes Intravenous  Once 11/17/16 2354 11/18/16 0334   11/17/16 2345  vancomycin (VANCOCIN) IVPB 1000 mg/200 mL premix  Status:  Discontinued     1,000 mg 200 mL/hr over 60 Minutes Intravenous  Once 11/17/16 2336 11/17/16 2354   11/17/16 2245  metroNIDAZOLE (FLAGYL) tablet 500 mg  500 mg Oral Every 8 hours 11/17/16 2232     11/17/16 2231  cefTRIAXone (ROCEPHIN) 2 g in dextrose 5 % 50 mL IVPB     2 g 100 mL/hr over 30 Minutes Intravenous Every 24 hours 11/17/16 2232     11/17/16 1830  vancomycin (VANCOCIN) 2,000 mg in sodium  chloride 0.9 % 500 mL IVPB  Status:  Discontinued     2,000 mg 250 mL/hr over 120 Minutes Intravenous  Once 11/17/16 1820 11/17/16 1824   11/17/16 1830  piperacillin-tazobactam (ZOSYN) IVPB 3.375 g     3.375 g 100 mL/hr over 30 Minutes Intravenous  Once 11/17/16 1820 11/17/16 1931      Objective: Vitals:   12/02/16 0456 12/02/16 0500 12/02/16 0753 12/02/16 1147  BP: (!) 105/40  (!) 147/49 (!) 116/41  Pulse:  68 89 71  Resp: 20 19 18 15   Temp: 97.9 F (36.6 C)  97.9 F (36.6 C) 97.8 F (36.6 C)  TempSrc: Oral  Oral Oral  SpO2: 100% 99% 100% 100%  Weight:  (!) 156.9 kg (346 lb)    Height:        Intake/Output Summary (Last 24 hours) at 12/02/16 1849 Last data filed at 12/02/16 1657  Gross per 24 hour  Intake              220 ml  Output              300 ml  Net              -80 ml   Filed Weights   11/17/16 2343 11/19/16 0549 12/02/16 0500  Weight: 114.3 kg (252 lb) 116.2 kg (256 lb 1.6 oz) (!) 156.9 kg (346 lb)    Examination:   General exam: NAD  HEENT: AC/AT, PERRLA Respiratory system: Clear to auscultation. No wheezes,crackle or rhonchi, normal respiratory effort  Cardiovascular system: S1 & S2 heard, RRR. No JVD, murmurs, rubs or gallops Gastrointestinal system: Abdomen is nondistended, soft and nontender.  Central nervous system: Oriented to person.  Extremities: Bilateral legs have necrotic open ulcers with slight surrounding erythema   Skin: No rashes, lesions or ulcers Psychiatry: Judgement and insight poor due to dementia. Mood & affect flat.   Data Reviewed: I have personally reviewed following labs and imaging studies  CBC:  Recent Labs Lab 11/27/16 1419 11/28/16 0356 11/29/16 0525 12/01/16 0459  WBC 12.5* 11.7* 11.1* 13.5*  HGB 7.8* 7.5* 7.0* 7.6*  HCT 24.5* 24.2* 22.8* 24.5*  MCV 80.3 80.1 80.9 80.1  PLT 536* 485* 396 086   Basic Metabolic Panel:  Recent Labs Lab 11/27/16 1419 11/28/16 0356 11/29/16 0525 12/01/16 0459  NA 144 145  146* 145  K 4.5 4.5 3.4* 3.6  CL 125* 123* 127* 123*  CO2 15* 15* 12* 16*  GLUCOSE 128* 134* 77 69  BUN 50* 55* 46* 48*  CREATININE 2.37* 2.41* 1.98* 1.90*  CALCIUM 8.2* 8.4* 7.0* 7.7*   GFR: Estimated Creatinine Clearance: 37.3 mL/min (A) (by C-G formula based on SCr of 1.9 mg/dL (H)). Liver Function Tests: No results for input(s): AST, ALT, ALKPHOS, BILITOT, PROT, ALBUMIN in the last 168 hours. No results for input(s): LIPASE, AMYLASE in the last 168 hours. No results for input(s): AMMONIA in the last 168 hours. Coagulation Profile: No results for input(s): INR, PROTIME in the last 168 hours. Cardiac Enzymes: No results for input(s): CKTOTAL, CKMB, CKMBINDEX, TROPONINI in the last 168 hours. BNP (last 3 results) No results for input(s): PROBNP  in the last 8760 hours. HbA1C: No results for input(s): HGBA1C in the last 72 hours. CBG: No results for input(s): GLUCAP in the last 168 hours. Lipid Profile: No results for input(s): CHOL, HDL, LDLCALC, TRIG, CHOLHDL, LDLDIRECT in the last 72 hours. Thyroid Function Tests: No results for input(s): TSH, T4TOTAL, FREET4, T3FREE, THYROIDAB in the last 72 hours. Anemia Panel:  Recent Labs  12/01/16 0554  VITAMINB12 975*  FOLATE 6.2  FERRITIN 551*  TIBC 101*  IRON 26*  RETICCTPCT 3.2*   Sepsis Labs: No results for input(s): PROCALCITON, LATICACIDVEN in the last 168 hours.  No results found for this or any previous visit (from the past 240 hour(s)).    Radiology Studies: Ir Fluoro Guide Cv Line Right  Result Date: 12/02/2016 INDICATION: Sacral decubitus ulcer with infection, access for antibiotics EXAM: ULTRASOUND AND FLUOROSCOPIC RIGHT IJ DOUBLE-LUMEN TUNNELED PICC LINE INSERTION MEDICATIONS: 1% LIDOCAINE LOCALLY CONTRAST:  None FLUOROSCOPY TIME:  Six seconds (1 mGy) COMPLICATIONS: None immediate. TECHNIQUE: The procedure, risks, benefits, and alternatives were explained to the patient and informed written consent was obtained.  A timeout was performed prior to the initiation of the procedure. The right neck and chest were prepped with chlorhexidine in a sterile fashion, and a sterile drape was applied covering the operative field. Maximum barrier sterile technique with sterile gowns and gloves were used for the procedure. A timeout was performed prior to the initiation of the procedure. Local anesthesia was provided with 1% lidocaine. Under direct ultrasound guidance, the right internal jugular vein was accessed with a micropuncture kit after the overlying soft tissues were anesthetized with 1% lidocaine. An ultrasound image was saved for documentation purposes. A guidewire was advanced to the level of the superior caval-atrial junction for measurement purposes and the PICC line was cut to length. In the right chest, a subcutaneous tunnel was created under sterile conditions and local anesthesia. The 6 French double-lumen cuffed PICC line was tunneled subcutaneously to the venotomy site. A peel-away sheath was placed and a 21 cm, 6 [place], dual lumen was inserted to level of the superior caval-atrial junction. A post procedure spot fluoroscopic was obtained. The catheter easily aspirated and flushed and was sutured in place. Venotomy site closed with derma bond. A dressing was placed. The patient tolerated the procedure well without immediate post procedural complication. FINDINGS: After catheter placement, the tip lies within the superior cavoatrial junction. The catheter aspirates and flushes normally and is ready for immediate use. IMPRESSION: Successful ultrasound and fluoroscopic guided placement of a right internal jugular vein approach, 21 cm, 6 French, double-lumen tunneled power PICC with tip at the superior caval-atrial junction. The PICC line is ready for immediate use. Electronically Signed   By: Jerilynn Mages.  Shick M.D.   On: 12/02/2016 15:25   Ir US Guide Vasc Access Right  Result Date: 12/02/2016 INDICATION: Sacral decubitus  ulcer with infection, access for antibiotics EXAM: ULTRASOUND AND FLUOROSCOPIC RIGHT IJ DOUBLE-LUMEN TUNNELED PICC LINE INSERTION MEDICATIONS: 1% LIDOCAINE LOCALLY CONTRAST:  None FLUOROSCOPY TIME:  Six seconds (1 mGy) COMPLICATIONS: None immediate. TECHNIQUE: The procedure, risks, benefits, and alternatives were explained to the patient and informed written consent was obtained. A timeout was performed prior to the initiation of the procedure. The right neck and chest were prepped with chlorhexidine in a sterile fashion, and a sterile drape was applied covering the operative field. Maximum barrier sterile technique with sterile gowns and gloves were used for the procedure. A timeout was performed prior to the initiation of  the procedure. Local anesthesia was provided with 1% lidocaine. Under direct ultrasound guidance, the right internal jugular vein was accessed with a micropuncture kit after the overlying soft tissues were anesthetized with 1% lidocaine. An ultrasound image was saved for documentation purposes. A guidewire was advanced to the level of the superior caval-atrial junction for measurement purposes and the PICC line was cut to length. In the right chest, a subcutaneous tunnel was created under sterile conditions and local anesthesia. The 6 French double-lumen cuffed PICC line was tunneled subcutaneously to the venotomy site. A peel-away sheath was placed and a 21 cm, 6 [place], dual lumen was inserted to level of the superior caval-atrial junction. A post procedure spot fluoroscopic was obtained. The catheter easily aspirated and flushed and was sutured in place. Venotomy site closed with derma bond. A dressing was placed. The patient tolerated the procedure well without immediate post procedural complication. FINDINGS: After catheter placement, the tip lies within the superior cavoatrial junction. The catheter aspirates and flushes normally and is ready for immediate use. IMPRESSION: Successful  ultrasound and fluoroscopic guided placement of a right internal jugular vein approach, 21 cm, 6 French, double-lumen tunneled power PICC with tip at the superior caval-atrial junction. The PICC line is ready for immediate use. Electronically Signed   By: Jerilynn Mages.  Shick M.D.   On: 12/02/2016 15:25      Scheduled Meds: . aspirin EC  81 mg Oral Daily  . atorvastatin  10 mg Oral Daily  . clopidogrel  75 mg Oral Q breakfast  . docusate sodium  100 mg Oral BID  . [START ON 12/03/2016] enoxaparin (LOVENOX) injection  40 mg Subcutaneous Q24H  . feeding supplement (GLUCERNA SHAKE)  237 mL Oral BID BM  . feeding supplement (PRO-STAT SUGAR FREE 64)  30 mL Oral BID  . ferrous sulfate  325 mg Oral Q breakfast  . gelatin adsorbable      . lidocaine      . metoprolol tartrate  12.5 mg Oral BID  . metroNIDAZOLE  500 mg Oral Q8H  . multivitamin with minerals  1 tablet Oral Daily  . polyethylene glycol  17 g Oral Daily  . rivastigmine  13.3 mg Transdermal Daily   Continuous Infusions: . cefTRIAXone (ROCEPHIN)  IV Stopped (12/01/16 2230)     LOS: 15 days    Time spent: Total of 15 minutes spent with pt, greater than 50% of which was spent in discussion of  treatment, counseling and coordination of care    Chipper Oman, MD Pager: Text Page via [email or web address]   If 7PM-7AM, please contact night-coverage [email or web address] 12/02/2016, 6:49 PM

## 2016-12-02 NOTE — Procedures (Signed)
Infected sacral decub  S/p RT IJ TUNNELED DL PICC TIP SVCRA NO COMP STABLE READY FOR USE FULL REPORT IN PACS

## 2016-12-02 NOTE — Clinical Social Work Note (Signed)
CSW continues to follow for discharge needs.  Emmalise Huard, CSW [phone]  

## 2016-12-02 NOTE — Care Management Note (Addendum)
Case Management Note Original Note Created Leone Havenaylor, Deborah Clinton, RN 11/22/2016, 1:04 PM  Patient Details  Name: Gala Murdochvelyn B Brunner MRN: [id number] Date of Birth: [birth year]  Subjective/Objective:    [facility], bil le ulcers, ckd stage 4, hypothermia, dementia, conts on rocephin and falgyl, may need for 6 weeks orhto consulted and recs amputation.  Plan is to return to SNF.  CSW following.   10/10 1935 Letha Capeeborah Taylor RN, BSN- family has declined amputation , per ID an alternative would be for rocephin/flagyl for 4-6 weeks if she is stable, but unfortunately  she was hypotensive and hypothermic again last pm, Palliative consulted.                 Action/Plan: NCM will follow along with CSW for dc needs.  Expected Discharge Date:                  Expected Discharge Plan:  Skilled Nursing Facility  In-House Referral:  Clinical Social Work  Discharge planning Services  CM Consult  Post Acute Care Choice:  NA Choice offered to:  NA  DME Arranged:    DME Agency:     HH Arranged:    HH Agency:     Status of Service:  Completed, signed off  If discussed at [employer] Length of Stay Meetings, dates discussed:    Discharge Disposition: skilled facility   Additional Comments:  12/02/16- 1500- Rayah Fines RN, CM- pt for SNF discharge-- will need 4-6 wks of IV abx- to have PICC line placed today- then will be medically stable for discharge- CSW following for placement needs- [facility]   Zenda AlpersWebster, AlmaKristi Hall, RN 12/02/2016, 3:01 PM [phone]

## 2016-12-02 NOTE — Plan of Care (Signed)
Problem: Physical Regulation: Goal: Ability to maintain clinical measurements within normal limits will improve Outcome: Not Progressing Hypothermic; bair hugger in place

## 2016-12-02 NOTE — Progress Notes (Signed)
Pt temp 93.5 rectally, bair hugger placed pt in no distress. Will continue to monitor.

## 2016-12-03 MED ORDER — SODIUM CHLORIDE 0.9 % IV SOLN
125.0000 mg | Freq: Once | INTRAVENOUS | Status: AC
  Administered 2016-12-03: 125 mg via INTRAVENOUS
  Filled 2016-12-03: qty 10

## 2016-12-03 NOTE — Progress Notes (Signed)
PROGRESS NOTE [facility]   Sylvia Medina   HKV:[id number] DOB: [birth year]  DOA: 11/17/2016 PCP: System, Pcp Not In   Brief Narrative:  Sylvia Medina 81 year old female with history of dementia, PVD, hyperthyroidism, diabetes mellitus, chronic kidney disease and history of CVA. Presented to the emergency department after being sent from SNF with elevated sodium and infected left leg. Shortly after admission patient became hypothermic and was transferred to stepdown unit. Patient was started on IV broad-spectrum antibiotics for her lower extremity ulcers. Orthopedic surgery was consulted and recommended amputation, however family is deferred in this for now. Vascular surgery was also consulted which did have no recommendations at this point. Case was discussed with infectious disease who recommended 4-6 week of ceftriaxone/Flagyl. Patient was evaluated by palliative care team but family will want to pursue aggressive treatment for now.  Patient was found to be anemic.  1 unit of PRBC was going to be transfused blood due to hypothermia this was cancelled.  Hemoglobin now remains stable.   Subjective: Patient seen and examined, was hypothermic overnight place it in a bear hugger.  This morning patient continues with no complaints.  Denies chest pain, shortness of breath, dizziness and palpitation.  Tolerating diet well.  Temperature back to normal   Assessment & Plan: Bilateral lower extremity ulcers - infected In setting of diabetes and moderate to severe PVD bilateral. Orthopedic surgery consulted and recommended amputation. Family opted to defer this option for now. Vascular surgery with no recommendations. ID recommending ceftriaxone/Friday for 4-6 week at SNF. This alternative is likely to be temporary as patient has dementia is not ambulatory and with unhealed ulcer/skin breakdown. Continue IV antibiotics for now. Pain medications as needed. Hypothermia is related to this  infection which abx therapy may not been enough. Case discussed with daughter today, they are aware that abx may not work but they want to try. Explained that intermittent hypothermia and hypotension may be a signs of abx failure but they wish to procedure with current treatment at this time. No interest on surgical intervention.  PICC line placed.  patient can be discharge to SNF when bed available on currents abx,   Chronic kidney disease stage IV Cr improved, seems to be below previous baseline  Will check Cr in AM   Hypothermia  This may be a signs of current infection, unclear if just antibiotic is taking care of infected ulcers.  Discussed with family that antibiotics may be a temporary solution.  They understand and will continue to proceed with antibiotic therapy.  They understand that hypothermia could be recurrent due to current infection.  Peripheral vascular disease Not candidate for surgical intervention Continue statin, aspirin and Plavix  Hypertension blood pressure stable Continue current regimen  Anemia of chronic disease - PRBC's was held last night due to low temp's  Likely due to chronic kidney disease Anemia panel shows Iron deficiency -IV iron ordered at 4 1 dose No signs of overt bleeding  Holding Lovenox Check CBC in the morning  DVT prophylaxis: SCDs Code Status: Full code Family Communication: Case discussed with daughter  Disposition Plan: SNF when bed available    Consultants:   Orthopedic surgery  Procedures:   None  Antimicrobials: Anti-infectives    Start     Dose/Rate Route Frequency Ordered Stop   11/19/16 2200  vancomycin (VANCOCIN) 1,500 mg in sodium chloride 0.9 % 500 mL IVPB  Status:  Discontinued     1,500 mg 250 mL/hr over 120 Minutes  Intravenous Every 48 hours 11/18/16 0000 11/21/16 1214   11/18/16 0000  vancomycin (VANCOCIN) 2,000 mg in sodium chloride 0.9 % 500 mL IVPB     2,000 mg 250 mL/hr over 120 Minutes Intravenous  Once  11/17/16 2354 11/18/16 0334   11/17/16 2345  vancomycin (VANCOCIN) IVPB 1000 mg/200 mL premix  Status:  Discontinued     1,000 mg 200 mL/hr over 60 Minutes Intravenous  Once 11/17/16 2336 11/17/16 2354   11/17/16 2245  metroNIDAZOLE (FLAGYL) tablet 500 mg     500 mg Oral Every 8 hours 11/17/16 2232     11/17/16 2231  cefTRIAXone (ROCEPHIN) 2 g in dextrose 5 % 50 mL IVPB     2 g 100 mL/hr over 30 Minutes Intravenous Every 24 hours 11/17/16 2232     11/17/16 1830  vancomycin (VANCOCIN) 2,000 mg in sodium chloride 0.9 % 500 mL IVPB  Status:  Discontinued     2,000 mg 250 mL/hr over 120 Minutes Intravenous  Once 11/17/16 1820 11/17/16 1824   11/17/16 1830  piperacillin-tazobactam (ZOSYN) IVPB 3.375 g     3.375 g 100 mL/hr over 30 Minutes Intravenous  Once 11/17/16 1820 11/17/16 1931      Objective: Vitals:   12/02/16 2120 12/02/16 2343 12/02/16 2356 12/03/16 0413  BP:  (!) 124/59  (!) 146/51  Pulse: (!) 50 77  73  Resp:  18  19  Temp:   (!) 93.9 F (34.4 C) 97.7 F (36.5 C)  TempSrc:   Rectal Oral  SpO2:  100%  100%  Weight:    (!) 158.3 kg (349 lb)  Height:        Intake/Output Summary (Last 24 hours) at 12/03/16 0852 Last data filed at 12/03/16 0417  Gross per 24 hour  Intake              360 ml  Output              600 ml  Net             -240 ml   Filed Weights   11/19/16 0549 12/02/16 0500 12/03/16 0413  Weight: 116.2 kg (256 lb 1.6 oz) (!) 156.9 kg (346 lb) (!) 158.3 kg (349 lb)    Examination:   General: Pt is alert, awake, not in acute distress Cardiovascular: RRR, S1/S2  Respiratory: CTA bilaterally Abdominal: Soft, NT ND Extremities: Bilateral leg ulcers with surrounding erythema mildly improved  Data Reviewed: I have personally reviewed following labs and imaging studies  CBC:  Recent Labs Lab 11/27/16 1419 11/28/16 0356 11/29/16 0525 12/01/16 0459  WBC 12.5* 11.7* 11.1* 13.5*  HGB 7.8* 7.5* 7.0* 7.6*  HCT 24.5* 24.2* 22.8* 24.5*  MCV 80.3 80.1  80.9 80.1  PLT 536* 485* 396 161   Basic Metabolic Panel:  Recent Labs Lab 11/27/16 1419 11/28/16 0356 11/29/16 0525 12/01/16 0459  NA 144 145 146* 145  K 4.5 4.5 3.4* 3.6  CL 125* 123* 127* 123*  CO2 15* 15* 12* 16*  GLUCOSE 128* 134* 77 69  BUN 50* 55* 46* 48*  CREATININE 2.37* 2.41* 1.98* 1.90*  CALCIUM 8.2* 8.4* 7.0* 7.7*   GFR: Estimated Creatinine Clearance: 37.5 mL/min (A) (by C-G formula based on SCr of 1.9 mg/dL (H)). Liver Function Tests: No results for input(s): AST, ALT, ALKPHOS, BILITOT, PROT, ALBUMIN in the last 168 hours. No results for input(s): LIPASE, AMYLASE in the last 168 hours. No results for input(s): AMMONIA in the last 168 hours.  Coagulation Profile: No results for input(s): INR, PROTIME in the last 168 hours. Cardiac Enzymes: No results for input(s): CKTOTAL, CKMB, CKMBINDEX, TROPONINI in the last 168 hours. BNP (last 3 results) No results for input(s): PROBNP in the last 8760 hours. HbA1C: No results for input(s): HGBA1C in the last 72 hours. CBG: No results for input(s): GLUCAP in the last 168 hours. Lipid Profile: No results for input(s): CHOL, HDL, LDLCALC, TRIG, CHOLHDL, LDLDIRECT in the last 72 hours. Thyroid Function Tests: No results for input(s): TSH, T4TOTAL, FREET4, T3FREE, THYROIDAB in the last 72 hours. Anemia Panel:  Recent Labs  12/01/16 0554  VITAMINB12 975*  FOLATE 6.2  FERRITIN 551*  TIBC 101*  IRON 26*  RETICCTPCT 3.2*   Sepsis Labs: No results for input(s): PROCALCITON, LATICACIDVEN in the last 168 hours.  No results found for this or any previous visit (from the past 240 hour(s)).    Radiology Studies: Ir Fluoro Guide Cv Line Right  Result Date: 12/02/2016 INDICATION: Sacral decubitus ulcer with infection, access for antibiotics EXAM: ULTRASOUND AND FLUOROSCOPIC RIGHT IJ DOUBLE-LUMEN TUNNELED PICC LINE INSERTION MEDICATIONS: 1% LIDOCAINE LOCALLY CONTRAST:  None FLUOROSCOPY TIME:  Six seconds (1 mGy)  COMPLICATIONS: None immediate. TECHNIQUE: The procedure, risks, benefits, and alternatives were explained to the patient and informed written consent was obtained. A timeout was performed prior to the initiation of the procedure. The right neck and chest were prepped with chlorhexidine in a sterile fashion, and a sterile drape was applied covering the operative field. Maximum barrier sterile technique with sterile gowns and gloves were used for the procedure. A timeout was performed prior to the initiation of the procedure. Local anesthesia was provided with 1% lidocaine. Under direct ultrasound guidance, the right internal jugular vein was accessed with a micropuncture kit after the overlying soft tissues were anesthetized with 1% lidocaine. An ultrasound image was saved for documentation purposes. A guidewire was advanced to the level of the superior caval-atrial junction for measurement purposes and the PICC line was cut to length. In the right chest, a subcutaneous tunnel was created under sterile conditions and local anesthesia. The 6 French double-lumen cuffed PICC line was tunneled subcutaneously to the venotomy site. A peel-away sheath was placed and a 21 cm, 6 [place], dual lumen was inserted to level of the superior caval-atrial junction. A post procedure spot fluoroscopic was obtained. The catheter easily aspirated and flushed and was sutured in place. Venotomy site closed with derma bond. A dressing was placed. The patient tolerated the procedure well without immediate post procedural complication. FINDINGS: After catheter placement, the tip lies within the superior cavoatrial junction. The catheter aspirates and flushes normally and is ready for immediate use. IMPRESSION: Successful ultrasound and fluoroscopic guided placement of a right internal jugular vein approach, 21 cm, 6 French, double-lumen tunneled power PICC with tip at the superior caval-atrial junction. The PICC line is ready for immediate  use. Electronically Signed   By: Jerilynn Mages.  Shick M.D.   On: 12/02/2016 15:25   Ir US Guide Vasc Access Right  Result Date: 12/02/2016 INDICATION: Sacral decubitus ulcer with infection, access for antibiotics EXAM: ULTRASOUND AND FLUOROSCOPIC RIGHT IJ DOUBLE-LUMEN TUNNELED PICC LINE INSERTION MEDICATIONS: 1% LIDOCAINE LOCALLY CONTRAST:  None FLUOROSCOPY TIME:  Six seconds (1 mGy) COMPLICATIONS: None immediate. TECHNIQUE: The procedure, risks, benefits, and alternatives were explained to the patient and informed written consent was obtained. A timeout was performed prior to the initiation of the procedure. The right neck and chest were prepped with chlorhexidine  in a sterile fashion, and a sterile drape was applied covering the operative field. Maximum barrier sterile technique with sterile gowns and gloves were used for the procedure. A timeout was performed prior to the initiation of the procedure. Local anesthesia was provided with 1% lidocaine. Under direct ultrasound guidance, the right internal jugular vein was accessed with a micropuncture kit after the overlying soft tissues were anesthetized with 1% lidocaine. An ultrasound image was saved for documentation purposes. A guidewire was advanced to the level of the superior caval-atrial junction for measurement purposes and the PICC line was cut to length. In the right chest, a subcutaneous tunnel was created under sterile conditions and local anesthesia. The 6 French double-lumen cuffed PICC line was tunneled subcutaneously to the venotomy site. A peel-away sheath was placed and a 21 cm, 6 [place], dual lumen was inserted to level of the superior caval-atrial junction. A post procedure spot fluoroscopic was obtained. The catheter easily aspirated and flushed and was sutured in place. Venotomy site closed with derma bond. A dressing was placed. The patient tolerated the procedure well without immediate post procedural complication. FINDINGS: After catheter  placement, the tip lies within the superior cavoatrial junction. The catheter aspirates and flushes normally and is ready for immediate use. IMPRESSION: Successful ultrasound and fluoroscopic guided placement of a right internal jugular vein approach, 21 cm, 6 French, double-lumen tunneled power PICC with tip at the superior caval-atrial junction. The PICC line is ready for immediate use. Electronically Signed   By: Jerilynn Mages.  Shick M.D.   On: 12/02/2016 15:25      Scheduled Meds: . aspirin EC  81 mg Oral Daily  . atorvastatin  10 mg Oral Daily  . clopidogrel  75 mg Oral Q breakfast  . docusate sodium  100 mg Oral BID  . enoxaparin (LOVENOX) injection  40 mg Subcutaneous Q24H  . feeding supplement (GLUCERNA SHAKE)  237 mL Oral BID BM  . feeding supplement (PRO-STAT SUGAR FREE 64)  30 mL Oral BID  . ferrous sulfate  325 mg Oral Q breakfast  . metoprolol tartrate  12.5 mg Oral BID  . metroNIDAZOLE  500 mg Oral Q8H  . multivitamin with minerals  1 tablet Oral Daily  . polyethylene glycol  17 g Oral Daily  . rivastigmine  13.3 mg Transdermal Daily   Continuous Infusions: . cefTRIAXone (ROCEPHIN)  IV Stopped (12/02/16 2142)     LOS: 16 days    Time spent: Total of 15 minutes spent with pt, greater than 50% of which was spent in discussion of  treatment, counseling and coordination of care    Chipper Oman, MD Pager: Text Page via [email or web address]   If 7PM-7AM, please contact night-coverage [email or web address] 12/03/2016, 8:52 AM

## 2016-12-03 NOTE — Progress Notes (Signed)
Pt's 0400 temp 97.7 orally, bair hugger turned down slightly. Will continue to monitor

## 2016-12-03 NOTE — Plan of Care (Signed)
Problem: Pain Managment: Goal: General experience of comfort will improve Outcome: Progressing Pain only with manipulation of legs, very intense especially during dressing changes  Problem: Activity: Goal: Risk for activity intolerance will decrease Outcome: Progressing Pt is non-ambulatory at baseline  Problem: Fluid Volume: Goal: Ability to maintain a balanced intake and output will improve Outcome: Not Progressing Pt w/ very low appetite. Ate a few bites of dinner and drank a little Ensure this pm.

## 2016-12-04 LAB — CBC
HEMATOCRIT: 24.8 % — AB (ref 36.0–46.0)
HEMOGLOBIN: 7.5 g/dL — AB (ref 12.0–15.0)
MCH: 24.7 pg — AB (ref 26.0–34.0)
MCHC: 30.2 g/dL (ref 30.0–36.0)
MCV: 81.6 fL (ref 78.0–100.0)
Platelets: 297 10*3/uL (ref 150–400)
RBC: 3.04 MIL/uL — ABNORMAL LOW (ref 3.87–5.11)
RDW: 21.7 % — AB (ref 11.5–15.5)
WBC: 11.8 10*3/uL — ABNORMAL HIGH (ref 4.0–10.5)

## 2016-12-04 LAB — TYPE AND SCREEN
ABO/RH(D): A POS
ANTIBODY SCREEN: NEGATIVE
Unit division: 0

## 2016-12-04 LAB — BASIC METABOLIC PANEL
ANION GAP: 4 — AB (ref 5–15)
BUN: 33 mg/dL — ABNORMAL HIGH (ref 6–20)
CHLORIDE: 124 mmol/L — AB (ref 101–111)
CO2: 20 mmol/L — AB (ref 22–32)
Calcium: 7.9 mg/dL — ABNORMAL LOW (ref 8.9–10.3)
Creatinine, Ser: 1.72 mg/dL — ABNORMAL HIGH (ref 0.44–1.00)
GFR calc Af Amer: 31 mL/min — ABNORMAL LOW (ref >60)
GFR, EST NON AFRICAN AMERICAN: 27 mL/min — AB (ref >60)
GLUCOSE: 132 mg/dL — AB (ref 65–99)
POTASSIUM: 3.5 mmol/L (ref 3.5–5.1)
Sodium: 148 mmol/L — ABNORMAL HIGH (ref 135–145)

## 2016-12-04 LAB — BPAM RBC
BLOOD PRODUCT EXPIRATION DATE: 201811092359
ISSUE DATE / TIME: 201810172119
Unit Type and Rh: 6200

## 2016-12-04 LAB — GLUCOSE, CAPILLARY: GLUCOSE-CAPILLARY: 140 mg/dL — AB (ref 65–99)

## 2016-12-04 MED ORDER — MORPHINE SULFATE (PF) 2 MG/ML IV SOLN
2.0000 mg | Freq: Once | INTRAVENOUS | Status: AC
  Administered 2016-12-04: 2 mg via INTRAVENOUS
  Filled 2016-12-04: qty 1

## 2016-12-04 NOTE — Progress Notes (Signed)
PROGRESS NOTE [facility]   CITLALY CAMPLIN   WJX:[id number] DOB: Mar 20, 1935  DOA: 11/17/2016 PCP: System, Pcp Not In   Brief Narrative:  Benard Halsted 81 year old female with history of dementia, PVD, hyperthyroidism, diabetes mellitus, chronic kidney disease and history of CVA. Presented to the emergency department after being sent from SNF with elevated sodium and infected left leg. Shortly after admission patient became hypothermic and was transferred to stepdown unit. Patient was started on IV broad-spectrum antibiotics for her lower extremity ulcers. Orthopedic surgery was consulted and recommended amputation, however family is deferred in this for now. Vascular surgery was also consulted which did have no recommendations at this point. Case was discussed with infectious disease who recommended 4-6 week of ceftriaxone/Flagyl. Patient was evaluated by palliative care team but family will want to pursue aggressive treatment for now.  Patient was found to be anemic.  1 unit of PRBC was going to be transfused blood due to hypothermia this was cancelled.  Hemoglobin now remains stable.   Subjective: No new changes, continues to do well. No complaints this morning   Assessment & Plan: Bilateral lower extremity ulcers - infected In setting of diabetes and moderate to severe PVD bilateral. Orthopedic surgery consulted and recommended amputation. Family opted to defer this option for now. Vascular surgery with no recommendations. ID recommending ceftriaxone/Friday for 4-6 week at SNF. This alternative is likely to be temporary as patient has dementia is not ambulatory and with unhealed ulcer/skin breakdown. Continue IV antibiotics for now. Pain medications as needed. Hypothermia is related to this infection which abx therapy may not been enough. Case discussed with daughter today, they are aware that abx may not work but they want to try. Explained that intermittent hypothermia and  hypotension may be a signs of abx failure but they wish to procedure with current treatment at this time. No interest on surgical intervention.  PICC line placed.  patient can be discharge to SNF when bed available on currents abx  Chronic kidney disease stage IV Cr improved, seems to be below previous baseline  Will check Cr in AM   Hypothermia  This may be a signs of current infection, unclear if just antibiotic is taking care of infected ulcers.  Discussed with family that antibiotics may be a temporary solution.  They understand and will continue to proceed with antibiotic therapy.  They understand that hypothermia could be recurrent due to current infection.  Peripheral vascular disease Not candidate for surgical intervention Continue statin, aspirin and Plavix  Hypertension blood pressure stable Continue current regimen  Anemia of chronic disease - PRBC's was held last night due to low temp's  Likely due to chronic kidney disease Anemia panel shows Iron deficiency -IV iron ordered at 4 1 dose No signs of overt bleeding  Holding Lovenox Check CBC in the morning  DVT prophylaxis: SCDs Code Status: Full code Family Communication: Case discussed with daughter  Disposition Plan: SNF when bed available    Consultants:   Orthopedic surgery  Procedures:   None  Antimicrobials: Anti-infectives    Start     Dose/Rate Route Frequency Ordered Stop   11/19/16 2200  vancomycin (VANCOCIN) 1,500 mg in sodium chloride 0.9 % 500 mL IVPB  Status:  Discontinued     1,500 mg 250 mL/hr over 120 Minutes Intravenous Every 48 hours 11/18/16 0000 11/21/16 1214   11/18/16 0000  vancomycin (VANCOCIN) 2,000 mg in sodium chloride 0.9 % 500 mL IVPB     2,000  mg 250 mL/hr over 120 Minutes Intravenous  Once 11/17/16 2354 11/18/16 0334   11/17/16 2345  vancomycin (VANCOCIN) IVPB 1000 mg/200 mL premix  Status:  Discontinued     1,000 mg 200 mL/hr over 60 Minutes Intravenous  Once 11/17/16 2336  11/17/16 2354   11/17/16 2245  metroNIDAZOLE (FLAGYL) tablet 500 mg     500 mg Oral Every 8 hours 11/17/16 2232     11/17/16 2231  cefTRIAXone (ROCEPHIN) 2 g in dextrose 5 % 50 mL IVPB     2 g 100 mL/hr over 30 Minutes Intravenous Every 24 hours 11/17/16 2232     11/17/16 1830  vancomycin (VANCOCIN) 2,000 mg in sodium chloride 0.9 % 500 mL IVPB  Status:  Discontinued     2,000 mg 250 mL/hr over 120 Minutes Intravenous  Once 11/17/16 1820 11/17/16 1824   11/17/16 1830  piperacillin-tazobactam (ZOSYN) IVPB 3.375 g     3.375 g 100 mL/hr over 30 Minutes Intravenous  Once 11/17/16 1820 11/17/16 1931      Objective: Vitals:   12/03/16 2219 12/04/16 0013 12/04/16 0339 12/04/16 0735  BP: (!) 130/54 126/64 (!) 125/59 (!) 121/45  Pulse: 80 78 77 76  Resp:    19  Temp: 98.8 F (37.1 C) 97.7 F (36.5 C) 97.8 F (36.6 C) 98 F (36.7 C)  TempSrc: Oral Oral Oral Oral  SpO2: 100% 100% 100% 99%  Weight:      Height:        Intake/Output Summary (Last 24 hours) at 12/04/16 0854 Last data filed at 12/04/16 0500  Gross per 24 hour  Intake              200 ml  Output              575 ml  Net             -375 ml   Filed Weights   11/19/16 0549 12/02/16 0500 12/03/16 0413  Weight: 116.2 kg (256 lb 1.6 oz) (!) 156.9 kg (346 lb) (!) 158.3 kg (349 lb)    Examination:   General: Pt is alert, awake, not in acute distress Cardiovascular: RRR, S1/S2  Respiratory: CTA bilaterally Abdominal: Soft, NT ND Extremities: Bilateral leg ulcers with surrounding erythema mildly improved  Data Reviewed: I have personally reviewed following labs and imaging studies  CBC:  Recent Labs Lab 11/27/16 1419 11/28/16 0356 11/29/16 0525 12/01/16 0459  WBC 12.5* 11.7* 11.1* 13.5*  HGB 7.8* 7.5* 7.0* 7.6*  HCT 24.5* 24.2* 22.8* 24.5*  MCV 80.3 80.1 80.9 80.1  PLT 536* 485* 396 161   Basic Metabolic Panel:  Recent Labs Lab 11/27/16 1419 11/28/16 0356 11/29/16 0525 12/01/16 0459  NA 144 145 146*  145  K 4.5 4.5 3.4* 3.6  CL 125* 123* 127* 123*  CO2 15* 15* 12* 16*  GLUCOSE 128* 134* 77 69  BUN 50* 55* 46* 48*  CREATININE 2.37* 2.41* 1.98* 1.90*  CALCIUM 8.2* 8.4* 7.0* 7.7*   GFR: Estimated Creatinine Clearance: 37.5 mL/min (A) (by C-G formula based on SCr of 1.9 mg/dL (H)). Liver Function Tests: No results for input(s): AST, ALT, ALKPHOS, BILITOT, PROT, ALBUMIN in the last 168 hours. No results for input(s): LIPASE, AMYLASE in the last 168 hours. No results for input(s): AMMONIA in the last 168 hours. Coagulation Profile: No results for input(s): INR, PROTIME in the last 168 hours. Cardiac Enzymes: No results for input(s): CKTOTAL, CKMB, CKMBINDEX, TROPONINI in the last 168 hours. BNP (  last 3 results) No results for input(s): PROBNP in the last 8760 hours. HbA1C: No results for input(s): HGBA1C in the last 72 hours. CBG: No results for input(s): GLUCAP in the last 168 hours. Lipid Profile: No results for input(s): CHOL, HDL, LDLCALC, TRIG, CHOLHDL, LDLDIRECT in the last 72 hours. Thyroid Function Tests: No results for input(s): TSH, T4TOTAL, FREET4, T3FREE, THYROIDAB in the last 72 hours. Anemia Panel: No results for input(s): VITAMINB12, FOLATE, FERRITIN, TIBC, IRON, RETICCTPCT in the last 72 hours. Sepsis Labs: No results for input(s): PROCALCITON, LATICACIDVEN in the last 168 hours.  No results found for this or any previous visit (from the past 240 hour(s)).    Radiology Studies: Ir Fluoro Guide Cv Line Right  Result Date: 12/02/2016 INDICATION: Sacral decubitus ulcer with infection, access for antibiotics EXAM: ULTRASOUND AND FLUOROSCOPIC RIGHT IJ DOUBLE-LUMEN TUNNELED PICC LINE INSERTION MEDICATIONS: 1% LIDOCAINE LOCALLY CONTRAST:  None FLUOROSCOPY TIME:  Six seconds (1 mGy) COMPLICATIONS: None immediate. TECHNIQUE: The procedure, risks, benefits, and alternatives were explained to the patient and informed written consent was obtained. A timeout was performed  prior to the initiation of the procedure. The right neck and chest were prepped with chlorhexidine in a sterile fashion, and a sterile drape was applied covering the operative field. Maximum barrier sterile technique with sterile gowns and gloves were used for the procedure. A timeout was performed prior to the initiation of the procedure. Local anesthesia was provided with 1% lidocaine. Under direct ultrasound guidance, the right internal jugular vein was accessed with a micropuncture kit after the overlying soft tissues were anesthetized with 1% lidocaine. An ultrasound image was saved for documentation purposes. A guidewire was advanced to the level of the superior caval-atrial junction for measurement purposes and the PICC line was cut to length. In the right chest, a subcutaneous tunnel was created under sterile conditions and local anesthesia. The 6 French double-lumen cuffed PICC line was tunneled subcutaneously to the venotomy site. A peel-away sheath was placed and a 21 cm, 6 [place], dual lumen was inserted to level of the superior caval-atrial junction. A post procedure spot fluoroscopic was obtained. The catheter easily aspirated and flushed and was sutured in place. Venotomy site closed with derma bond. A dressing was placed. The patient tolerated the procedure well without immediate post procedural complication. FINDINGS: After catheter placement, the tip lies within the superior cavoatrial junction. The catheter aspirates and flushes normally and is ready for immediate use. IMPRESSION: Successful ultrasound and fluoroscopic guided placement of a right internal jugular vein approach, 21 cm, 6 French, double-lumen tunneled power PICC with tip at the superior caval-atrial junction. The PICC line is ready for immediate use. Electronically Signed   By: Jerilynn Mages.  Shick M.D.   On: 12/02/2016 15:25   Ir US Guide Vasc Access Right  Result Date: 12/02/2016 INDICATION: Sacral decubitus ulcer with infection, access  for antibiotics EXAM: ULTRASOUND AND FLUOROSCOPIC RIGHT IJ DOUBLE-LUMEN TUNNELED PICC LINE INSERTION MEDICATIONS: 1% LIDOCAINE LOCALLY CONTRAST:  None FLUOROSCOPY TIME:  Six seconds (1 mGy) COMPLICATIONS: None immediate. TECHNIQUE: The procedure, risks, benefits, and alternatives were explained to the patient and informed written consent was obtained. A timeout was performed prior to the initiation of the procedure. The right neck and chest were prepped with chlorhexidine in a sterile fashion, and a sterile drape was applied covering the operative field. Maximum barrier sterile technique with sterile gowns and gloves were used for the procedure. A timeout was performed prior to the initiation of the procedure. Local  anesthesia was provided with 1% lidocaine. Under direct ultrasound guidance, the right internal jugular vein was accessed with a micropuncture kit after the overlying soft tissues were anesthetized with 1% lidocaine. An ultrasound image was saved for documentation purposes. A guidewire was advanced to the level of the superior caval-atrial junction for measurement purposes and the PICC line was cut to length. In the right chest, a subcutaneous tunnel was created under sterile conditions and local anesthesia. The 6 French double-lumen cuffed PICC line was tunneled subcutaneously to the venotomy site. A peel-away sheath was placed and a 21 cm, 6 [place], dual lumen was inserted to level of the superior caval-atrial junction. A post procedure spot fluoroscopic was obtained. The catheter easily aspirated and flushed and was sutured in place. Venotomy site closed with derma bond. A dressing was placed. The patient tolerated the procedure well without immediate post procedural complication. FINDINGS: After catheter placement, the tip lies within the superior cavoatrial junction. The catheter aspirates and flushes normally and is ready for immediate use. IMPRESSION: Successful ultrasound and fluoroscopic guided  placement of a right internal jugular vein approach, 21 cm, 6 French, double-lumen tunneled power PICC with tip at the superior caval-atrial junction. The PICC line is ready for immediate use. Electronically Signed   By: Jerilynn Mages.  Shick M.D.   On: 12/02/2016 15:25      Scheduled Meds: . aspirin EC  81 mg Oral Daily  . atorvastatin  10 mg Oral Daily  . clopidogrel  75 mg Oral Q breakfast  . docusate sodium  100 mg Oral BID  . enoxaparin (LOVENOX) injection  40 mg Subcutaneous Q24H  . feeding supplement (GLUCERNA SHAKE)  237 mL Oral BID BM  . feeding supplement (PRO-STAT SUGAR FREE 64)  30 mL Oral BID  . ferrous sulfate  325 mg Oral Q breakfast  . metoprolol tartrate  12.5 mg Oral BID  . metroNIDAZOLE  500 mg Oral Q8H  . multivitamin with minerals  1 tablet Oral Daily  . polyethylene glycol  17 g Oral Daily  . rivastigmine  13.3 mg Transdermal Daily   Continuous Infusions: . cefTRIAXone (ROCEPHIN)  IV Stopped (12/03/16 2149)     LOS: 17 days    Time spent: Total of 15 minutes spent with pt, greater than 50% of which was spent in discussion of  treatment, counseling and coordination of care    Chipper Oman, MD Pager: Text Page via [email or web address]   If 7PM-7AM, please contact night-coverage [email or web address] 12/04/2016, 8:54 AM

## 2016-12-04 NOTE — Progress Notes (Signed)
This RN attempted to change dressings on the left calf and right heel. Patient was given tylenol and a one time order 2 mg morphine prior to changing the dressing. Patient refused to have the dressings change stating that, "it hurts". This RN explained to the patient of the importance of changing the dressing but the patient still refused. The patient stated that the pain medicine didn't work for her. This RN told her that the dressings would have to be changed in the morning. Patient understood and agreed. This RN will pass along to day shift to have the MD discuss pain medicine for dressing changes besides tylenol. Will continue to monitor.

## 2016-12-05 DIAGNOSIS — I10 Essential (primary) hypertension: Secondary | ICD-10-CM

## 2016-12-05 DIAGNOSIS — E119 Type 2 diabetes mellitus without complications: Secondary | ICD-10-CM

## 2016-12-05 MED ORDER — METRONIDAZOLE 500 MG PO TABS: 500.0000 mg | ORAL_TABLET | Freq: Three times a day (TID) | ORAL | Status: AC

## 2016-12-05 MED ORDER — METOPROLOL TARTRATE 25 MG PO TABS: 12.5000 mg | ORAL_TABLET | Freq: Two times a day (BID) | ORAL | Status: DC

## 2016-12-05 MED ORDER — CEFTRIAXONE IV (FOR PTA / DISCHARGE USE ONLY): 2.0000 g | INTRAVENOUS | 0 refills | Status: AC

## 2016-12-05 MED ORDER — HEPARIN SOD (PORK) LOCK FLUSH 100 UNIT/ML IV SOLN
250.0000 [IU] | INTRAVENOUS | Status: AC | PRN
  Administered 2016-12-05: 250 [IU]

## 2016-12-05 MED ORDER — FENTANYL CITRATE (PF) 100 MCG/2ML IJ SOLN
12.5000 ug | Freq: Once | INTRAMUSCULAR | Status: AC
  Administered 2016-12-05: 12.5 ug via INTRAVENOUS
  Filled 2016-12-05: qty 2

## 2016-12-05 MED ORDER — SODIUM CHLORIDE 0.9 % IV SOLN
125.0000 mg | Freq: Once | INTRAVENOUS | Status: AC
  Administered 2016-12-05: 125 mg via INTRAVENOUS
  Filled 2016-12-05: qty 10

## 2016-12-05 MED ORDER — OXYCODONE-ACETAMINOPHEN 5-325 MG PO TABS: 1.0000 | ORAL_TABLET | ORAL | 0 refills | Status: DC | PRN

## 2016-12-05 NOTE — Progress Notes (Signed)
Bear hugger turned off. Pt stable with no complaints. Oral temperature 98.3. Will continue to monitor.   Versie StarksHanna  Jae Skeet, RN

## 2016-12-05 NOTE — Progress Notes (Signed)
Clinical Social Worker facilitated patient discharge including contacting patient family and facility to confirm patient discharge plans.  Clinical information faxed to facility and family agreeable with plan.  CSW arranged ambulance transport via [facility] to [facility] .  RN to call [phone] for report prior to discharge.  Clinical Social Worker will sign off for now as social work intervention is no longer needed. Please consult us again if new need arises.  Yvonne Stopher, MSW, LCSWA [phone]  

## 2016-12-05 NOTE — Care Management Important Message (Signed)
Important Message  Patient Details  Name: Sylvia Medina MRN: [id number] Date of Birth: 03/07/1935   Medicare Important Message Given:  Yes    [name] Abena 12/05/2016, 11:21 AM

## 2016-12-05 NOTE — Progress Notes (Signed)
PHARMACY CONSULT NOTE FOR:  OUTPATIENT  PARENTERAL ANTIBIOTIC THERAPY (OPAT)  Indication: Wound infection Regimen: Ceftriaxone 2G IV QD + Flagyl 500mg  PO q8 hours End date: 12/29/16  IV antibiotic discharge orders are pended. To discharging provider:  please sign these orders via discharge navigator,  Select New Orders & click on the button choice - Manage This Unsigned Work.     Thank you for allowing pharmacy to be a part of this patient's care.  Sylvia Medina 12/05/2016, 1:00 PM

## 2016-12-05 NOTE — Progress Notes (Signed)
Chart reviewed for LOS; B Eris Breck RN,MHA,BSN [phone] 

## 2016-12-05 NOTE — Discharge Summary (Signed)
Physician Discharge Summary  DEANGELA RANDLEMAN  ATF:[id number]  DOB: [birth year]  DOA: 11/17/2016 PCP: System, Pcp Not In  Admit date: 11/17/2016 Discharge date: 12/05/2016   Admitted From: SNF Disposition:  SNF   Recommendations for Outpatient Follow-up:  1. Follow up with PCP in 1 week  2. Please obtain BMP/CBC in one week to monitor renal function and hemoglobin 3. Recommend palliative care consult as an outpatient 4. Wound care consult 5. Antibiotic therapy for 6 weeks.  6. Remove PICC line after abx completion . 7. Give percocet 30 minutes prior to dressing changes  Discharge Condition: Fair  CODE STATUS: Full  Diet recommendation: Heart Healthy   Brief/Interim Summary: For full details see H&P/progress note but in brief, Taziah Difatta 81 year old female with history of dementia, PVD, hyperthyroidism, diabetes mellitus, chronic kidney disease and history of CVA. Presented to the emergency department after being sent from SNF with elevated sodium and infected left leg. Shortly after admission patient became hypothermic and was transferred to stepdown unit. Patient was started on IV broad-spectrum antibiotics for her lower extremity ulcers. Orthopedic surgery was consulted and recommended amputation, however family is deferred in this for now. Vascular surgery was also consulted which did have no recommendations at this point. Case was discussed with infectious disease who recommended 6 week of ceftriaxone/Flagyl. Patient was evaluated by palliative care team but family does not want to pursue aggressive/surgical treatment for now. Patient also was found to be anemic. 1 unit of PRBC's was going to be transfused but due to hypothermia this was cancelled.  Hemoglobin now remains stable.  Please patient will remain ceftriaxone/Flagyl for 6 weeks.  Patient intermittent hypothermia felt to be due to infectious process, discussed with family that these might continue treatment: As  recommendation is to remove source of infection with surgical treatment.  They understand and will continue IV antibiotics.. Patient seems to be stable for discharge to nursing home with daily dressing changes  Subjective: Patient seen and examined on the day of discharge.  She reports feeling well with acute symptoms at night.  Patient with normal compatible for > 48 hours.  Denies chest pain, treatment of breath, palpitation and dizziness.  Discharge Diagnoses/Hospital Course:  Bilateral lower extremity ulcers - infected In setting of diabetes and moderate to severe PVD bilateral. Orthopedic surgery consulted and recommended amputation. Family opted to defer this option for now. Vascular surgery with no recommendations. ID recommending ceftriaxone/Friday for 4-6 week at SNF. This alternative is likely to be temporary as patient has dementia is not ambulatory and with unhealed ulcer/skin breakdown. Pain medications as needed for dressing changes, will give percocet 30 minutes prior to dressing changes. Hypothermia is related to this infection which abx therapy may not been enough. Case discussed with daughter, they are aware that abx may not work but they want to try. Explained that intermittent hypothermia and hypotension may be a signs of abx failure but they wish to procedure with current treatment at this time. No interest on surgical intervention.  PICC line placed. Patient can be discharge to SNF when bed available on currents abx. Daily dressing changes.    Chronic kidney disease stage IV Cr improved, seems to be below previous baseline  Check Cr in 1 week   Hypothermia - resolve for > 48 hrs This may be a signs of current infection, unclear if just antibiotic is taking care of infected ulcers.  Discussed with family that antibiotics may be a temporary solution.  They understand  and will continue to proceed with antibiotic therapy.  They understand that hypothermia could be recurrent due to  current infection.   Peripheral vascular disease Not candidate for surgical intervention Continue statin, aspirin and Plavix  Hypertension Blood Pressure stable Continue Lopressor  Anemia of chronic disease - PRBC's was held last night due to low temp's  Likely due to chronic kidney disease Anemia panel shows Iron deficiency - IV iron given  Hemoglobin stable upon discharge No signs of overt bleeding  Check CBC in 1 week  All other chronic medical condition were stable during the hospitalization.  Patient was seen by physical therapy, recommending SNF  On the day of the discharge the patient's vitals were stable, and no other acute medical condition were reported by patient. Patient was felt safe to be discharge to SNF   Discharge Instructions  You were cared for by a hospitalist during your hospital stay. If you have any questions about your discharge medications or the care you received while you were in the hospital after you are discharged, you can call the unit and asked to speak with the hospitalist on call if the hospitalist that took care of you is not available. Once you are discharged, your primary care physician will handle any further medical issues. Please note that NO REFILLS for any discharge medications will be authorized once you are discharged, as it is imperative that you return to your primary care physician (or establish a relationship with a primary care physician if you do not have one) for your aftercare needs so that they can reassess your need for medications and monitor your lab values.  Discharge Instructions    Call MD for:  difficulty breathing, headache or visual disturbances    Complete by:  As directed    Call MD for:  extreme fatigue    Complete by:  As directed    Call MD for:  hives    Complete by:  As directed    Call MD for:  persistant dizziness or light-headedness    Complete by:  As directed    Call MD for:  persistant nausea and vomiting     Complete by:  As directed    Call MD for:  redness, tenderness, or signs of infection (pain, swelling, redness, odor or green/yellow discharge around incision site)    Complete by:  As directed    Call MD for:  severe uncontrolled pain    Complete by:  As directed    Call MD for:  temperature >100.4    Complete by:  As directed    Diet - low sodium heart healthy    Complete by:  As directed    Home infusion instructions [place] May follow [place] Dosing Protocol; May administer Cathflo as needed to maintain patency of vascular access device.; Flushing of vascular access device: per [facility] Protocol: 0.9% NaCl pre/post medica...    Complete by:  As directed    Instructions:  May follow [place] Dosing Protocol   Instructions:  May administer Cathflo as needed to maintain patency of vascular access device.   Instructions:  Flushing of vascular access device: per [facility] Protocol: 0.9% NaCl pre/post medication administration and prn patency; Heparin 100 u/ml, 68m for implanted ports and Heparin 10u/ml, 565mfor all other central venous catheters.   Instructions:  May follow [facility] Anaphylaxis Protocol for First Dose Administration in the home: 0.9% NaCl at 25-50 ml/hr to maintain IV access for protocol meds. Epinephrine  0.3 ml IV/IM PRN and Benadryl 25-50 IV/IM PRN s/s of anaphylaxis.   Instructions:  [facility] Infusion Coordinator (RN) to assist per patient IV care needs in the home PRN.   Increase activity slowly    Complete by:  As directed      Allergies as of 12/05/2016   No Known Allergies     Medication List    STOP taking these medications   diclofenac sodium 1 % Gel Commonly known as:  VOLTAREN   hydrochlorothiazide 12.5 MG tablet Commonly known as:  HYDRODIURIL     TAKE these medications   aspirin EC 81 MG tablet Take 1 tablet (81 mg total) by mouth daily.   atorvastatin 10 MG tablet Commonly known as:  LIPITOR Take 1 tablet (10 mg total) by mouth  daily.   cefTRIAXone IVPB Commonly known as:  ROCEPHIN Inject 2 g into the vein daily. Indication:  Wound infection Last Day of Therapy:  12/29/16 Labs - Once weekly:  CBC/D and BMP, Labs - Every other week:  ESR and CRP   Cholecalciferol 1000 units tablet Take 1 tablet (1,000 Units total) by mouth daily.   clopidogrel 75 MG tablet Commonly known as:  PLAVIX Take 1 tablet (75 mg total) by mouth daily with breakfast.   collagenase ointment Commonly known as:  SANTYL Apply 1 application topically daily.   feeding supplement (PRO-STAT SUGAR FREE 64) Liqd Take 30 mLs by mouth 2 (two) times daily as needed.   ferrous sulfate 325 (65 FE) MG tablet Take 325 mg by mouth daily with breakfast.   methimazole 10 MG tablet Commonly known as:  TAPAZOLE Take 10 mg by mouth daily.   metoprolol tartrate 25 MG tablet Commonly known as:  LOPRESSOR Take 0.5 tablets (12.5 mg total) by mouth 2 (two) times daily. What changed:  medication strength  how much to take  additional instructions   metroNIDAZOLE 500 MG tablet Commonly known as:  FLAGYL Take 1 tablet (500 mg total) by mouth 3 (three) times daily.   multivitamin tablet Take 1 tablet by mouth daily.   oxyCODONE-acetaminophen 5-325 MG tablet Commonly known as:  ROXICET Take 1 tablet by mouth every 4 (four) hours as needed for severe pain (Prior to dressing changes 30 minutes before).   polyethylene glycol powder powder Commonly known as:  GLYCOLAX/MIRALAX Take 17 g by mouth daily. Mix in 4-8 ounces of Orange Juice and take by mouth daily for constipation.   rivastigmine 13.3 MG/24HR Commonly known as:  EXELON Place 1 patch (13.3 mg total) onto the skin daily.            Home Infusion Instuctions        Start     Ordered   12/05/16 0000  Home infusion instructions [facility] May follow [place] Dosing Protocol; May administer Cathflo as needed to maintain patency of vascular access device.; Flushing of  vascular access device: per [facility] Protocol: 0.9% NaCl pre/post medica...    Question Answer Comment  Instructions May follow [place] Dosing Protocol   Instructions May administer Cathflo as needed to maintain patency of vascular access device.   Instructions Flushing of vascular access device: per [facility] Protocol: 0.9% NaCl pre/post medication administration and prn patency; Heparin 100 u/ml, 107m for implanted ports and Heparin 10u/ml, 516mfor all other central venous catheters.   Instructions May follow [facility] Anaphylaxis Protocol for First Dose Administration in the home: 0.9% NaCl at 25-50 ml/hr to maintain IV access for  protocol meds. Epinephrine 0.3 ml IV/IM PRN and Benadryl 25-50 IV/IM PRN s/s of anaphylaxis.   Instructions [facility] Infusion Coordinator (RN) to assist per patient IV care needs in the home PRN.      12/05/16 1342     Contact information for after-discharge care    Destination    [facility] Follow up.   Specialty:  [place] information: [address] [place] [phone]             No Known Allergies  Consultations:  Orthopedic surgery: Dr Sharol Given   Procedures/Studies: Dg Tibia/fibula Left  Result Date: 11/17/2016 CLINICAL DATA:  Leg wound, ulcer EXAM: LEFT TIBIA AND FIBULA - 2 VIEW COMPARISON:  None. FINDINGS: No fracture is seen. Large soft tissue ulceration distal lateral aspect of the left lower leg. Diffuse osteopenia. Possible cortical erosion distal anterior aspect of the tibia at the ankle joint. IMPRESSION: 1. Diffuse soft tissue swelling with large soft tissue deformity at the mid to distal lower leg on the lateral side. 2. Possible cortical erosive change at the distal anterior tibia at the ankle joint, cannot exclude small focus of osteomyelitis. Electronically Signed   By: Donavan Foil M.D.   On: 11/17/2016 19:55   Ir Fluoro Guide Cv Line Right  Result Date:  12/02/2016 INDICATION: Sacral decubitus ulcer with infection, access for antibiotics EXAM: ULTRASOUND AND FLUOROSCOPIC RIGHT IJ DOUBLE-LUMEN TUNNELED PICC LINE INSERTION MEDICATIONS: 1% LIDOCAINE LOCALLY CONTRAST:  None FLUOROSCOPY TIME:  Six seconds (1 mGy) COMPLICATIONS: None immediate. TECHNIQUE: The procedure, risks, benefits, and alternatives were explained to the patient and informed written consent was obtained. A timeout was performed prior to the initiation of the procedure. The right neck and chest were prepped with chlorhexidine in a sterile fashion, and a sterile drape was applied covering the operative field. Maximum barrier sterile technique with sterile gowns and gloves were used for the procedure. A timeout was performed prior to the initiation of the procedure. Local anesthesia was provided with 1% lidocaine. Under direct ultrasound guidance, the right internal jugular vein was accessed with a micropuncture kit after the overlying soft tissues were anesthetized with 1% lidocaine. An ultrasound image was saved for documentation purposes. A guidewire was advanced to the level of the superior caval-atrial junction for measurement purposes and the PICC line was cut to length. In the right chest, a subcutaneous tunnel was created under sterile conditions and local anesthesia. The 6 French double-lumen cuffed PICC line was tunneled subcutaneously to the venotomy site. A peel-away sheath was placed and a 21 cm, 6 [place], dual lumen was inserted to level of the superior caval-atrial junction. A post procedure spot fluoroscopic was obtained. The catheter easily aspirated and flushed and was sutured in place. Venotomy site closed with derma bond. A dressing was placed. The patient tolerated the procedure well without immediate post procedural complication. FINDINGS: After catheter placement, the tip lies within the superior cavoatrial junction. The catheter aspirates and flushes normally and is ready for  immediate use. IMPRESSION: Successful ultrasound and fluoroscopic guided placement of a right internal jugular vein approach, 21 cm, 6 French, double-lumen tunneled power PICC with tip at the superior caval-atrial junction. The PICC line is ready for immediate use. Electronically Signed   By: Jerilynn Mages.  Shick M.D.   On: 12/02/2016 15:25   Ir US Guide Vasc Access Right  Result Date: 12/02/2016 INDICATION: Sacral decubitus ulcer with infection, access for antibiotics EXAM: ULTRASOUND AND FLUOROSCOPIC RIGHT  IJ DOUBLE-LUMEN TUNNELED PICC LINE INSERTION MEDICATIONS: 1% LIDOCAINE LOCALLY CONTRAST:  None FLUOROSCOPY TIME:  Six seconds (1 mGy) COMPLICATIONS: None immediate. TECHNIQUE: The procedure, risks, benefits, and alternatives were explained to the patient and informed written consent was obtained. A timeout was performed prior to the initiation of the procedure. The right neck and chest were prepped with chlorhexidine in a sterile fashion, and a sterile drape was applied covering the operative field. Maximum barrier sterile technique with sterile gowns and gloves were used for the procedure. A timeout was performed prior to the initiation of the procedure. Local anesthesia was provided with 1% lidocaine. Under direct ultrasound guidance, the right internal jugular vein was accessed with a micropuncture kit after the overlying soft tissues were anesthetized with 1% lidocaine. An ultrasound image was saved for documentation purposes. A guidewire was advanced to the level of the superior caval-atrial junction for measurement purposes and the PICC line was cut to length. In the right chest, a subcutaneous tunnel was created under sterile conditions and local anesthesia. The 6 French double-lumen cuffed PICC line was tunneled subcutaneously to the venotomy site. A peel-away sheath was placed and a 21 cm, 6 [place], dual lumen was inserted to level of the superior caval-atrial junction. A post procedure spot fluoroscopic was  obtained. The catheter easily aspirated and flushed and was sutured in place. Venotomy site closed with derma bond. A dressing was placed. The patient tolerated the procedure well without immediate post procedural complication. FINDINGS: After catheter placement, the tip lies within the superior cavoatrial junction. The catheter aspirates and flushes normally and is ready for immediate use. IMPRESSION: Successful ultrasound and fluoroscopic guided placement of a right internal jugular vein approach, 21 cm, 6 French, double-lumen tunneled power PICC with tip at the superior caval-atrial junction. The PICC line is ready for immediate use. Electronically Signed   By: Jerilynn Mages.  Shick M.D.   On: 12/02/2016 15:25   Dg Foot Complete Right  Result Date: 11/17/2016 CLINICAL DATA:  Foot ulcer EXAM: RIGHT FOOT COMPLETE - 3+ VIEW COMPARISON:  None. FINDINGS: The examination is limited by positioning and severe osteopenia. This limits detection for fracture. No gross acute displaced fracture or malalignment is seen. No definitive periostitis or obvious bone destruction. No soft tissue gas IMPRESSION: Marked osteopenia limits the study. No gross acute osseous abnormality Electronically Signed   By: Donavan Foil M.D.   On: 11/17/2016 19:52    Discharge Exam: Vitals:   12/05/16 1048 12/05/16 1200  BP:  (!) 139/115  Pulse:  84  Resp:  (!) 24  Temp: 98.3 F (36.8 C) (!) 97.4 F (36.3 C)  SpO2:  100%   Vitals:   12/05/16 0800 12/05/16 0920 12/05/16 1048 12/05/16 1200  BP: 126/61 139/60  (!) 139/115  Pulse: 82 85  84  Resp: (!) 28   (!) 24  Temp: 97.6 F (36.4 C)  98.3 F (36.8 C) (!) 97.4 F (36.3 C)  TempSrc: Axillary  Oral Oral  SpO2: 100%   100%  Weight:      Height:        General: Pt is alert, awake, not in acute distress Cardiovascular: RRR, S1/S2 +, no rubs, no gallops Respiratory: CTA bilaterally, no wheezing, no rhonchi Abdominal: Soft, NT, ND, bowel sounds + Extremities: Bilateral leg ulcers  with surrounding erythema mildly improved   The results of significant diagnostics from this hospitalization (including imaging, microbiology, ancillary and laboratory) are listed below for reference.     Microbiology: No  results found for this or any previous visit (from the past 240 hour(s)).   Labs: BNP (last 3 results) No results for input(s): BNP in the last 8760 hours. Basic Metabolic Panel:  Recent Labs Lab 11/29/16 0525 12/01/16 0459 12/04/16 1216  NA 146* 145 148*  K 3.4* 3.6 3.5  CL 127* 123* 124*  CO2 12* 16* 20*  GLUCOSE 77 69 132*  BUN 46* 48* 33*  CREATININE 1.98* 1.90* 1.72*  CALCIUM 7.0* 7.7* 7.9*   Liver Function Tests: No results for input(s): AST, ALT, ALKPHOS, BILITOT, PROT, ALBUMIN in the last 168 hours. No results for input(s): LIPASE, AMYLASE in the last 168 hours. No results for input(s): AMMONIA in the last 168 hours. CBC:  Recent Labs Lab 11/29/16 0525 12/01/16 0459 12/04/16 1216  WBC 11.1* 13.5* 11.8*  HGB 7.0* 7.6* 7.5*  HCT 22.8* 24.5* 24.8*  MCV 80.9 80.1 81.6  PLT 396 298 297   Cardiac Enzymes: No results for input(s): CKTOTAL, CKMB, CKMBINDEX, TROPONINI in the last 168 hours. BNP: Invalid input(s): POCBNP CBG:  Recent Labs Lab 12/04/16 2115  GLUCAP 140*   D-Dimer No results for input(s): DDIMER in the last 72 hours. Hgb A1c No results for input(s): HGBA1C in the last 72 hours. Lipid Profile No results for input(s): CHOL, HDL, LDLCALC, TRIG, CHOLHDL, LDLDIRECT in the last 72 hours. Thyroid function studies No results for input(s): TSH, T4TOTAL, T3FREE, THYROIDAB in the last 72 hours.  Invalid input(s): FREET3 Anemia work up No results for input(s): VITAMINB12, FOLATE, FERRITIN, TIBC, IRON, RETICCTPCT in the last 72 hours. Urinalysis    Component Value Date/Time   COLORURINE YELLOW 11/17/2016 1808   APPEARANCEUR HAZY (A) 11/17/2016 1808   LABSPEC 1.012 11/17/2016 1808   PHURINE 6.0 11/17/2016 1808   GLUCOSEU  NEGATIVE 11/17/2016 1808   HGBUR MODERATE (A) 11/17/2016 1808   BILIRUBINUR NEGATIVE 11/17/2016 1808   KETONESUR NEGATIVE 11/17/2016 1808   PROTEINUR 30 (A) 11/17/2016 1808   NITRITE POSITIVE (A) 11/17/2016 1808   LEUKOCYTESUR LARGE (A) 11/17/2016 1808   Sepsis Labs Invalid input(s): PROCALCITONIN,  WBC,  LACTICIDVEN Microbiology No results found for this or any previous visit (from the past 240 hour(s)).   Time coordinating discharge: 35 minutes  SIGNED:  Chipper Oman, MD  [facility] 12/05/2016, 1:42 PM  Pager please text page via  [email or web address] Password [email or web address]

## 2016-12-16 ENCOUNTER — Encounter (HOSPITAL_COMMUNITY): Payer: Medicare Other

## 2016-12-16 ENCOUNTER — Ambulatory Visit: Payer: Medicare Other | Admitting: Vascular Surgery

## 2016-12-20 ENCOUNTER — Observation Stay (HOSPITAL_COMMUNITY)
Admission: EM | Admit: 2016-12-20 | Discharge: 2016-12-22 | Disposition: A | Discharge status: Skilled Nursing Facility | Payer: Medicare Other | Attending: Family Medicine | Admitting: Family Medicine

## 2016-12-20 ENCOUNTER — Encounter (HOSPITAL_COMMUNITY): Payer: Self-pay | Admitting: Emergency Medicine

## 2016-12-20 DIAGNOSIS — I639 Cerebral infarction, unspecified: Secondary | ICD-10-CM | POA: Diagnosis present

## 2016-12-20 DIAGNOSIS — N179 Acute kidney failure, unspecified: Secondary | ICD-10-CM | POA: Diagnosis not present

## 2016-12-20 DIAGNOSIS — E871 Hypo-osmolality and hyponatremia: Secondary | ICD-10-CM | POA: Diagnosis not present

## 2016-12-20 DIAGNOSIS — G309 Alzheimer's disease, unspecified: Secondary | ICD-10-CM | POA: Insufficient documentation

## 2016-12-20 DIAGNOSIS — Z8673 Personal history of transient ischemic attack (TIA), and cerebral infarction without residual deficits: Secondary | ICD-10-CM | POA: Insufficient documentation

## 2016-12-20 DIAGNOSIS — Z7982 Long term (current) use of aspirin: Secondary | ICD-10-CM | POA: Diagnosis not present

## 2016-12-20 DIAGNOSIS — M8660 Other chronic osteomyelitis, unspecified site: Secondary | ICD-10-CM | POA: Insufficient documentation

## 2016-12-20 DIAGNOSIS — E1122 Type 2 diabetes mellitus with diabetic chronic kidney disease: Secondary | ICD-10-CM | POA: Insufficient documentation

## 2016-12-20 DIAGNOSIS — E1169 Type 2 diabetes mellitus with other specified complication: Secondary | ICD-10-CM | POA: Diagnosis not present

## 2016-12-20 DIAGNOSIS — Z7902 Long term (current) use of antithrombotics/antiplatelets: Secondary | ICD-10-CM | POA: Diagnosis not present

## 2016-12-20 DIAGNOSIS — I10 Essential (primary) hypertension: Secondary | ICD-10-CM | POA: Diagnosis present

## 2016-12-20 DIAGNOSIS — I129 Hypertensive chronic kidney disease with stage 1 through stage 4 chronic kidney disease, or unspecified chronic kidney disease: Secondary | ICD-10-CM | POA: Diagnosis not present

## 2016-12-20 DIAGNOSIS — E0590 Thyrotoxicosis, unspecified without thyrotoxic crisis or storm: Secondary | ICD-10-CM | POA: Insufficient documentation

## 2016-12-20 DIAGNOSIS — D638 Anemia in other chronic diseases classified elsewhere: Secondary | ICD-10-CM | POA: Diagnosis not present

## 2016-12-20 DIAGNOSIS — E876 Hypokalemia: Secondary | ICD-10-CM | POA: Diagnosis not present

## 2016-12-20 DIAGNOSIS — F0280 Dementia in other diseases classified elsewhere without behavioral disturbance: Secondary | ICD-10-CM | POA: Diagnosis not present

## 2016-12-20 DIAGNOSIS — M869 Osteomyelitis, unspecified: Secondary | ICD-10-CM | POA: Diagnosis present

## 2016-12-20 DIAGNOSIS — G300 Alzheimer's disease with early onset: Secondary | ICD-10-CM

## 2016-12-20 DIAGNOSIS — M8639 Chronic multifocal osteomyelitis, multiple sites: Secondary | ICD-10-CM | POA: Diagnosis not present

## 2016-12-20 DIAGNOSIS — E870 Hyperosmolality and hypernatremia: Principal | ICD-10-CM | POA: Diagnosis present

## 2016-12-20 DIAGNOSIS — E860 Dehydration: Secondary | ICD-10-CM | POA: Insufficient documentation

## 2016-12-20 DIAGNOSIS — Z79899 Other long term (current) drug therapy: Secondary | ICD-10-CM | POA: Insufficient documentation

## 2016-12-20 DIAGNOSIS — N184 Chronic kidney disease, stage 4 (severe): Secondary | ICD-10-CM | POA: Diagnosis present

## 2016-12-20 DIAGNOSIS — E559 Vitamin D deficiency, unspecified: Secondary | ICD-10-CM | POA: Diagnosis present

## 2016-12-20 DIAGNOSIS — Z87891 Personal history of nicotine dependence: Secondary | ICD-10-CM | POA: Insufficient documentation

## 2016-12-20 DIAGNOSIS — E119 Type 2 diabetes mellitus without complications: Secondary | ICD-10-CM

## 2016-12-20 LAB — COMPREHENSIVE METABOLIC PANEL
ALK PHOS: 325 U/L — AB (ref 38–126)
ALT: 17 U/L (ref 14–54)
ANION GAP: 9 (ref 5–15)
AST: 40 U/L (ref 15–41)
Albumin: 2 g/dL — ABNORMAL LOW (ref 3.5–5.0)
BILIRUBIN TOTAL: 0.2 mg/dL — AB (ref 0.3–1.2)
BUN: 31 mg/dL — ABNORMAL HIGH (ref 6–20)
CALCIUM: 8.6 mg/dL — AB (ref 8.9–10.3)
CO2: 21 mmol/L — ABNORMAL LOW (ref 22–32)
Chloride: 124 mmol/L — ABNORMAL HIGH (ref 101–111)
Creatinine, Ser: 1.94 mg/dL — ABNORMAL HIGH (ref 0.44–1.00)
GFR calc non Af Amer: 23 mL/min — ABNORMAL LOW (ref >60)
GFR, EST AFRICAN AMERICAN: 27 mL/min — AB (ref >60)
Glucose, Bld: 85 mg/dL (ref 65–99)
Potassium: 3.3 mmol/L — ABNORMAL LOW (ref 3.5–5.1)
Sodium: 154 mmol/L — ABNORMAL HIGH (ref 135–145)
TOTAL PROTEIN: 5.9 g/dL — AB (ref 6.5–8.1)

## 2016-12-20 LAB — CBC WITH DIFFERENTIAL/PLATELET
BASOS PCT: 0 %
Basophils Absolute: 0 10*3/uL (ref 0.0–0.1)
Eosinophils Absolute: 0.2 10*3/uL (ref 0.0–0.7)
Eosinophils Relative: 3 %
HEMATOCRIT: 29.4 % — AB (ref 36.0–46.0)
HEMOGLOBIN: 8.9 g/dL — AB (ref 12.0–15.0)
LYMPHS PCT: 40 %
Lymphs Abs: 2.8 10*3/uL (ref 0.7–4.0)
MCH: 25.8 pg — AB (ref 26.0–34.0)
MCHC: 30.3 g/dL (ref 30.0–36.0)
MCV: 85.2 fL (ref 78.0–100.0)
MONOS PCT: 10 %
Monocytes Absolute: 0.7 10*3/uL (ref 0.1–1.0)
NEUTROS ABS: 3.3 10*3/uL (ref 1.7–7.7)
Neutrophils Relative %: 47 %
Platelets: 212 10*3/uL (ref 150–400)
RBC: 3.45 MIL/uL — ABNORMAL LOW (ref 3.87–5.11)
RDW: 24 % — ABNORMAL HIGH (ref 11.5–15.5)
WBC: 7 10*3/uL (ref 4.0–10.5)

## 2016-12-20 LAB — URINALYSIS, ROUTINE W REFLEX MICROSCOPIC
Bilirubin Urine: NEGATIVE
GLUCOSE, UA: NEGATIVE mg/dL
Hgb urine dipstick: NEGATIVE
KETONES UR: NEGATIVE mg/dL
NITRITE: NEGATIVE
PH: 6 (ref 5.0–8.0)
Protein, ur: 30 mg/dL — AB
Specific Gravity, Urine: 1.015 (ref 1.005–1.030)

## 2016-12-20 LAB — I-STAT CG4 LACTIC ACID, ED
LACTIC ACID, VENOUS: 0.54 mmol/L (ref 0.5–1.9)
Lactic Acid, Venous: 1.1 mmol/L (ref 0.5–1.9)

## 2016-12-20 LAB — MAGNESIUM: Magnesium: 1.6 mg/dL — ABNORMAL LOW (ref 1.7–2.4)

## 2016-12-20 MED ORDER — FERROUS SULFATE 325 (65 FE) MG PO TABS
325.0000 mg | ORAL_TABLET | Freq: Every day | ORAL | Status: DC
  Administered 2016-12-21 – 2016-12-22 (×2): 325 mg via ORAL
  Filled 2016-12-20 (×2): qty 1

## 2016-12-20 MED ORDER — DEXTROSE 5 % IV SOLN
2.0000 g | INTRAVENOUS | Status: DC
  Administered 2016-12-21 – 2016-12-22 (×2): 2 g via INTRAVENOUS
  Filled 2016-12-20 (×2): qty 2

## 2016-12-20 MED ORDER — POLYETHYLENE GLYCOL 3350 17 G PO PACK
17.0000 g | PACK | Freq: Every day | ORAL | Status: DC
  Administered 2016-12-21: 17 g via ORAL
  Filled 2016-12-20 (×2): qty 1

## 2016-12-20 MED ORDER — PRO-STAT SUGAR FREE PO LIQD
30.0000 mL | Freq: Two times a day (BID) | ORAL | Status: DC
  Administered 2016-12-21 – 2016-12-22 (×2): 30 mL via ORAL
  Filled 2016-12-20 (×4): qty 30

## 2016-12-20 MED ORDER — ONDANSETRON HCL 4 MG/2ML IJ SOLN: 4.0000 mg | Freq: Four times a day (QID) | INTRAMUSCULAR | Status: DC | PRN

## 2016-12-20 MED ORDER — CEFTRIAXONE IV (FOR PTA / DISCHARGE USE ONLY): 2.0000 g | INTRAVENOUS | Status: DC

## 2016-12-20 MED ORDER — METOPROLOL TARTRATE 25 MG PO TABS
12.5000 mg | ORAL_TABLET | Freq: Two times a day (BID) | ORAL | Status: DC
  Administered 2016-12-21 – 2016-12-22 (×4): 12.5 mg via ORAL
  Filled 2016-12-20 (×4): qty 1

## 2016-12-20 MED ORDER — METRONIDAZOLE 500 MG PO TABS
500.0000 mg | ORAL_TABLET | Freq: Three times a day (TID) | ORAL | Status: DC
  Administered 2016-12-21 – 2016-12-22 (×4): 500 mg via ORAL
  Filled 2016-12-20 (×4): qty 1

## 2016-12-20 MED ORDER — HEPARIN SODIUM (PORCINE) 5000 UNIT/ML IJ SOLN
5000.0000 [IU] | Freq: Three times a day (TID) | INTRAMUSCULAR | Status: DC
  Administered 2016-12-21 – 2016-12-22 (×5): 5000 [IU] via SUBCUTANEOUS
  Filled 2016-12-20 (×6): qty 1

## 2016-12-20 MED ORDER — CEFEPIME IV (FOR PTA / DISCHARGE USE ONLY): 2.0000 g | Freq: Two times a day (BID) | INTRAVENOUS | Status: DC

## 2016-12-20 MED ORDER — ACETAMINOPHEN 650 MG RE SUPP: 650.0000 mg | Freq: Four times a day (QID) | RECTAL | Status: DC | PRN

## 2016-12-20 MED ORDER — OXYCODONE-ACETAMINOPHEN 5-325 MG PO TABS: 1.0000 | ORAL_TABLET | ORAL | Status: DC | PRN

## 2016-12-20 MED ORDER — RIVASTIGMINE 13.3 MG/24HR TD PT24
13.3000 mg | MEDICATED_PATCH | Freq: Every day | TRANSDERMAL | Status: DC
  Administered 2016-12-21 – 2016-12-22 (×2): 13.3 mg via TRANSDERMAL
  Filled 2016-12-20 (×2): qty 1

## 2016-12-20 MED ORDER — VITAMIN D 1000 UNITS PO TABS
1000.0000 [IU] | ORAL_TABLET | Freq: Every day | ORAL | Status: DC
  Administered 2016-12-21 – 2016-12-22 (×2): 1000 [IU] via ORAL
  Filled 2016-12-20 (×3): qty 1

## 2016-12-20 MED ORDER — CLOPIDOGREL BISULFATE 75 MG PO TABS
75.0000 mg | ORAL_TABLET | Freq: Every day | ORAL | Status: DC
  Administered 2016-12-21 – 2016-12-22 (×2): 75 mg via ORAL
  Filled 2016-12-20 (×2): qty 1

## 2016-12-20 MED ORDER — ASPIRIN EC 81 MG PO TBEC
81.0000 mg | DELAYED_RELEASE_TABLET | Freq: Every day | ORAL | Status: DC
  Administered 2016-12-21 – 2016-12-22 (×2): 81 mg via ORAL
  Filled 2016-12-20 (×3): qty 1

## 2016-12-20 MED ORDER — ADULT MULTIVITAMIN W/MINERALS CH
1.0000 | ORAL_TABLET | Freq: Every day | ORAL | Status: DC
  Administered 2016-12-21 – 2016-12-22 (×2): 1 via ORAL
  Filled 2016-12-20 (×2): qty 1

## 2016-12-20 MED ORDER — ACETAMINOPHEN 325 MG PO TABS: 650.0000 mg | ORAL_TABLET | Freq: Four times a day (QID) | ORAL | Status: DC | PRN

## 2016-12-20 MED ORDER — SODIUM CHLORIDE 0.9 % IV BOLUS (SEPSIS)
500.0000 mL | Freq: Once | INTRAVENOUS | Status: AC
  Administered 2016-12-21: 500 mL via INTRAVENOUS

## 2016-12-20 MED ORDER — MAGNESIUM OXIDE 400 (241.3 MG) MG PO TABS
800.0000 mg | ORAL_TABLET | Freq: Once | ORAL | Status: AC
  Administered 2016-12-21: 800 mg via ORAL
  Filled 2016-12-20: qty 2

## 2016-12-20 MED ORDER — POTASSIUM CHLORIDE 2 MEQ/ML IV SOLN
INTRAVENOUS | Status: DC
  Administered 2016-12-21: 04:00:00 via INTRAVENOUS
  Filled 2016-12-20 (×2): qty 1000

## 2016-12-20 MED ORDER — ATORVASTATIN CALCIUM 10 MG PO TABS: 10.0000 mg | ORAL_TABLET | Freq: Every day | ORAL | Status: DC

## 2016-12-20 MED ORDER — MAGNESIUM SULFATE 2 GM/50ML IV SOLN
2.0000 g | Freq: Once | INTRAVENOUS | Status: AC
  Administered 2016-12-21: 2 g via INTRAVENOUS
  Filled 2016-12-20: qty 50

## 2016-12-20 MED ORDER — POTASSIUM CHLORIDE CRYS ER 20 MEQ PO TBCR
20.0000 meq | EXTENDED_RELEASE_TABLET | Freq: Once | ORAL | Status: AC
  Administered 2016-12-21: 20 meq via ORAL
  Filled 2016-12-20: qty 1

## 2016-12-20 MED ORDER — ONDANSETRON HCL 4 MG PO TABS: 4.0000 mg | ORAL_TABLET | Freq: Four times a day (QID) | ORAL | Status: DC | PRN

## 2016-12-20 NOTE — ED Notes (Signed)
Per Dr. Corlis LeakMacKuen, RN can pull off of PICC line for second set of cultures.

## 2016-12-20 NOTE — ED Notes (Signed)
Attempted to draw blood but was unsuccessful.

## 2016-12-20 NOTE — ED Notes (Signed)
Patient placed on a purewick catheter. Will obtain urine specimen as soon as patient urinates.

## 2016-12-20 NOTE — ED Provider Notes (Signed)
[facility] DEPT Provider Note   CSN: [id number] Arrival date & time: 12/20/16  1453     History   Chief Complaint Chief Complaint  Patient presents with  . hyponatremia  . hypokalemia    HPI Sylvia Medina is a 81 y.o. female.  The history is provided by the patient and medical records. No language interpreter was used.  Illness  This is a recurrent problem. The current episode started 12 to 24 hours ago. The problem occurs constantly. The problem has not changed since onset.Pertinent negatives include no chest pain, no abdominal pain, no headaches and no shortness of breath. Nothing aggravates the symptoms. Nothing relieves the symptoms. She has tried nothing for the symptoms. The treatment provided no relief.  Leg Pain   This is a recurrent problem. The problem occurs constantly. The problem has been gradually worsening. The pain is present in the left lower leg, left ankle, left foot and right ankle. The quality of the pain is described as aching. The pain is severe. She has tried nothing for the symptoms.    Past Medical History:  Diagnosis Date  . Anemia 07/04/2012  . Cardiac arrhythmia 07/04/2012  . CKD (chronic kidney disease) stage 4, GFR 15-29 ml/min (HCC)   . CVA (cerebral vascular accident) ([place]) 07/04/2012  . Dementia 07/04/2012  . Diabetes mellitus type 2, diet-controlled ([place]) 07/04/2012  . Hyperthyroidism   . Unspecified constipation 07/04/2012  . Unspecified vitamin D deficiency 07/04/2012    Patient Active Problem List   Diagnosis Date Noted  . Goals of care, counseling/discussion   . Palliative care encounter   . Osteomyelitis ([place]) 11/17/2016  . Lower extremity ulceration ([place]) 11/17/2016  . Hyperthyroidism 11/13/2016  . Atherosclerosis of native arteries of extremities with gangrene, left leg ([place]) 10/27/2016  . Atherosclerosis of artery of extremity with ulceration ([place]) 10/27/2016  . CKD (chronic kidney disease) stage 4,  GFR 15-29 ml/min (HCC) 06/15/2016  . AKI (acute kidney injury) ([place]) 06/06/2016  . Acute lower UTI 06/06/2016  . Acute cystitis with hematuria   . Hypernatremia 08/31/2015  . Chronic constipation 07/27/2015  . Protein-calorie malnutrition ([place]) 07/27/2015  . Alzheimer's disease 09/18/2014  . Hyperlipidemia 09/18/2014  . Anemia of chronic disease 09/18/2014  . Essential hypertension, benign 10/07/2013  . Thyroiditis, subacute 04/14/2013  . Dementia without behavioral disturbance 02/10/2013  . Vitamin D deficiency 07/04/2012  . Depression 07/04/2012  . Diabetes mellitus type 2, diet-controlled ([place]) 07/04/2012  . CVA (cerebral vascular accident) ([place]) 07/04/2012  . Cardiac arrhythmia 07/04/2012    Past Surgical History:  Procedure Laterality Date  . IR FLUORO GUIDE CV LINE RIGHT  12/02/2016  . IR US GUIDE VASC ACCESS RIGHT  12/02/2016    OB History    No data available       Home Medications    Prior to Admission medications   Medication Sig Start Date End Date Taking? Authorizing Provider  Amino Acids-Protein Hydrolys (FEEDING SUPPLEMENT, PRO-STAT SUGAR FREE 64,) LIQD Take 30 mLs by mouth 2 (two) times daily as needed.     [provider]  aspirin EC 81 MG tablet Take 1 tablet (81 mg total) by mouth daily. 10/28/16   Rhyne, Hulen Shouts, PA-C  atorvastatin (LIPITOR) 10 MG tablet Take 1 tablet (10 mg total) by mouth daily. 10/28/16   Rhyne, Hulen Shouts, PA-C  cefTRIAXone (ROCEPHIN) IVPB Inject 2 g into the vein daily. Indication:  Wound infection Last Day of Therapy:  12/29/16 Labs - Once  weekly:  CBC/D and BMP, Labs - Every other week:  ESR and CRP 12/05/16 12/29/16  Patrecia Pour, Christean Grief, MD  Cholecalciferol 1000 units tablet Take 1 tablet (1,000 Units total) by mouth daily. 08/31/15   Ngetich, Dinah C, NP  clopidogrel (PLAVIX) 75 MG tablet Take 1 tablet (75 mg total) by mouth daily with breakfast. 10/29/16   Rhyne, Hulen Shouts, PA-C  collagenase (SANTYL) ointment Apply 1  application topically daily.    [provider]  ferrous sulfate 325 (65 FE) MG tablet Take 325 mg by mouth daily with breakfast.     [provider]  methimazole (TAPAZOLE) 10 MG tablet Take 10 mg by mouth daily.    [provider]  metoprolol tartrate (LOPRESSOR) 25 MG tablet Take 0.5 tablets (12.5 mg total) by mouth 2 (two) times daily. 12/05/16   Doreatha Lew, MD  metroNIDAZOLE (FLAGYL) 500 MG tablet Take 1 tablet (500 mg total) by mouth 3 (three) times daily. 12/05/16 12/29/16  Doreatha Lew, MD  Multiple Vitamin (MULTIVITAMIN) tablet Take 1 tablet by mouth daily.    [provider]  oxyCODONE-acetaminophen (ROXICET) 5-325 MG tablet Take 1 tablet by mouth every 4 (four) hours as needed for severe pain (Prior to dressing changes 30 minutes before). 12/05/16   Doreatha Lew, MD  polyethylene glycol powder [facility]) powder Take 17 g by mouth daily. Mix in 4-8 ounces of Orange Juice and take by mouth daily for constipation. 07/04/12   Judeth Cornfield, NP  Rivastigmine (EXELON) 13.3 MG/24HR PT24 Place 1 patch (13.3 mg total) onto the skin daily. 07/04/12   Judeth Cornfield, NP    Family History Family History  Problem Relation Age of Onset  . Thyroid disease Neg Hx     Social History Social History   Tobacco Use  . Smoking status: Former Smoker    Packs/day: 0.50    Years: 40.00    Pack years: 20.00  . Smokeless tobacco: Never Used  Substance Use Topics  . Alcohol use: No  . Drug use: No     Allergies   Patient has no known allergies.   Review of Systems Review of Systems  Constitutional: Negative for appetite change, chills, diaphoresis, fatigue and fever.  HENT: Negative for congestion.   Respiratory: Negative for cough, chest tightness, shortness of breath and wheezing.   Cardiovascular: Negative for chest pain and palpitations.  Gastrointestinal: Negative for abdominal pain, diarrhea, nausea and vomiting.    Genitourinary: Negative for dysuria and flank pain.  Musculoskeletal: Negative for back pain, neck pain and neck stiffness.  Skin: Positive for rash and wound.  Neurological: Negative for light-headedness and headaches.  Psychiatric/Behavioral: Negative for agitation and confusion.  All other systems reviewed and are negative.    Physical Exam Updated Vital Signs BP (!) 145/61 (BP Location: Left Arm)   Pulse 85   Temp (!) 97.4 F (36.3 C) (Oral)   Resp 16   SpO2 100%   Physical Exam  Constitutional: She appears well-developed and well-nourished. No distress.  HENT:  Head: Normocephalic.  Mouth/Throat: Oropharynx is clear and moist. No oropharyngeal exudate.  Eyes: EOM are normal. Pupils are equal, round, and reactive to light. Left conjunctiva has a hemorrhage.    Medial left eye subconjunctival hemorrhage.  Neck: Normal range of motion.  Cardiovascular: Normal rate and intact distal pulses.  No murmur heard. Pulmonary/Chest: Effort normal. No respiratory distress. She has no wheezes. She exhibits no tenderness.  Abdominal: Soft. There is no tenderness.  Musculoskeletal: She exhibits edema (edema in legs) and tenderness.       Left ankle: Tenderness.       Right foot: There is tenderness.       Left foot: There is tenderness.       Feet:  Patient has bilateral dopplerable DP pulses.  PT pulses difficult to find due to body habitus.  Neurological: She is alert. No sensory deficit. She exhibits normal muscle tone.  Skin: Capillary refill takes less than 2 seconds. Rash noted. She is not diaphoretic. There is erythema.  Nursing note and vitals reviewed.    ED Treatments / Results  Labs (all labs ordered are listed, but only abnormal results are displayed) Labs Reviewed  URINALYSIS, ROUTINE W REFLEX MICROSCOPIC - Abnormal; Notable for the following components:      Result Value   Protein, ur 30 (*)    Leukocytes, UA TRACE (*)    Bacteria, UA RARE (*)    Squamous  Epithelial / LPF 0-5 (*)    All other components within normal limits  CBC WITH DIFFERENTIAL/PLATELET - Abnormal; Notable for the following components:   RBC 3.45 (*)    Hemoglobin 8.9 (*)    HCT 29.4 (*)    MCH 25.8 (*)    RDW 24.0 (*)    All other components within normal limits  COMPREHENSIVE METABOLIC PANEL - Abnormal; Notable for the following components:   Sodium 154 (*)    Potassium 3.3 (*)    Chloride 124 (*)    CO2 21 (*)    BUN 31 (*)    Creatinine, Ser 1.94 (*)    Calcium 8.6 (*)    Total Protein 5.9 (*)    Albumin 2.0 (*)    Alkaline Phosphatase 325 (*)    Total Bilirubin 0.2 (*)    GFR calc non Af Amer 23 (*)    GFR calc Af Amer 27 (*)    All other components within normal limits  MAGNESIUM - Abnormal; Notable for the following components:   Magnesium 1.6 (*)    All other components within normal limits  URINE CULTURE  CULTURE, BLOOD (ROUTINE X 2)  CULTURE, BLOOD (ROUTINE X 2)  CBC WITH DIFFERENTIAL/PLATELET  PROTIME-INR  BASIC METABOLIC PANEL  MAGNESIUM  I-STAT CG4 LACTIC ACID, ED  I-STAT CG4 LACTIC ACID, ED    EKG  EKG Interpretation None       Radiology No results found.  Procedures Procedures (including critical care time)  Medications Ordered in ED Medications  sodium chloride 0.9 % bolus 500 mL (not administered)  sodium chloride 0.45 % 1,000 mL with potassium chloride 40 mEq infusion (not administered)  magnesium sulfate IVPB 2 g 50 mL (not administered)  potassium chloride SA (K-DUR,KLOR-CON) CR tablet 20 mEq (not administered)  feeding supplement (PRO-STAT SUGAR FREE 64) liquid 30 mL (not administered)  aspirin EC tablet 81 mg (not administered)  atorvastatin (LIPITOR) tablet 10 mg (not administered)  Cholecalciferol 1,000 Units (not administered)  clopidogrel (PLAVIX) tablet 75 mg (not administered)  ferrous sulfate tablet 325 mg (not administered)  metoprolol tartrate (LOPRESSOR) tablet 12.5 mg (not administered)  metroNIDAZOLE  (FLAGYL) tablet 500 mg (not administered)  multivitamin tablet 1 tablet (not administered)  oxyCODONE-acetaminophen (PERCOCET/ROXICET) 5-325 MG per tablet 1 tablet (not administered)  polyethylene glycol powder (GLYCOLAX/MIRALAX) container 17 g (not administered)  rivastigmine (EXELON) 13.3 MG/24HR 13.3 mg (not administered)  heparin injection 5,000 Units (not administered)  acetaminophen (TYLENOL) tablet 650 mg (not administered)  Or  acetaminophen (TYLENOL) suppository 650 mg (not administered)  ondansetron (ZOFRAN) tablet 4 mg (not administered)    Or  ondansetron (ZOFRAN) injection 4 mg (not administered)  magnesium oxide (MAG-OX) tablet 800 mg (not administered)  cefTRIAXone (ROCEPHIN) 2 g in dextrose 5 % 50 mL IVPB (not administered)     Initial Impression / Assessment and Plan / ED Course  I have reviewed the triage vital signs and the nursing notes.  Pertinent labs & imaging results that were available during my care of the patient were reviewed by me and considered in my medical decision making (see chart for details).     Sylvia Medina is a 81 y.o. female with a past medical history significant for dementia, diabetes, chronic kidney disease, stroke, and bilateral leg infections currently with a central line receiving Rocephin and Flagyl as an outpatient who presents from the nursing facility for electrolyte abnormalities and worsening leg pain.  Patient says that her bilateral legs been hurting worse over the last few days.  She is unsure why she is at the emergency department.  She is disoriented to time.  Patient denies fevers, chills, chest pain, shortness of breath, cough, nausea, vomiting, constipation, diarrhea, dysuria.  She denies significant back pain.  She reports her pain is severe when her legs are manipulated.  On exam, lungs were clear.  Abdomen was nontender.  Patient has very large body habitus diffusely.  Patient's legs were examined and patient has a large  wound on the left lateral and posterior leg.  There are areas of black necrotic appearing tissue.  There is a foul smell.  Mild purulence present.  Patient has some surrounding erythema present.  Patient's right leg had a small 3 cm heel wound that was also tender to palpation.  Patient had dopplerable dorsalis pedis pulses bilaterally.  Patient was able to wiggle her toes and feel sensation in the toes.  According to nursing dog mentation, patient was sent for acute kidney injury, low potassium, and elevated sodium.  Patient will have laboratory testing collected as well as blood cultures.  Given the worsening symptoms of leg pain, cannot rule out worsening leg infection.  Anticipate reassessment after lab testing.  10:56 PM Diagnostic testing results are seen above.  Patient CBC shows no leukocytosis and improved anemia to prior.  Metabolic panel does however show hypernatremia of 154 which is trending upward from a normal value 2 weeks ago.  Potassium also slightly decreased to 3.3.  Chloride remains elevated at 124.  Suspect patient is dehydrated.  Patient's kidney function is elevated but is slightly improved from prior.  Magnesium is slightly decreased.  Lactic acid negative x2.  Based on the patient's stable lactic acid and no leukocytosis, I suspect her leg infections are being managed by the antibiotics although there did appear to be some necrotic appearing tissue on the left lateral leg.  Due to the patient's electrolyte abnormalities with the worsening hypernatremia, hospitalist team will be called for admission.    Final Clinical Impressions(s) / ED Diagnoses   Final diagnoses:  Hypernatremia    Clinical Impression: 1. Hypernatremia     Disposition: Admit to hospitalist service    Tegeler, Gwenyth Allegra, MD 12/21/16 0127

## 2016-12-20 NOTE — ED Notes (Signed)
Patient still has not given urine specimen.

## 2016-12-20 NOTE — ED Notes (Signed)
Checked to see if patient had given urine specimen, but none at this time.

## 2016-12-20 NOTE — ED Triage Notes (Signed)
Per EMS-here for abnormal labs-low sodium and potassium-patient asymptomatic-history of dementia-at baseline mentally-no report was given to EMS when [facility]

## 2016-12-20 NOTE — ED Notes (Signed)
Patient has not urinated at this time but is still connected to the purewick catheter. Will re-check.

## 2016-12-20 NOTE — H&P (Signed)
History and Physical    GLENDALE WHERRY XIP:[id number] DOB: 08-Nov-1935 DOA: 12/20/2016  PCP: Gerlene Fee, NP   Patient coming from: SNF  Chief Complaint: Lab abnormalities   HPI: Sylvia Medina is a 81 y.o. female with medical history significant for dementia, history of CVA, chronic kidney disease stage IV, chronic normocytic anemia, osteomyelitis of the lower extremities on IV antibiotics at her SNF, and hypertension, now presenting from her nursing facility for evaluation of lab abnormalities.  Patient is unable to contribute to the history secondary to her dementia.  Patient had been hospitalized 1 month ago with sepsis secondary to lower extremity wounds, orthopedic surgery recommended amputation, but the patient's family declined.  She was evaluated by infectious disease consultant during the recent admission and switched to SNF for a long course of IV antibiotics.  Patient has not been expressing any complaints, but blood work revealed hyponatremia and hypokalemia, and she was transported to the ED for evaluation of this.  ED Course: Upon arrival to the ED, patient is found to be afebrile, saturating well on room air, and stable.  Chemistry panel reveals a serum sodium of 154 and potassium 3.3.  Magnesium level is 1.6 and serum creatinine is 1.9 similar to her priors.  CBC is notable for hemoglobin of 8.9, higher than recent priors.  Lactic acid is reassuringly normal and urinalysis is unremarkable.  Patient will be observed on the medical-surgical unit for ongoing evaluation and management of hypernatremia with clinical dehydration.  Review of Systems:  Unable to complete ROS secondary to patient's clinical condition with dementia.  Past Medical History:  Diagnosis Date  . Anemia 07/04/2012  . Cardiac arrhythmia 07/04/2012  . CKD (chronic kidney disease) stage 4, GFR 15-29 ml/min (HCC)   . CVA (cerebral vascular accident) ([place]) 07/04/2012  . Dementia 07/04/2012  . Diabetes  mellitus type 2, diet-controlled ([place]) 07/04/2012  . Hyperthyroidism   . Unspecified constipation 07/04/2012  . Unspecified vitamin D deficiency 07/04/2012    Past Surgical History:  Procedure Laterality Date  . IR FLUORO GUIDE CV LINE RIGHT  12/02/2016  . IR US GUIDE VASC ACCESS RIGHT  12/02/2016     reports that she has quit smoking. She has a 20.00 pack-year smoking history. she has never used smokeless tobacco. She reports that she does not drink alcohol or use drugs.  No Known Allergies  Family History  Problem Relation Age of Onset  . Thyroid disease Neg Hx      Prior to Admission medications   Medication Sig Start Date End Date Taking? Authorizing Provider  Amino Acids-Protein Hydrolys (FEEDING SUPPLEMENT, PRO-STAT SUGAR FREE 64,) LIQD Take 30 mLs by mouth 2 (two) times daily as needed.    Yes [provider]  aspirin EC 81 MG tablet Take 1 tablet (81 mg total) by mouth daily. 10/28/16  Yes Rhyne, Samantha J, PA-C  atorvastatin (LIPITOR) 10 MG tablet Take 1 tablet (10 mg total) by mouth daily. 10/28/16  Yes Rhyne, Samantha J, PA-C  ceFEPime (MAXIPIME) IVPB Inject 2 g every 12 (twelve) hours into the vein.   Yes [provider]  cefTRIAXone (ROCEPHIN) IVPB Inject 2 g into the vein daily. Indication:  Wound infection Last Day of Therapy:  12/29/16 Labs - Once weekly:  CBC/D and BMP, Labs - Every other week:  ESR and CRP 12/05/16 12/29/16 Yes Doreatha Lew, MD  Cholecalciferol 1000 units tablet Take 1 tablet (1,000 Units total) by mouth daily. 08/31/15  Yes Ngetich, Buyer, retail  C, NP  clopidogrel (PLAVIX) 75 MG tablet Take 1 tablet (75 mg total) by mouth daily with breakfast. 10/29/16  Yes Rhyne, Samantha J, PA-C  collagenase (SANTYL) ointment Apply 1 application topically daily.   Yes [provider]  ferrous sulfate 325 (65 FE) MG tablet Take 325 mg by mouth daily with breakfast.    Yes [provider]  metoprolol tartrate (LOPRESSOR) 25 MG  tablet Take 0.5 tablets (12.5 mg total) by mouth 2 (two) times daily. 12/05/16  Yes Patrecia Pour, Christean Grief, MD  metroNIDAZOLE (FLAGYL) 500 MG tablet Take 1 tablet (500 mg total) by mouth 3 (three) times daily. 12/05/16 12/29/16 Yes Doreatha Lew, MD  Multiple Vitamin (MULTIVITAMIN) tablet Take 1 tablet by mouth daily.   Yes [provider]  oxyCODONE-acetaminophen (ROXICET) 5-325 MG tablet Take 1 tablet by mouth every 4 (four) hours as needed for severe pain (Prior to dressing changes 30 minutes before). 12/05/16  Yes Patrecia Pour, Christean Grief, MD  polyethylene glycol powder [facility]) powder Take 17 g by mouth daily. Mix in 4-8 ounces of Orange Juice and take by mouth daily for constipation. 07/04/12  Yes Judeth Cornfield, NP  Rivastigmine (EXELON) 13.3 MG/24HR PT24 Place 1 patch (13.3 mg total) onto the skin daily. 07/04/12  Yes Judeth Cornfield, NP    Physical Exam: Vitals:   12/20/16 1513 12/20/16 1753 12/20/16 2011 12/20/16 2217  BP: (!) 145/61 (!) 145/50 140/65 112/73  Pulse: 85 79 73 67  Resp: _0 Temp: (!) 97.4 F (36.3 C)     TempSrc: Oral     SpO2: 100% 100% 100% 100%      Constitutional: NAD, calm, comfortable Eyes: PERTLA, lids and conjunctivae normal ENMT: Mucous membranes are moist. Posterior pharynx clear of any exudate or lesions.   Neck: normal, supple, no masses, no thyromegaly Respiratory: clear to auscultation bilaterally, no wheezing, no crackles. Normal respiratory effort.   Cardiovascular: S1 & S2 heard, regular rate and rhythm. No significant JVD. Abdomen: No distension, no tenderness, no masses palpated. Bowel sounds normal.  Musculoskeletal: no clubbing / cyanosis. Marked edema and ulcerations to lower legs.  Skin: Lower leg wounds bilaterally with surrounding edema and serosanguinous drainage. Otherwise, warm, dry, well-perfused. Neurologic: No gross facial asymmetry. Sensation intact.   Psychiatric: Alert and oriented to person and place  only. Calm, cooperative.     Labs on Admission: I have personally reviewed following labs and imaging studies  CBC: Recent Labs  Lab 12/20/16 2045  WBC 7.0  NEUTROABS 3.3  HGB 8.9*  HCT 29.4*  MCV 85.2  PLT 967   Basic Metabolic Panel: Recent Labs  Lab 12/20/16 2045  NA 154*  K 3.3*  CL 124*  CO2 21*  GLUCOSE 85  BUN 31*  CREATININE 1.94*  CALCIUM 8.6*  MG 1.6*   GFR: CrCl cannot be calculated (Unknown ideal weight.). Liver Function Tests: Recent Labs  Lab 12/20/16 2045  AST 40  ALT 17  ALKPHOS 325*  BILITOT 0.2*  PROT 5.9*  ALBUMIN 2.0*   No results for input(s): LIPASE, AMYLASE in the last 168 hours. No results for input(s): AMMONIA in the last 168 hours. Coagulation Profile: No results for input(s): INR, PROTIME in the last 168 hours. Cardiac Enzymes: No results for input(s): CKTOTAL, CKMB, CKMBINDEX, TROPONINI in the last 168 hours. BNP (last 3 results) No results for input(s): PROBNP in the last 8760 hours. HbA1C: No results for input(s): HGBA1C in the last 72 hours. CBG: No results for  input(s): GLUCAP in the last 168 hours. Lipid Profile: No results for input(s): CHOL, HDL, LDLCALC, TRIG, CHOLHDL, LDLDIRECT in the last 72 hours. Thyroid Function Tests: No results for input(s): TSH, T4TOTAL, FREET4, T3FREE, THYROIDAB in the last 72 hours. Anemia Panel: No results for input(s): VITAMINB12, FOLATE, FERRITIN, TIBC, IRON, RETICCTPCT in the last 72 hours. Urine analysis:    Component Value Date/Time   COLORURINE YELLOW 12/20/2016 2128   APPEARANCEUR CLEAR 12/20/2016 2128   LABSPEC 1.015 12/20/2016 2128   PHURINE 6.0 12/20/2016 2128   GLUCOSEU NEGATIVE 12/20/2016 2128   HGBUR NEGATIVE 12/20/2016 2128   [place] NEGATIVE 12/20/2016 2128   [place] NEGATIVE 12/20/2016 2128   PROTEINUR 30 (A) 12/20/2016 2128   NITRITE NEGATIVE 12/20/2016 2128   LEUKOCYTESUR TRACE (A) 12/20/2016 2128   Sepsis  Labs: _0 (procalcitonin:4,lacticidven:4) )No results found for this or any previous visit (from the past 240 hour(s)).   Radiological Exams on Admission: No results found.  EKG: Not performed.   Assessment/Plan  1. Hypernatremia  - Pt with chronic hypernatremia, significantly worsened and likely secondary to dehydration  - Treat with 500 cc NS bolus followed by 0.45% NS infusion - Follow daily wts and I/O's, repeat chem panel in am    2. Hypokalemia, hypomagnesemia  - Serum potassium is 3.3 on admission and magnesium level 1.6  - Treat with  20 mEq oral potassium, KCl in IVF, 800 mg magnesium oxide, and 2 g IV magnesium   - Repeat chemistries in am   3. Chronic osteomyelitis  - Pt was admitted last month with lower extremity wounds, became septic, and orthopedic surgery recommended amputation, but family declined   - ID consulted on the case last month and patient has been receiving IV abx via PICC at her SNF   - No evidence for acute infection; blood cultures collected in ED  - Continue Rocephin and Flagyl  - Wound care consultation requested    4. Hx of CVA  - No new deficit elicited  - Continue ASA, Plavix, Lipitor   5. CKD stage IV  - SCr is 1.94 on admission, consistent with her apparent baseline  - Renally-dose medications, avoid nephrotoxins where possible, repeat chem panel in am    6. Anemia  - Hgb is 8.9 on admission, higher than priors and likely reflecting dehydration  - No bleeding evident   7. Dementia  - Stable  - Continue Exelon     DVT prophylaxis: sq heparin  Code Status: Full  Family Communication: Discussed with patient Disposition Plan: Observe on med-surg Consults called: None Admission status: Observation    Vianne Bulls, MD [facility] Pager [phone]  If 7PM-7AM, please contact night-coverage [email or web address] Password [facility]  12/20/2016, 11:34 PM

## 2016-12-20 NOTE — ED Notes (Signed)
ED Provider at bedside. 

## 2016-12-21 ENCOUNTER — Other Ambulatory Visit: Payer: Self-pay

## 2016-12-21 DIAGNOSIS — E870 Hyperosmolality and hypernatremia: Secondary | ICD-10-CM | POA: Diagnosis not present

## 2016-12-21 DIAGNOSIS — M8639 Chronic multifocal osteomyelitis, multiple sites: Secondary | ICD-10-CM | POA: Diagnosis not present

## 2016-12-21 DIAGNOSIS — N184 Chronic kidney disease, stage 4 (severe): Secondary | ICD-10-CM

## 2016-12-21 DIAGNOSIS — E876 Hypokalemia: Secondary | ICD-10-CM | POA: Diagnosis not present

## 2016-12-21 LAB — BASIC METABOLIC PANEL
ANION GAP: 8 (ref 5–15)
ANION GAP: 8 (ref 5–15)
BUN: 28 mg/dL — AB (ref 6–20)
BUN: 27 mg/dL — ABNORMAL HIGH (ref 6–20)
CALCIUM: 8.3 mg/dL — AB (ref 8.9–10.3)
CHLORIDE: 122 mmol/L — AB (ref 101–111)
CO2: 23 mmol/L (ref 22–32)
CO2: 18 mmol/L — ABNORMAL LOW (ref 22–32)
Calcium: 8.4 mg/dL — ABNORMAL LOW (ref 8.9–10.3)
Chloride: 124 mmol/L — ABNORMAL HIGH (ref 101–111)
Creatinine, Ser: 1.71 mg/dL — ABNORMAL HIGH (ref 0.44–1.00)
Creatinine, Ser: 1.68 mg/dL — ABNORMAL HIGH (ref 0.44–1.00)
GFR calc Af Amer: 32 mL/min — ABNORMAL LOW (ref >60)
GFR calc non Af Amer: 27 mL/min — ABNORMAL LOW (ref >60)
GFR, EST AFRICAN AMERICAN: 32 mL/min — AB (ref >60)
GFR, EST NON AFRICAN AMERICAN: 28 mL/min — AB (ref >60)
GLUCOSE: 89 mg/dL (ref 65–99)
Glucose, Bld: 72 mg/dL (ref 65–99)
POTASSIUM: 4.1 mmol/L (ref 3.5–5.1)
POTASSIUM: 4.8 mmol/L (ref 3.5–5.1)
SODIUM: 153 mmol/L — AB (ref 135–145)
Sodium: 150 mmol/L — ABNORMAL HIGH (ref 135–145)

## 2016-12-21 LAB — MAGNESIUM: MAGNESIUM: 2 mg/dL (ref 1.7–2.4)

## 2016-12-21 MED ORDER — SODIUM CHLORIDE 0.45 % IV SOLN: INTRAVENOUS | Status: DC

## 2016-12-21 MED ORDER — SODIUM CHLORIDE 0.45 % IV SOLN
INTRAVENOUS | Status: DC
  Administered 2016-12-21 – 2016-12-22 (×2): via INTRAVENOUS
  Filled 2016-12-21 (×5): qty 1000

## 2016-12-21 NOTE — Clinical Social Work Note (Signed)
Clinical Social Work Assessment  Patient Details  Name: Sylvia Medina MRN: [id number] Date of Birth: 1935-05-12  Date of referral:  12/21/16               Reason for consult:  (admitted from facility)                Permission sought to share information with:  Family Supports, Magazine features editoracility Contact Representative Permission granted to share information::  Yes, Verbal Permission Granted  Name::     daughter Hillary BowLeona  Agency::  [facility]  Relationship::     Contact Information:     Housing/Transportation Living arrangements for the past 2 months:  Skilled Building surveyorursing Facility Source of Information:  Adult Children, Medical Team Patient Interpreter Needed:  None Criminal Activity/Legal Involvement Pertinent to Current Situation/Hospitalization:  No - Comment as needed Significant Relationships:  Adult Children Lives with:  Facility Resident Do you feel safe going back to the place where you live?  Yes Need for family participation in patient care:  Yes (Comment)(daughter involved)  Care giving concerns:  Pt [facility]. No caregiving concerns reported.   Social Worker assessment / plan:  CSW consulted as pt admitted from facility- [facility] where she resides with long term care.  Spoke with pt's daughter who confirms they are pleased with [facility] and plan for her to return at time of DC. Spoke with facility- updated them pt admitted under observation status. Advises check with facility at time of DC for needs as she may not need FL2 due to observations status only.  Plan: return to [facility] at DC- will follow and assist.   Employment status:  Retired Database administratornsurance information:  Managed Medicare PT Recommendations:  Not assessed at this time Information / Referral to community resources:     Patient/Family's Response to care:  Family appreciative of care  Patient/Family's Understanding of and Emotional Response to Diagnosis, Current Treatment, and  Prognosis:  Daughter demonstrates adequate understanding of treatment at this time. Emotionally appropriate to situation and demeanor pleasant  Emotional Assessment Appearance:  Appears older than stated age Attitude/Demeanor/Rapport:  (appropriate) Affect (typically observed):  Calm Orientation:  (UTA- sleeping) Alcohol / Substance use:  Not Applicable Psych involvement (Current and /or in the community):  No (Comment)  Discharge Needs  Concerns to be addressed:  Care Coordination(return to SNF at DC) Readmission within the last 30 days:  Yes Current discharge risk:  None Barriers to Discharge:  Continued Medical Work up   Terex CorporationMeghan R Orrie Schubert, LCSW 12/21/2016, 3:22 PM  [phone]

## 2016-12-21 NOTE — Progress Notes (Signed)
PROGRESS NOTE [facility]   Sylvia Murdochvelyn B Hattabaugh   EAV:[id number] DOB: 1935-06-10  DOA: 12/20/2016 PCP: No primary care provider on file.   Brief Narrative:  Sylvia Medina is a  81 year old female with medical history significant for dementia, chronic kidney disease stage IV, osteomyelitis of lower extremity on IV antibiotic skilled nursing facility and hypertension.  Patient presented to the emergency department was found to have.  Patient has not expressed any complaints, although she was admitted due to hyponatremia and hypokalemia.  Blood workup revealed sodium of 154, potassium of 3.3, magnesium of 1.6, serum creatinine of 1.9 up from priors and hemoglobin of 8.9 higher from recent.  Subjective: Patient seen and examined, she has no complaints.  Only pain when touched her legs.  No other concerns.   Assessment & Plan: Hypernatremia Likely secondary to dehydration Treating with IV fluids, check BMP in the morning if stable can return to nursing home.  Hypokalemia/hypomagnesemia Repleting Check BMP in the morning  Chronic osteomyelitis Patient on IV antibiotic via PICC line at her SNF.  She is on Rocephin and Flagyl. Patient was admitted a month ago where she became septic and orthopedic surgery recommended amputation but family declined.  No evidence of acute infection at this point.  Blood cultures collected in ED Wound care consultation appreciated.  Continue daily dressing per wound care recommendations.  Chronic kidney disease stage IV Serum creatinine on admission 1.94 Current creatinine 1.62 improved with IV fluid Check BMP in the morning  Anemia Hemoglobin is 8.9 on admission this is higher from last admission Not overt bleeding Check CBC in the morning  Dementia Stable, mental status at baseline  DVT prophylaxis: Subq heparin Code Status: Full code Family Communication: None at bedside Disposition Plan: Back to SNF in the next 24 hours  Consultants:    None  Procedures:   None  Antimicrobials:  None   Objective: Vitals:   12/21/16 0800 12/21/16 0836 12/21/16 0900 12/21/16 1129  BP: (!) 115/54 (!) 109/56 122/68 136/64  Pulse: 64 69 70 66  Resp:   17 20  Temp:      TempSrc:      SpO2: 100% 100% 100% 100%  Weight:    124 kg (273 lb 5.9 oz)    Intake/Output Summary (Last 24 hours) at 12/21/2016 1737 Last data filed at 12/21/2016 0119 Gross per 24 hour  Intake 550 ml  Output -  Net 550 ml   Filed Weights   12/21/16 1129  Weight: 124 kg (273 lb 5.9 oz)    Examination:  General exam: Appears calm and comfortable  HEENT: AC/AT, PERRLA, OP moist and clear Respiratory system: CTA, no wheezes,crackle or rhonchi, PICC line in place right upper chest Cardiovascular system: S1 & S2 heard, RRR. No JVD, murmurs, rubs or gallops Gastrointestinal system: Obese abdomen is nondistended, soft and nontender.Normal bowel sounds heard. Central nervous system: Oriented to person and place only, alert, nonfocal. Extremities: Marked bilateral edema with ulceration in the lower extremities -chronic Skin: No where the wound bilaterally with surrounding edema and serosanguineous drainage Psychiatry: Mood & affect appropriate.    Data Reviewed: I have personally reviewed following labs and imaging studies  CBC: Recent Labs  Lab 12/20/16 2045  WBC 7.0  NEUTROABS 3.3  HGB 8.9*  HCT 29.4*  MCV 85.2  PLT 212   Basic Metabolic Panel: Recent Labs  Lab 12/20/16 2045 12/21/16 0835 12/21/16 1455  NA 154* 153* 150*  K 3.3* 4.1 4.8  CL 124*  122* 124*  CO2 21* 23 18*  GLUCOSE 85 72 89  BUN 31* 28* 27*  CREATININE 1.94* 1.71* 1.68*  CALCIUM 8.6* 8.4* 8.3*  MG 1.6* 2.0  --    GFR: Estimated Creatinine Clearance: 36.5 mL/min (A) (by C-G formula based on SCr of 1.68 mg/dL (H)). Liver Function Tests: Recent Labs  Lab 12/20/16 2045  AST 40  ALT 17  ALKPHOS 325*  BILITOT 0.2*  PROT 5.9*  ALBUMIN 2.0*   No results for  input(s): LIPASE, AMYLASE in the last 168 hours. No results for input(s): AMMONIA in the last 168 hours. Coagulation Profile: No results for input(s): INR, PROTIME in the last 168 hours. Cardiac Enzymes: No results for input(s): CKTOTAL, CKMB, CKMBINDEX, TROPONINI in the last 168 hours. BNP (last 3 results) No results for input(s): PROBNP in the last 8760 hours. HbA1C: No results for input(s): HGBA1C in the last 72 hours. CBG: No results for input(s): GLUCAP in the last 168 hours. Lipid Profile: No results for input(s): CHOL, HDL, LDLCALC, TRIG, CHOLHDL, LDLDIRECT in the last 72 hours. Thyroid Function Tests: No results for input(s): TSH, T4TOTAL, FREET4, T3FREE, THYROIDAB in the last 72 hours. Anemia Panel: No results for input(s): VITAMINB12, FOLATE, FERRITIN, TIBC, IRON, RETICCTPCT in the last 72 hours. Sepsis Labs: Recent Labs  Lab 12/20/16 1627 12/20/16 1953  LATICACIDVEN 1.10 0.54    Recent Results (from the past 240 hour(s))  Blood culture (routine x 2)     Status: None (Preliminary result)   Collection Time: 12/20/16  4:15 PM  Result Value Ref Range Status   Specimen Description BLOOD RIGHT HAND  Final   Special Requests   Final    BOTTLES DRAWN AEROBIC AND ANAEROBIC Blood Culture results may not be optimal due to an inadequate volume of blood received in culture bottles   Culture   Final    NO GROWTH < 24 HOURS Performed at [facility], [address], [place], [place] [address]    Report Status PENDING  Incomplete  Blood culture (routine x 2)     Status: None (Preliminary result)   Collection Time: 12/20/16  7:45 PM  Result Value Ref Range Status   Specimen Description BLOOD LEFT HAND  Final   Special Requests   Final    BOTTLES DRAWN AEROBIC AND ANAEROBIC Blood Culture adequate volume   Culture   Final    NO GROWTH < 24 HOURS Performed at [facility], [address], [place], [place] [address]    Report Status PENDING  Incomplete       Radiology Studies: No results found.    Scheduled Meds: . aspirin EC  81 mg Oral Daily  . atorvastatin  10 mg Oral q1800  . cholecalciferol  1,000 Units Oral Daily  . clopidogrel  75 mg Oral Q breakfast  . feeding supplement (PRO-STAT SUGAR FREE 64)  30 mL Oral BID BM  . ferrous sulfate  325 mg Oral Q breakfast  . heparin  5,000 Units Subcutaneous Q8H  . metoprolol tartrate  12.5 mg Oral BID  . metroNIDAZOLE  500 mg Oral TID  . multivitamin with minerals  1 tablet Oral Daily  . polyethylene glycol  17 g Oral Daily  . rivastigmine  13.3 mg Transdermal Daily   Continuous Infusions: . cefTRIAXone (ROCEPHIN)  IV Stopped (12/21/16 1507)  . sodium chloride 0.45 % with kcl 100 mL/hr at 12/21/16 1344     LOS: 0 days    Time  spent: Total of 25 minutes spent with pt, greater than 50% of which was spent in discussion of  treatment, counseling and coordination of care    Latrelle DodrillEdwin Silva, MD Pager: Text Page via [email or web address]   If 7PM-7AM, please contact night-coverage [email or web address] 12/21/2016, 5:37 PM

## 2016-12-21 NOTE — ED Notes (Signed)
Report given to ClearbrookFred, CaliforniaRN. [phone].

## 2016-12-21 NOTE — ED Notes (Signed)
Phlebotomy called, coming to draw labs on pt.

## 2016-12-21 NOTE — ED Notes (Signed)
I attempted to collect labs and was unsuccessful. 

## 2016-12-21 NOTE — Consult Note (Signed)
WOC Nurse wound consult note Reason for Consult: Left lateral leg wound, left medial heel, both unstageable, and right lateral posterior heel healing stage III Wound type: pressure and venous insufficiency Pressure Injury POA: Yes Measurement: right lateral and posterior heel  3cm x 2cm 0.2cm 75% pink 25% yellow small serous drainage with evidence of prior wound healing in the immediate peri wound area (scar).  Left medial 1cm x 2cm unstageable with black eschar, intact peri wound Left lateral leg 30cm x 8cm largely necrotic area with moderate amount tan exudate on old dressing, extremely painful, noted area previous consults by Orthopedics (11/18/16, Dr. Lajoyce Cornersuda) recommending amputation. and Vascular Surgery, Dr Imogene Burnhen (11/17/2016) who had nothing to add to the POC. Wound bed:as described above Drainage (amount, consistency, odor) as above Periwound:as above Dressing procedure/placement/frequency: I have provided nurses with orders for Goals of care are to minimize odor, absorb drainage, and reduce infection - not wound healing. To left lateral leg, cleanse with NS, pat dry, apply Silver Hydrofiber, cover with ABD, secure with kerlix, change daily and prn drainage strike through. Left medial heel, place foam and place in [employer]Prevalon Boot. To right lateral heel wound, pt is already wearing a Prevalon boot to this extremity, cleanse wound with NS, pat dry, apply NS moistened gauze, cover with dry gauze, wrap with kerlix, adhere kerlix to kerlix, no tape on skin. Nutritional consult or protein supplements could be considered, please order if you agree. A mattress with low air loss feature has been ordered to be delivered prior to pt arriving on floor.  We will not follow, but will remain available to this patient, to nursing, and the medical and/or surgical teams.  Please re-consult if we need to assist further.   Barnett HatterMelinda Hilda Rynders, RN-C, WTA-C Wound Treatment Associate

## 2016-12-21 NOTE — Progress Notes (Signed)
CSW following as pt admitted from facility- observation status admission 12/20/16.  Review of chart indicates pt long term care resident [facility]. Left voicemail for facility and for daughter.  Will follow and assist with transition back to SNF at DC.   Ilean SkillMeghan Zyrus Hetland, MSW, LCSW Clinical Social Work 12/21/2016 Coverage for [phone]

## 2016-12-22 DIAGNOSIS — E870 Hyperosmolality and hypernatremia: Secondary | ICD-10-CM | POA: Diagnosis not present

## 2016-12-22 LAB — BASIC METABOLIC PANEL
Anion gap: 5 (ref 5–15)
BUN: 27 mg/dL — AB (ref 6–20)
CHLORIDE: 126 mmol/L — AB (ref 101–111)
CO2: 19 mmol/L — AB (ref 22–32)
CREATININE: 1.6 mg/dL — AB (ref 0.44–1.00)
Calcium: 8.3 mg/dL — ABNORMAL LOW (ref 8.9–10.3)
GFR calc Af Amer: 34 mL/min — ABNORMAL LOW (ref >60)
GFR calc non Af Amer: 30 mL/min — ABNORMAL LOW (ref >60)
GLUCOSE: 97 mg/dL (ref 65–99)
Potassium: 4.6 mmol/L (ref 3.5–5.1)
Sodium: 150 mmol/L — ABNORMAL HIGH (ref 135–145)

## 2016-12-22 LAB — MAGNESIUM: Magnesium: 1.8 mg/dL (ref 1.7–2.4)

## 2016-12-22 LAB — URINE CULTURE

## 2016-12-22 LAB — CBC
HCT: 29 % — ABNORMAL LOW (ref 36.0–46.0)
Hemoglobin: 8.9 g/dL — ABNORMAL LOW (ref 12.0–15.0)
MCH: 26.3 pg (ref 26.0–34.0)
MCHC: 30.7 g/dL (ref 30.0–36.0)
MCV: 85.5 fL (ref 78.0–100.0)
PLATELETS: 214 10*3/uL (ref 150–400)
RBC: 3.39 MIL/uL — ABNORMAL LOW (ref 3.87–5.11)
RDW: 24.1 % — AB (ref 11.5–15.5)
WBC: 6 10*3/uL (ref 4.0–10.5)

## 2016-12-22 LAB — MRSA PCR SCREENING: MRSA by PCR: NEGATIVE

## 2016-12-22 MED ORDER — HEPARIN SOD (PORK) LOCK FLUSH 100 UNIT/ML IV SOLN
500.0000 [IU] | Freq: Once | INTRAVENOUS | Status: AC
  Administered 2016-12-22: 250 [IU] via INTRAVENOUS
  Filled 2016-12-22: qty 5

## 2016-12-22 NOTE — NC FL2 (Signed)
[place] MEDICAID FL2 LEVEL OF CARE SCREENING TOOL     IDENTIFICATION  Patient Name: Sylvia Medina Birthdate: 1935-02-24 Sex: female Admission Date (Current Location): 12/20/2016  [facility] [place]   Facility and Address:  [facility],  [address], [place] [id number]      Provider Number: [id number]  Attending Physician Name and Address:  Randel PiggSilva Zapata, Dorma RussellEdwin, MD  Relative Name and Phone Number:  Adron BeneLeona Teachey, [phone]    Current Level of Care: Hospital Recommended Level of Care: Skilled Nursing Facility Prior Approval Number:    Date Approved/Denied:   PASRR Number:    Discharge Plan: SNF    Current Diagnoses: Patient Active Problem List   Diagnosis Date Noted  . Hypokalemia 12/20/2016  . Goals of care, counseling/discussion   . Palliative care encounter   . Osteomyelitis (HCC) 11/17/2016  . Lower extremity ulceration (HCC) 11/17/2016  . Hyperthyroidism 11/13/2016  . Atherosclerosis of native arteries of extremities with gangrene, left leg (HCC) 10/27/2016  . Atherosclerosis of artery of extremity with ulceration (HCC) 10/27/2016  . CKD (chronic kidney disease) stage 4, GFR 15-29 ml/min (HCC) 06/15/2016  . AKI (acute kidney injury) (HCC) 06/06/2016  . Acute lower UTI 06/06/2016  . Acute cystitis with hematuria   . Hypernatremia 08/31/2015  . Chronic constipation 07/27/2015  . Protein-calorie malnutrition (HCC) 07/27/2015  . Alzheimer's disease 09/18/2014  . Hyperlipidemia 09/18/2014  . Anemia of chronic disease 09/18/2014  . Essential hypertension, benign 10/07/2013  . Thyroiditis, subacute 04/14/2013  . Dementia without behavioral disturbance 02/10/2013  . Vitamin D deficiency 07/04/2012  . Depression 07/04/2012  . Diabetes mellitus type 2, diet-controlled (HCC) 07/04/2012  . CVA (cerebral vascular accident) (HCC) 07/04/2012  . Cardiac arrhythmia 07/04/2012    Orientation RESPIRATION BLADDER Height & Weight      Self, Situation, Place  Normal Incontinent Weight: 273 lb (123.8 kg) Height:  5' 6.14" (168 cm)  BEHAVIORAL SYMPTOMS/MOOD NEUROLOGICAL BOWEL NUTRITION STATUS      Incontinent Diet(Heart)  AMBULATORY STATUS COMMUNICATION OF NEEDS Skin   Extensive Assist Verbally PU Stage and Appropriate Care, Other (Comment)(Pressure Injury Unstageable - Full thickness tissue loss in which the base of the ulcer is covered by slough (yellow, tan, gray, green or brown) and/or eschar (tan, brown or black) in the wound bed.  Location: Right Heel) PU Stage 1 Dressing: ( PressureInjury09/15/18StageI-Intactskinwithnon-blanchablerednessofalocalizedareausuallyoverabonyprominence.kneefoldsopenarea    Location: Knee Location Orientation: Anterior   Foam Dressing) PU Stage 2 Dressing: ( PressureInjury09/15/18StageII-Partialthicknesslossofdermispresentingasashallowopenulcerwithared,pinkwoundbedwithoutslough.openareanearLhipandLabdominalarea     Location: Abdomen Location Orientation: Left;Lateral )     Pressure Injury Unstageable - Full thickness tissue loss in which the base of the ulcer is covered by slough (yellow, tan, gray, green or brown) and/or eschar (tan, brown or black) in the wound bed. Location: Right Heel Foam; Gauze: Changed twice a day Pressure Injury Location Left; Lateral Ankle Foam Dressing changed daily Pressure Injury Location: Left, Lower Left Dressing Type: Alginate Changed Daily Pressure Injury Location: Right Toe Dressing Type: Foam changed PRN               Personal Care Assistance Level of Assistance  Bathing, Dressing Bathing Assistance: Maximum assistance   Dressing Assistance: Maximum assistance     Functional Limitations Info             SPECIAL CARE FACTORS FREQUENCY                       Contractures  Additional Factors Info  Code Status, Allergies Code Status Info: Full Code Allergies Info:  NKA           Current Medications (12/22/2016):  This is the current hospital active medication list Current Facility-Administered Medications  Medication Dose Route Frequency Provider Last Rate Last Dose  . acetaminophen (TYLENOL) tablet 650 mg  650 mg Oral Q6H PRN Opyd, Lavone Neriimothy S, MD       Or  . acetaminophen (TYLENOL) suppository 650 mg  650 mg Rectal Q6H PRN Opyd, Lavone Neriimothy S, MD      . aspirin EC tablet 81 mg  81 mg Oral Daily Opyd, Lavone Neriimothy S, MD   81 mg at 12/22/16 1000  . atorvastatin (LIPITOR) tablet 10 mg  10 mg Oral q1800 Opyd, Lavone Neriimothy S, MD      . cefTRIAXone (ROCEPHIN) 2 g in dextrose 5 % 50 mL IVPB  2 g Intravenous Q24H Opyd, Lavone Neriimothy S, MD 100 mL/hr at 12/22/16 0957 2 g at 12/22/16 0957  . cholecalciferol (VITAMIN D) tablet 1,000 Units  1,000 Units Oral Daily Opyd, Lavone Neriimothy S, MD   1,000 Units at 12/22/16 1000  . clopidogrel (PLAVIX) tablet 75 mg  75 mg Oral Q breakfast Opyd, Lavone Neriimothy S, MD   75 mg at 12/22/16 1000  . feeding supplement (PRO-STAT SUGAR FREE 64) liquid 30 mL  30 mL Oral BID BM Opyd, Lavone Neriimothy S, MD   30 mL at 12/22/16 0959  . ferrous sulfate tablet 325 mg  325 mg Oral Q breakfast Opyd, Lavone Neriimothy S, MD   325 mg at 12/22/16 1001  . heparin injection 5,000 Units  5,000 Units Subcutaneous Q8H Opyd, Lavone Neriimothy S, MD   5,000 Units at 12/22/16 0515  . metoprolol tartrate (LOPRESSOR) tablet 12.5 mg  12.5 mg Oral BID Opyd, Lavone Neriimothy S, MD   12.5 mg at 12/22/16 1000  . metroNIDAZOLE (FLAGYL) tablet 500 mg  500 mg Oral TID Briscoe Deutscherpyd, Timothy S, MD   500 mg at 12/22/16 0959  . multivitamin with minerals tablet 1 tablet  1 tablet Oral Daily Opyd, Lavone Neriimothy S, MD   1 tablet at 12/22/16 0959  . ondansetron (ZOFRAN) tablet 4 mg  4 mg Oral Q6H PRN Opyd, Lavone Neriimothy S, MD       Or  . ondansetron (ZOFRAN) injection 4 mg  4 mg Intravenous Q6H PRN Opyd, Lavone Neriimothy S, MD      . oxyCODONE-acetaminophen (PERCOCET/ROXICET) 5-325 MG per tablet 1 tablet  1 tablet Oral Q4H PRN Opyd, Lavone Neriimothy S, MD      . polyethylene  glycol (MIRALAX / GLYCOLAX) packet 17 g  17 g Oral Daily Opyd, Lavone Neriimothy S, MD   17 g at 12/21/16 1312  . rivastigmine (EXELON) 13.3 MG/24HR 13.3 mg  13.3 mg Transdermal Daily Opyd, Lavone Neriimothy S, MD   13.3 mg at 12/22/16 1001     Discharge Medications: Please see discharge summary for a list of discharge medications.  Relevant Imaging Results:  Relevant Lab Results:   Additional Information SSN: [id number]  Antionette PolesKimberly L Manvir Thorson, LCSW

## 2016-12-22 NOTE — Progress Notes (Signed)
Patient returning to [facility]. Facility aware of patient's discharge and patient's ability to return. PTAR contacted, patient's family notified. Patient's RN provided with number to call report, packet complete. CSW signing off, no other needs identified at this time.  Celso SickleKimberly Ziaire Bieser, [place]CSWA Clinical Social Worker [facility] Cell#: [phone]

## 2016-12-22 NOTE — Care Management Obs Status (Signed)
MEDICARE OBSERVATION STATUS NOTIFICATION   Patient Details  Name: Sylvia Medina MRN: [id number] Date of Birth: 06-11-1935   Medicare Observation Status Notification Given:  Yes    Bartholome BillCLEMENTS, Lanina Larranaga H, RN 12/22/2016, 11:49 AM

## 2016-12-22 NOTE — Discharge Summary (Signed)
Physician Discharge Summary  Sylvia Medina  ZOX:[id number]  DOB: [birth year]  DOA: 12/20/2016 PCP: No primary care provider on file.  Admit date: 12/20/2016 Discharge date: 12/22/2016  Admitted From: SNF  Disposition:  SNF   Recommendations for Outpatient Follow-up:  1. Follow up with PCP in 1-2 weeks 2. Please obtain BMP/CBC in one week to monitor renal function and Hgb  3. Please follow up on the following pending results: Blood cultures thus far negative and urine cultures 4. Encourage Free water intake    Discharge Condition: Stable  CODE STATUS: Full code  Diet recommendation: Heart Healthy  Brief/Interim Summary: Sylvia Medina is a  81 year old female with medical history significant for dementia, chronic kidney disease stage IV, osteomyelitis of lower extremity on IV antibiotic skilled nursing facility and hypertension.  Patient presented to the emergency department was found to have.  Patient has not expressed any complaints, although she was admitted due to hyponatremia and hypokalemia.  Blood workup revealed sodium of 154, potassium of 3.3, magnesium of 1.6, serum creatinine of 1.9 up from priors and hemoglobin of 8.9 higher from recent. Patient electrolytes were repleted and she was given IVF. Renal function improved, Na decreased to 150 and other electrolytes normalized. Patient remains asymptomatic. Wound nurse was consulted for leg lesions with recommendation as stated below. Patient remains afebrile and tolerating diet well will be discharge to SNF.   Subjective: Patient seen and examined, having breakfast. Has no complaints. Continue with pain if touch her legs. No acute events overnight   Discharge Diagnoses/Hospital Course:  Hypernatremia Due to dehydration Treated with IVF Na down to 150 and patient asymptomatic, this can be monitored as outpatient.  Encourage water intake  Check BMP in 1 week   Hypokalemia/hypomagnesemia - resolved  Check BMP in 1 week    Chronic osteomyelitis Patient on IV antibiotic via PICC line at her SNF.  She is on Rocephin and Flagyl. Patient was admitted a month ago where she became septic and orthopedic surgery recommended amputation but family declined.  No evidence of acute infection at this point.  Blood cultures collected in ED show no growth so far. Wound care consultation appreciated.  per RN note - Goals of care are to minimize odor, absorb drainage, and reduce infection - not wound healing. To left lateral leg, cleanse with NS, pat dry, apply Silver Hydrofiber, cover with ABD, secure with kerlix, change daily and prn drainage strike through. Left medial heel, place foam and place in [employer]. To right lateral heel wound, pt is already wearing a Prevalon boot to this extremity, cleanse wound with NS, pat dry, apply NS moistened gauze, cover with dry gauze, wrap with kerlix, adhere kerlix to kerlix, no tape on skin.   Acute renal failure on Chronic kidney disease stage IV Due to dehydration - Serum creatinine on admission 1.94 Cr upon discharge 1.60, good improvement with hydration over 24 hrs  Check Cr in week, encourage oral hydration   Anemia Hemoglobin is 8.9 on admission this is higher from last admission - remains stable  Not overt bleeding Check CBC in 1 week   Dementia Stable, mental status at baseline  All other chronic medical condition were stable during the hospitalization.  On the day of the discharge the patient's vitals were stable, and no other acute medical condition were reported by patient. the patient was felt safe to be discharge to SNF   Discharge Instructions  You were cared for by a hospitalist during your hospital  stay. If you have any questions about your discharge medications or the care you received while you were in the hospital after you are discharged, you can call the unit and asked to speak with the hospitalist on call if the hospitalist that took care of you is not  available. Once you are discharged, your primary care physician will handle any further medical issues. Please note that NO REFILLS for any discharge medications will be authorized once you are discharged, as it is imperative that you return to your primary care physician (or establish a relationship with a primary care physician if you do not have one) for your aftercare needs so that they can reassess your need for medications and monitor your lab values.  Discharge Instructions    Call MD for:  difficulty breathing, headache or visual disturbances   Complete by:  As directed    Call MD for:  extreme fatigue   Complete by:  As directed    Call MD for:  hives   Complete by:  As directed    Call MD for:  persistant dizziness or light-headedness   Complete by:  As directed    Call MD for:  persistant nausea and vomiting   Complete by:  As directed    Call MD for:  redness, tenderness, or signs of infection (pain, swelling, redness, odor or green/yellow discharge around incision site)   Complete by:  As directed    Call MD for:  severe uncontrolled pain   Complete by:  As directed    Call MD for:  temperature >100.4   Complete by:  As directed    Diet - low sodium heart healthy   Complete by:  As directed    Discharge instructions   Complete by:  As directed    Keep patient well hydrated, encourage free water intake   Increase activity slowly   Complete by:  As directed      Allergies as of 12/22/2016   No Known Allergies     Medication List    STOP taking these medications   ceFEPime IVPB Commonly known as:  MAXIPIME     TAKE these medications   aspirin EC 81 MG tablet Take 1 tablet (81 mg total) by mouth daily.   atorvastatin 10 MG tablet Commonly known as:  LIPITOR Take 1 tablet (10 mg total) by mouth daily.   cefTRIAXone IVPB Commonly known as:  ROCEPHIN Inject 2 g into the vein daily. Indication:  Wound infection Last Day of Therapy:  12/29/16 Labs - Once weekly:   CBC/D and BMP, Labs - Every other week:  ESR and CRP   Cholecalciferol 1000 units tablet Take 1 tablet (1,000 Units total) by mouth daily.   clopidogrel 75 MG tablet Commonly known as:  PLAVIX Take 1 tablet (75 mg total) by mouth daily with breakfast.   collagenase ointment Commonly known as:  SANTYL Apply 1 application topically daily.   feeding supplement (PRO-STAT SUGAR FREE 64) Liqd Take 30 mLs by mouth 2 (two) times daily as needed.   ferrous sulfate 325 (65 FE) MG tablet Take 325 mg by mouth daily with breakfast.   metoprolol tartrate 25 MG tablet Commonly known as:  LOPRESSOR Take 0.5 tablets (12.5 mg total) by mouth 2 (two) times daily.   metroNIDAZOLE 500 MG tablet Commonly known as:  FLAGYL Take 1 tablet (500 mg total) by mouth 3 (three) times daily.   multivitamin tablet Take 1 tablet by mouth daily.  oxyCODONE-acetaminophen 5-325 MG tablet Commonly known as:  ROXICET Take 1 tablet by mouth every 4 (four) hours as needed for severe pain (Prior to dressing changes 30 minutes before).   polyethylene glycol powder powder Commonly known as:  GLYCOLAX/MIRALAX Take 17 g by mouth daily. Mix in 4-8 ounces of Orange Juice and take by mouth daily for constipation.   rivastigmine 13.3 MG/24HR Commonly known as:  EXELON Place 1 patch (13.3 mg total) onto the skin daily.      Contact information for after-discharge care    Destination    [facility] .   Service:  Skilled Nursing Contact information: [address] [place] [phone]             No Known Allergies  Consultations:  None    Procedures/Studies: Ir Fluoro Guide Cv Line Right  Result Date: 12/02/2016 INDICATION: Sacral decubitus ulcer with infection, access for antibiotics EXAM: ULTRASOUND AND FLUOROSCOPIC RIGHT IJ DOUBLE-LUMEN TUNNELED PICC LINE INSERTION MEDICATIONS: 1% LIDOCAINE LOCALLY CONTRAST:  None FLUOROSCOPY TIME:  Six seconds (1 mGy)  COMPLICATIONS: None immediate. TECHNIQUE: The procedure, risks, benefits, and alternatives were explained to the patient and informed written consent was obtained. A timeout was performed prior to the initiation of the procedure. The right neck and chest were prepped with chlorhexidine in a sterile fashion, and a sterile drape was applied covering the operative field. Maximum barrier sterile technique with sterile gowns and gloves were used for the procedure. A timeout was performed prior to the initiation of the procedure. Local anesthesia was provided with 1% lidocaine. Under direct ultrasound guidance, the right internal jugular vein was accessed with a micropuncture kit after the overlying soft tissues were anesthetized with 1% lidocaine. An ultrasound image was saved for documentation purposes. A guidewire was advanced to the level of the superior caval-atrial junction for measurement purposes and the PICC line was cut to length. In the right chest, a subcutaneous tunnel was created under sterile conditions and local anesthesia. The 6 French double-lumen cuffed PICC line was tunneled subcutaneously to the venotomy site. A peel-away sheath was placed and a 21 cm, 6 [place], dual lumen was inserted to level of the superior caval-atrial junction. A post procedure spot fluoroscopic was obtained. The catheter easily aspirated and flushed and was sutured in place. Venotomy site closed with derma bond. A dressing was placed. The patient tolerated the procedure well without immediate post procedural complication. FINDINGS: After catheter placement, the tip lies within the superior cavoatrial junction. The catheter aspirates and flushes normally and is ready for immediate use. IMPRESSION: Successful ultrasound and fluoroscopic guided placement of a right internal jugular vein approach, 21 cm, 6 French, double-lumen tunneled power PICC with tip at the superior caval-atrial junction. The PICC line is ready for immediate  use. Electronically Signed   By: Jerilynn Mages.  Shick M.D.   On: 12/02/2016 15:25   Ir US Guide Vasc Access Right  Result Date: 12/02/2016 INDICATION: Sacral decubitus ulcer with infection, access for antibiotics EXAM: ULTRASOUND AND FLUOROSCOPIC RIGHT IJ DOUBLE-LUMEN TUNNELED PICC LINE INSERTION MEDICATIONS: 1% LIDOCAINE LOCALLY CONTRAST:  None FLUOROSCOPY TIME:  Six seconds (1 mGy) COMPLICATIONS: None immediate. TECHNIQUE: The procedure, risks, benefits, and alternatives were explained to the patient and informed written consent was obtained. A timeout was performed prior to the initiation of the procedure. The right neck and chest were prepped with chlorhexidine in a sterile fashion, and a sterile drape was applied covering the operative field. Maximum barrier sterile  technique with sterile gowns and gloves were used for the procedure. A timeout was performed prior to the initiation of the procedure. Local anesthesia was provided with 1% lidocaine. Under direct ultrasound guidance, the right internal jugular vein was accessed with a micropuncture kit after the overlying soft tissues were anesthetized with 1% lidocaine. An ultrasound image was saved for documentation purposes. A guidewire was advanced to the level of the superior caval-atrial junction for measurement purposes and the PICC line was cut to length. In the right chest, a subcutaneous tunnel was created under sterile conditions and local anesthesia. The 6 French double-lumen cuffed PICC line was tunneled subcutaneously to the venotomy site. A peel-away sheath was placed and a 21 cm, 6 [place], dual lumen was inserted to level of the superior caval-atrial junction. A post procedure spot fluoroscopic was obtained. The catheter easily aspirated and flushed and was sutured in place. Venotomy site closed with derma bond. A dressing was placed. The patient tolerated the procedure well without immediate post procedural complication. FINDINGS: After catheter  placement, the tip lies within the superior cavoatrial junction. The catheter aspirates and flushes normally and is ready for immediate use. IMPRESSION: Successful ultrasound and fluoroscopic guided placement of a right internal jugular vein approach, 21 cm, 6 French, double-lumen tunneled power PICC with tip at the superior caval-atrial junction. The PICC line is ready for immediate use. Electronically Signed   By: Jerilynn Mages.  Shick M.D.   On: 12/02/2016 15:25    Discharge Exam: Vitals:   12/22/16 0419 12/22/16 0500  BP: 127/66   Pulse: 64   Resp: 18   Temp: (!) 93 F (33.9 C) (!) 94.7 F (34.8 C)  SpO2: 99%    Vitals:   12/21/16 2112 12/22/16 0117 12/22/16 0419 12/22/16 0500  BP: 118/71  127/66   Pulse: 76  64   Resp: 18  18   Temp: (!) 92.8 F (33.8 C)  (!) 93 F (33.9 C) (!) 94.7 F (34.8 C)  TempSrc: Axillary  Axillary Axillary  SpO2: 98%  99%   Weight: 124 kg (273 lb 5.9 oz) 123.8 kg (273 lb)    Height: 5' 6.14" (1.68 m)       General: Pt is alert, awake, not in acute distress Cardiovascular: RRR, S1/S2  Respiratory: CTA Abdominal: Obese, soft NTNT  CNS: Oriented to person and place only, no focal  Extremities: Bilateral edema with ulceration in lower extremities - chronic    The results of significant diagnostics from this hospitalization (including imaging, microbiology, ancillary and laboratory) are listed below for reference.     Microbiology: Recent Results (from the past 240 hour(s))  Blood culture (routine x 2)     Status: None (Preliminary result)   Collection Time: 12/20/16  4:15 PM  Result Value Ref Range Status   Specimen Description BLOOD RIGHT HAND  Final   Special Requests   Final    BOTTLES DRAWN AEROBIC AND ANAEROBIC Blood Culture results may not be optimal due to an inadequate volume of blood received in culture bottles   Culture   Final    NO GROWTH 2 DAYS Performed at [facility], [place] [address], [place],  [address]    Report Status  PENDING  Incomplete  Blood culture (routine x 2)     Status: None (Preliminary result)   Collection Time: 12/20/16  7:45 PM  Result Value Ref Range Status   Specimen Description BLOOD LEFT HAND  Final   Special Requests  Final    BOTTLES DRAWN AEROBIC AND ANAEROBIC Blood Culture adequate volume   Culture   Final    NO GROWTH 2 DAYS Performed at [facility], [place] [address] [place], [place] [address]    Report Status PENDING  Incomplete  Urine culture     Status: None (Preliminary result)   Collection Time: 12/20/16  9:28 PM  Result Value Ref Range Status   Specimen Description URINE, CLEAN CATCH  Final   Special Requests NONE  Final   Culture   Final    CULTURE REINCUBATED FOR BETTER GROWTH Performed at [facility], [place] [address] [place], [place] [address]    Report Status PENDING  Incomplete     Labs: BNP (last 3 results) No results for input(s): BNP in the last 8760 hours. Basic Metabolic Panel: Recent Labs  Lab 12/20/16 2045 12/21/16 0835 12/21/16 1455 12/22/16 0418  NA 154* 153* 150* 150*  K 3.3* 4.1 4.8 4.6  CL 124* 122* 124* 126*  CO2 21* 23 18* 19*  GLUCOSE 85 72 89 97  BUN 31* 28* 27* 27*  CREATININE 1.94* 1.71* 1.68* 1.60*  CALCIUM 8.6* 8.4* 8.3* 8.3*  MG 1.6* 2.0  --  1.8   Liver Function Tests: Recent Labs  Lab 12/20/16 2045  AST 40  ALT 17  ALKPHOS 325*  BILITOT 0.2*  PROT 5.9*  ALBUMIN 2.0*   No results for input(s): LIPASE, AMYLASE in the last 168 hours. No results for input(s): AMMONIA in the last 168 hours. CBC: Recent Labs  Lab 12/20/16 2045 12/22/16 0418  WBC 7.0 6.0  NEUTROABS 3.3  --   HGB 8.9* 8.9*  HCT 29.4* 29.0*  MCV 85.2 85.5  PLT 212 214   Cardiac Enzymes: No results for input(s): CKTOTAL, CKMB, CKMBINDEX, TROPONINI in the last 168 hours. BNP: Invalid input(s): POCBNP CBG: No results for input(s): GLUCAP in the last 168 hours. D-Dimer No results for input(s): DDIMER in the last 72 hours. Hgb  A1c No results for input(s): HGBA1C in the last 72 hours. Lipid Profile No results for input(s): CHOL, HDL, LDLCALC, TRIG, CHOLHDL, LDLDIRECT in the last 72 hours. Thyroid function studies No results for input(s): TSH, T4TOTAL, T3FREE, THYROIDAB in the last 72 hours.  Invalid input(s): FREET3 Anemia work up No results for input(s): VITAMINB12, FOLATE, FERRITIN, TIBC, IRON, RETICCTPCT in the last 72 hours. Urinalysis    Component Value Date/Time   COLORURINE YELLOW 12/20/2016 2128   APPEARANCEUR CLEAR 12/20/2016 2128   LABSPEC 1.015 12/20/2016 2128   PHURINE 6.0 12/20/2016 2128   GLUCOSEU NEGATIVE 12/20/2016 2128   HGBUR NEGATIVE 12/20/2016 2128   [place] NEGATIVE 12/20/2016 2128   [place] NEGATIVE 12/20/2016 2128   PROTEINUR 30 (A) 12/20/2016 2128   NITRITE NEGATIVE 12/20/2016 2128   LEUKOCYTESUR TRACE (A) 12/20/2016 2128   Sepsis Labs Invalid input(s): PROCALCITONIN,  WBC,  LACTICIDVEN Microbiology Recent Results (from the past 240 hour(s))  Blood culture (routine x 2)     Status: None (Preliminary result)   Collection Time: 12/20/16  4:15 PM  Result Value Ref Range Status   Specimen Description BLOOD RIGHT HAND  Final   Special Requests   Final    BOTTLES DRAWN AEROBIC AND ANAEROBIC Blood Culture results may not be optimal due to an inadequate volume of blood received in culture bottles   Culture   Final    NO GROWTH 2 DAYS Performed at [facility], [place] [address] [place], [place]  [address]    Report Status PENDING  Incomplete  Blood culture (routine x 2)     Status: None (Preliminary result)   Collection Time: 12/20/16  7:45 PM  Result Value Ref Range Status   Specimen Description BLOOD LEFT HAND  Final   Special Requests   Final    BOTTLES DRAWN AEROBIC AND ANAEROBIC Blood Culture adequate volume   Culture   Final    NO GROWTH 2 DAYS Performed at [facility], [address] [place], [place] [address]    Report Status PENDING  Incomplete   Urine culture     Status: None (Preliminary result)   Collection Time: 12/20/16  9:28 PM  Result Value Ref Range Status   Specimen Description URINE, CLEAN CATCH  Final   Special Requests NONE  Final   Culture   Final    CULTURE REINCUBATED FOR BETTER GROWTH Performed at [facility], [place] [address], [place],  [address]    Report Status PENDING  Incomplete     Time coordinating discharge: 32 minutes  SIGNED:  Chipper Oman, MD  [facility] 12/22/2016, 11:28 AM  Pager please text page via  [email or web address] Password [email or web address]

## 2016-12-25 LAB — CULTURE, BLOOD (ROUTINE X 2)
Culture: NO GROWTH
Culture: NO GROWTH
Special Requests: ADEQUATE

## 2017-01-03 ENCOUNTER — Emergency Department (HOSPITAL_COMMUNITY): Payer: Medicare Other

## 2017-01-03 ENCOUNTER — Encounter (HOSPITAL_COMMUNITY): Payer: Self-pay | Admitting: Emergency Medicine

## 2017-01-03 ENCOUNTER — Inpatient Hospital Stay (HOSPITAL_COMMUNITY)
Admission: EM | Admit: 2017-01-03 | Discharge: 2017-01-07 | DRG: 871 | Disposition: A | Discharge status: Hospice | Payer: Medicare Other | Attending: Internal Medicine | Admitting: Internal Medicine

## 2017-01-03 DIAGNOSIS — Z7982 Long term (current) use of aspirin: Secondary | ICD-10-CM

## 2017-01-03 DIAGNOSIS — L97909 Non-pressure chronic ulcer of unspecified part of unspecified lower leg with unspecified severity: Secondary | ICD-10-CM | POA: Diagnosis present

## 2017-01-03 DIAGNOSIS — F0390 Unspecified dementia without behavioral disturbance: Secondary | ICD-10-CM | POA: Diagnosis present

## 2017-01-03 DIAGNOSIS — I129 Hypertensive chronic kidney disease with stage 1 through stage 4 chronic kidney disease, or unspecified chronic kidney disease: Secondary | ICD-10-CM | POA: Diagnosis present

## 2017-01-03 DIAGNOSIS — Z515 Encounter for palliative care: Secondary | ICD-10-CM

## 2017-01-03 DIAGNOSIS — G9340 Encephalopathy, unspecified: Secondary | ICD-10-CM | POA: Diagnosis present

## 2017-01-03 DIAGNOSIS — E119 Type 2 diabetes mellitus without complications: Secondary | ICD-10-CM

## 2017-01-03 DIAGNOSIS — E0590 Thyrotoxicosis, unspecified without thyrotoxic crisis or storm: Secondary | ICD-10-CM | POA: Diagnosis present

## 2017-01-03 DIAGNOSIS — Z79899 Other long term (current) drug therapy: Secondary | ICD-10-CM

## 2017-01-03 DIAGNOSIS — A419 Sepsis, unspecified organism: Secondary | ICD-10-CM | POA: Diagnosis not present

## 2017-01-03 DIAGNOSIS — D649 Anemia, unspecified: Secondary | ICD-10-CM

## 2017-01-03 DIAGNOSIS — Z95820 Peripheral vascular angioplasty status with implants and grafts: Secondary | ICD-10-CM

## 2017-01-03 DIAGNOSIS — E872 Acidosis, unspecified: Secondary | ICD-10-CM

## 2017-01-03 DIAGNOSIS — E1122 Type 2 diabetes mellitus with diabetic chronic kidney disease: Secondary | ICD-10-CM | POA: Diagnosis present

## 2017-01-03 DIAGNOSIS — Z6841 Body Mass Index (BMI) 40.0 and over, adult: Secondary | ICD-10-CM

## 2017-01-03 DIAGNOSIS — G9341 Metabolic encephalopathy: Secondary | ICD-10-CM | POA: Diagnosis present

## 2017-01-03 DIAGNOSIS — Z66 Do not resuscitate: Secondary | ICD-10-CM | POA: Diagnosis present

## 2017-01-03 DIAGNOSIS — R4182 Altered mental status, unspecified: Secondary | ICD-10-CM

## 2017-01-03 DIAGNOSIS — E870 Hyperosmolality and hypernatremia: Secondary | ICD-10-CM | POA: Diagnosis present

## 2017-01-03 DIAGNOSIS — D638 Anemia in other chronic diseases classified elsewhere: Secondary | ICD-10-CM | POA: Diagnosis present

## 2017-01-03 DIAGNOSIS — R748 Abnormal levels of other serum enzymes: Secondary | ICD-10-CM

## 2017-01-03 DIAGNOSIS — E1151 Type 2 diabetes mellitus with diabetic peripheral angiopathy without gangrene: Secondary | ICD-10-CM | POA: Diagnosis present

## 2017-01-03 DIAGNOSIS — E11622 Type 2 diabetes mellitus with other skin ulcer: Secondary | ICD-10-CM | POA: Diagnosis present

## 2017-01-03 DIAGNOSIS — E11649 Type 2 diabetes mellitus with hypoglycemia without coma: Secondary | ICD-10-CM | POA: Diagnosis present

## 2017-01-03 DIAGNOSIS — H5500 Unspecified nystagmus: Secondary | ICD-10-CM | POA: Diagnosis present

## 2017-01-03 DIAGNOSIS — N289 Disorder of kidney and ureter, unspecified: Secondary | ICD-10-CM

## 2017-01-03 DIAGNOSIS — Z7401 Bed confinement status: Secondary | ICD-10-CM

## 2017-01-03 DIAGNOSIS — Z8673 Personal history of transient ischemic attack (TIA), and cerebral infarction without residual deficits: Secondary | ICD-10-CM

## 2017-01-03 DIAGNOSIS — D631 Anemia in chronic kidney disease: Secondary | ICD-10-CM | POA: Diagnosis present

## 2017-01-03 DIAGNOSIS — L97928 Non-pressure chronic ulcer of unspecified part of left lower leg with other specified severity: Secondary | ICD-10-CM | POA: Diagnosis present

## 2017-01-03 DIAGNOSIS — N184 Chronic kidney disease, stage 4 (severe): Secondary | ICD-10-CM | POA: Diagnosis present

## 2017-01-03 DIAGNOSIS — E46 Unspecified protein-calorie malnutrition: Secondary | ICD-10-CM | POA: Diagnosis present

## 2017-01-03 DIAGNOSIS — Z87891 Personal history of nicotine dependence: Secondary | ICD-10-CM

## 2017-01-03 DIAGNOSIS — E162 Hypoglycemia, unspecified: Secondary | ICD-10-CM

## 2017-01-03 HISTORY — DX: Encephalopathy, unspecified: G93.40

## 2017-01-03 LAB — CBC WITH DIFFERENTIAL/PLATELET
BASOS PCT: 1 %
Basophils Absolute: 0.1 10*3/uL (ref 0.0–0.1)
EOS PCT: 2 %
Eosinophils Absolute: 0.2 10*3/uL (ref 0.0–0.7)
HEMATOCRIT: 30.5 % — AB (ref 36.0–46.0)
HEMOGLOBIN: 9.5 g/dL — AB (ref 12.0–15.0)
LYMPHS ABS: 2.5 10*3/uL (ref 0.7–4.0)
LYMPHS PCT: 28 %
MCH: 26.3 pg (ref 26.0–34.0)
MCHC: 31.1 g/dL (ref 30.0–36.0)
MCV: 84.5 fL (ref 78.0–100.0)
MONOS PCT: 11 %
Monocytes Absolute: 1 10*3/uL (ref 0.1–1.0)
NEUTROS ABS: 5.1 10*3/uL (ref 1.7–7.7)
Neutrophils Relative %: 58 %
Platelets: 313 10*3/uL (ref 150–400)
RBC: 3.61 MIL/uL — ABNORMAL LOW (ref 3.87–5.11)
RDW: 23.1 % — AB (ref 11.5–15.5)
WBC: 8.9 10*3/uL (ref 4.0–10.5)

## 2017-01-03 LAB — COMPREHENSIVE METABOLIC PANEL
ALBUMIN: 1.7 g/dL — AB (ref 3.5–5.0)
ALT: 15 U/L (ref 14–54)
ANION GAP: 5 (ref 5–15)
AST: 35 U/L (ref 15–41)
Alkaline Phosphatase: 345 U/L — ABNORMAL HIGH (ref 38–126)
BILIRUBIN TOTAL: 0.8 mg/dL (ref 0.3–1.2)
BUN: 21 mg/dL — AB (ref 6–20)
CO2: 17 mmol/L — AB (ref 22–32)
Calcium: 8.1 mg/dL — ABNORMAL LOW (ref 8.9–10.3)
Chloride: 119 mmol/L — ABNORMAL HIGH (ref 101–111)
Creatinine, Ser: 1.81 mg/dL — ABNORMAL HIGH (ref 0.44–1.00)
GFR calc Af Amer: 30 mL/min — ABNORMAL LOW (ref >60)
GFR calc non Af Amer: 25 mL/min — ABNORMAL LOW (ref >60)
GLUCOSE: 62 mg/dL — AB (ref 65–99)
POTASSIUM: 3.8 mmol/L (ref 3.5–5.1)
SODIUM: 141 mmol/L (ref 135–145)
Total Protein: 5.5 g/dL — ABNORMAL LOW (ref 6.5–8.1)

## 2017-01-03 LAB — I-STAT CG4 LACTIC ACID, ED: Lactic Acid, Venous: 1.6 mmol/L (ref 0.5–1.9)

## 2017-01-03 LAB — TROPONIN I: Troponin I: <0.03 ng/mL (ref <0.03)

## 2017-01-03 MED ORDER — DEXTROSE 50 % IV SOLN
50.0000 mL | Freq: Once | INTRAVENOUS | Status: AC
  Administered 2017-01-03: 50 mL via INTRAVENOUS
  Filled 2017-01-03: qty 50

## 2017-01-03 NOTE — ED Triage Notes (Signed)
Per [facility]: Pt to ED from [facility] for AMS. Per EMS, patient's daughter stated that she saw pt yesterday and pt was awake and holding conversation. Night RN reports that patient was presenting like she is now (responds to pain, stares at ceiling, does not follow commands), but nurse thought she was in pain and she gave her crushed oxycodone in water. Airway intact at this time. [facility] (L lateral foot), PICC line removed since then. 20g. L hand. EMS VS: 129/45, HR 66 NSR, RR 14, 100% RA, CBG 84.

## 2017-01-03 NOTE — ED Notes (Signed)
Unable to obtain oral temperature--axillary temp was attempted but unsuccessful as well.

## 2017-01-03 NOTE — ED Provider Notes (Signed)
[facility] EMERGENCY [facility]MENT Provider Note   CSN: [id number] Arrival date & time: 01/03/17  2026     History   Chief Complaint Chief Complaint  Patient presents with  . Altered Mental Status    HPI Sylvia Medina is a 81 y.o. female.  The history is provided by a relative. The history is limited by the condition of the patient (Altered mental status).  Family who is with her states that she was her normal self yesterday.  She was awake and alert and talking.  She was reported to be somewhat agitated this afternoon and was given a dose of oxycodone-acetaminophen at about 5:30 PM.  Since then, she has been completely unresponsive.  There was report from the nursing home that she had episodes of being poorly responsive earlier in the day, but family was not there to observe this.  She had been admitted to the hospital 2 weeks ago with hyponatremia and a urinary tract infection, but her presenting condition was not as bad as today.  Family does state that she has an open wound on her left lower leg.  Past Medical History:  Diagnosis Date  . Anemia 07/04/2012  . Cardiac arrhythmia 07/04/2012  . CKD (chronic kidney disease) stage 4, GFR 15-29 ml/min (HCC)   . CVA (cerebral vascular accident) (HCC) 07/04/2012  . Dementia 07/04/2012  . Diabetes mellitus type 2, diet-controlled (HCC) 07/04/2012  . Encephalopathy 11/17/2016  . Hyperthyroidism   . Unspecified constipation 07/04/2012  . Unspecified vitamin D deficiency 07/04/2012    Patient Active Problem List   Diagnosis Date Noted  . Hypokalemia 12/20/2016  . Goals of care, counseling/discussion   . Palliative care encounter   . Osteomyelitis (HCC) 11/17/2016  . Lower extremity ulceration (HCC) 11/17/2016  . Hyperthyroidism 11/13/2016  . Atherosclerosis of native arteries of extremities with gangrene, left leg (HCC) 10/27/2016  . Atherosclerosis of artery of extremity with ulceration (HCC) 10/27/2016  . CKD  (chronic kidney disease) stage 4, GFR 15-29 ml/min (HCC) 06/15/2016  . AKI (acute kidney injury) (HCC) 06/06/2016  . Acute lower UTI 06/06/2016  . Acute cystitis with hematuria   . Hypernatremia 08/31/2015  . Chronic constipation 07/27/2015  . Protein-calorie malnutrition (HCC) 07/27/2015  . Alzheimer's disease 09/18/2014  . Hyperlipidemia 09/18/2014  . Anemia of chronic disease 09/18/2014  . Essential hypertension, benign 10/07/2013  . Thyroiditis, subacute 04/14/2013  . Dementia without behavioral disturbance 02/10/2013  . Vitamin D deficiency 07/04/2012  . Depression 07/04/2012  . Diabetes mellitus type 2, diet-controlled (HCC) 07/04/2012  . CVA (cerebral vascular accident) (HCC) 07/04/2012  . Cardiac arrhythmia 07/04/2012    Past Surgical History:  Procedure Laterality Date  . ABDOMINAL AORTOGRAM W/LOWER EXTREMITY N/A 10/28/2016   Procedure: ABDOMINAL AORTOGRAM W/LOWER EXTREMITY;  Surgeon: Sherren KernsFields, Charles E, MD;  Location: [facility] INVASIVE CV LAB;  Service: Cardiovascular;  Laterality: N/A;  . IR FLUORO GUIDE CV LINE RIGHT  12/02/2016  . IR US GUIDE VASC ACCESS RIGHT  12/02/2016  . PERIPHERAL VASCULAR INTERVENTION Right 10/28/2016   Procedure: PERIPHERAL VASCULAR INTERVENTION;  Surgeon: Sherren KernsFields, Charles E, MD;  [facility];  Service: Cardiovascular;  Laterality: Right;  Right SFA and Popliteal    OB History    No data available       Home Medications    Prior to Admission medications   Medication Sig Start Date End Date Taking? Authorizing Provider  acetaminophen (TYLENOL) 325 MG tablet Take 650 mg by mouth every 6 (six)  hours as needed.   Yes [provider]  Amino Acids-Protein Hydrolys (FEEDING SUPPLEMENT, PRO-STAT SUGAR FREE 64,) LIQD Take 30 mLs by mouth 2 (two) times daily as needed.    Yes [provider]  aspirin EC 81 MG tablet Take 1 tablet (81 mg total) by mouth daily. 10/28/16  Yes Rhyne, Samantha J, PA-C  atorvastatin (LIPITOR) 10  MG tablet Take 1 tablet (10 mg total) by mouth daily. 10/28/16  Yes Rhyne, Samantha J, PA-C  cefTRIAXone 2 g in dextrose 5 % 50 mL Inject 2 g into the vein once.   Yes [provider]  Cholecalciferol 1000 units tablet Take 1 tablet (1,000 Units total) by mouth daily. 08/31/15  Yes Ngetich, Dinah C, NP  clopidogrel (PLAVIX) 75 MG tablet Take 1 tablet (75 mg total) by mouth daily with breakfast. 10/29/16  Yes Rhyne, Samantha J, PA-C  collagenase (SANTYL) ointment Apply 1 application topically daily.   Yes [provider]  ferrous sulfate 325 (65 FE) MG tablet Take 325 mg by mouth daily with breakfast.    Yes [provider]  methimazole (TAPAZOLE) 10 MG tablet Take 10 mg by mouth 3 (three) times daily.   Yes [provider]  metoprolol tartrate (LOPRESSOR) 25 MG tablet Take 0.5 tablets (12.5 mg total) by mouth 2 (two) times daily. 12/05/16  Yes Randel Pigg, Dorma Russell, MD  metroNIDAZOLE (FLAGYL) 500 MG tablet Take 500 mg by mouth 3 (three) times daily.   Yes [provider]  Multiple Vitamin (MULTIVITAMIN) tablet Take 1 tablet by mouth daily.   Yes [provider]  oxyCODONE-acetaminophen (ROXICET) 5-325 MG tablet Take 1 tablet by mouth every 4 (four) hours as needed for severe pain (Prior to dressing changes 30 minutes before). 12/05/16  Yes Randel Pigg, Dorma Russell, MD  polyethylene glycol powder [facility]) powder Take 17 g by mouth daily. Mix in 4-8 ounces of Orange Juice and take by mouth daily for constipation. 07/04/12  Yes Lucrezia Starch, NP  Rivastigmine (EXELON) 13.3 MG/24HR PT24 Place 1 patch (13.3 mg total) onto the skin daily. 07/04/12  Yes Lucrezia Starch, NP    Family History Family History  Problem Relation Age of Onset  . Thyroid disease Neg Hx     Social History Social History   Tobacco Use  . Smoking status: Former Smoker    Packs/day: 0.50    Years: 40.00    Pack years: 20.00  . Smokeless tobacco: Never Used  Substance Use  Topics  . Alcohol use: No  . Drug use: No     Allergies   Patient has no known allergies.   Review of Systems Review of Systems  Unable to perform ROS: Mental status change     Physical Exam Updated Vital Signs BP (!) 138/59 (BP Location: Right Wrist)   Pulse 64   Resp 14   SpO2 98%   Physical Exam  Nursing note and vitals reviewed.  Morbidly obese 81 year old female, resting comfortably and in no acute distress. Vital signs are normal. Oxygen saturation is 98%, which is normal. Head is normocephalic and atraumatic. PERRLA.  Movements are roving, but she does not focus on anything.  She does not blink to threat. Oropharynx is clear. Neck is supple without adenopathy or JVD. Lungs are clear without rales, wheezes, or rhonchi. Chest moves symmetrically. Heart has regular rate and rhythm without murmur. Abdomen is soft, flat, without masses or hepatosplenomegaly and peristalsis is normoactive. Extremities: Ulcer on the lateral aspect of the  left lower leg without significant drainage or erythema.  1+ edema on the left, no edema on the right. Skin is warm and dry without rash. Neurologic: She does not respond to verbal or painful stimuli.  ED Treatments / Results  Labs (all labs ordered are listed, but only abnormal results are displayed) Labs Reviewed  COMPREHENSIVE METABOLIC PANEL - Abnormal; Notable for the following components:      Result Value   Chloride 119 (*)    CO2 17 (*)    Glucose, Bld 62 (*)    BUN 21 (*)    Creatinine, Ser 1.81 (*)    Calcium 8.1 (*)    Total Protein 5.5 (*)    Albumin 1.7 (*)    Alkaline Phosphatase 345 (*)    GFR calc non Af Amer 25 (*)    GFR calc Af Amer 30 (*)    All other components within normal limits  CBC WITH DIFFERENTIAL/PLATELET - Abnormal; Notable for the following components:   RBC 3.61 (*)    Hemoglobin 9.5 (*)    HCT 30.5 (*)    RDW 23.1 (*)    All other components within normal limits  URINE CULTURE  CULTURE,  BLOOD (ROUTINE X 2)  CULTURE, BLOOD (ROUTINE X 2)  TROPONIN I  URINALYSIS, ROUTINE W REFLEX MICROSCOPIC  AMMONIA  I-STAT CG4 LACTIC ACID, ED  CBG MONITORING, ED  I-STAT CG4 LACTIC ACID, ED    EKG  EKG Interpretation  Date/Time:  Tuesday January 03 2017 22:34:56 EST Ventricular Rate:  69 PR Interval:    QRS Duration: 139 QT Interval:  444 QTC Calculation: 476 R Axis:   35 Text Interpretation:  AV dissociation Nonspecific intraventricular conduction delay Minimal ST depression, lateral leads Artifact in lead(s) I III aVR aVL aVF V1 V2 V3 V4 When compared with ECG of 10/20/2016, No significant change was found Confirmed by Dione Booze ([id number]) on 01/03/2017 10:41:06 PM       Radiology Ct Head Wo Contrast  Result Date: 01/03/2017 CLINICAL DATA:  Decreased mental status over the past 24 hours. Altered level consciousness. EXAM: CT HEAD WITHOUT CONTRAST TECHNIQUE: Contiguous axial images were obtained from the base of the skull through the vertex without intravenous contrast. COMPARISON:  11/26/2004 FINDINGS: BRAIN: There is mild-to-moderate central atrophy. No intraparenchymal hemorrhage, mass effect nor midline shift. Mild periventricular white matter hypodensities consistent with chronic small vessel ischemic disease is noted. Chronic left external capsule infarct along its posterior aspect as before. No acute large vascular territory infarcts. No abnormal extra-axial fluid collections. Basal cisterns are not effaced and midline. VASCULAR: Moderate calcific atherosclerosis of the carotid siphons. SKULL: No skull fracture. No significant scalp soft tissue swelling. SINUSES/ORBITS: The mastoid air-cells are clear. The included paranasal sinuses are well-aerated.The included ocular globes and orbital contents are non-suspicious. OTHER: None. IMPRESSION: Mild-to-moderate central atrophy with chronic small vessel ischemic disease. Chronic left external capsule infarct. No acute intracranial  abnormality. Electronically Signed   By: Tollie Eth M.D.   On: 01/03/2017 23:12   Dg Chest Port 1 View  Result Date: 01/03/2017 CLINICAL DATA:  Altered mental status EXAM: PORTABLE CHEST 1 VIEW COMPARISON:  06/06/2016 FINDINGS: Stable cardiomegaly with moderate aortic atherosclerosis. Mild vascular congestion and redistribution. No significant effusion. No pneumothorax. Scarring and/or atelectasis at the left costophrenic angle. No acute osseous appearing abnormality. Mild dextroconvex curvature of the upper thoracic spine. IMPRESSION: Cardiomegaly with mild pulmonary vascular congestion. Aortic atherosclerosis without aneurysm. Electronically Signed   By: Tollie Eth  M.D.   On: 01/03/2017 22:17    Procedures Procedures (including critical care time)  Medications Ordered in ED Medications  dextrose 50 % solution 50 mL (50 mLs Intravenous Given 01/03/17 2347)     Initial Impression / Assessment and Plan / ED Course  I have reviewed the triage vital signs and the nursing notes.  Pertinent labs & imaging results that were available during my care of the patient were reviewed by me and considered in my medical decision making (see chart for details).  Altered mental status of uncertain cause.  Old records are reviewed confirming hospitalization for altered mental status with hypernatremia and urinary tract infection 2 weeks ago.  Septic workup is initiated.  Will not start antibiotics since there is no fever.  Initial workup shows no clear cause for her altered mentation.  CT of head is unchanged.  No evidence of pneumonia.  There has been mild increase in creatinine which is probably not clinically significant.  Metabolic acidosis is present with normal anion gap, but has been present previously.  She is noted to be mildly hypoglycemic, but this is unlikely to cause the drastic change in mental status.  She is given intravenous dextrose.  Urinalysis is pending.  Case is discussed with Dr. Katrinka BlazingSmith  of [facility] who agrees to admit the patient.  He does request ammonia level be checked.  Final Clinical Impressions(s) / ED Diagnoses   Final diagnoses:  Altered mental status, unspecified altered mental status type  Normochromic normocytic anemia  Renal insufficiency  Elevated serum alkaline phosphatase level  Metabolic acidosis  Hypoglycemia    ED Discharge Orders    None       Dione BoozeGlick, Yolande Skoda, MD 01/04/17 [phone]

## 2017-01-03 NOTE — ED Notes (Signed)
Patient transported to CT 

## 2017-01-04 ENCOUNTER — Encounter (HOSPITAL_COMMUNITY): Payer: Self-pay

## 2017-01-04 ENCOUNTER — Other Ambulatory Visit: Payer: Self-pay

## 2017-01-04 ENCOUNTER — Observation Stay (HOSPITAL_COMMUNITY): Payer: Medicare Other

## 2017-01-04 DIAGNOSIS — N184 Chronic kidney disease, stage 4 (severe): Secondary | ICD-10-CM | POA: Diagnosis present

## 2017-01-04 DIAGNOSIS — E11649 Type 2 diabetes mellitus with hypoglycemia without coma: Secondary | ICD-10-CM | POA: Diagnosis present

## 2017-01-04 DIAGNOSIS — Z7982 Long term (current) use of aspirin: Secondary | ICD-10-CM | POA: Diagnosis not present

## 2017-01-04 DIAGNOSIS — G9340 Encephalopathy, unspecified: Secondary | ICD-10-CM | POA: Diagnosis not present

## 2017-01-04 DIAGNOSIS — D638 Anemia in other chronic diseases classified elsewhere: Secondary | ICD-10-CM | POA: Diagnosis not present

## 2017-01-04 DIAGNOSIS — E119 Type 2 diabetes mellitus without complications: Secondary | ICD-10-CM | POA: Diagnosis not present

## 2017-01-04 DIAGNOSIS — H5500 Unspecified nystagmus: Secondary | ICD-10-CM | POA: Diagnosis present

## 2017-01-04 DIAGNOSIS — I129 Hypertensive chronic kidney disease with stage 1 through stage 4 chronic kidney disease, or unspecified chronic kidney disease: Secondary | ICD-10-CM | POA: Diagnosis present

## 2017-01-04 DIAGNOSIS — E162 Hypoglycemia, unspecified: Secondary | ICD-10-CM | POA: Diagnosis present

## 2017-01-04 DIAGNOSIS — R748 Abnormal levels of other serum enzymes: Secondary | ICD-10-CM | POA: Diagnosis not present

## 2017-01-04 DIAGNOSIS — Z95820 Peripheral vascular angioplasty status with implants and grafts: Secondary | ICD-10-CM | POA: Diagnosis not present

## 2017-01-04 DIAGNOSIS — D631 Anemia in chronic kidney disease: Secondary | ICD-10-CM | POA: Diagnosis present

## 2017-01-04 DIAGNOSIS — E1122 Type 2 diabetes mellitus with diabetic chronic kidney disease: Secondary | ICD-10-CM | POA: Diagnosis present

## 2017-01-04 DIAGNOSIS — E46 Unspecified protein-calorie malnutrition: Secondary | ICD-10-CM | POA: Diagnosis present

## 2017-01-04 DIAGNOSIS — Z6841 Body Mass Index (BMI) 40.0 and over, adult: Secondary | ICD-10-CM | POA: Diagnosis not present

## 2017-01-04 DIAGNOSIS — E11622 Type 2 diabetes mellitus with other skin ulcer: Secondary | ICD-10-CM | POA: Diagnosis present

## 2017-01-04 DIAGNOSIS — E870 Hyperosmolality and hypernatremia: Secondary | ICD-10-CM | POA: Diagnosis present

## 2017-01-04 DIAGNOSIS — Z8673 Personal history of transient ischemic attack (TIA), and cerebral infarction without residual deficits: Secondary | ICD-10-CM | POA: Diagnosis not present

## 2017-01-04 DIAGNOSIS — G9341 Metabolic encephalopathy: Secondary | ICD-10-CM | POA: Diagnosis present

## 2017-01-04 DIAGNOSIS — Z7401 Bed confinement status: Secondary | ICD-10-CM | POA: Diagnosis not present

## 2017-01-04 DIAGNOSIS — Z515 Encounter for palliative care: Secondary | ICD-10-CM | POA: Diagnosis not present

## 2017-01-04 DIAGNOSIS — L97909 Non-pressure chronic ulcer of unspecified part of unspecified lower leg with unspecified severity: Secondary | ICD-10-CM | POA: Diagnosis not present

## 2017-01-04 DIAGNOSIS — A419 Sepsis, unspecified organism: Secondary | ICD-10-CM | POA: Diagnosis present

## 2017-01-04 DIAGNOSIS — L97928 Non-pressure chronic ulcer of unspecified part of left lower leg with other specified severity: Secondary | ICD-10-CM | POA: Diagnosis present

## 2017-01-04 DIAGNOSIS — Z79899 Other long term (current) drug therapy: Secondary | ICD-10-CM | POA: Diagnosis not present

## 2017-01-04 DIAGNOSIS — Z66 Do not resuscitate: Secondary | ICD-10-CM | POA: Diagnosis present

## 2017-01-04 DIAGNOSIS — F0390 Unspecified dementia without behavioral disturbance: Secondary | ICD-10-CM | POA: Diagnosis present

## 2017-01-04 DIAGNOSIS — R4182 Altered mental status, unspecified: Secondary | ICD-10-CM | POA: Diagnosis not present

## 2017-01-04 DIAGNOSIS — E1151 Type 2 diabetes mellitus with diabetic peripheral angiopathy without gangrene: Secondary | ICD-10-CM | POA: Diagnosis present

## 2017-01-04 DIAGNOSIS — E872 Acidosis: Secondary | ICD-10-CM | POA: Diagnosis present

## 2017-01-04 DIAGNOSIS — Z87891 Personal history of nicotine dependence: Secondary | ICD-10-CM | POA: Diagnosis not present

## 2017-01-04 LAB — BASIC METABOLIC PANEL
Anion gap: 6 (ref 5–15)
BUN: 19 mg/dL (ref 6–20)
CHLORIDE: 120 mmol/L — AB (ref 101–111)
CO2: 17 mmol/L — ABNORMAL LOW (ref 22–32)
Calcium: 8 mg/dL — ABNORMAL LOW (ref 8.9–10.3)
Creatinine, Ser: 1.63 mg/dL — ABNORMAL HIGH (ref 0.44–1.00)
GFR calc Af Amer: 33 mL/min — ABNORMAL LOW (ref >60)
GFR, EST NON AFRICAN AMERICAN: 29 mL/min — AB (ref >60)
GLUCOSE: 102 mg/dL — AB (ref 65–99)
Potassium: 3.4 mmol/L — ABNORMAL LOW (ref 3.5–5.1)
Sodium: 143 mmol/L (ref 135–145)

## 2017-01-04 LAB — CBC WITH DIFFERENTIAL/PLATELET
BASOS PCT: 0 %
Basophils Absolute: 0 10*3/uL (ref 0.0–0.1)
EOS ABS: 0.1 10*3/uL (ref 0.0–0.7)
Eosinophils Relative: 1 %
HEMATOCRIT: 27.6 % — AB (ref 36.0–46.0)
HEMOGLOBIN: 8.5 g/dL — AB (ref 12.0–15.0)
LYMPHS ABS: 1.6 10*3/uL (ref 0.7–4.0)
LYMPHS PCT: 14 %
MCH: 25.9 pg — AB (ref 26.0–34.0)
MCHC: 30.8 g/dL (ref 30.0–36.0)
MCV: 84.1 fL (ref 78.0–100.0)
MONOS PCT: 7 %
Monocytes Absolute: 0.8 10*3/uL (ref 0.1–1.0)
NEUTROS ABS: 8.8 10*3/uL — AB (ref 1.7–7.7)
Neutrophils Relative %: 78 %
Platelets: 302 10*3/uL (ref 150–400)
RBC: 3.28 MIL/uL — ABNORMAL LOW (ref 3.87–5.11)
RDW: 22.8 % — AB (ref 11.5–15.5)
WBC: 11.3 10*3/uL — ABNORMAL HIGH (ref 4.0–10.5)

## 2017-01-04 LAB — URINALYSIS, ROUTINE W REFLEX MICROSCOPIC
Bilirubin Urine: NEGATIVE
Glucose, UA: 50 mg/dL — AB
HGB URINE DIPSTICK: NEGATIVE
Ketones, ur: NEGATIVE mg/dL
Leukocytes, UA: NEGATIVE
Nitrite: NEGATIVE
PH: 5 (ref 5.0–8.0)
Protein, ur: NEGATIVE mg/dL
SPECIFIC GRAVITY, URINE: 1.012 (ref 1.005–1.030)

## 2017-01-04 LAB — CBG MONITORING, ED
GLUCOSE-CAPILLARY: 105 mg/dL — AB (ref 65–99)
GLUCOSE-CAPILLARY: 134 mg/dL — AB (ref 65–99)
GLUCOSE-CAPILLARY: 87 mg/dL (ref 65–99)
GLUCOSE-CAPILLARY: 96 mg/dL (ref 65–99)
Glucose-Capillary: 103 mg/dL — ABNORMAL HIGH (ref 65–99)
Glucose-Capillary: 113 mg/dL — ABNORMAL HIGH (ref 65–99)
Glucose-Capillary: 63 mg/dL — ABNORMAL LOW (ref 65–99)

## 2017-01-04 LAB — C-REACTIVE PROTEIN: CRP: 10.1 mg/dL — AB (ref <1.0)

## 2017-01-04 LAB — GLUCOSE, CAPILLARY
Glucose-Capillary: 63 mg/dL — ABNORMAL LOW (ref 65–99)
Glucose-Capillary: 99 mg/dL (ref 65–99)
Glucose-Capillary: 77 mg/dL (ref 65–99)

## 2017-01-04 LAB — MRSA PCR SCREENING: MRSA by PCR: NEGATIVE

## 2017-01-04 LAB — I-STAT CG4 LACTIC ACID, ED: LACTIC ACID, VENOUS: 1.43 mmol/L (ref 0.5–1.9)

## 2017-01-04 LAB — AMMONIA: AMMONIA: 42 umol/L — AB (ref 9–35)

## 2017-01-04 LAB — PREALBUMIN: PREALBUMIN: 10.5 mg/dL — AB (ref 18–38)

## 2017-01-04 MED ORDER — SODIUM CHLORIDE 0.9 % IV SOLN
INTRAVENOUS | Status: DC
  Administered 2017-01-04: 1000 mL via INTRAVENOUS

## 2017-01-04 MED ORDER — VANCOMYCIN HCL 10 G IV SOLR
2500.0000 mg | Freq: Once | INTRAVENOUS | Status: AC
  Administered 2017-01-04: 2500 mg via INTRAVENOUS
  Filled 2017-01-04: qty 2500

## 2017-01-04 MED ORDER — ONDANSETRON HCL 4 MG PO TABS: 4.0000 mg | ORAL_TABLET | Freq: Four times a day (QID) | ORAL | Status: DC | PRN

## 2017-01-04 MED ORDER — THIAMINE HCL 100 MG/ML IJ SOLN
500.0000 mg | Freq: Three times a day (TID) | INTRAVENOUS | Status: DC
  Administered 2017-01-05 – 2017-01-07 (×7): 500 mg via INTRAVENOUS
  Filled 2017-01-04 (×8): qty 5

## 2017-01-04 MED ORDER — SODIUM CHLORIDE 0.9 % IV SOLN
1500.0000 mg | INTRAVENOUS | Status: DC
  Administered 2017-01-05 – 2017-01-07 (×3): 1500 mg via INTRAVENOUS
  Filled 2017-01-04 (×4): qty 1500

## 2017-01-04 MED ORDER — DEXTROSE-NACL 5-0.9 % IV SOLN
INTRAVENOUS | Status: DC
  Administered 2017-01-04 – 2017-01-06 (×2): via INTRAVENOUS

## 2017-01-04 MED ORDER — LORAZEPAM 2 MG/ML IJ SOLN
INTRAMUSCULAR | Status: AC
  Filled 2017-01-04: qty 1

## 2017-01-04 MED ORDER — SODIUM CHLORIDE 0.9 % IV SOLN
500.0000 mg | Freq: Two times a day (BID) | INTRAVENOUS | Status: DC
  Administered 2017-01-04 – 2017-01-07 (×7): 500 mg via INTRAVENOUS
  Filled 2017-01-04 (×8): qty 5

## 2017-01-04 MED ORDER — ORAL CARE MOUTH RINSE
15.0000 mL | Freq: Two times a day (BID) | OROMUCOSAL | Status: DC
  Administered 2017-01-04 – 2017-01-06 (×5): 15 mL via OROMUCOSAL

## 2017-01-04 MED ORDER — SODIUM CHLORIDE 0.9 % IV SOLN
1500.0000 mg | Freq: Once | INTRAVENOUS | Status: AC
  Administered 2017-01-04: 1500 mg via INTRAVENOUS
  Filled 2017-01-04: qty 15

## 2017-01-04 MED ORDER — METRONIDAZOLE IN NACL 5-0.79 MG/ML-% IV SOLN
500.0000 mg | Freq: Three times a day (TID) | INTRAVENOUS | Status: DC
  Administered 2017-01-04 – 2017-01-07 (×9): 500 mg via INTRAVENOUS
  Filled 2017-01-04 (×10): qty 100

## 2017-01-04 MED ORDER — ACETAMINOPHEN 325 MG PO TABS: 650.0000 mg | ORAL_TABLET | Freq: Four times a day (QID) | ORAL | Status: DC | PRN

## 2017-01-04 MED ORDER — HEPARIN SODIUM (PORCINE) 5000 UNIT/ML IJ SOLN
5000.0000 [IU] | Freq: Three times a day (TID) | INTRAMUSCULAR | Status: DC
  Administered 2017-01-04 – 2017-01-07 (×9): 5000 [IU] via SUBCUTANEOUS
  Filled 2017-01-04 (×9): qty 1

## 2017-01-04 MED ORDER — THIAMINE HCL 100 MG/ML IJ SOLN
500.0000 mg | Freq: Three times a day (TID) | INTRAVENOUS | Status: DC
  Administered 2017-01-04: 500 mg via INTRAVENOUS
  Filled 2017-01-04 (×2): qty 5

## 2017-01-04 MED ORDER — LORAZEPAM 2 MG/ML IJ SOLN
1.0000 mg | Freq: Once | INTRAMUSCULAR | Status: AC
  Administered 2017-01-04: 1 mg via INTRAVENOUS

## 2017-01-04 MED ORDER — DEXTROSE 5 % IV SOLN
2.0000 g | INTRAVENOUS | Status: DC
  Administered 2017-01-05 – 2017-01-07 (×3): 2 g via INTRAVENOUS
  Filled 2017-01-04 (×3): qty 2

## 2017-01-04 MED ORDER — DEXTROSE 50 % IV SOLN
INTRAVENOUS | Status: AC
  Administered 2017-01-04: 25 mL
  Filled 2017-01-04: qty 50

## 2017-01-04 MED ORDER — IPRATROPIUM-ALBUTEROL 0.5-2.5 (3) MG/3ML IN SOLN: 3.0000 mL | RESPIRATORY_TRACT | Status: DC | PRN

## 2017-01-04 MED ORDER — ACETAMINOPHEN 650 MG RE SUPP: 650.0000 mg | Freq: Four times a day (QID) | RECTAL | Status: DC | PRN

## 2017-01-04 MED ORDER — DEXTROSE 5 % IV SOLN
2.0000 g | Freq: Once | INTRAVENOUS | Status: DC
  Filled 2017-01-04: qty 2

## 2017-01-04 MED ORDER — LORAZEPAM 2 MG/ML IJ SOLN
0.5000 mg | INTRAMUSCULAR | Status: AC
  Administered 2017-01-04: 0.5 mg via INTRAVENOUS
  Filled 2017-01-04: qty 1

## 2017-01-04 MED ORDER — ONDANSETRON HCL 4 MG/2ML IJ SOLN: 4.0000 mg | Freq: Four times a day (QID) | INTRAMUSCULAR | Status: DC | PRN

## 2017-01-04 NOTE — Progress Notes (Signed)
EEG report reviewed. The EEG is abnormal due to moderate diffuse background slowing, which indicates diffuse cerebral dysfunction that is non-specific in etiology and can be seen with hypoxic/ischemic injury, toxic/metabolic encephalopathies, neurodegenerative disorders, or medication effect.  No epileptiform discharges are noted.   MRI brain conclusions: 1. Study is limited due to moderate motion degradation despite repeated imaging attempts. 2. Confluent but nonspecific abnormal T2 and FLAIR signal in the bilateral splenium of the corpus callosum and the bilateral midbrain. Diffusion is mildly facilitated suggesting this represents edema and not infarcts. Midbrain signal abnormality can occur in Wernicke Encephalopathy, and there is questionable associated mamillary body abnormality on these images, which if genuine which strongly suggest the diagnosis. Recommend correlation for Thiamine deficiency. Splenium signal abnormality can be seen as the sequelae of seizure activity and also metabolic disorders. 3. 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 4. Confluent but nonspecific chronic appearing hemorrhage in the right temporal lobe. 5. Additional small vessel disease type hemorrhage in the left external capsule. Chronic lacune in the right anterior corona radiata.  ASSESSMENT AND PLAN  81 y.o. female African-American prior history of posterior circulation stroke, dementia, 6 CTD, diabetes mellitus who lives in a nursing home brought to the emergency room after being found by the nurse around 5 PM to be nonverbal.  She was found to be hypoglycemic and received D50 with no improvement of mental status.   1. Acute Encephalopathy. May have been secondary to relatively prolonged period of hypoglycemia at home versus an unwitnessed seizure with postictal state. Benefit/risk profile favors continuing scheduled Keppra at 500 mg BID. Continue  seizure precautions.  2. Given lower extremity acute on chronic ulcerations, which could be a source of infection, an encephalopathy/delirium secondary to infection in a patient with dementia would also be a consideration. CRP is elevated and she has a mild leukocytosis. Agree with decision to treat with antibiotics.  3. Acute stroke has been ruled out with MRI, but findings suggestive of Wernicke's encephalopathy are noted. Starting high dose thiamine protocol at 500 mg IV TID x 3 days (ordered). This should be followed by 250 mg IV TID x 3 additional days and then 100 mg po or IV qd thereafter.  4. Elevated ammonia of 42. Hyperammonemic encephalopathy is also on the DDx.  5. If there is no clinical improvement over the next 24-48 hours or if the patient exhibits further seizure activity, call Neurology for further recommendations.   Electronically signed: Dr. Caryl PinaEric Hadas Jessop

## 2017-01-04 NOTE — ED Notes (Signed)
Dr. Laurence SlateAroor at the bedside.

## 2017-01-04 NOTE — Care Management Note (Signed)
Case Management Note  Patient Details  Name: Gala Murdochvelyn B Anderle MRN: [id number] Date of Birth: 1935/12/25  Subjective/Objective:                  81 y.o. female African-American prior history of posterior circulation stroke, dementia, 6 CTD, diabetes mellitus who lives in a nursing home brought to the emergency room after being found by the nurse around 5 PM to be nonverbal.  Action/Plan:  Admit status OBS (Acute Encephalopathy); [facility] discharge [facility]  Expected Discharge Date:      unknown            Expected Discharge Plan:  Assisted Living / Rest Home([facility])  In-House Referral:  Clinical Social Work  Discharge planning Services  CM Consult  Status of Service:  In process, will continue to follow  If discussed at Long Length of Stay Meetings, dates discussed:    Additional Comments:  Oletta CohnWood, Lexa Coronado, RN 01/04/2017, 9:03 AM

## 2017-01-04 NOTE — Consult Note (Signed)
Neurology Consultation Reason for Consult: Possible seizure, encephalopathy Referring Physician: Dr. Katrinka BlazingSmith    History is obtained from: Chart review and daughter   HPI: Sylvia Medina is a 81 y.o. female African-American prior history of posterior circulation stroke, dementia, 6 CTD, diabetes mellitus who lives in a nursing home brought to the emergency room after being found by the nurse around 5 PM to be nonverbal.  At baseline the patient is bedbound from her prior stroke, however can talk and feed herself. The daughter states that on Friday she has slurred speech and paramedics felt that this is not stroke. Her symptoms have improved and by Monday should return to her baseline per daughter. Is unclear as to exact last known normal on Tuesday but nurse noticed 5.30  PM the patient was unresponsive. She had just received pain medications for being agitated in the afternoon. EMS was notified and noticed her glucose was low at 61. She received dextrose, however patient's mental status had not improved. She was evaluated by ER who felt her encephalopathy was multifactorial and she was admitted to medicine team  Dr. Katrinka BlazingSmith on evaluating the patient noticed she had an episode lasting for 1 minute where she stared off into space and had posturing of her right hand. Her symptoms resolve spontaneously. Neurology was called to assess the patient for possible seizures.   On assessing the patient at 4:00 am the patient was nonverbal however with somewhat track eyes to verbal stimulus. She did not follow any commands. Then she had an episode around 4:15 where she turned her head towards the right side, had forced gaze deviation with mild nystagmus. She then raised her right arm followed by closed flexion posturing of both arms. This entire episode lasts for 1 minute. I attempted to distract the patient with no response.    ROS: A 14 point ROS was performed and is negative except as noted in the HPI.   Past  Medical History:  Diagnosis Date  . Anemia 07/04/2012  . Cardiac arrhythmia 07/04/2012  . CKD (chronic kidney disease) stage 4, GFR 15-29 ml/min (HCC)   . CVA (cerebral vascular accident) (HCC) 07/04/2012  . Dementia 07/04/2012  . Diabetes mellitus type 2, diet-controlled (HCC) 07/04/2012  . Encephalopathy 11/17/2016  . Hyperthyroidism   . Unspecified constipation 07/04/2012  . Unspecified vitamin D deficiency 07/04/2012     Family History  Problem Relation Age of Onset  . Thyroid disease Neg Hx      Social History:  reports that she has quit smoking. She has a 20.00 pack-year smoking history. she has never used smokeless tobacco. She reports that she does not drink alcohol or use drugs.   Exam: Current vital signs: BP 128/74   Pulse 95   Resp 19   SpO2 95%  Vital signs in last 24 hours: Pulse Rate:  [64-102] 95 (11/21 0345) Resp:  [13-40] 19 (11/21 0345) BP: (101-150)/(59-97) 128/74 (11/21 0345) SpO2:  [95 %-100 %] 95 % (11/21 0345)   Physical Exam  Constitutional: obese, awake, Eyes: No scleral injection HENT: No OP obstrucion Head: Normocephalic.  Cardiovascular: Normal rate and regular rhythm.  Respiratory: Effort normal and breath sounds normal to anterior ascultation GI: Soft.  No distension. There is no tenderness.  Skin: WDI Extremities; bilateral lower extremities edematous  Neuro: Mental Status: Patient is awake, non verbal, not following commands  Cranial Nerves: II: Visual Fields : blinks to threat III,IV, VI: EOMI intact, however patient had episode of forced right side  gaze deviation   VII: Face symmetric  XII: tongue is midline without atrophy or fasciculations.  Motor: Increased tone in both upper extremities, antigravity Sensory: Unable to assess due to mental status Deep Tendon Reflexes: 1+ over bilateral biceps, unable to perform lower extremity reflexes Plantars: Unable to assess as both legs have braces  Cerebellar: Unable to  assess   ASSESSMENT AND PLAN 81 y.o. female African-American prior history of posterior circulation stroke, dementia, 6 CTD, diabetes mellitus who lives in a nursing home brought to the emergency room after being found by the nurse around 5 PM to be nonverbal.  Should found to be hypoglycemic and received D50 with no improvement of mental status. Metabolic workup pending however unlikely to explain current mental status.  Acute Encephalopathy Suspected seizure  Possible stroke    Plan Ativan 1 g stat Load with Keppra 1.5 g stat Seizure precautions MRI Brain stat EEG stat  Neurochecks Q2h    Will continue to follow.     Georgiana SpinnerSushanth Bryar Rennie MD [facility] [phone]  If 7pm to 7am, please call on call as listed on AMION.

## 2017-01-04 NOTE — Progress Notes (Signed)
EEG complete - results pending 

## 2017-01-04 NOTE — Progress Notes (Signed)
CSW acknowledges consult. Please consult RN CM for medication assistance. Please reconsult if new need arises.  CSW signing off.      Claude MangesKierra S. Noell Lorensen, MSW, LCSW-A Emergency Department Clinical Social Worker [phone]

## 2017-01-04 NOTE — ED Notes (Signed)
Wound care completed on rt foot, left heel and bilateral toesper wound rn request and specifications. Awaiting dressing for left lateral leg to complete

## 2017-01-04 NOTE — ED Notes (Signed)
MD at bedside. Pt arms were contracted towards inside of body. Ativan ordered.

## 2017-01-04 NOTE — Procedures (Signed)
ELECTROENCEPHALOGRAM REPORT  Date of Study: 01/04/2017  Patient's Name: Sylvia Medina MRN: [id number] Date of Birth: [birth year]  Referring Provider: Dr. Georgiana SpinnerSushanth Aroor  Clinical History: This is a 81 year old woman with altered mental status, non-verbal.  Medications: levETIRAcetam (KEPPRA) 500 mg in sodium chloride 0.9 % 100 mL IVPB  LORazepam (ATIVAN) 2 MG/ML injection  acetaminophen (TYLENOL) tablet 650 mg  ceFEPIme (MAXIPIME) 2 g in dextrose 5 % 50 mL IVPB  ceFEPIme (MAXIPIME) 2 g in dextrose 5 % 50 mL IVPB  heparin injection 5,000 Units  ipratropium-albuterol (DUONEB) 0.5-2.5 (3) MG/3ML nebulizer solution 3 mL  metroNIDAZOLE (FLAGYL) IVPB 500 mg  vancomycin (VANCOCIN) 1,500 mg in sodium chloride 0.9 % 500 mL IVPB  vancomycin (VANCOCIN) 2,500 mg in sodium chloride 0.9 % 500 mL IVPB   Technical Summary: A multichannel digital EEG recording measured by the international 10-20 system with electrodes applied with paste and impedances below 5000 ohms performed as portable with EKG monitoring in an unresponsive patient.  Hyperventilation and photic stimulation were not performed.  The digital EEG was referentially recorded, reformatted, and digitally filtered in a variety of bipolar and referential montages for optimal display.   Description: The patient is unresponsive during the recording. There is no clear posterior dominant rhythm. The background consists of a large amount of diffuse 4-5 Hz theta and 2-3 Hz delta slowing.  During drowsiness and sleep, there is an increase in theta and delta slowing of the background. Normal sleep architecture is not seen.  Hyperventilation and photic stimulation were not performed. With noxious stimulation, there is an increase in faster frequencies and muscle artifact. There were no epileptiform discharges or electrographic seizures seen.    EKG lead was unremarkable.  Impression: This EEG is abnormal due to moderate diffuse background  slowing.  Clinical Correlation of the above findings indicates diffuse cerebral dysfunction that is non-specific in etiology and can be seen with hypoxic/ischemic injury, toxic/metabolic encephalopathies, neurodegenerative disorders, or medication effect.  The absence of epileptiform discharges does not rule out a clinical diagnosis of epilepsy.  Clinical correlation is advised.   [name], M.D.

## 2017-01-04 NOTE — Progress Notes (Signed)
Pharmacy Antibiotic Note  Sylvia Medina is a 81 y.o. female admitted on 01/03/2017 with diabetic foot infection.  Pharmacy has been consulted for vancomycin and cefepime dosing.  Plan: Vancomycin 2500mg  x1 then 1500mg  IV every 24 hours.  Goal trough 15-20 mcg/mL.  Cefepime 2g IV every 24 hours.   Recent Labs  Lab 01/03/17 2132 01/03/17 2155 01/04/17 0027  WBC 8.9  --   --   CREATININE 1.81*  --   --   LATICACIDVEN  --  1.60 1.43    Estimated Creatinine Clearance: 33.9 mL/min (A) (by C-G formula based on SCr of 1.81 mg/dL (H)).    No Known Allergies   Thank you for allowing pharmacy to be a part of this patient's care.  Vernard GamblesVeronda Medford Staheli, PharmD, BCPS  01/04/2017 5:28 AM

## 2017-01-04 NOTE — H&P (Addendum)
History and Physical    Sylvia Medina ZOX:[id number] DOB: 1935/02/20 DOA: 01/03/2017  Referring MD/NP/PA: Dr. Preston Fleeting  PCP: Patient, No Pcp Per  Patient coming from:SNF   Chief Complaint: Altered  I have personally briefly reviewed patient's old medical records in [facility]  HPI: Sylvia Medina is a 81 y.o. female with medical history significant of dementia, HTN, H/O CVA, CKD stage IV, chronic normocytic anemia, chronic decubitus ulcer, PVD s/p right popliteal stenting, osteomyelitis, hyperthyroidism; who presented after being found to be acutely altered.   Patient was last noted to be at baseline on 11/19 for which family notes patient is able to appropriately interact and speak although speech is slurred.  Now patient will not interact and has not been speaking.  Family notes that the patient is prone to urinary tract infections.  They did not note any recent reports of fevers, cough, abdominal pain, nausea, or vomiting.  She has no known history of seizure disorder. Patient previously hospitalized from 10/4-10/22, for osteomyelitis treated with IV antibiotics for which the patient just recently completed.  Subsequently patient was readmitted into the hospital on 11/6-11/8 for hyponatremia related to dehydration.   ED Course: Admission into the emergency department patient was noted to be afebrile, pulse 64-102, respirations 13-30, blood pressure 101/59-140/65,, and O2 saturations maintained.  Labs revealed revealed WBC 8.9, hemoglobin 9.5, BUN 21, creatinine 1.81, alkaline phosphatase 345, albumin 1.7, and ammonia 42.  CT scan of the brain showed no acute abnormalities to account for patient's symptoms.  Patient has been given a amp of D50 for low blood sugars.  Urinalysis was noted to be negative for any acute signs of infection.  TRH called to admit.  Review of Systems  Unable to perform ROS: Mental status change     Past Medical History:  Diagnosis Date  . Anemia 07/04/2012    . Cardiac arrhythmia 07/04/2012  . CKD (chronic kidney disease) stage 4, GFR 15-29 ml/min (HCC)   . CVA (cerebral vascular accident) (HCC) 07/04/2012  . Dementia 07/04/2012  . Diabetes mellitus type 2, diet-controlled (HCC) 07/04/2012  . Encephalopathy 11/17/2016  . Hyperthyroidism   . Unspecified constipation 07/04/2012  . Unspecified vitamin D deficiency 07/04/2012    Past Surgical History:  Procedure Laterality Date  . ABDOMINAL AORTOGRAM W/LOWER EXTREMITY N/A 10/28/2016   Procedure: ABDOMINAL AORTOGRAM W/LOWER EXTREMITY;  Surgeon: Sherren Kerns, MD;  Location: [facility] INVASIVE CV LAB;  Service: Cardiovascular;  Laterality: N/A;  . IR FLUORO GUIDE CV LINE RIGHT  12/02/2016  . IR US GUIDE VASC ACCESS RIGHT  12/02/2016  . PERIPHERAL VASCULAR INTERVENTION Right 10/28/2016   Procedure: PERIPHERAL VASCULAR INTERVENTION;  Surgeon: Sherren Kerns, MD;  Location: [facility] INVASIVE CV LAB;  Service: Cardiovascular;  Laterality: Right;  Right SFA and Popliteal     reports that she has quit smoking. She has a 20.00 pack-year smoking history. she has never used smokeless tobacco. She reports that she does not drink alcohol or use drugs.  No Known Allergies  Family History  Problem Relation Age of Onset  . Thyroid disease Neg Hx     Prior to Admission medications   Medication Sig Start Date End Date Taking? Authorizing Provider  acetaminophen (TYLENOL) 325 MG tablet Take 650 mg by mouth every 6 (six) hours as needed.   Yes [provider]  Amino Acids-Protein Hydrolys (FEEDING SUPPLEMENT, PRO-STAT SUGAR FREE 64,) LIQD Take 30 mLs by mouth 2 (two) times daily as needed.    Yes  [provider]  aspirin EC 81 MG tablet Take 1 tablet (81 mg total) by mouth daily. 10/28/16  Yes Rhyne, Samantha J, PA-C  atorvastatin (LIPITOR) 10 MG tablet Take 1 tablet (10 mg total) by mouth daily. 10/28/16  Yes Rhyne, Samantha J, PA-C  cefTRIAXone 2 g in dextrose 5 % 50 mL Inject 2 g into the vein once.    Yes [provider]  Cholecalciferol 1000 units tablet Take 1 tablet (1,000 Units total) by mouth daily. 08/31/15  Yes Ngetich, Dinah C, NP  clopidogrel (PLAVIX) 75 MG tablet Take 1 tablet (75 mg total) by mouth daily with breakfast. 10/29/16  Yes Rhyne, Samantha J, PA-C  collagenase (SANTYL) ointment Apply 1 application topically daily.   Yes [provider]  ferrous sulfate 325 (65 FE) MG tablet Take 325 mg by mouth daily with breakfast.    Yes [provider]  methimazole (TAPAZOLE) 10 MG tablet Take 10 mg by mouth 3 (three) times daily.   Yes [provider]  metoprolol tartrate (LOPRESSOR) 25 MG tablet Take 0.5 tablets (12.5 mg total) by mouth 2 (two) times daily. 12/05/16  Yes Randel PiggSilva Zapata, Dorma RussellEdwin, MD  metroNIDAZOLE (FLAGYL) 500 MG tablet Take 500 mg by mouth 3 (three) times daily.   Yes [provider]  Multiple Vitamin (MULTIVITAMIN) tablet Take 1 tablet by mouth daily.   Yes [provider]  oxyCODONE-acetaminophen (ROXICET) 5-325 MG tablet Take 1 tablet by mouth every 4 (four) hours as needed for severe pain (Prior to dressing changes 30 minutes before). 12/05/16  Yes Randel PiggSilva Zapata, Dorma RussellEdwin, MD  polyethylene glycol powder [facility](GLYCOLAX/MIRALAX) powder Take 17 g by mouth daily. Mix in 4-8 ounces of Orange Juice and take by mouth daily for constipation. 07/04/12  Yes Lucrezia StarchBerry, Edwina, NP  Rivastigmine (EXELON) 13.3 MG/24HR PT24 Place 1 patch (13.3 mg total) onto the skin daily. 07/04/12  Yes Lucrezia StarchBerry, Edwina, NP    Physical Exam:  Constitutional: Chronically ill appearing obese female who is not able to really follow commands at this time. Vitals:   01/04/17 0245 01/04/17 0300 01/04/17 0315 01/04/17 0345  BP: 125/90 131/65 (!) 145/75 128/74  Pulse: 94 91 91 95  Resp: 18 17 17 19   SpO2: 98% 95% 97% 95%   Eyes: PERRL, lids and conjunctivae normal ENMT: Mucous membranes are dry. Posterior pharynx clear of any exudate or lesions .edentulous Neck:  normal, supple, no masses, no thyromegaly Respiratory: clear to auscultation bilaterally, no wheezing, no crackles. Normal respiratory effort. No accessory muscle use.  Cardiovascular: Regular rate and rhythm, no murmurs / rubs / gallops. No extremity edema. 2+ pedal pulses. No carotid bruits.  Abdomen: no tenderness, no masses palpated. No hepatosplenomegaly. Bowel sounds positive.  Musculoskeletal: Decreased lower extremity tone and there is note of clubbing present Skin: Notable for multiple ulcerations of the bilateral lower extremities with some signs of erythema noted and purulent appearing drainage present.   Neurologic: During exam patient noted to began posturing with upper extremities drawn toward face and lip smacking. Psychiatric: Unable to assess as patient is not responding to verbal stimuli.    Labs on Admission: I have personally reviewed following labs and imaging studies  CBC: Recent Labs  Lab 01/03/17 2132  WBC 8.9  NEUTROABS 5.1  HGB 9.5*  HCT 30.5*  MCV 84.5  PLT 313   Basic Metabolic Panel: Recent Labs  Lab 01/03/17 2132  NA 141  K 3.8  CL 119*  CO2 17*  GLUCOSE 62*  BUN 21*  CREATININE 1.81*  CALCIUM 8.1*   GFR: Estimated Creatinine Clearance: 33.9 mL/min (A) (by C-G formula based on SCr of 1.81 mg/dL (H)). Liver Function Tests: Recent Labs  Lab 01/03/17 2132  AST 35  ALT 15  ALKPHOS 345*  BILITOT 0.8  PROT 5.5*  ALBUMIN 1.7*   No results for input(s): LIPASE, AMYLASE in the last 168 hours. Recent Labs  Lab 01/04/17 0008  AMMONIA 42*   Coagulation Profile: No results for input(s): INR, PROTIME in the last 168 hours. Cardiac Enzymes: Recent Labs  Lab 01/03/17 2137  TROPONINI <0.03   BNP (last 3 results) No results for input(s): PROBNP in the last 8760 hours. HbA1C: No results for input(s): HGBA1C in the last 72 hours. CBG: Recent Labs  Lab 01/03/17 2344 01/04/17 0009 01/04/17 0123 01/04/17 0339  GLUCAP 63* 134* 113* 103*    Lipid Profile: No results for input(s): CHOL, HDL, LDLCALC, TRIG, CHOLHDL, LDLDIRECT in the last 72 hours. Thyroid Function Tests: No results for input(s): TSH, T4TOTAL, FREET4, T3FREE, THYROIDAB in the last 72 hours. Anemia Panel: No results for input(s): VITAMINB12, FOLATE, FERRITIN, TIBC, IRON, RETICCTPCT in the last 72 hours. Urine analysis:    Component Value Date/Time   COLORURINE YELLOW 01/03/2017 2354   APPEARANCEUR CLEAR 01/03/2017 2354   LABSPEC 1.012 01/03/2017 2354   PHURINE 5.0 01/03/2017 2354   GLUCOSEU 50 (A) 01/03/2017 2354   HGBUR NEGATIVE 01/03/2017 2354   BILIRUBINUR NEGATIVE 01/03/2017 2354   KETONESUR NEGATIVE 01/03/2017 2354   PROTEINUR NEGATIVE 01/03/2017 2354   NITRITE NEGATIVE 01/03/2017 2354   LEUKOCYTESUR NEGATIVE 01/03/2017 2354   Sepsis Labs: No results found for this or any previous visit (from the past 240 hour(s)).   Radiological Exams on Admission: Ct Head Wo Contrast  Result Date: 01/03/2017 CLINICAL DATA:  Decreased mental status over the past 24 hours. Altered level consciousness. EXAM: CT HEAD WITHOUT CONTRAST TECHNIQUE: Contiguous axial images were obtained from the base of the skull through the vertex without intravenous contrast. COMPARISON:  11/26/2004 FINDINGS: BRAIN: There is mild-to-moderate central atrophy. No intraparenchymal hemorrhage, mass effect nor midline shift. Mild periventricular white matter hypodensities consistent with chronic small vessel ischemic disease is noted. Chronic left external capsule infarct along its posterior aspect as before. No acute large vascular territory infarcts. No abnormal extra-axial fluid collections. Basal cisterns are not effaced and midline. VASCULAR: Moderate calcific atherosclerosis of the carotid siphons. SKULL: No skull fracture. No significant scalp soft tissue swelling. SINUSES/ORBITS: The mastoid air-cells are clear. The included paranasal sinuses are well-aerated.The included ocular globes  and orbital contents are non-suspicious. OTHER: None. IMPRESSION: Mild-to-moderate central atrophy with chronic small vessel ischemic disease. Chronic left external capsule infarct. No acute intracranial abnormality. Electronically Signed   By: Tollie Eth M.D.   On: 01/03/2017 23:12   Dg Chest Port 1 View  Result Date: 01/03/2017 CLINICAL DATA:  Altered mental status EXAM: PORTABLE CHEST 1 VIEW COMPARISON:  06/06/2016 FINDINGS: Stable cardiomegaly with moderate aortic atherosclerosis. Mild vascular congestion and redistribution. No significant effusion. No pneumothorax. Scarring and/or atelectasis at the left costophrenic angle. No acute osseous appearing abnormality. Mild dextroconvex curvature of the upper thoracic spine. IMPRESSION: Cardiomegaly with mild pulmonary vascular congestion. Aortic atherosclerosis without aneurysm. Electronically Signed   By: Tollie Eth M.D.   On: 01/03/2017 22:17    EKG: Independently reviewed.  Appears to be a sinus rhythm with background artifact.  Assessment/Plan Acute encephalopathy: Acute.  Patient found to be acutely altered and not planning like normal.  On physical exam found to appear to be posturing with mouth smacking given concern for seizure-like activity.  Question if symptoms secondary to Seizure vs. hypoglycemia vs. Infection vs mildly elevated ammonia level (42) vs. multifactorial  - Admit to a telemetry bed - NPO - Follow-up blood cultures - Neurochecks - May want to recheck ammonia level in a.m. - Ativan given for possible seizure-like activity - check MRI of the brain - Appreciate neurology consultative services, will follow-up for further recommendations  Hypoglycemia , H/O diabetes mellitus type 2 diet controlled: Patient's initial blood glucose noted to be 62 on admission, but she is not on any oral hypoglycemic agents or insulin.  Patient was given an amp of D50 with improvement of blood sugars. - Hypoglycemic protocol - CBGs every 4  hours overnight  Lower extremity ulceration: Acute on chronic. Patient with chronic lower extremity ulcerations and wounds that appeared to possibly be a source of infection.  Patient recently just completed a course of antibiotics.   - Low air loss mattress replacement - Wound care consult - Follow-up blood culture - Placed on empiric antibiotics of Vanco, cefepime, and metronidazole IV - May warrant consult to ID  Hyperthyroidism: Thyroid levels last noted to be within normal limits on 11/18/2016. - check Free T3 - Continue Tapazole, when able  Normocytic normochromic anemia: Improved from previous hemoglobin of 8.9 at discharge up to 9.5 on admission. - Continue to monitor  Chronic kidney disease stage IV: Creatinine appears to be near baseline at 1.81.   - Continue to monitor  Dementia: Patient currently less responsive than her baseline.  History of CVA  Protein calorie malnutrition: Chronic.  Patient's initial albumin was noted to be 1.7 on admission.  Previously had had a prealbumin of 9.2 in 11/2016. - Check prealbumin  DVT prophylaxis: heparin Code Status: Full Family Communication: Plan of care with the patient and family present at bedside Disposition Plan: TBD Consults called: Neurology Admission status: observation  Clydie Braunondell A Smith MD [facility] Pager [phone]   If 7PM-7AM, please contact night-coverage [email or web address] Password [email or web address]  01/04/2017, 4:09 AM

## 2017-01-04 NOTE — ED Notes (Signed)
Patient transported to MRI 

## 2017-01-04 NOTE — Progress Notes (Signed)
Pt admitted from ED per bariatric air bed

## 2017-01-04 NOTE — ED Notes (Signed)
Pt okay to go MRI per Dr. Laurence SlateAroor.

## 2017-01-04 NOTE — ED Notes (Signed)
Admitting MD called in regards to rectal temp. Bear hugger applied. No further recommendations at this time.

## 2017-01-04 NOTE — Consult Note (Signed)
WOC Nurse wound consult note Reason for Consult: Numerous LE wounds bilaterally.  L>R. Patient is known to our department from our 4 consultative visits since September.See previous notes.  Consultation to Dr. Aldean BakerMarcus Duda on 11/18/16 resulted in recommendation for amputation on the left since patient is not a candidate for revascularization.  Patient's daughter wanted to consider all options at that time. Wound type: PAD, Pressure Pressure Injury POA: Yes Measurement: Right lateral malleolus to heel pressure injury, Stage 3: 5cm x 3.5cm x 0.2xm with 25% white necrotic slough and 75% red tissue.  Moderate amount of light yellow exudate with strong odor. Periwound is macerated and old dressing is saturated. Right 2nd digit:  1.5cm x 1cm black necrotic eschar (dry).  Periwound tissue is dry and there is no exudate. Left lateral calf full thickness wound:  25cm x 10cm with largely necrotic wound bed, moderate to large amount of foul smelling exudate and intact periwound tissue. Painful. Left medial heel with 2cm round area of black eschar. Left toes #2-5 with open partial thickness wounds, no exudate, red wound base. Wound bed:As noted above Drainage (amount, consistency, odor): As noted above  Periwound: As noted above Dressing procedure/placement/frequency: Patient is on a bariatric therapeutic mattress with low air loss feature and is wearing Prevalon pressure redistribution heel boots bilaterally.Conservative wound care orders are provided for Nursing staff. If you agree, reconsultation with Dr. Lajoyce Cornersuda is recommended for further discussion with family regarding POC.  WOC nursing team will not follow, but will remain available to this patient, the nursing and medical teams.  Please re-consult if needed. Thanks, Ladona MowLaurie Ahmia Colford, MSN, RN, GNP, Hans EdenCWOCN, CWON-AP, FAAN  Pager# [phone]

## 2017-01-04 NOTE — ED Notes (Signed)
Family requested to see admitting MD. Informed family that MD will be coming to talk to them.

## 2017-01-04 NOTE — Progress Notes (Signed)
PROGRESS NOTE    Sylvia Medina  ZOX:[id number] DOB: 04-Aug-1935 DOA: 01/03/2017 PCP: Patient, No Pcp Per   Chief Complaint  Patient presents with  . Altered Mental Status    Brief Narrative:  HPI on 01/04/2017 by Dr. Madelyn Flavors Sylvia Medina is a 81 y.o. female with medical history significant of dementia, HTN, H/O CVA, CKD stage IV, chronic normocytic anemia, chronic decubitus ulcer, PVD s/p right popliteal stenting, osteomyelitis, hyperthyroidism; who presented after being found to be acutely altered.   Patient was last noted to be at baseline on 11/19 for which family notes patient is able to appropriately interact and speak although speech is slurred.  Now patient will not interact and has not been speaking.  Family notes that the patient is prone to urinary tract infections.  They did not note any recent reports of fevers, cough, abdominal pain, nausea, or vomiting.  She has no known history of seizure disorder. Patient previously hospitalized from 10/4-10/22, for osteomyelitis treated with IV antibiotics for which the patient just recently completed.  Subsequently patient was readmitted into the hospital on 11/6-11/8 for hyponatremia related to dehydration.  Assessment & Plan   Acute metabolic encephalopathy -The patient found to be altered on admission with posturing and mouth smacking, concern for seizure-like activity -Possible etiology seizure versus hypoglycemia versus infection -Neurology consulted and appreciated -Patient given Keppra load, Ativan -CT head showed mild to moderate central atrophy with chronic small vessel ischemic disease. Chronic left external capsule infarct. No acute intracranial normality. -EEG abnormal due to moderate diffuse background slowing-indicates diffuse cerebral dysfunction that is nonspecific in etiology and can be seen with hypoxic/ischemic injury, toxic/metabolic encephalopathies, neurodegenerative disorders or medication effect. -MRI  brain: Midbrain signal abnormality can occur in acute encephalopathy. Splenium of signal abnormality can be seen as a sequelae of seizure activity and also metabolic disorders. Small vessel disease type hemorrhage in the left external capsule. -Will obtain thiamine level -Continue neuro checks  Diabetes mellitus, type II complicated by Hypoglycemia -Currently diet controlled diabetes, not on any oral hypoglycemics or insulin -Blood glucose on admission 62 -Was given D50 on admission -Continue to monitor CBGs  Possible sepsis secondary to Lower extremity ulceration, acute on chronic -Patient noted to be hypothermic, tachycardic, tachypneic on admission -Possibly source of infection -Patient recently completed course of antibiotics as an outpatient -Continue low air loss mattress -Wound care consulted -UA negative -CXR: cardiomegaly with mild pulm vasc congestion -Blood and urine cultures pending -Placed on Vancomycin, cefepime, Flagyl  Hyperthyroidism -Continue Tapazole  Normocytic normochromic anemia -Hemoglobin currently 8.5, appears to be stable and at baseline -Continue to monitor CBC  Chronic kidney disease, stage IV -Creatinine currently 1.63, appears to be stable and at baseline -Continue to monitor BMP  Dementia -Cannot truly assess  History of CVA  Chronic protein calorie malnutrition -prealbumin pending -Albumin 1.7 on admission  DVT Prophylaxis  heparin  Code Status: Full  Family Communication: None at bedside  Disposition Plan: Observation. Patient currently hypothermic, will place on bedhugger- bed request changed to stepdown  Consultants Neurology  Procedures  EEG  Antibiotics   Anti-infectives (From admission, onward)   Start     Dose/Rate Route Frequency Ordered Stop   01/05/17 0500  ceFEPIme (MAXIPIME) 2 g in dextrose 5 % 50 mL IVPB     2 g 100 mL/hr over 30 Minutes Intravenous Every 24 hours 01/04/17 0534     01/05/17 0400  vancomycin  (VANCOCIN) 1,500 mg in sodium chloride 0.9 % 500  mL IVPB     1,500 mg 250 mL/hr over 120 Minutes Intravenous Every 24 hours 01/04/17 0534     01/04/17 0600  metroNIDAZOLE (FLAGYL) IVPB 500 mg     500 mg 100 mL/hr over 60 Minutes Intravenous Every 8 hours 01/04/17 0526     01/04/17 0545  vancomycin (VANCOCIN) 2,500 mg in sodium chloride 0.9 % 500 mL IVPB     2,500 mg 250 mL/hr over 120 Minutes Intravenous  Once 01/04/17 0534     01/04/17 0545  ceFEPIme (MAXIPIME) 2 g in dextrose 5 % 50 mL IVPB     2 g 100 mL/hr over 30 Minutes Intravenous  Once 01/04/17 0534        Subjective:   Sylvia Medina seen and examined today.  Not verbal or interactive.  Objective:   Vitals:   01/04/17 0915 01/04/17 0930 01/04/17 0945 01/04/17 1047  BP: (!) 115/54 (!) 132/55 (!) 125/54   Pulse: 70 73 68   Resp: 16 19 19    Temp:    (!) 90.9 F (32.7 C)  TempSrc:    Rectal  SpO2: 98% 95% 98%     Intake/Output Summary (Last 24 hours) at 01/04/2017 1053 Last data filed at 01/04/2017 1049 Gross per 24 hour  Intake -  Output 1 ml  Net -1 ml   There were no vitals filed for this visit.  Exam  General: Well developed, well nourished, NAD, appears stated age  HEENT: NCAT, mucous membranes dry, edentulous   Cardiovascular: S1 S2 auscultated, no rubs. Regular rate and rhythm.  Respiratory: Clear to auscultation bilaterally with equal chest rise  Abdomen: Soft, obese, nontender, nondistended, + bowel sounds  Extremities: warm dry without cyanosis clubbing or edema  Neuro: cannot fully assess as patient is not interactive- opens eyes, does not follow commands  Skin: multiple ulcerations on B/L LE with mild erythema and drainage   Data Reviewed: I have personally reviewed following labs and imaging studies  CBC: Recent Labs  Lab 01/03/17 2132 01/04/17 0955  WBC 8.9 11.3*  NEUTROABS 5.1 PENDING  HGB 9.5* 8.5*  HCT 30.5* 27.6*  MCV 84.5 84.1  PLT 313 PENDING   Basic Metabolic  Panel: Recent Labs  Lab 01/03/17 2132 01/04/17 0955  NA 141 143  K 3.8 3.4*  CL 119* 120*  CO2 17* 17*  GLUCOSE 62* 102*  BUN 21* 19  CREATININE 1.81* 1.63*  CALCIUM 8.1* 8.0*   GFR: Estimated Creatinine Clearance: 37.7 mL/min (A) (by C-G formula based on SCr of 1.63 mg/dL (H)). Liver Function Tests: Recent Labs  Lab 01/03/17 2132  AST 35  ALT 15  ALKPHOS 345*  BILITOT 0.8  PROT 5.5*  ALBUMIN 1.7*   No results for input(s): LIPASE, AMYLASE in the last 168 hours. Recent Labs  Lab 01/04/17 0008  AMMONIA 42*   Coagulation Profile: No results for input(s): INR, PROTIME in the last 168 hours. Cardiac Enzymes: Recent Labs  Lab 01/03/17 2137  TROPONINI <0.03   BNP (last 3 results) No results for input(s): PROBNP in the last 8760 hours. HbA1C: No results for input(s): HGBA1C in the last 72 hours. CBG: Recent Labs  Lab 01/04/17 0009 01/04/17 0123 01/04/17 0339 01/04/17 0435 01/04/17 0802  GLUCAP 134* 113* 103* 105* 96   Lipid Profile: No results for input(s): CHOL, HDL, LDLCALC, TRIG, CHOLHDL, LDLDIRECT in the last 72 hours. Thyroid Function Tests: No results for input(s): TSH, T4TOTAL, FREET4, T3FREE, THYROIDAB in the last 72 hours. Anemia Panel: No  results for input(s): VITAMINB12, FOLATE, FERRITIN, TIBC, IRON, RETICCTPCT in the last 72 hours. Urine analysis:    Component Value Date/Time   COLORURINE YELLOW 01/03/2017 2354   APPEARANCEUR CLEAR 01/03/2017 2354   LABSPEC 1.012 01/03/2017 2354   PHURINE 5.0 01/03/2017 2354   GLUCOSEU 50 (A) 01/03/2017 2354   HGBUR NEGATIVE 01/03/2017 2354   BILIRUBINUR NEGATIVE 01/03/2017 2354   KETONESUR NEGATIVE 01/03/2017 2354   PROTEINUR NEGATIVE 01/03/2017 2354   NITRITE NEGATIVE 01/03/2017 2354   LEUKOCYTESUR NEGATIVE 01/03/2017 2354   Sepsis Labs: @LABRCNTIP (procalcitonin:4,lacticidven:4)  )No results found for this or any previous visit (from the past 240 hour(s)).    Radiology Studies: Ct Head Wo  Contrast  Result Date: 01/03/2017 CLINICAL DATA:  Decreased mental status over the past 24 hours. Altered level consciousness. EXAM: CT HEAD WITHOUT CONTRAST TECHNIQUE: Contiguous axial images were obtained from the base of the skull through the vertex without intravenous contrast. COMPARISON:  11/26/2004 FINDINGS: BRAIN: There is mild-to-moderate central atrophy. No intraparenchymal hemorrhage, mass effect nor midline shift. Mild periventricular white matter hypodensities consistent with chronic small vessel ischemic disease is noted. Chronic left external capsule infarct along its posterior aspect as before. No acute large vascular territory infarcts. No abnormal extra-axial fluid collections. Basal cisterns are not effaced and midline. VASCULAR: Moderate calcific atherosclerosis of the carotid siphons. SKULL: No skull fracture. No significant scalp soft tissue swelling. SINUSES/ORBITS: The mastoid air-cells are clear. The included paranasal sinuses are well-aerated.The included ocular globes and orbital contents are non-suspicious. OTHER: None. IMPRESSION: Mild-to-moderate central atrophy with chronic small vessel ischemic disease. Chronic left external capsule infarct. No acute intracranial abnormality. Electronically Signed   By: Tollie Ethavid  Kwon M.D.   On: 01/03/2017 23:12   Mr Brain Wo Contrast  Result Date: 01/04/2017 CLINICAL DATA:  81 year old female with altered mental status, possible seizure. Sacral decubitus ulcer. EXAM: MRI HEAD WITHOUT CONTRAST TECHNIQUE: Multiplanar, multiecho pulse sequences of the brain and surrounding structures were obtained without intravenous contrast. COMPARISON:  Head CT without contrast 01/03/2017 and earlier. Brain MRI 09/16/2004 FINDINGS: Study is moderately degraded by motion artifact despite repeated imaging attempts. 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 Suspect some additional brainstem and  cerebellar volume loss since 2006. Chronic right pontine lacunar infarct is stable. Confluent abnormal FLAIR hyperintensity in the medial midbrain and anterior periaqueductal gray matter as seen on series 10, image 10. Similar confluent abnormal signal in the splenium of the corpus callosum. There is a small chronic appearing microhemorrhage at the left splenium. These areas appear mildly facilitated on diffusion. Possible associated abnormal T2 and FLAIR hyperintensity in the mamillary bodies (series 10, image 11). No associated signal abnormality in the thalami or basal ganglia. There is a chronic appearing lacunar infarct of the anterior right corona radiata which is near the right caudate. No definite restricted diffusion to suggest acute infarct. However, there is also a widespread area of chronic appearing blood products in the posterior right temporal lobe and right temporal operculum (series 9, image 55). There is also chronic hemosiderin in the left external capsule (series 9, image 50). No definite acute intracranial hemorrhage. No definite extra-axial fluid. No ventriculomegaly. Basilar cisterns are patent. Vascular: Questionable loss of the right ICA siphon flow void (series 6, image 7). Vertebral and left ICA flow void appears stable since 2006. Skull and upper cervical spine: Not well evaluated. Sinuses/Orbits: Postoperative changes to both globes. Otherwise negative orbits soft tissues. Visualized paranasal sinuses and mastoids are  stable and well pneumatized. Other: IMPRESSION: 1. Study is limited due to moderate motion degradation despite repeated imaging attempts. 2. Confluent but nonspecific abnormal T2 and FLAIR signal in the bilateral splenium of the corpus callosum and the bilateral midbrain. - Diffusion is mildly facilitated suggesting this represents edema and not infarcts. - Midbrain signal abnormality can occur in Wernicke Encephalopathy, and there is questionable associated mamillary body  abnormality on these images, which if genuine which strongly suggest the diagnosis. Recommend correlation for Thiamine deficiency. - Splenium signal abnormality can be seen as the sequelae of seizure activity and also metabolic disorders. 3. 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 4. Confluent but nonspecific chronic appearing hemorrhage in the right temporal lobe. 5. Additional small vessel disease type hemorrhage in the left external capsule. Chronic lacune in the right anterior corona radiata. Electronically Signed   By: Odessa FlemingH  Hall M.D.   On: 01/04/2017 08:15   Dg Chest Port 1 View  Result Date: 01/03/2017 CLINICAL DATA:  Altered mental status EXAM: PORTABLE CHEST 1 VIEW COMPARISON:  06/06/2016 FINDINGS: Stable cardiomegaly with moderate aortic atherosclerosis. Mild vascular congestion and redistribution. No significant effusion. No pneumothorax. Scarring and/or atelectasis at the left costophrenic angle. No acute osseous appearing abnormality. Mild dextroconvex curvature of the upper thoracic spine. IMPRESSION: Cardiomegaly with mild pulmonary vascular congestion. Aortic atherosclerosis without aneurysm. Electronically Signed   By: Tollie Ethavid  Kwon M.D.   On: 01/03/2017 22:17     Scheduled Meds: . heparin  5,000 Units Subcutaneous Q8H  . LORazepam       Continuous Infusions: . sodium chloride 75 mL/hr at 01/04/17 0755  . ceFEPime (MAXIPIME) IV 2 g (01/04/17 0755)  . [START ON 01/05/2017] ceFEPime (MAXIPIME) IV    . levETIRAcetam    . metronidazole 500 mg (01/04/17 0753)  . [START ON 01/05/2017] vancomycin    . vancomycin 2,500 mg (01/04/17 0951)     LOS: 0 days   Time Spent in minutes   45 minutes  Ramelo Oetken D.O. on 01/04/2017 at 10:53 AM  Between 7am to 7pm - Pager - [phone]  After 7pm go to [email or web address] - password [email or web address]  And look for the night coverage person covering for me after hours  [facility] Office  [phone]

## 2017-01-05 DIAGNOSIS — R4182 Altered mental status, unspecified: Secondary | ICD-10-CM

## 2017-01-05 DIAGNOSIS — R6521 Severe sepsis with septic shock: Secondary | ICD-10-CM

## 2017-01-05 DIAGNOSIS — A419 Sepsis, unspecified organism: Principal | ICD-10-CM

## 2017-01-05 LAB — BASIC METABOLIC PANEL
ANION GAP: 6 (ref 5–15)
BUN: 18 mg/dL (ref 6–20)
CO2: 16 mmol/L — ABNORMAL LOW (ref 22–32)
CREATININE: 1.61 mg/dL — AB (ref 0.44–1.00)
Calcium: 7.6 mg/dL — ABNORMAL LOW (ref 8.9–10.3)
Chloride: 122 mmol/L — ABNORMAL HIGH (ref 101–111)
GFR calc non Af Amer: 29 mL/min — ABNORMAL LOW (ref >60)
GFR, EST AFRICAN AMERICAN: 34 mL/min — AB (ref >60)
GLUCOSE: 100 mg/dL — AB (ref 65–99)
Potassium: 3.3 mmol/L — ABNORMAL LOW (ref 3.5–5.1)
Sodium: 144 mmol/L (ref 135–145)

## 2017-01-05 LAB — CBC
HCT: 27.8 % — ABNORMAL LOW (ref 36.0–46.0)
Hemoglobin: 8.6 g/dL — ABNORMAL LOW (ref 12.0–15.0)
MCH: 25.2 pg — ABNORMAL LOW (ref 26.0–34.0)
MCHC: 30.9 g/dL (ref 30.0–36.0)
MCV: 81.5 fL (ref 78.0–100.0)
PLATELETS: 312 10*3/uL (ref 150–400)
RBC: 3.41 MIL/uL — ABNORMAL LOW (ref 3.87–5.11)
RDW: 21.8 % — AB (ref 11.5–15.5)
WBC: 11.2 10*3/uL — AB (ref 4.0–10.5)

## 2017-01-05 LAB — GLUCOSE, CAPILLARY
GLUCOSE-CAPILLARY: 92 mg/dL (ref 65–99)
GLUCOSE-CAPILLARY: 83 mg/dL (ref 65–99)
GLUCOSE-CAPILLARY: 90 mg/dL (ref 65–99)
Glucose-Capillary: 85 mg/dL (ref 65–99)
Glucose-Capillary: 101 mg/dL — ABNORMAL HIGH (ref 65–99)

## 2017-01-05 LAB — URINE CULTURE

## 2017-01-05 LAB — T4, FREE: FREE T4: 1.28 ng/dL — AB (ref 0.61–1.12)

## 2017-01-05 LAB — TSH: TSH: 10.759 u[IU]/mL — AB (ref 0.350–4.500)

## 2017-01-05 LAB — AMMONIA: AMMONIA: 35 umol/L (ref 9–35)

## 2017-01-05 MED ORDER — CHLORHEXIDINE GLUCONATE CLOTH 2 % EX PADS
6.0000 | MEDICATED_PAD | Freq: Every day | CUTANEOUS | Status: DC
  Administered 2017-01-05: 6 via TOPICAL

## 2017-01-05 MED ORDER — SODIUM CHLORIDE 0.9% FLUSH: 10.0000 mL | INTRAVENOUS | Status: DC | PRN

## 2017-01-05 MED ORDER — POTASSIUM CHLORIDE 10 MEQ/100ML IV SOLN: 10.0000 meq | INTRAVENOUS | Status: DC

## 2017-01-05 MED ORDER — POTASSIUM CHLORIDE 10 MEQ/50ML IV SOLN
INTRAVENOUS | Status: AC
  Filled 2017-01-05: qty 150

## 2017-01-05 MED ORDER — POTASSIUM CHLORIDE 10 MEQ/50ML IV SOLN
10.0000 meq | INTRAVENOUS | Status: AC
  Administered 2017-01-05 (×4): 10 meq via INTRAVENOUS

## 2017-01-05 MED ORDER — SODIUM CHLORIDE 0.9% FLUSH
10.0000 mL | Freq: Two times a day (BID) | INTRAVENOUS | Status: DC
  Administered 2017-01-05: 20 mL
  Administered 2017-01-06 – 2017-01-07 (×3): 10 mL

## 2017-01-05 MED ORDER — POTASSIUM CHLORIDE 10 MEQ/50ML IV SOLN
INTRAVENOUS | Status: AC
  Filled 2017-01-05: qty 50

## 2017-01-05 MED ORDER — SODIUM CHLORIDE 0.9 % IV BOLUS (SEPSIS)
500.0000 mL | Freq: Once | INTRAVENOUS | Status: AC
  Administered 2017-01-05: 500 mL via INTRAVENOUS

## 2017-01-05 NOTE — Progress Notes (Signed)
Peripherally Inserted Central Catheter/Midline Placement  The IV Nurse has discussed with the patient and/or persons authorized to consent for the patient, the purpose of this procedure and the potential benefits and risks involved with this procedure.  The benefits include less needle sticks, lab draws from the catheter, and the patient may be discharged home with the catheter. Risks include, but not limited to, infection, bleeding, blood clot (thrombus formation), and puncture of an artery; nerve damage and irregular heartbeat and possibility to perform a PICC exchange if needed/ordered by physician.  Alternatives to this procedure were also discussed.  Bard Power PICC patient education guide, fact sheet on infection prevention and patient information card has been provided to patient /or left at bedside.    PICC/Midline Placement Documentation  PICC Double Lumen 01/05/17 PICC Right Basilic 40 cm 1 cm (Active)  Indication for Insertion or Continuance of Line Poor Vasculature-patient has had multiple peripheral attempts or PIVs lasting less than 24 hours 01/05/2017 12:00 PM  Exposed Catheter (cm) 1 cm 01/05/2017 12:00 PM  Site Assessment Clean;Dry;Intact 01/05/2017 12:00 PM  Lumen #1 Status Flushed;Blood return noted 01/05/2017 12:00 PM  Lumen #2 Status Flushed;Blood return noted 01/05/2017 12:00 PM  Dressing Type Transparent 01/05/2017 12:00 PM  Dressing Status Clean;Dry;Intact 01/05/2017 12:00 PM  Dressing Intervention New dressing 01/05/2017 12:00 PM  Dressing Change Due 02-03-17 01/05/2017 12:00 PM   Consent signed by daughter verified by @ floor RN   Sylvia Medina, Sylvia Medina 01/05/2017, 12:55 PM

## 2017-01-05 NOTE — Progress Notes (Signed)
Called to the bedside to evaluate Sylvia Medina due to a worrisome trend in her blood pressure and overall appearance with development of mottling of the extremities.  Familiar with the patient from a recent prior admission, she is very pleasant, with history of dementia and chronic lower extremity ulcerations with infection, underlying osteomyelitis, complicated by poor circulation.  She was not a candidate for revascularization on previous evaluations and pubic surgery had  recommended treatment with amputation.  Family declined this course of therapy and she was treated with antibiotics with consultation from ID.  She was admitted again yesterday from her SNF for evaluation of acute worsening in her mental status, marked by nonverbal state and overall poor responsiveness.  She was demonstrating automatisms and posturing concerning for seizure and neurology has consulted on the case.  There is also concern for sepsis with hypothermia, tachycardia, and acute on chronic ulcerations involving the lower extremities.  She was started on broad-spectrum antibiotics for this.  Unfortunately, despite broad-spectrum antibiotics and IV fluid resuscitation, patient's mental status has continued to decline and her blood pressure has been trending lower over the past day, now maintaining MAPs <65.   Patient was admitted under full CODE STATUS at the request of her daughter, the HCPOA.  There is a recent DNR form in the EMR.  Patient's daughter discussed CODE STATUS with me at the bedside, indicating that the patient would likely not want to be on life support.  It was recommended that we respect patient's wishes rather than pursuing lines of treatment she would want.  We discussed that the patient is critically ill at this point and may not survive the night despite our best efforts.  Patient's daughter conferred with her brother by phone, and together they agreed that the patient would not want to be put on life support.   He asked that we continue antibiotics and other supportive care, but DO NOT INTUBATE, perform CPR, or use ACLS medications.

## 2017-01-05 NOTE — Progress Notes (Addendum)
PROGRESS NOTE    Sylvia Murdochvelyn B Karczewski  ZOX:[id number] DOB: [birth year] DOA: 01/03/2017 PCP: Patient, No Pcp Per   Chief Complaint  Patient presents with  . Altered Mental Status    Brief Narrative:  HPI on 01/04/2017 by Dr. Madelyn Flavorsondell Smith Sylvia Medina is a 81 y.o. female with medical history significant of dementia, HTN, H/O CVA, CKD stage IV, chronic normocytic anemia, chronic decubitus ulcer, PVD s/p right popliteal stenting, osteomyelitis, hyperthyroidism; who presented after being found to be acutely altered.   Patient was last noted to be at baseline on 11/19 for which family notes patient is able to appropriately interact and speak although speech is slurred.  Now patient will not interact and has not been speaking.  Family notes that the patient is prone to urinary tract infections.  They did not note any recent reports of fevers, cough, abdominal pain, nausea, or vomiting.  She has no known history of seizure disorder. Patient previously hospitalized from 10/4-10/22, for osteomyelitis treated with IV antibiotics for which the patient just recently completed.  Subsequently patient was readmitted into the hospital on 11/6-11/8 for hyponatremia related to dehydration.  Assessment & Plan   Acute metabolic encephalopathy -The patient found to be altered on admission with posturing and mouth smacking, concern for seizure-like activity -Possible etiology seizure versus hypoglycemia versus infection -Neurology consulted and appreciated -Patient given Keppra load, Ativan -CT head showed mild to moderate central atrophy with chronic small vessel ischemic disease. Chronic left external capsule infarct. No acute intracranial normality. -EEG abnormal due to moderate diffuse background slowing-indicates diffuse cerebral dysfunction that is nonspecific in etiology and can be seen with hypoxic/ischemic injury, toxic/metabolic encephalopathies, neurodegenerative disorders or medication effect. -MRI  brain: Midbrain signal abnormality can occur in acute encephalopathy. Splenium of signal abnormality can be seen as a sequelae of seizure activity and also metabolic disorders. Small vessel disease type hemorrhage in the left external capsule. -Thiamine level pending -Continue neuro checks -patient started on thiamine replacement- 500mg  IV TID x 3 days. Followed by 250mg  IV TID x 3 days, folowed by 100mg  PO or IV daily thereafter.  -Ammonia level was slightly elevated 42, however today 35 (with no intervention) -Continue keppra  Diabetes mellitus, type II complicated by Hypoglycemia -Currently diet controlled diabetes, not on any oral hypoglycemics or insulin -Blood glucose on admission 62 -Was given D50 on admission -Currently on D51/2NS -Continue to monitor CBGs  Possible sepsis secondary to Lower extremity ulceration, acute on chronic -Patient noted to be hypothermic, tachycardic, tachypneic on admission -Possibly source of infection -of note, back in Oct 2018, amputation was recommended as patient waas not a candidate for revascularization -Patient recently completed course of antibiotics as an outpatient -Continue low air loss mattress -Wound care consulted -UA negative -CXR: cardiomegaly with mild pulm vasc congestion -Blood cultures pending -Urine culture shows  <10K colonies, insignificant growth -Continue Vancomycin, cefepime, Flagyl -discussed possibility of amputation with daughter, however, at this time she declines as her mother declined in the past and was told her mother's wounds would not heal  Hyperthyroidism -Continue Tapazole when able to tolerate PO -pending TSH, T3, T4 -Patient follows with Dr. Romero BellingSean Ellison  Normocytic normochromic anemia -Hemoglobin currently 8.6, appears to be stable and at baseline -Continue to monitor CBC  Chronic kidney disease, stage IV -Creatinine currently 1.61, appears to be stable and at baseline -Continue to monitor  BMP  Dementia -Cannot truly assess  History of CVA -Continue home meds when able to tolerate oral medications  Chronic protein calorie malnutrition -prealbumin pending -Albumin 1.7 on admission  Hypokalemia -Continue to replace and monitor BMP -obtain magnesium level  Goals of care -discussed with patient's daughter, would like for her to remain full code and to reassess in a few days  DVT Prophylaxis  heparin  Code Status: Full  Family Communication: Daughter at bedside  Disposition Plan: Admitted. Continue to monitor in stepdown  Consultants Neurology  Procedures  EEG  Antibiotics   Anti-infectives (From admission, onward)   Start     Dose/Rate Route Frequency Ordered Stop   01/05/17 0500  ceFEPIme (MAXIPIME) 2 g in dextrose 5 % 50 mL IVPB     2 g 100 mL/hr over 30 Minutes Intravenous Every 24 hours 01/04/17 0534     01/05/17 0400  vancomycin (VANCOCIN) 1,500 mg in sodium chloride 0.9 % 500 mL IVPB     1,500 mg 250 mL/hr over 120 Minutes Intravenous Every 24 hours 01/04/17 0534     01/04/17 0600  metroNIDAZOLE (FLAGYL) IVPB 500 mg     500 mg 100 mL/hr over 60 Minutes Intravenous Every 8 hours 01/04/17 0526     01/04/17 0545  vancomycin (VANCOCIN) 2,500 mg in sodium chloride 0.9 % 500 mL IVPB     2,500 mg 250 mL/hr over 120 Minutes Intravenous  Once 01/04/17 0534 01/04/17 1327   01/04/17 0545  ceFEPIme (MAXIPIME) 2 g in dextrose 5 % 50 mL IVPB     2 g 100 mL/hr over 30 Minutes Intravenous  Once 01/04/17 0534        Subjective:   Sylvia Medina seen and examined today.  Not verbal or interactive.  Objective:   Vitals:   01/05/17 0500 01/05/17 0600 01/05/17 0700 01/05/17 0740  BP: (!) 108/45 (!) 124/56 (!) 102/58 115/61  Pulse: (!) 101  (!) 103 (!) 111  Resp: (!) 21 (!) 24 (!) 22 (!) 21  Temp:      TempSrc:      SpO2: 95%  93% 98%  Weight:      Height:        Intake/Output Summary (Last 24 hours) at 01/05/2017 1041 Last data filed at 01/05/2017  1610 Gross per 24 hour  Intake 4170 ml  Output 252 ml  Net 3918 ml   Filed Weights   01/04/17 1754  Weight: 122.9 kg (271 lb)   Exam  General: Well developed, NAD, appears stated age  HEENT: NCAT, mucous membranes slightly dry, edentulous  Cardiovascular: S1 S2 auscultated, RRR  Respiratory: Clear to auscultation bilaterally with equal chest rise  Abdomen: Soft, obese, nontender, nondistended, + bowel sounds  Extremities: warm dry without cyanosis clubbing or edema  Neuro: cannot assess  Skin: multiple ulcerations of the lower extremities  Data Reviewed: I have personally reviewed following labs and imaging studies  CBC: Recent Labs  Lab 01/03/17 2132 01/04/17 0955 01/05/17 0440  WBC 8.9 11.3* 11.2*  NEUTROABS 5.1 8.8*  --   HGB 9.5* 8.5* 8.6*  HCT 30.5* 27.6* 27.8*  MCV 84.5 84.1 81.5  PLT 313 302 312   Basic Metabolic Panel: Recent Labs  Lab 01/03/17 2132 01/04/17 0955 01/05/17 0440  NA 141 143 144  K 3.8 3.4* 3.3*  CL 119* 120* 122*  CO2 17* 17* 16*  GLUCOSE 62* 102* 100*  BUN 21* 19 18  CREATININE 1.81* 1.63* 1.61*  CALCIUM 8.1* 8.0* 7.6*   GFR: Estimated Creatinine Clearance: 37.9 mL/min (A) (by C-G formula based on SCr of 1.61 mg/dL (H)). Liver  Function Tests: Recent Labs  Lab 01/03/17 2132  AST 35  ALT 15  ALKPHOS 345*  BILITOT 0.8  PROT 5.5*  ALBUMIN 1.7*   No results for input(s): LIPASE, AMYLASE in the last 168 hours. Recent Labs  Lab 01/04/17 0008 01/05/17 0930  AMMONIA 42* 35   Coagulation Profile: No results for input(s): INR, PROTIME in the last 168 hours. Cardiac Enzymes: Recent Labs  Lab 01/03/17 2137  TROPONINI <0.03   BNP (last 3 results) No results for input(s): PROBNP in the last 8760 hours. HbA1C: No results for input(s): HGBA1C in the last 72 hours. CBG: Recent Labs  Lab 01/04/17 1919 01/04/17 1949 01/04/17 2317 01/05/17 0418 01/05/17 0742  GLUCAP 63* 99 77 85 101*   Lipid Profile: No results  for input(s): CHOL, HDL, LDLCALC, TRIG, CHOLHDL, LDLDIRECT in the last 72 hours. Thyroid Function Tests: Recent Labs    01/05/17 0930  TSH 10.759*   Anemia Panel: No results for input(s): VITAMINB12, FOLATE, FERRITIN, TIBC, IRON, RETICCTPCT in the last 72 hours. Urine analysis:    Component Value Date/Time   COLORURINE YELLOW 01/03/2017 2354   APPEARANCEUR CLEAR 01/03/2017 2354   LABSPEC 1.012 01/03/2017 2354   PHURINE 5.0 01/03/2017 2354   GLUCOSEU 50 (A) 01/03/2017 2354   HGBUR NEGATIVE 01/03/2017 2354   BILIRUBINUR NEGATIVE 01/03/2017 2354   KETONESUR NEGATIVE 01/03/2017 2354   PROTEINUR NEGATIVE 01/03/2017 2354   NITRITE NEGATIVE 01/03/2017 2354   LEUKOCYTESUR NEGATIVE 01/03/2017 2354   Sepsis Labs: @LABRCNTIP (procalcitonin:4,lacticidven:4)  ) Recent Results (from the past 240 hour(s))  Culture, blood (routine x 2)     Status: None (Preliminary result)   Collection Time: 01/03/17  9:50 PM  Result Value Ref Range Status   Specimen Description BLOOD LEFT HAND  Final   Special Requests   Final    BOTTLES DRAWN AEROBIC ONLY Blood Culture results may not be optimal due to an inadequate volume of blood received in culture bottles   Culture NO GROWTH < 24 HOURS  Final   Report Status PENDING  Incomplete  Urine culture     Status: Abnormal   Collection Time: 01/03/17 11:54 PM  Result Value Ref Range Status   Specimen Description URINE, CATHETERIZED  Final   Special Requests NONE  Final   Culture <10,000 COLONIES/mL INSIGNIFICANT GROWTH (A)  Final   Report Status 01/05/2017 FINAL  Final  MRSA PCR Screening     Status: None   Collection Time: 01/04/17  5:34 PM  Result Value Ref Range Status   MRSA by PCR NEGATIVE NEGATIVE Final    Comment:        The GeneXpert MRSA Assay (FDA approved for NASAL specimens only), is one component of a comprehensive MRSA colonization surveillance program. It is not intended to diagnose MRSA infection nor to guide or monitor treatment  for MRSA infections.       Radiology Studies: Ct Head Wo Contrast  Result Date: 01/03/2017 CLINICAL DATA:  Decreased mental status over the past 24 hours. Altered level consciousness. EXAM: CT HEAD WITHOUT CONTRAST TECHNIQUE: Contiguous axial images were obtained from the base of the skull through the vertex without intravenous contrast. COMPARISON:  11/26/2004 FINDINGS: BRAIN: There is mild-to-moderate central atrophy. No intraparenchymal hemorrhage, mass effect nor midline shift. Mild periventricular white matter hypodensities consistent with chronic small vessel ischemic disease is noted. Chronic left external capsule infarct along its posterior aspect as before. No acute large vascular territory infarcts. No abnormal extra-axial fluid collections. Basal cisterns are not  effaced and midline. VASCULAR: Moderate calcific atherosclerosis of the carotid siphons. SKULL: No skull fracture. No significant scalp soft tissue swelling. SINUSES/ORBITS: The mastoid air-cells are clear. The included paranasal sinuses are well-aerated.The included ocular globes and orbital contents are non-suspicious. OTHER: None. IMPRESSION: Mild-to-moderate central atrophy with chronic small vessel ischemic disease. Chronic left external capsule infarct. No acute intracranial abnormality. Electronically Signed   By: Tollie Ethavid  Kwon M.D.   On: 01/03/2017 23:12   Mr Brain Wo Contrast  Result Date: 01/04/2017 CLINICAL DATA:  81 year old female with altered mental status, possible seizure. Sacral decubitus ulcer. EXAM: MRI HEAD WITHOUT CONTRAST TECHNIQUE: Multiplanar, multiecho pulse sequences of the brain and surrounding structures were obtained without intravenous contrast. COMPARISON:  Head CT without contrast 01/03/2017 and earlier. Brain MRI 09/16/2004 FINDINGS: Study is moderately degraded by motion artifact despite repeated imaging attempts. 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 Suspect some additional brainstem and cerebellar volume loss since 2006. Chronic right pontine lacunar infarct is stable. Confluent abnormal FLAIR hyperintensity in the medial midbrain and anterior periaqueductal gray matter as seen on series 10, image 10. Similar confluent abnormal signal in the splenium of the corpus callosum. There is a small chronic appearing microhemorrhage at the left splenium. These areas appear mildly facilitated on diffusion. Possible associated abnormal T2 and FLAIR hyperintensity in the mamillary bodies (series 10, image 11). No associated signal abnormality in the thalami or basal ganglia. There is a chronic appearing lacunar infarct of the anterior right corona radiata which is near the right caudate. No definite restricted diffusion to suggest acute infarct. However, there is also a widespread area of chronic appearing blood products in the posterior right temporal lobe and right temporal operculum (series 9, image 55). There is also chronic hemosiderin in the left external capsule (series 9, image 50). No definite acute intracranial hemorrhage. No definite extra-axial fluid. No ventriculomegaly. Basilar cisterns are patent. Vascular: Questionable loss of the right ICA siphon flow void (series 6, image 7). Vertebral and left ICA flow void appears stable since 2006. Skull and upper cervical spine: Not well evaluated. Sinuses/Orbits: Postoperative changes to both globes. Otherwise negative orbits soft tissues. Visualized paranasal sinuses and mastoids are stable and well pneumatized. Other: IMPRESSION: 1. Study is limited due to moderate motion degradation despite repeated imaging attempts. 2. Confluent but nonspecific abnormal T2 and FLAIR signal in the bilateral splenium of the corpus callosum and the bilateral midbrain. - Diffusion is mildly facilitated suggesting this represents edema and not infarcts. - Midbrain signal abnormality can occur in Wernicke  Encephalopathy, and there is questionable associated mamillary body abnormality on these images, which if genuine which strongly suggest the diagnosis. Recommend correlation for Thiamine deficiency. - Splenium signal abnormality can be seen as the sequelae of seizure activity and also metabolic disorders. 3. 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 4. Confluent but nonspecific chronic appearing hemorrhage in the right temporal lobe. 5. Additional small vessel disease type hemorrhage in the left external capsule. Chronic lacune in the right anterior corona radiata. Electronically Signed   By: Odessa FlemingH  Hall M.D.   On: 01/04/2017 08:15   Dg Chest Port 1 View  Result Date: 01/03/2017 CLINICAL DATA:  Altered mental status EXAM: PORTABLE CHEST 1 VIEW COMPARISON:  06/06/2016 FINDINGS: Stable cardiomegaly with moderate aortic atherosclerosis. Mild vascular congestion and redistribution. No significant effusion. No pneumothorax. Scarring and/or atelectasis at the left costophrenic angle. No acute osseous appearing  abnormality. Mild dextroconvex curvature of the upper thoracic spine. IMPRESSION: Cardiomegaly with mild pulmonary vascular congestion. Aortic atherosclerosis without aneurysm. Electronically Signed   By: Tollie Eth M.D.   On: 01/03/2017 22:17     Scheduled Meds: . heparin  5,000 Units Subcutaneous Q8H  . mouth rinse  15 mL Mouth Rinse BID   Continuous Infusions: . ceFEPime (MAXIPIME) IV 2 g (01/04/17 0755)  . ceFEPime (MAXIPIME) IV Stopped (01/05/17 4098)  . dextrose 5 % and 0.9% NaCl 75 mL/hr at 01/04/17 2322  . levETIRAcetam Stopped (01/05/17 0408)  . metronidazole Stopped (01/05/17 0720)  . thiamine injection    . vancomycin Stopped (01/05/17 0548)     LOS: 1 day   Time Spent in minutes   45 minutes  Dalylah Ramey D.O. on 01/05/2017 at 10:41 AM  Between 7am to 7pm - Pager - [phone]  After 7pm go to [email or web address] -  password [email or web address]  And look for the night coverage person covering for me after hours  [facility] Office  [phone]

## 2017-01-05 NOTE — Plan of Care (Signed)
  Progressing Education: Knowledge of General Education information will improve 01/05/2017 0422 - Progressing by Noe GensViverito, Soraida Vickers A, RN Note Discussed plan of care with family and oriented them to unit and hospital. Answered questions regarding plan.  Clinical Measurements: Ability to maintain clinical measurements within normal limits will improve 01/05/2017 0422 - Progressing by Amberleigh Gerken A, RN Note Pt body temperature regulating to 98 axillary. Prior to this at beginning of shift, patient's body temp was 96.4. Will keep bair huggar on to maintain.  Patient remains not interactive. Unable to communicate.  Will remain free from infection 01/05/2017 0422 - Progressing by Noe GensViverito, Syleena Mchan A, RN Note Patient receiving 3 types of antibiotics.    Not Progressing Nutrition: Adequate nutrition will be maintained 01/05/2017 0422 - Not Progressing by Noe GensViverito, Ghislaine Harcum A, RN Note Pt is NPO d/t AMS - discussed with daughter that patient nutrition would be addressed but that we are not focused on that yet. She was agreeable and voiced understanding.  Skin Integrity: Risk for impaired skin integrity will decrease 01/05/2017 0422 - Not Progressing by Kawehi Hostetter A, RN Note Pt has many wounds on her legs. WOC nurse was consulted during day shift and orders were given for management.

## 2017-01-06 LAB — BASIC METABOLIC PANEL
ANION GAP: 3 — AB (ref 5–15)
BUN: 16 mg/dL (ref 6–20)
CHLORIDE: 126 mmol/L — AB (ref 101–111)
CO2: 18 mmol/L — ABNORMAL LOW (ref 22–32)
Calcium: 7.7 mg/dL — ABNORMAL LOW (ref 8.9–10.3)
Creatinine, Ser: 1.7 mg/dL — ABNORMAL HIGH (ref 0.44–1.00)
GFR calc Af Amer: 32 mL/min — ABNORMAL LOW (ref >60)
GFR calc non Af Amer: 27 mL/min — ABNORMAL LOW (ref >60)
GLUCOSE: 98 mg/dL (ref 65–99)
POTASSIUM: 3.4 mmol/L — AB (ref 3.5–5.1)
Sodium: 147 mmol/L — ABNORMAL HIGH (ref 135–145)

## 2017-01-06 LAB — CBC
HCT: 24.9 % — ABNORMAL LOW (ref 36.0–46.0)
HEMOGLOBIN: 7.8 g/dL — AB (ref 12.0–15.0)
MCH: 25.2 pg — AB (ref 26.0–34.0)
MCHC: 31.3 g/dL (ref 30.0–36.0)
MCV: 80.6 fL (ref 78.0–100.0)
Platelets: 293 10*3/uL (ref 150–400)
RBC: 3.09 MIL/uL — AB (ref 3.87–5.11)
RDW: 21.8 % — ABNORMAL HIGH (ref 11.5–15.5)
WBC: 10 10*3/uL (ref 4.0–10.5)

## 2017-01-06 LAB — GLUCOSE, CAPILLARY
GLUCOSE-CAPILLARY: 101 mg/dL — AB (ref 65–99)
GLUCOSE-CAPILLARY: 113 mg/dL — AB (ref 65–99)
GLUCOSE-CAPILLARY: 94 mg/dL (ref 65–99)
Glucose-Capillary: 89 mg/dL (ref 65–99)
Glucose-Capillary: 87 mg/dL (ref 65–99)
Glucose-Capillary: 92 mg/dL (ref 65–99)
Glucose-Capillary: 92 mg/dL (ref 65–99)

## 2017-01-06 LAB — T3, FREE
T3 FREE: 2.4 pg/mL (ref 2.0–4.4)
T3, Free: 2.5 pg/mL (ref 2.0–4.4)

## 2017-01-06 MED ORDER — DEXTROSE-NACL 5-0.45 % IV SOLN: INTRAVENOUS | Status: DC

## 2017-01-06 MED ORDER — THIAMINE HCL 100 MG/ML IJ SOLN: 100.0000 mg | Freq: Every day | INTRAMUSCULAR | Status: DC

## 2017-01-06 MED ORDER — DEXTROSE-NACL 5-0.2 % IV SOLN
INTRAVENOUS | Status: DC
  Administered 2017-01-06 (×2): via INTRAVENOUS

## 2017-01-06 MED ORDER — THIAMINE HCL 100 MG/ML IJ SOLN: 250.0000 mg | Freq: Three times a day (TID) | INTRAMUSCULAR | Status: DC

## 2017-01-06 MED ORDER — DEXTROSE-NACL 5-0.2 % IV SOLN
INTRAVENOUS | Status: DC
  Administered 2017-01-06: 08:00:00 via INTRAVENOUS

## 2017-01-06 NOTE — Consult Note (Signed)
Consultation Note Date: 01/06/2017   Patient Name: Sylvia Medina  DOB: Apr 20, 1935  MRN: [id number]  Age / Sex: 81 y.o., female  PCP: Patient, No Pcp Per Referring Physician: Briant CedarEzenduka, Nkeiruka J, MD  Reason for Consultation: Establishing goals of care, Non pain symptom management, Pain control and Psychosocial/spiritual support  HPI/Patient Profile: 81 y.o. female  with past medical history of peripheral vascular disease status post stent femoral as well as right popliteal, diabetes type 2, chronic kidney disease stage IV, dementia, remote CVA, chronic lower extremity ulcerations with underlying osteomyelitis admitted on 01/03/2017 with altered mental status.  Patient resides in a skilled nursing facility.  She has been seen by palliative medicine in October 2018 for goals of care discussion related to severe peripheral vascular disease.  Per patient's daughter, patient had apparently been doing relatively well, she was talking and laughing when last seen well at the nursing home 11/15 2018.  Per chart review, previous palliative medicine provider note, the family had to declined amputation in the setting of severe peripheral vascular disease (ABI performed October 2018 showed severe arterial disease on the left and moderate on the right.  She is at baseline nonambulatory).  Plan at that time was to continue for 6-8 weeks of antibiotics (which per daughter she did complete).  While in the emergency room she began to posture and was questioning whether she was having underlying seizures well.  Per chart review also Warnicke's encephalopathy is also a concern.  Was also hypoglycemic.  Per chart review,   attending last night called to the bedside because of patient decline. Daughter as well as patient's son are aware that she could be  trending towards end-of-life.  Consult for further goals of care  Clinical  Assessment and Goals of Care: Most information at this point is gleaned from chart review as well as telephone conversation with daughter, Sylvia Medina.  Ms. Vira BlancoGoolsby shares with me that this feels very sudden to both she and her brother, because as noted, patient had begun to look as though she was turning the corner, talking and laughing, and feeling better at the nursing home.  Patient is unable to speak for herself at this point secondary to dementia as well as clinical condition.  Her healthcare power of attorney is Sylvia Medina.  Patient also has a son.  They are making decisions as a family.    SUMMARY OF RECOMMENDATIONS   Attending confirmed DNR/DNI with daughter on 01/06/2017 Continue to attempt to treat the treatable such as continued antibiotics and antifungal IV fluids Palliative medicine meeting scheduled for 01/07/2017 at 11 AM for further goals of care discussion Per providers note in October 2018 from the palliative medicine team, the family was hopeful for 6-8 weeks of IV antibiotics would help prolong patient's life but family had stated at that time that if it was not successful they would be considering hospice.  Amputation has been refused in the past and would be unlikely to improve patient's function secondary to underlying dementia  Code  Status/Advance Care Planning:  DNR    Symptom Management:   Pain: Currently, no nonverbal signs and symptoms of pain such as grimacing, moaning; patient's creatinine is 1.7, chronic kidney disease stage IV; would recommend low-dose Dilaudid 0.25 mg or fentanyl 25-50 mcg as needed for pain or shortness of breath  Palliative Prophylaxis:   Aspiration, Bowel Regimen, Delirium Protocol, Eye Care, Frequent Pain Assessment, Oral Care and Turn Reposition  Additional Recommendations (Limitations, Scope, Preferences):  Continue to treat the treatable with abx, antifungal, anti-convulsants. In the past, amputation has been refused. Pt now  appears to be transitioning.  Psycho-social/Spiritual:   Desire for further Chaplaincy support:no  Additional Recommendations: Grief/Bereavement Support  Prognosis:   At this point patient appears critically ill, may be transitioning towards end of life.  If things were to continue to decline as rapidly as they have been, I would not be surprised if she had a prognosis of days to weeks secondary to sepsis, severe peripheral vascular disease, osteomyelitis; new hypoglycemia, seizure disorder  Discharge Planning: To Be Determined      Primary Diagnoses: Present on Admission: . Acute encephalopathy . CKD (chronic kidney disease) stage 4, GFR 15-29 ml/min (HCC) . Dementia without behavioral disturbance . Anemia of chronic disease . Protein-calorie malnutrition (HCC) . Hyperthyroidism . Lower extremity ulceration (HCC) . Hypoglycemia   I have reviewed the medical record, interviewed the patient and family, and examined the patient. The following aspects are pertinent.  Past Medical History:  Diagnosis Date  . Anemia 07/04/2012  . Cardiac arrhythmia 07/04/2012  . CKD (chronic kidney disease) stage 4, GFR 15-29 ml/min (HCC)   . CVA (cerebral vascular accident) (HCC) 07/04/2012  . Dementia 07/04/2012  . Diabetes mellitus type 2, diet-controlled (HCC) 07/04/2012  . Encephalopathy 11/17/2016  . Hyperthyroidism   . Unspecified constipation 07/04/2012  . Unspecified vitamin D deficiency 07/04/2012   Social History   Socioeconomic History  . Marital status: Widowed    Spouse name: None  . Number of children: None  . Years of education: None  . Highest education level: None  Social Needs  . Financial resource strain: Patient refused  . Food insecurity - worry: Patient refused  . Food insecurity - inability: Patient refused  . Transportation needs - medical: Patient refused  . Transportation needs - non-medical: Patient refused  Occupational History  . Occupation: retired    Tobacco Use  . Smoking status: Former Smoker    Packs/day: 0.50    Years: 40.00    Pack years: 20.00  . Smokeless tobacco: Never Used  Substance and Sexual Activity  . Alcohol use: No  . Drug use: No  . Sexual activity: Not Currently  Other Topics Concern  . None  Social History Narrative  . None   Family History  Problem Relation Age of Onset  . Thyroid disease Neg Hx    Scheduled Meds: . Chlorhexidine Gluconate Cloth  6 each Topical Daily  . heparin  5,000 Units Subcutaneous Q8H  . mouth rinse  15 mL Mouth Rinse BID  . sodium chloride flush  10-40 mL Intracatheter Q12H  . [START ON 01/07/2017] thiamine injection  250 mg Intravenous Q8H   Followed by  . [START ON 01/11/2017] thiamine injection  100 mg Intravenous Daily   Continuous Infusions: . ceFEPime (MAXIPIME) IV 2 g (01/04/17 0755)  . ceFEPime (MAXIPIME) IV Stopped (01/06/17 16100607)  . dextrose 5 % and 0.2 % NaCl 75 mL/hr at 01/06/17 0815  . levETIRAcetam Stopped (01/06/17 0356)  .  metronidazole Stopped (01/06/17 0708)  . thiamine injection 0 mg (01/05/17 2308)  . vancomycin 1,500 mg (01/06/17 0341)   PRN Meds:.acetaminophen **OR** acetaminophen, ipratropium-albuterol, ondansetron **OR** ondansetron (ZOFRAN) IV, sodium chloride flush Medications Prior to Admission:  Prior to Admission medications   Medication Sig Start Date End Date Taking? Authorizing Provider  acetaminophen (TYLENOL) 325 MG tablet Take 650 mg by mouth every 6 (six) hours as needed.   Yes [provider]  Amino Acids-Protein Hydrolys (FEEDING SUPPLEMENT, PRO-STAT SUGAR FREE 64,) LIQD Take 30 mLs by mouth 2 (two) times daily as needed.    Yes [provider]  aspirin EC 81 MG tablet Take 1 tablet (81 mg total) by mouth daily. 10/28/16  Yes Rhyne, Samantha J, PA-C  atorvastatin (LIPITOR) 10 MG tablet Take 1 tablet (10 mg total) by mouth daily. 10/28/16  Yes Rhyne, Samantha J, PA-C  cefTRIAXone 2 g in dextrose 5 % 50 mL Inject 2 g into  the vein once.   Yes [provider]  Cholecalciferol 1000 units tablet Take 1 tablet (1,000 Units total) by mouth daily. 08/31/15  Yes Ngetich, Dinah C, NP  clopidogrel (PLAVIX) 75 MG tablet Take 1 tablet (75 mg total) by mouth daily with breakfast. 10/29/16  Yes Rhyne, Samantha J, PA-C  collagenase (SANTYL) ointment Apply 1 application topically daily.   Yes [provider]  ferrous sulfate 325 (65 FE) MG tablet Take 325 mg by mouth daily with breakfast.    Yes [provider]  methimazole (TAPAZOLE) 10 MG tablet Take 10 mg by mouth 3 (three) times daily.   Yes [provider]  metoprolol tartrate (LOPRESSOR) 25 MG tablet Take 0.5 tablets (12.5 mg total) by mouth 2 (two) times daily. 12/05/16  Yes Randel Pigg, Dorma Russell, MD  metroNIDAZOLE (FLAGYL) 500 MG tablet Take 500 mg by mouth 3 (three) times daily.   Yes [provider]  Multiple Vitamin (MULTIVITAMIN) tablet Take 1 tablet by mouth daily.   Yes [provider]  oxyCODONE-acetaminophen (ROXICET) 5-325 MG tablet Take 1 tablet by mouth every 4 (four) hours as needed for severe pain (Prior to dressing changes 30 minutes before). 12/05/16  Yes Randel Pigg, Dorma Russell, MD  polyethylene glycol powder [facility]) powder Take 17 g by mouth daily. Mix in 4-8 ounces of Orange Juice and take by mouth daily for constipation. 07/04/12  Yes Lucrezia Starch, NP  Rivastigmine (EXELON) 13.3 MG/24HR PT24 Place 1 patch (13.3 mg total) onto the skin daily. 07/04/12  Yes Lucrezia Starch, NP   No Known Allergies Review of Systems  Unable to perform ROS: Patient nonverbal    Physical Exam  Constitutional: She appears well-developed and well-nourished.  Acutely ill-appearing older female: Unresponsive to voice and light touch.  Has a bear hugger  HENT:  Head: Normocephalic and atraumatic.  Cardiovascular:  Tachycardic  Pulmonary/Chest:  Respirations are shallow, irregular  Neurological:  Unresponsive to voice  or light touch  Skin: Skin is warm and dry.  Psychiatric:  Unable to test  Nursing note and vitals reviewed.   Vital Signs: BP (!) 92/49   Pulse (!) 110   Temp (!) 100.9 F (38.3 C) (Oral)   Resp (!) 23   Ht 5\' 6"  (1.676 m)   Wt 122.9 kg (271 lb)   SpO2 96%   BMI 43.74 kg/m  Pain Assessment: CPOT       SpO2: SpO2: 96 % O2 Device:SpO2: 96 % O2 Flow Rate: .   IO: Intake/output summary:   Intake/Output Summary (  Last 24 hours) at 01/06/2017 1101 Last data filed at 01/06/2017 0830 Gross per 24 hour  Intake 2110 ml  Output 575 ml  Net 1535 ml    LBM: Last BM Date: 12/21/16 Baseline Weight: Weight: 122.9 kg (271 lb) Most recent weight: Weight: 122.9 kg (271 lb)     Palliative Assessment/Data:   Flowsheet Rows     Most Recent Value  Intake Tab  Referral Department  Hospitalist  Unit at Time of Referral  Intermediate Care Unit  Palliative Care Primary Diagnosis  Sepsis/Infectious Disease  Date Notified  01/06/17  Palliative Care Type  Return patient Palliative Care  Reason for referral  Clarify Goals of Care  Date of Admission  01/03/17  Date first seen by Palliative Care  01/06/17  # of days Palliative referral response time  0 Day(s)  # of days IP prior to Palliative referral  3  Clinical Assessment  Palliative Performance Scale Score  30%  Pain Max last 24 hours  Not able to report  Pain Min Last 24 hours  Not able to report  Dyspnea Max Last 24 Hours  Not able to report  Dyspnea Min Last 24 hours  Not able to report  Nausea Max Last 24 Hours  Not able to report  Nausea Min Last 24 Hours  Not able to report  Anxiety Max Last 24 Hours  Not able to report  Anxiety Min Last 24 Hours  Not able to report  Other Max Last 24 Hours  Not able to report  Psychosocial & Spiritual Assessment  Palliative Care Outcomes  Patient/Family meeting held?  Yes  Who was at the meeting?  scheduled 11/24 11am  Palliative Care follow-up planned  Yes, Facility      Time In:  1015 Time Out: 1114 Time Total: 59 min Greater than 50%  of this time was spent counseling and coordinating care related to the above assessment and plan.  Signed by: Irean Hong, NP   Please contact Palliative Medicine Team phone at [phone] for questions and concerns.  For individual provider: See Loretha Stapler

## 2017-01-06 NOTE — Progress Notes (Signed)
PROGRESS NOTE  KARON COTTERILL IHK:[id number] DOB: 1935-05-03 DOA: 01/03/2017 PCP: Patient, No Pcp Per  HPI/Recap of past 24 hours: HPI on 01/04/2017 by Dr. Madelyn Flavors Arnette Felts a 81 y.o.femalewith medical history significant of dementia, HTN, H/O CVA, CKD stage IV, chronic normocytic anemia, chronic decubitus ulcer, PVD s/p right popliteal stenting, osteomyelitis, hyperthyroidism; who presented after being found to be acutely altered. Patient was last noted to be at baseline on 11/19 for which family notes patient is able to appropriately interact and speak although speech is slurred. Now patient will not interact and has not been speaking. Family notes that the patient is prone to urinary tract infections. They did not note any recent reportsof fevers, cough, abdominal pain, nausea, or vomiting. She has no known history of seizure disorder.Patient previously hospitalized from 10/4-10/22,for osteomyelitis treated with IV antibiotics for which the patientjust recently completed. Subsequently patient was readmitted into the hospital on 11/6-11/8 for hyponatremia related to dehydration.  Today, pt noted to be still non-verbal, unable to communicate, not easily arousable. Looked comfortable, didn't look like she was in distress. Unable to do ROS.  Assessment/Plan: Principal Problem:   Acute encephalopathy Active Problems:   Diabetes mellitus type 2, diet-controlled (HCC)   Dementia without behavioral disturbance   Anemia of chronic disease   Protein-calorie malnutrition (HCC)   CKD (chronic kidney disease) stage 4, GFR 15-29 ml/min (HCC)   Hyperthyroidism   Lower extremity ulceration (HCC)   Hypoglycemia  Acute metabolic encephalopathy -Not improved -The patient found to be altered on admission with posturing and mouth smacking, concern for seizure-like activity -Possible etiology Wernicke encephalopathy Vs seizure Vs hypoglycemia Vs infection -Neurology  consulted and appreciated -Patient given Keppra load, Ativan -CT head showed mild to moderate central atrophy with chronic small vessel ischemic disease. Chronic left external capsule infarct. No acute intracranial normality. -EEG abnormal due to moderate diffuse background slowing-indicates diffuse cerebral dysfunction that is nonspecific in etiology and can be seen with hypoxic/ischemic injury, toxic/metabolic encephalopathies, neurodegenerative disorders or medication effect. -MRI brain: Midbrain signal abnormality can occur in acute encephalopathy. Splenium of signal abnormality can be seen as a sequelae of seizure activity and also metabolic disorders. Small vessel disease type hemorrhage in the left external capsule. -Thiamine level pending -Continue neuro checks -patient started on thiamine replacement- 500mg  IV TID x 3 days. Followed by 250mg  IV TID x 3 days, folowed by 100mg  PO or IV daily thereafter.  -Ammonia level was slightly elevated 42, currently normalized (with no intervention) -Continue keppra  Diabetes mellitus, type II complicated by Hypoglycemia -Currently diet controlled diabetes, not on any oral hypoglycemics or insulin -Blood glucose on admission 62 -Was given D50 on admission -Currently on D51/4NS, changed from NS -Continue to monitor CBGs  Possible sepsis secondary to Lower extremity ulceration, acute on chronic Not improved -Patient noted to be hypothermic, tachycardic, tachypneic on admission -Possibly source of infection -of note, back in Oct 2018, amputation was recommended as patient waas not a candidate for revascularization -Patient recently completed course of antibiotics as an outpatient -Continue low air loss mattress -Wound care consulted -UA negative -CXR: cardiomegaly with mild pulm vasc congestion -Blood cultures, no growth till date -Urine culture shows  <10K colonies, insignificant growth -Continue Vancomycin, cefepime, Flagyl -discussed  possibility of amputation with daughter, however, at this time she declines as her mother declined in the past.  Hyperthyroidism -Continue Tapazole when able to tolerate PO -TSH 10.759, T4 1.28, TSH elevated likely due to acute severe illness,  continue to monitor  -Patient follows with Dr. Romero BellingSean Ellison  Normocytic normochromic anemia -Hemoglobin currently 8.6-->7.8, no signs of bleeding -Continue to monitor CBC  Chronic kidney disease, stage IV -Creatinine currently 1.61,->1.7  rising -Continue to monitor BMP  Dementia -Unable to assess  History of CVA -Holding home meds for now  Chronic protein calorie malnutrition -prealbumin 10.5, low -Albumin 1.7 on admission  Hypokalemia -Continue to replace and monitor BMP -Will order magnesium level   Goals of care -discussed with patient's daughter, currently DNR. Palliative consulted, pt daughter wants to still give IVF/IV meds. Another meeting with palliative team scheduled for 01/07/17   Code Status: DNR  Family Communication: None at bedside  Disposition Plan: Pending palliative team and prognosis of patient   Consultants:  Neurology  Procedures:  EEG  Antimicrobials:  IV cefepime  IV Vancomycin  IV Flagyl  DVT prophylaxis:  Heparin   Objective: Vitals:   01/06/17 0700 01/06/17 0800 01/06/17 0828 01/06/17 1100  BP: (!) 92/49     Pulse: (!) 110     Resp: (!) 23     Temp:  (!) 101 F (38.3 C) (!) 100.9 F (38.3 C) 98.4 F (36.9 C)  TempSrc:  Axillary Oral Oral  SpO2: 96%     Weight:      Height:        Intake/Output Summary (Last 24 hours) at 01/06/2017 1230 Last data filed at 01/06/2017 1155 Gross per 24 hour  Intake 2110 ml  Output 775 ml  Net 1335 ml   Filed Weights   01/04/17 1754  Weight: 122.9 kg (271 lb)    Exam:   General:  NAD, sleepy, not easily arousable, not alert/oriented  Cardiovascular: S1, s2 present, no added hrt sound  Respiratory: Chest clear  bilaterally  Abdomen: Soft, obese, non-tender, non-distended, +BS  Musculoskeletal: No bilateral pedal edema, obese extremities  Skin: Multiple ulcerations of lower extremities  Psychiatry: Unable to assess    Data Reviewed: CBC: Recent Labs  Lab 01/03/17 2132 01/04/17 0955 01/05/17 0440 01/06/17 0511  WBC 8.9 11.3* 11.2* 10.0  NEUTROABS 5.1 8.8*  --   --   HGB 9.5* 8.5* 8.6* 7.8*  HCT 30.5* 27.6* 27.8* 24.9*  MCV 84.5 84.1 81.5 80.6  PLT 313 302 312 293   Basic Metabolic Panel: Recent Labs  Lab 01/03/17 2132 01/04/17 0955 01/05/17 0440 01/06/17 0511  NA 141 143 144 147*  K 3.8 3.4* 3.3* 3.4*  CL 119* 120* 122* 126*  CO2 17* 17* 16* 18*  GLUCOSE 62* 102* 100* 98  BUN 21* 19 18 16   CREATININE 1.81* 1.63* 1.61* 1.70*  CALCIUM 8.1* 8.0* 7.6* 7.7*   GFR: Estimated Creatinine Clearance: 35.9 mL/min (A) (by C-G formula based on SCr of 1.7 mg/dL (H)). Liver Function Tests: Recent Labs  Lab 01/03/17 2132  AST 35  ALT 15  ALKPHOS 345*  BILITOT 0.8  PROT 5.5*  ALBUMIN 1.7*   No results for input(s): LIPASE, AMYLASE in the last 168 hours. Recent Labs  Lab 01/04/17 0008 01/05/17 0930  AMMONIA 42* 35   Coagulation Profile: No results for input(s): INR, PROTIME in the last 168 hours. Cardiac Enzymes: Recent Labs  Lab 01/03/17 2137  TROPONINI <0.03   BNP (last 3 results) No results for input(s): PROBNP in the last 8760 hours. HbA1C: No results for input(s): HGBA1C in the last 72 hours. CBG: Recent Labs  Lab 01/05/17 1948 01/06/17 0058 01/06/17 0329 01/06/17 0758 01/06/17 1153  GLUCAP 90 113* 94  89 87   Lipid Profile: No results for input(s): CHOL, HDL, LDLCALC, TRIG, CHOLHDL, LDLDIRECT in the last 72 hours. Thyroid Function Tests: Recent Labs    01/05/17 0930 01/05/17 2051  TSH 10.759*  --   FREET4  --  1.28*  T3FREE 2.5  --    Anemia Panel: No results for input(s): VITAMINB12, FOLATE, FERRITIN, TIBC, IRON, RETICCTPCT in the last 72  hours. Urine analysis:    Component Value Date/Time   COLORURINE YELLOW 01/03/2017 2354   APPEARANCEUR CLEAR 01/03/2017 2354   LABSPEC 1.012 01/03/2017 2354   PHURINE 5.0 01/03/2017 2354   GLUCOSEU 50 (A) 01/03/2017 2354   HGBUR NEGATIVE 01/03/2017 2354   BILIRUBINUR NEGATIVE 01/03/2017 2354   KETONESUR NEGATIVE 01/03/2017 2354   PROTEINUR NEGATIVE 01/03/2017 2354   NITRITE NEGATIVE 01/03/2017 2354   LEUKOCYTESUR NEGATIVE 01/03/2017 2354   Sepsis Labs: @LABRCNTIP (procalcitonin:4,lacticidven:4)  ) Recent Results (from the past 240 hour(s))  Culture, blood (routine x 2)     Status: None (Preliminary result)   Collection Time: 01/03/17  9:50 PM  Result Value Ref Range Status   Specimen Description BLOOD LEFT HAND  Final   Special Requests   Final    BOTTLES DRAWN AEROBIC ONLY Blood Culture results may not be optimal due to an inadequate volume of blood received in culture bottles   Culture NO GROWTH 2 DAYS  Final   Report Status PENDING  Incomplete  Culture, blood (routine x 2)     Status: None (Preliminary result)   Collection Time: 01/03/17 10:29 PM  Result Value Ref Range Status   Specimen Description BLOOD RIGHT HAND  Final   Special Requests IN PEDIATRIC BOTTLE Blood Culture adequate volume  Final   Culture NO GROWTH 1 DAY  Final   Report Status PENDING  Incomplete  Urine culture     Status: Abnormal   Collection Time: 01/03/17 11:54 PM  Result Value Ref Range Status   Specimen Description URINE, CATHETERIZED  Final   Special Requests NONE  Final   Culture <10,000 COLONIES/mL INSIGNIFICANT GROWTH (A)  Final   Report Status 01/05/2017 FINAL  Final  MRSA PCR Screening     Status: None   Collection Time: 01/04/17  5:34 PM  Result Value Ref Range Status   MRSA by PCR NEGATIVE NEGATIVE Final    Comment:        The GeneXpert MRSA Assay (FDA approved for NASAL specimens only), is one component of a comprehensive MRSA colonization surveillance program. It is  not intended to diagnose MRSA infection nor to guide or monitor treatment for MRSA infections.       Studies: No results found.  Scheduled Meds: . Chlorhexidine Gluconate Cloth  6 each Topical Daily  . heparin  5,000 Units Subcutaneous Q8H  . mouth rinse  15 mL Mouth Rinse BID  . sodium chloride flush  10-40 mL Intracatheter Q12H  . [START ON 01/07/2017] thiamine injection  250 mg Intravenous Q8H   Followed by  . [START ON 01/11/2017] thiamine injection  100 mg Intravenous Daily    Continuous Infusions: . ceFEPime (MAXIPIME) IV 2 g (01/04/17 0755)  . ceFEPime (MAXIPIME) IV Stopped (01/06/17 [id number])  . dextrose 5 % and 0.2 % NaCl 75 mL/hr at 01/06/17 0815  . levETIRAcetam Stopped (01/06/17 0356)  . metronidazole Stopped (01/06/17 0708)  . thiamine injection Stopped (01/06/17 1130)  . vancomycin 1,500 mg (01/06/17 0341)     LOS: 2 days     Briant CedarNkeiruka J Ezenduka, MD  [facility]   If 7PM-7AM, please contact night-coverage [email or web address] Password [email or web address] 01/06/2017, 12:30 PM

## 2017-01-06 NOTE — Care Management Note (Signed)
Case Management Note  Patient Details  Name: Sylvia Medina MRN: [id number] Date of Birth: 1935-03-27  Subjective/Objective:      Acute metabolic encephalopathy, DM, sepsis s/t lower extremity ulceration           Action/Plan: Discharge Planning: Please see previous NCM notes. Pt from [facility]. Recently code status changed to DNR. Scheduled GOC meeting on 01/07/2017 to discuss with family medical treatment. Will continue to follow for dc needs. CSW following for SNF placement.     Expected Discharge Date:                 Expected Discharge Plan:  Hospice Medical Facility([facility])  In-House Referral:  Clinical Social Work  Discharge planning Services  CM Consult  Post Acute Care Choice:  NA Choice offered to:  NA  DME Arranged:  N/A DME Agency:  NA  HH Arranged:  NA HH Agency:  NA  Status of Service:  In process, will continue to follow  If discussed at Long Length of Stay Meetings, dates discussed:    Additional Comments:  Elliot CousinShavis, Matt Delpizzo Ellen, RN 01/06/2017, 11:37 AM

## 2017-01-07 DIAGNOSIS — N289 Disorder of kidney and ureter, unspecified: Secondary | ICD-10-CM

## 2017-01-07 DIAGNOSIS — Z515 Encounter for palliative care: Secondary | ICD-10-CM

## 2017-01-07 DIAGNOSIS — E162 Hypoglycemia, unspecified: Secondary | ICD-10-CM

## 2017-01-07 DIAGNOSIS — D638 Anemia in other chronic diseases classified elsewhere: Secondary | ICD-10-CM

## 2017-01-07 DIAGNOSIS — E0590 Thyrotoxicosis, unspecified without thyrotoxic crisis or storm: Secondary | ICD-10-CM

## 2017-01-07 DIAGNOSIS — N184 Chronic kidney disease, stage 4 (severe): Secondary | ICD-10-CM

## 2017-01-07 DIAGNOSIS — E119 Type 2 diabetes mellitus without complications: Secondary | ICD-10-CM

## 2017-01-07 DIAGNOSIS — D649 Anemia, unspecified: Secondary | ICD-10-CM

## 2017-01-07 DIAGNOSIS — E440 Moderate protein-calorie malnutrition: Secondary | ICD-10-CM

## 2017-01-07 DIAGNOSIS — F0390 Unspecified dementia without behavioral disturbance: Secondary | ICD-10-CM

## 2017-01-07 DIAGNOSIS — L97909 Non-pressure chronic ulcer of unspecified part of unspecified lower leg with unspecified severity: Secondary | ICD-10-CM

## 2017-01-07 LAB — BASIC METABOLIC PANEL
Anion gap: 3 — ABNORMAL LOW (ref 5–15)
BUN: 14 mg/dL (ref 6–20)
CALCIUM: 7.7 mg/dL — AB (ref 8.9–10.3)
CO2: 18 mmol/L — AB (ref 22–32)
CREATININE: 1.71 mg/dL — AB (ref 0.44–1.00)
Chloride: 126 mmol/L — ABNORMAL HIGH (ref 101–111)
GFR calc Af Amer: 32 mL/min — ABNORMAL LOW (ref >60)
GFR calc non Af Amer: 27 mL/min — ABNORMAL LOW (ref >60)
GLUCOSE: 101 mg/dL — AB (ref 65–99)
Potassium: 3.5 mmol/L (ref 3.5–5.1)
Sodium: 147 mmol/L — ABNORMAL HIGH (ref 135–145)

## 2017-01-07 LAB — CBC WITH DIFFERENTIAL/PLATELET
BASOS ABS: 0 10*3/uL (ref 0.0–0.1)
Basophils Relative: 0 %
Eosinophils Absolute: 0.1 10*3/uL (ref 0.0–0.7)
Eosinophils Relative: 1 %
HEMATOCRIT: 24.5 % — AB (ref 36.0–46.0)
Hemoglobin: 7.8 g/dL — ABNORMAL LOW (ref 12.0–15.0)
LYMPHS ABS: 2.1 10*3/uL (ref 0.7–4.0)
Lymphocytes Relative: 22 %
MCH: 25.6 pg — ABNORMAL LOW (ref 26.0–34.0)
MCHC: 31.8 g/dL (ref 30.0–36.0)
MCV: 80.3 fL (ref 78.0–100.0)
MONO ABS: 1.2 10*3/uL — AB (ref 0.1–1.0)
MONOS PCT: 12 %
NEUTROS ABS: 6.3 10*3/uL (ref 1.7–7.7)
Neutrophils Relative %: 65 %
PLATELETS: 313 10*3/uL (ref 150–400)
RBC: 3.05 MIL/uL — ABNORMAL LOW (ref 3.87–5.11)
RDW: 21.5 % — AB (ref 11.5–15.5)
WBC: 9.7 10*3/uL (ref 4.0–10.5)

## 2017-01-07 LAB — GLUCOSE, CAPILLARY
GLUCOSE-CAPILLARY: 107 mg/dL — AB (ref 65–99)
GLUCOSE-CAPILLARY: 87 mg/dL (ref 65–99)
Glucose-Capillary: 78 mg/dL (ref 65–99)

## 2017-01-07 LAB — MAGNESIUM: Magnesium: 1.3 mg/dL — ABNORMAL LOW (ref 1.7–2.4)

## 2017-01-07 MED ORDER — MORPHINE SULFATE (PF) 4 MG/ML IV SOLN
1.0000 mg | INTRAVENOUS | Status: DC | PRN
  Administered 2017-01-07: 2 mg via INTRAVENOUS
  Filled 2017-01-07: qty 1

## 2017-01-07 MED ORDER — MAGNESIUM SULFATE 2 GM/50ML IV SOLN
2.0000 g | Freq: Once | INTRAVENOUS | Status: AC
  Administered 2017-01-07: 2 g via INTRAVENOUS
  Filled 2017-01-07: qty 50

## 2017-01-07 MED ORDER — GLYCOPYRROLATE 0.2 MG/ML IJ SOLN: 0.2000 mg | INTRAMUSCULAR | Status: DC | PRN

## 2017-01-07 MED ORDER — GLYCOPYRROLATE 0.2 MG/ML IJ SOLN: 0.2000 mg | INTRAMUSCULAR | Status: AC | PRN

## 2017-01-07 MED ORDER — DEXTROSE 5 % IV SOLN
INTRAVENOUS | Status: DC
  Administered 2017-01-07: 10:00:00 via INTRAVENOUS

## 2017-01-07 NOTE — Progress Notes (Signed)
Daily Progress Note   Patient Name: Sylvia Medina       Date: 01/07/2017 DOB: 03-26-1935  Age: 81 y.o. MRN#: [id number] Attending Physician: Briant Cedar, MD Primary Care Physician: Patient, No Pcp Per Admit Date: 01/03/2017  Reason for Consultation/Follow-up: Establishing goals of care, Inpatient hospice referral, Non pain symptom management, Pain control and Psychosocial/spiritual support  Subjective: Patient remains encephalopathic despite being treated with anticonvulsants as well as antibiotics for underlying sepsis.  Family meeting today with daughter Hillary Bow and son Shon Hale.  Length of Stay: 3  Current Medications: Scheduled Meds:  . Chlorhexidine Gluconate Cloth  6 each Topical Daily  . mouth rinse  15 mL Mouth Rinse BID  . sodium chloride flush  10-40 mL Intracatheter Q12H  . thiamine injection  250 mg Intravenous Q8H   Followed by  . [START ON 01/11/2017] thiamine injection  100 mg Intravenous Daily    Continuous Infusions: . dextrose 50 mL/hr at 01/07/17 0935  . levETIRAcetam Stopped (01/07/17 0336)  . vancomycin Stopped (01/07/17 0521)    PRN Meds: acetaminophen **OR** acetaminophen, glycopyrrolate, ipratropium-albuterol, morphine injection, ondansetron **OR** ondansetron (ZOFRAN) IV, sodium chloride flush  Physical Exam  Constitutional: She appears well-developed and well-nourished.  Acutely ill-appearing older female; unresponsive to voice and light touch  Cardiovascular:  Tachycardic  Pulmonary/Chest:  Shallow, irregular  Neurological:  Unresponsive to voice and light touch  Skin:  Cool, dry  Psychiatric:  Unable to test  Nursing note and vitals reviewed.           Vital Signs: BP (!) 104/52   Pulse 81   Temp 98.8 F (37.1 C) (Axillary)    Resp 19   Ht 5\' 6"  (1.676 m)   Wt 122.9 kg (271 lb)   SpO2 95%   BMI 43.74 kg/m  SpO2: SpO2: 95 % O2 Device: O2 Device: Not Delivered O2 Flow Rate:    Intake/output summary:   Intake/Output Summary (Last 24 hours) at 01/07/2017 1221 Last data filed at 01/07/2017 1018 Gross per 24 hour  Intake 1463.75 ml  Output 1000 ml  Net 463.75 ml   LBM: Last BM Date: 12/21/16 Baseline Weight: Weight: 122.9 kg (271 lb) Most recent weight: Weight: 122.9 kg (271 lb)       Palliative Assessment/Data:    Flowsheet Rows  Most Recent Value  Intake Tab  Referral Department  Hospitalist  Unit at Time of Referral  Intermediate Care Unit  Palliative Care Primary Diagnosis  Sepsis/Infectious Disease  Date Notified  01/06/17  Palliative Care Type  Return patient Palliative Care  Reason for referral  Clarify Goals of Care  Date of Admission  01/03/17  Date first seen by Palliative Care  01/06/17  # of days Palliative referral response time  0 Day(s)  # of days IP prior to Palliative referral  3  Clinical Assessment  Palliative Performance Scale Score  30%  Pain Max last 24 hours  Not able to report  Pain Min Last 24 hours  Not able to report  Dyspnea Max Last 24 Hours  Not able to report  Dyspnea Min Last 24 hours  Not able to report  Nausea Max Last 24 Hours  Not able to report  Nausea Min Last 24 Hours  Not able to report  Anxiety Max Last 24 Hours  Not able to report  Anxiety Min Last 24 Hours  Not able to report  Other Max Last 24 Hours  Not able to report  Psychosocial & Spiritual Assessment  Palliative Care Outcomes  Patient/Family meeting held?  Yes  Who was at the meeting?  scheduled 11/24 11am  Palliative Care follow-up planned  Yes, Facility      Patient Active Problem List   Diagnosis Date Noted  . Acute encephalopathy 01/04/2017  . Hypoglycemia 01/04/2017  . Hypokalemia 12/20/2016  . Goals of care, counseling/discussion   . Palliative care encounter   .  Osteomyelitis (HCC) 11/17/2016  . Lower extremity ulceration (HCC) 11/17/2016  . Encephalopathy 11/17/2016  . Hyperthyroidism 11/13/2016  . Atherosclerosis of native arteries of extremities with gangrene, left leg (HCC) 10/27/2016  . Atherosclerosis of artery of extremity with ulceration (HCC) 10/27/2016  . CKD (chronic kidney disease) stage 4, GFR 15-29 ml/min (HCC) 06/15/2016  . AKI (acute kidney injury) (HCC) 06/06/2016  . Acute lower UTI 06/06/2016  . Acute cystitis with hematuria   . Hypernatremia 08/31/2015  . Chronic constipation 07/27/2015  . Protein-calorie malnutrition (HCC) 07/27/2015  . Alzheimer's disease 09/18/2014  . Hyperlipidemia 09/18/2014  . Anemia of chronic disease 09/18/2014  . Essential hypertension, benign 10/07/2013  . Thyroiditis, subacute 04/14/2013  . Dementia without behavioral disturbance 02/10/2013  . Vitamin D deficiency 07/04/2012  . Depression 07/04/2012  . Diabetes mellitus type 2, diet-controlled (HCC) 07/04/2012  . CVA (cerebral vascular accident) (HCC) 07/04/2012  . Cardiac arrhythmia 07/04/2012  . Dementia 07/04/2012  . Anemia 07/04/2012    Palliative Care Assessment & Plan   Patient Profile: 81 y.o. female  with past medical history of peripheral vascular disease status post stent femoral as well as right popliteal, diabetes type 2, chronic kidney disease stage IV, dementia, remote CVA, chronic lower extremity ulcerations with underlying osteomyelitis admitted on 01/03/2017 with altered mental status.  Patient resides in a skilled nursing facility.  She has been seen by palliative medicine in October 2018 for goals of care discussion related to severe peripheral vascular disease.  Per patient's daughter, patient had apparently been doing relatively well, she was talking and laughing when last seen well at the nursing home 11/15 2018.  Per chart review, previous palliative medicine provider note, the family had to declined amputation in the  setting of severe peripheral vascular disease (ABI performed October 2018 showed severe arterial disease on the left and moderate on the right.  She is at baseline nonambulatory).  Plan at that time was to continue for 6-8 weeks of antibiotics (which per daughter she did complete).  While in the emergency room she began to posture and was questioning whether she was having underlying seizures well.  Per chart review also Warnicke's encephalopathy is also a concern.  Was also hypoglycemic.  Per chart review,   attending last night called to the bedside because of patient decline. Daughter as well as patient's son are aware that she could be  trending towards end-of-life.  Consult for further goals of care    Assessment: Family meeting scheduled today with daughter Hillary BowLeona, and son Shon HaleLeon.  Patient's cousin, Estrellita LudwigVerna Middlebrook also at meeting.  All parties are able to articulate a long functional decline.  Patient had a stroke approximately 13 years ago and has been bedbound since.  She was seen by palliative medicine provider in October 2018 where family was trying 6-8 weeks of further antibiotics to heal lower extremity ulcerations and avoid amputation (patient and family have been consistent in stating that patient would not want to pursue amputation and that is the case still today).  They now see that despite antibiotics anticonvulsants fluid support that she is not rebounding.  Hospice was discussed both support in a skilled nursing facility as well as residential hospice.  I do feel given patient's ongoing encephalopathy, inability to eat, patient and family goals of no amputation, artificial feeding that she likely would meet residential hospice criteria for a prognosis of less than 2 weeks.  Recommendations/Plan:  Transfer to residential hospice.  Offered choice per Medicare guidelines.  Family elects [facility].  Order placed to social work as well as hospital liaison with hosp[facility] and [facility] of [facility] as well as report given  Patient now comfort care.  Antibiotics discontinued continue IV fluids until discharge  We will add low-dose morphine for pain or dyspnea  We will add as needed, Robinul for secretions  Goals of Care and Additional Recommendations:  Limitations on Scope of Treatment: Full Comfort Care  Code Status:    Code Status Orders  (From admission, onward)        Start     Ordered   01/05/17 1954  Do not attempt resuscitation (DNR)  Continuous    Question Answer Comment  In the event of cardiac or respiratory ARREST Do not call a "code blue"   In the event of cardiac or respiratory ARREST Do not perform Intubation, CPR, defibrillation or ACLS   In the event of cardiac or respiratory ARREST Use medication by any route, position, wound care, and other measures to relive pain and suffering. May use oxygen, suction and manual treatment of airway obstruction as needed for comfort.      01/05/17 1953    Code Status History    Date Active Date Inactive Code Status Order ID Comments User Context   01/04/2017 01:45 01/05/2017 19:53 Full Code [id number]  Clydie BraunSmith, Rondell A, MD ED   12/20/2016 23:24 12/22/2016 18:26 Full Code [id number]  Briscoe Deutscherpyd, Timothy S, MD ED   11/17/2016 22:32 12/05/2016 18:32 Full Code [id number]  Jonah BlueYates, Jennifer, MD ED   10/27/2016 17:59 10/31/2016 17:49 Full Code [id number]  Fransisco Hertzhen, Brian L, MD Inpatient   06/06/2016 16:50 06/09/2016 17:54 DNR [id number]  Ozella RocksMerrell, David J, MD ED   07/11/2013 15:34 06/06/2016 12:22 Full Code [id number]  Sharee HolsterGreen, Deborah S, NP Outpatient    Advance Directive Documentation     Most Recent Value  Type of Advance  Directive  Healthcare Power of Attorney  Pre-existing out of facility DNR order (yellow form or pink MOST form)  No data  "MOST" Form in Place?  No data       Prognosis:   < 2 weeks in the setting of ongoing encephalopathy, sepsis secondary to severe peripheral disease vascular disease,  nonhealing lower extremity ulcers, chronic kidney disease stage IV, dementia, diabetes, remote CVA, bedbound times 13 years  Discharge Planning:  Hospice facility  Care plan was discussed with Adele BarthelAmy Evans with [facility]; Dr. Sharolyn DouglasEzenduka  Thank you for allowing the Palliative Medicine Team to assist in the care of this patient.   Time In: 1100 Time Out: 1145 Total Time 45 min Prolonged Time Billed  no       Greater than 50%  of this time was spent counseling and coordinating care related to the above assessment and plan.  Irean HongSarah Grace Kaevon Cotta, NP  Please contact Palliative Medicine Team phone at [phone] for questions and concerns.

## 2017-01-07 NOTE — Progress Notes (Signed)
CSW spoke with pt's daughter and informed her that at this time pt would be being [facility]. CSW has called for [facility] and placed all needed documents on the chart. At this time there are no further CSW needs. CSW signing off.      Claude MangesKierra S. Arrian Manson, MSW, LCSW-A Emergency Department Clinical Social Worker [phone]

## 2017-01-07 NOTE — Progress Notes (Addendum)
[facility]. Patient & Daughter, Sylvia Medina, aware. Discharge packet given to RN. [facility] called for transport.   Please cap PICC line and take out all peripheral lines.   Stacy GardnerErin Amon Costilla, LCSWA Clinical Social Worker [phone]

## 2017-01-07 NOTE — Progress Notes (Signed)
PROGRESS NOTE  Sylvia Medina [place] ZOX:[id number] DOB: 1935-04-22 DOA: 01/03/2017 PCP: Patient, No Pcp Per  HPI/Recap of past 24 hours: HPI on 01/04/2017 by Dr. Madelyn Flavorsondell Smith Arnette FeltsEvelyn Medina Goolsbyis a 81 y.o.femalewith medical history significant of dementia, HTN, H/O CVA, CKD stage IV, chronic normocytic anemia, chronic decubitus ulcer, PVD s/p right popliteal stenting, osteomyelitis, hyperthyroidism; who presented after being found to be acutely altered. Patient was last noted to be at baseline on 11/19 for which family notes patient is able to appropriately interact and speak although speech is slurred. Now patient will not interact and has not been speaking. Family notes that the patient is prone to urinary tract infections. They did not note any recent reportsof fevers, cough, abdominal pain, nausea, or vomiting. She has no known history of seizure disorder.Patient previously hospitalized from 10/4-10/22,for osteomyelitis treated with IV antibiotics for which the patientjust recently completed. Subsequently patient was readmitted into the hospital on 11/6-11/8 for hyponatremia related to dehydration.  Today, pt noted to be still non-verbal, unable to communicate, more easily arousable, ill appearing, didn't look like she was in distress. Unable to do ROS.  Assessment/Plan: Principal Problem:   Acute encephalopathy Active Problems:   Diabetes mellitus type 2, diet-controlled (HCC)   Dementia without behavioral disturbance   Anemia of chronic disease   Protein-calorie malnutrition (HCC)   CKD (chronic kidney disease) stage 4, GFR 15-29 ml/min (HCC)   Hyperthyroidism   Lower extremity ulceration (HCC)   Hypoglycemia  Acute metabolic encephalopathy -Not improved -The patient found to be altered on admission with posturing and mouth smacking, concern for seizure-like activity -Possible etiology Wernicke encephalopathy Vs seizure Vs hypoglycemia Vs infection -Neurology consulted  and appreciated -Patient given Keppra load, Ativan -CT head showed mild to moderate central atrophy with chronic small vessel ischemic disease. Chronic left external capsule infarct. No acute intracranial normality. -EEG abnormal due to moderate diffuse background slowing-indicates diffuse cerebral dysfunction that is nonspecific in etiology and can be seen with hypoxic/ischemic injury, toxic/metabolic encephalopathies, neurodegenerative disorders or medication effect. -MRI brain: Midbrain signal abnormality can occur in acute encephalopathy. Splenium of signal abnormality can be seen as a sequelae of seizure activity and also metabolic disorders. Small vessel disease type hemorrhage in the left external capsule. -Thiamine level pending -Continue neuro checks -patient started on thiamine replacement- 500mg  IV TID x 3 days. Followed by 250mg  IV TID x 3 days, folowed by 100mg  PO or IV daily thereafter.  -Ammonia level was slightly elevated 42, currently normalized (with no intervention) -Continue keppra  Diabetes mellitus, type II complicated by Hypoglycemia -Currently diet controlled diabetes, not on any oral hypoglycemics or insulin -Blood glucose on admission 62 -Was given D50 on admission -Currently on D5W, changed from NS due to hypernatremia/chloremia -Continue to monitor CBGs  Sepsis secondary to Lower extremity ulceration, acute on chronic Not improved -Patient noted to be hypothermic, tachycardic, tachypneic on admission -Spiked a temp of 101 on 11/23 but could be due to bed warmer -Of note, back in Oct 2018, amputation was recommended as patient waas not a candidate for revascularization -Patient recently completed course of antibiotics as an outpatient -Wound care consulted -UA negative -CXR: cardiomegaly with mild pulm vasc congestion -Blood cultures, no growth till date -Urine culture shows  <10K colonies, insignificant growth -Continue Vancomycin, cefepime,  Flagyl -discussed possibility of amputation with daughter, however, at this time she declines as her mother declined in the past.  Hypomagnesemia/Hypernatremia/hypokalemia Replace as needed Changed NS to D5w as pt is not tolerating  orally and due to hypernatremia  Hyperthyroidism -Continue Tapazole when able to tolerate PO -TSH 10.759, T4 1.28, TSH elevated likely due to acute severe illness, continue to monitor  -Patient follows with Dr. Romero BellingSean Ellison  Normocytic normochromic anemia -Hemoglobin currently 8.6-->7.8, no signs of bleeding -Continue to monitor CBC  Chronic kidney disease, stage IV -Creatinine currently 1.61,->1.7  rising -Continue to monitor BMP  Dementia -Unable to assess  History of CVA -Holding home meds for now  Chronic protein calorie malnutrition -prealbumin 10.5, low -Albumin 1.7 on admission  Goals of care -discussed with patient's daughter, currently DNR. Palliative consulted, pt daughter wants to still give IVF/IV meds. Another meeting with palliative team scheduled for 01/07/17. Awaits recs   Code Status: DNR  Family Communication: Cousin at bedside  Disposition Plan: Pending palliative team and prognosis of patient   Consultants:  Neurology  Procedures:  EEG  Antimicrobials:  IV cefepime  IV Vancomycin  IV Flagyl  DVT prophylaxis:  Heparin   Objective: Vitals:   01/07/17 0500 01/07/17 0539 01/07/17 0600 01/07/17 0748  BP: (!) 96/45  (!) 104/52   Pulse: 83  81   Resp: 19  19   Temp:  (!) 97.4 F (36.3 C)  98.8 F (37.1 C)  TempSrc:  Axillary  Axillary  SpO2: 99%  95%   Weight:      Height:        Intake/Output Summary (Last 24 hours) at 01/07/2017 1146 Last data filed at 01/07/2017 1018 Gross per 24 hour  Intake 1463.75 ml  Output 1200 ml  Net 263.75 ml   Filed Weights   01/04/17 1754  Weight: 122.9 kg (271 lb)    Exam:   General:  NAD, sleepy, slightly easily arousable, unable to follow  commands, not alert/oriented  Cardiovascular: S1, s2 present, no added hrt sound  Respiratory: Chest clear bilaterally  Abdomen: Soft, obese, non-tender, non-distended, +BS  Musculoskeletal: No bilateral pedal edema, obese extremities  Skin: Multiple ulcerations of lower extremities  Psychiatry: Unable to assess    Data Reviewed: CBC: Recent Labs  Lab 01/03/17 2132 01/04/17 0955 01/05/17 0440 01/06/17 0511 01/07/17 0259  WBC 8.9 11.3* 11.2* 10.0 9.7  NEUTROABS 5.1 8.8*  --   --  6.3  HGB 9.5* 8.5* 8.6* 7.8* 7.8*  HCT 30.5* 27.6* 27.8* 24.9* 24.5*  MCV 84.5 84.1 81.5 80.6 80.3  PLT 313 302 312 293 313   Basic Metabolic Panel: Recent Labs  Lab 01/03/17 2132 01/04/17 0955 01/05/17 0440 01/06/17 0511 01/07/17 0259  NA 141 143 144 147* 147*  K 3.8 3.4* 3.3* 3.4* 3.5  CL 119* 120* 122* 126* 126*  CO2 17* 17* 16* 18* 18*  GLUCOSE 62* 102* 100* 98 101*  BUN 21* 19 18 16 14   CREATININE 1.81* 1.63* 1.61* 1.70* 1.71*  CALCIUM 8.1* 8.0* 7.6* 7.7* 7.7*  MG  --   --   --   --  1.3*   GFR: Estimated Creatinine Clearance: 35.7 mL/min (A) (by C-G formula based on SCr of 1.71 mg/dL (H)). Liver Function Tests: Recent Labs  Lab 01/03/17 2132  AST 35  ALT 15  ALKPHOS 345*  BILITOT 0.8  PROT 5.5*  ALBUMIN 1.7*   No results for input(s): LIPASE, AMYLASE in the last 168 hours. Recent Labs  Lab 01/04/17 0008 01/05/17 0930  AMMONIA 42* 35   Coagulation Profile: No results for input(s): INR, PROTIME in the last 168 hours. Cardiac Enzymes: Recent Labs  Lab 01/03/17 2137  TROPONINI <0.03  BNP (last 3 results) No results for input(s): PROBNP in the last 8760 hours. HbA1C: No results for input(s): HGBA1C in the last 72 hours. CBG: Recent Labs  Lab 01/06/17 1601 01/06/17 1915 01/06/17 2307 01/07/17 0353 01/07/17 0752  GLUCAP 92 101* 92 107* 87   Lipid Profile: No results for input(s): CHOL, HDL, LDLCALC, TRIG, CHOLHDL, LDLDIRECT in the last 72  hours. Thyroid Function Tests: Recent Labs    01/05/17 0930 01/05/17 2051  TSH 10.759*  --   FREET4  --  1.28*  T3FREE 2.5  --    Anemia Panel: No results for input(s): VITAMINB12, FOLATE, FERRITIN, TIBC, IRON, RETICCTPCT in the last 72 hours. Urine analysis:    Component Value Date/Time   COLORURINE YELLOW 01/03/2017 2354   APPEARANCEUR CLEAR 01/03/2017 2354   LABSPEC 1.012 01/03/2017 2354   PHURINE 5.0 01/03/2017 2354   GLUCOSEU 50 (A) 01/03/2017 2354   HGBUR NEGATIVE 01/03/2017 2354   BILIRUBINUR NEGATIVE 01/03/2017 2354   KETONESUR NEGATIVE 01/03/2017 2354   PROTEINUR NEGATIVE 01/03/2017 2354   NITRITE NEGATIVE 01/03/2017 2354   LEUKOCYTESUR NEGATIVE 01/03/2017 2354   Sepsis Labs: @LABRCNTIP (procalcitonin:4,lacticidven:4)  ) Recent Results (from the past 240 hour(s))  Culture, blood (routine x 2)     Status: None (Preliminary result)   Collection Time: 01/03/17  9:50 PM  Result Value Ref Range Status   Specimen Description BLOOD LEFT HAND  Final   Special Requests   Final    BOTTLES DRAWN AEROBIC ONLY Blood Culture results may not be optimal due to an inadequate volume of blood received in culture bottles   Culture NO GROWTH 3 DAYS  Final   Report Status PENDING  Incomplete  Culture, blood (routine x 2)     Status: None (Preliminary result)   Collection Time: 01/03/17 10:29 PM  Result Value Ref Range Status   Specimen Description BLOOD RIGHT HAND  Final   Special Requests IN PEDIATRIC BOTTLE Blood Culture adequate volume  Final   Culture NO GROWTH 2 DAYS  Final   Report Status PENDING  Incomplete  Urine culture     Status: Abnormal   Collection Time: 01/03/17 11:54 PM  Result Value Ref Range Status   Specimen Description URINE, CATHETERIZED  Final   Special Requests NONE  Final   Culture <10,000 COLONIES/mL INSIGNIFICANT GROWTH (A)  Final   Report Status 01/05/2017 FINAL  Final  MRSA PCR Screening     Status: None   Collection Time: 01/04/17  5:34 PM   Result Value Ref Range Status   MRSA by PCR NEGATIVE NEGATIVE Final    Comment:        The GeneXpert MRSA Assay (FDA approved for NASAL specimens only), is one component of a comprehensive MRSA colonization surveillance program. It is not intended to diagnose MRSA infection nor to guide or monitor treatment for MRSA infections.       Studies: No results found.  Scheduled Meds: . Chlorhexidine Gluconate Cloth  6 each Topical Daily  . heparin  5,000 Units Subcutaneous Q8H  . mouth rinse  15 mL Mouth Rinse BID  . sodium chloride flush  10-40 mL Intracatheter Q12H  . thiamine injection  250 mg Intravenous Q8H   Followed by  . [START ON 01/11/2017] thiamine injection  100 mg Intravenous Daily    Continuous Infusions: . ceFEPime (MAXIPIME) IV Stopped (01/07/17 0525)  . dextrose 50 mL/hr at 01/07/17 0935  . levETIRAcetam Stopped (01/07/17 0336)  . metronidazole Stopped (01/07/17 1610)  .  thiamine injection 0 mg (01/06/17 2213)  . vancomycin Stopped (01/07/17 0521)     LOS: 3 days     Briant Cedar, MD [facility]   If 7PM-7AM, please contact night-coverage [email or web address] Password [facility] 01/07/2017, 11:46 AM

## 2017-01-07 NOTE — Progress Notes (Signed)
[facility]ia[facility]:  RN  Received request from Eduard RouxSarah Bullard, NP, PMT, for family interest [facility] with request for transfer today.  Maralyn Sago(Sarah did offer choice).  Chart reviewed.  Spoke with Hillary BowLeona, daughter, to confirm interest and explain services.  Family agreeable to transfer today.  Erin, LCSW, aware.  Registration paperwork to be completed at 2:00pm today with Hillary BowLeona, daughter.  Dr. Kern Reaponald Hertweck to assume care per family request.  Please fax discharge summary to [phone].  RN, please call report to [phone].  Please arrange transport for patient to arrive as soon as possible, once paperwork is complete.    Thank you for this referral,  Adele BarthelAmy Evans, RN, BSN [facility]iaison [phone]  All hospital liaisons are now on AMION.

## 2017-01-07 NOTE — Progress Notes (Signed)
Report given to Rosey Batheresa, Charity fundraiserN at [facility].  [facility]

## 2017-01-07 NOTE — Discharge Summary (Signed)
Discharge Summary  Sylvia Medina AVW:[id number] DOB: June 11, 1935  PCP: Patient, No Pcp Per  Admit date: 01/03/2017 Discharge date: 01/07/2017  Time spent: >34mins  Recommendations for Outpatient Follow-up:  Hospice care  Discharge Diagnoses:  Active Hospital Problems   Diagnosis Date Noted  . Acute encephalopathy 01/04/2017  . Palliative care by specialist   . Hypoglycemia 01/04/2017  . Lower extremity ulceration (HCC) 11/17/2016  . Hyperthyroidism 11/13/2016  . CKD (chronic kidney disease) stage 4, GFR 15-29 ml/min (HCC) 06/15/2016  . Protein-calorie malnutrition (HCC) 07/27/2015  . Anemia of chronic disease 09/18/2014  . Dementia without behavioral disturbance 02/10/2013  . Diabetes mellitus type 2, diet-controlled (HCC) 07/04/2012    Resolved Hospital Problems  No resolved problems to display.    Discharge Condition: Poor   Diet recommendation: Per hospice  Vitals:   01/07/17 0748 01/07/17 1234  BP:  (!) 96/33  Pulse:  89  Resp:  19  Temp: 98.8 F (37.1 C) 99.1 F (37.3 C)  SpO2:  96%    History of present illness:  HPI on 01/04/2017 by Dr. Idelia Salm a 81 y.o.femalewith medical history significant of dementia, HTN, H/O CVA, CKD stage IV, chronic normocytic anemia, chronic decubitus ulcer, PVD s/p right popliteal stenting, osteomyelitis, hyperthyroidism; who presented after being found to be acutely altered. Patient was last noted to be at baseline on 11/19 for which family notes patient is able to appropriately interact and speak although speech is slurred. Now patient will not interact and has not been speaking. Family notes that the patient is prone to urinary tract infections. They did not note any recent reportsof fevers, cough, abdominal pain, nausea, or vomiting. She has no known history of seizure disorder.Patient previously hospitalized from 10/4-10/22,for osteomyelitis treated with IV antibiotics for which the  patientjust recently completed. Subsequently patient was readmitted into the hospital on 11/6-11/8 for hyponatremia related to dehydration.  Today, pt noted to be still non-verbal, unable to communicate, more easily arousable, ill appearing, didn't look like she was in distress. Unable to do ROS. Transfer to hospice   Hospital Course:  Principal Problem:   Acute encephalopathy Active Problems:   Diabetes mellitus type 2, diet-controlled (HCC)   Dementia without behavioral disturbance   Anemia of chronic disease   Protein-calorie malnutrition (HCC)   CKD (chronic kidney disease) stage 4, GFR 15-29 ml/min (HCC)   Hyperthyroidism   Lower extremity ulceration (HCC)   Hypoglycemia   Palliative care by specialist  Acute metabolic encephalopathy -Not improved -The patient found to be altered on admission with posturing and mouth smacking, concern for seizure-like activity -Possible etiology Wernicke encephalopathy Vs seizure Vs hypoglycemia Vs infection -Neurology consulted and appreciated -Patient given Keppra load, Ativan -CT head showed mild to moderate central atrophy with chronic small vessel ischemic disease. Chronic left external capsule infarct. No acute intracranial normality. -EEG abnormal due to moderate diffuse background slowing-indicates diffuse cerebral dysfunction that is nonspecific in etiology and can be seen with hypoxic/ischemic injury, toxic/metabolic encephalopathies, neurodegenerative disorders or medication effect. -MRI brain: Midbrain signal abnormality can occur in acute encephalopathy. Splenium of signal abnormality can be seen as a sequelae of seizure activity and also metabolic disorders. Small vessel disease type hemorrhage in the left external capsule -May continue keppra as per family/hospice  Diabetes mellitus, type II complicated by Hypoglycemia -Currently diet controlled diabetes, not on any oral hypoglycemics or insulin -Blood glucose on admission  62 -Was given D50 on admission -Currently on D5W, consider stopping  Sepsis  secondary to Lower extremity ulceration, acute on chronic Not improved -Patient noted to be hypothermic, tachycardic, tachypneic on admission -Spiked a temp of 101 on 11/23 but could be due to bed warmer -Of note, back in Oct 2018, amputation was recommended as patient waas not a candidate for revascularization -Patient recently completed course of antibiotics as an outpt -UA negative -CXR: cardiomegaly with mild pulm vasc congestion -Blood cultures, no growth till date -Urine culture shows <10K colonies, insignificant growth -DiscontinueVancomycin, cefepime, flagyl -Hospice care  Hypomagnesemia/Hypernatremia/hypokalemia Currently hospice  Hyperthyroidism -Discontinue Tapazole as hospice -TSH 10.759, T4 1.28, TSH elevated likely due to acute severe illness  Normocytic normochromic anemia -Hemoglobin currently 8.6-->7.8, no signs of bleeding  Chronic kidney disease, stage IV -Creatinine currently 1.61,->1.7  Dementia -Unable to assess  History of CVA -Hospice  Chronic protein calorie malnutrition -prealbumin 10.5, low -Albumin 1.7 on admission Hospice  Goals of care -discussed with patient's daughter, currently DNR. Pt is admitted to hospice care    Procedures:  None  Consultations:  Neurology  Discharge Exam: BP (!) 96/33 (BP Location: Left Wrist)   Pulse 89   Temp 99.1 F (37.3 C) (Axillary)   Resp 19   Ht 5\' 6"  (1.676 m)   Wt 122.9 kg (271 lb)   SpO2 96%   BMI 43.74 kg/m   General: NAD, sleepy, slightly easily arousable, unable to follow commands, not alert/oriented Cardiovascular: S1-S2, no added hrt sounds Respiratory: Chest clear bilaterally  Discharge Instructions You were cared for by a hospitalist during your hospital stay. If you have any questions about your discharge medications or the care you received while you were in the hospital after you are  discharged, you can call the unit and asked to speak with the hospitalist on call if the hospitalist that took care of you is not available. Once you are discharged, your primary care physician will handle any further medical issues. Please note that NO REFILLS for any discharge medications will be authorized once you are discharged, as it is imperative that you return to your primary care physician (or establish a relationship with a primary care physician if you do not have one) for your aftercare needs so that they can reassess your need for medications and monitor your lab values.  Discharge Instructions    Diet - low sodium heart healthy   Complete by:  As directed    Increase activity slowly   Complete by:  As directed      Allergies as of 01/07/2017   No Known Allergies     Medication List    STOP taking these medications   aspirin EC 81 MG tablet   atorvastatin 10 MG tablet Commonly known as:  LIPITOR   cefTRIAXone 2 g in dextrose 5 % 50 mL   Cholecalciferol 1000 units tablet   clopidogrel 75 MG tablet Commonly known as:  PLAVIX   collagenase ointment Commonly known as:  SANTYL   feeding supplement (PRO-STAT SUGAR FREE 64) Liqd   ferrous sulfate 325 (65 FE) MG tablet   methimazole 10 MG tablet Commonly known as:  TAPAZOLE   metoprolol tartrate 25 MG tablet Commonly known as:  LOPRESSOR   metroNIDAZOLE 500 MG tablet Commonly known as:  FLAGYL   multivitamin tablet   oxyCODONE-acetaminophen 5-325 MG tablet Commonly known as:  ROXICET   rivastigmine 13.3 MG/24HR Commonly known as:  EXELON     TAKE these medications   acetaminophen 325 MG tablet Commonly known as:  TYLENOL Take 650  mg by mouth every 6 (six) hours as needed.   glycopyrrolate 0.2 MG/ML injection Commonly known as:  ROBINUL Inject 1 mL (0.2 mg total) into the vein every 4 (four) hours as needed (secretions).   polyethylene glycol powder powder Commonly known as:  GLYCOLAX/MIRALAX Take  17 g by mouth daily. Mix in 4-8 ounces of Orange Juice and take by mouth daily for constipation.      No Known Allergies    The results of significant diagnostics from this hospitalization (including imaging, microbiology, ancillary and laboratory) are listed below for reference.    Significant Diagnostic Studies: Ct Head Wo Contrast  Result Date: 01/03/2017 CLINICAL DATA:  Decreased mental status over the past 24 hours. Altered level consciousness. EXAM: CT HEAD WITHOUT CONTRAST TECHNIQUE: Contiguous axial images were obtained from the base of the skull through the vertex without intravenous contrast. COMPARISON:  11/26/2004 FINDINGS: BRAIN: There is mild-to-moderate central atrophy. No intraparenchymal hemorrhage, mass effect nor midline shift. Mild periventricular white matter hypodensities consistent with chronic small vessel ischemic disease is noted. Chronic left external capsule infarct along its posterior aspect as before. No acute large vascular territory infarcts. No abnormal extra-axial fluid collections. Basal cisterns are not effaced and midline. VASCULAR: Moderate calcific atherosclerosis of the carotid siphons. SKULL: No skull fracture. No significant scalp soft tissue swelling. SINUSES/ORBITS: The mastoid air-cells are clear. The included paranasal sinuses are well-aerated.The included ocular globes and orbital contents are non-suspicious. OTHER: None. IMPRESSION: Mild-to-moderate central atrophy with chronic small vessel ischemic disease. Chronic left external capsule infarct. No acute intracranial abnormality. Electronically Signed   By: Tollie Eth M.D.   On: 01/03/2017 23:12   Mr Brain Wo Contrast  Result Date: 01/04/2017 CLINICAL DATA:  81 year old female with altered mental status, possible seizure. Sacral decubitus ulcer. EXAM: MRI HEAD WITHOUT CONTRAST TECHNIQUE: Multiplanar, multiecho pulse sequences of the brain and surrounding structures were obtained without intravenous  contrast. COMPARISON:  Head CT without contrast 01/03/2017 and earlier. Brain MRI 09/16/2004 FINDINGS: Study is moderately degraded by motion artifact despite repeated imaging attempts. 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 Suspect some additional brainstem and cerebellar volume loss since 2006. Chronic right pontine lacunar infarct is stable. Confluent abnormal FLAIR hyperintensity in the medial midbrain and anterior periaqueductal gray matter as seen on series 10, image 10. Similar confluent abnormal signal in the splenium of the corpus callosum. There is a small chronic appearing microhemorrhage at the left splenium. These areas appear mildly facilitated on diffusion. Possible associated abnormal T2 and FLAIR hyperintensity in the mamillary bodies (series 10, image 11). No associated signal abnormality in the thalami or basal ganglia. There is a chronic appearing lacunar infarct of the anterior right corona radiata which is near the right caudate. No definite restricted diffusion to suggest acute infarct. However, there is also a widespread area of chronic appearing blood products in the posterior right temporal lobe and right temporal operculum (series 9, image 55). There is also chronic hemosiderin in the left external capsule (series 9, image 50). No definite acute intracranial hemorrhage. No definite extra-axial fluid. No ventriculomegaly. Basilar cisterns are patent. Vascular: Questionable loss of the right ICA siphon flow void (series 6, image 7). Vertebral and left ICA flow void appears stable since 2006. Skull and upper cervical spine: Not well evaluated. Sinuses/Orbits: Postoperative changes to both globes. Otherwise negative orbits soft tissues. Visualized paranasal sinuses and mastoids are stable and well pneumatized. Other: IMPRESSION: 1. Study is limited due  to moderate motion degradation despite repeated imaging attempts. 2.  Confluent but nonspecific abnormal T2 and FLAIR signal in the bilateral splenium of the corpus callosum and the bilateral midbrain. - Diffusion is mildly facilitated suggesting this represents edema and not infarcts. - Midbrain signal abnormality can occur in Wernicke Encephalopathy, and there is questionable associated mamillary body abnormality on these images, which if genuine which strongly suggest the diagnosis. Recommend correlation for Thiamine deficiency. - Splenium signal abnormality can be seen as the sequelae of seizure activity and also metabolic disorders. 3. 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 4. Confluent but nonspecific chronic appearing hemorrhage in the right temporal lobe. 5. Additional small vessel disease type hemorrhage in the left external capsule. Chronic lacune in the right anterior corona radiata. Electronically Signed   By: Odessa FlemingH  Hall M.D.   On: 01/04/2017 08:15   Dg Chest Port 1 View  Result Date: 01/03/2017 CLINICAL DATA:  Altered mental status EXAM: PORTABLE CHEST 1 VIEW COMPARISON:  06/06/2016 FINDINGS: Stable cardiomegaly with moderate aortic atherosclerosis. Mild vascular congestion and redistribution. No significant effusion. No pneumothorax. Scarring and/or atelectasis at the left costophrenic angle. No acute osseous appearing abnormality. Mild dextroconvex curvature of the upper thoracic spine. IMPRESSION: Cardiomegaly with mild pulmonary vascular congestion. Aortic atherosclerosis without aneurysm. Electronically Signed   By: Tollie Ethavid  Kwon M.D.   On: 01/03/2017 22:17    Microbiology: Recent Results (from the past 240 hour(s))  Culture, blood (routine x 2)     Status: None (Preliminary result)   Collection Time: 01/03/17  9:50 PM  Result Value Ref Range Status   Specimen Description BLOOD LEFT HAND  Final   Special Requests   Final    BOTTLES DRAWN AEROBIC ONLY Blood Culture results may not be optimal  due to an inadequate volume of blood received in culture bottles   Culture NO GROWTH 3 DAYS  Final   Report Status PENDING  Incomplete  Culture, blood (routine x 2)     Status: None (Preliminary result)   Collection Time: 01/03/17 10:29 PM  Result Value Ref Range Status   Specimen Description BLOOD RIGHT HAND  Final   Special Requests IN PEDIATRIC BOTTLE Blood Culture adequate volume  Final   Culture NO GROWTH 2 DAYS  Final   Report Status PENDING  Incomplete  Urine culture     Status: Abnormal   Collection Time: 01/03/17 11:54 PM  Result Value Ref Range Status   Specimen Description URINE, CATHETERIZED  Final   Special Requests NONE  Final   Culture <10,000 COLONIES/mL INSIGNIFICANT GROWTH (A)  Final   Report Status 01/05/2017 FINAL  Final  MRSA PCR Screening     Status: None   Collection Time: 01/04/17  5:34 PM  Result Value Ref Range Status   MRSA by PCR NEGATIVE NEGATIVE Final    Comment:        The GeneXpert MRSA Assay (FDA approved for NASAL specimens only), is one component of a comprehensive MRSA colonization surveillance program. It is not intended to diagnose MRSA infection nor to guide or monitor treatment for MRSA infections.      Labs: Basic Metabolic Panel: Recent Labs  Lab 01/03/17 2132 01/04/17 0955 01/05/17 0440 01/06/17 0511 01/07/17 0259  NA 141 143 144 147* 147*  K 3.8 3.4* 3.3* 3.4* 3.5  CL 119* 120* 122* 126* 126*  CO2 17* 17* 16* 18* 18*  GLUCOSE 62* 102* 100* 98 101*  BUN 21* 19  18 16 14   CREATININE 1.81* 1.63* 1.61* 1.70* 1.71*  CALCIUM 8.1* 8.0* 7.6* 7.7* 7.7*  MG  --   --   --   --  1.3*   Liver Function Tests: Recent Labs  Lab 01/03/17 2132  AST 35  ALT 15  ALKPHOS 345*  BILITOT 0.8  PROT 5.5*  ALBUMIN 1.7*   No results for input(s): LIPASE, AMYLASE in the last 168 hours. Recent Labs  Lab 01/04/17 0008 01/05/17 0930  AMMONIA 42* 35   CBC: Recent Labs  Lab 01/03/17 2132 01/04/17 0955 01/05/17 0440 01/06/17 0511  01/07/17 0259  WBC 8.9 11.3* 11.2* 10.0 9.7  NEUTROABS 5.1 8.8*  --   --  6.3  HGB 9.5* 8.5* 8.6* 7.8* 7.8*  HCT 30.5* 27.6* 27.8* 24.9* 24.5*  MCV 84.5 84.1 81.5 80.6 80.3  PLT 313 302 312 293 313   Cardiac Enzymes: Recent Labs  Lab 01/03/17 2137  TROPONINI <0.03   BNP: BNP (last 3 results) No results for input(s): BNP in the last 8760 hours.  ProBNP (last 3 results) No results for input(s): PROBNP in the last 8760 hours.  CBG: Recent Labs  Lab 01/06/17 1915 01/06/17 2307 01/07/17 0353 01/07/17 0752 01/07/17 1233  GLUCAP 101* 92 107* 87 78       Signed:  Briant CedarNkeiruka J Hortensia Duffin, MD [facility] 01/07/2017, 2:21 PM

## 2017-01-08 LAB — CULTURE, BLOOD (ROUTINE X 2): CULTURE: NO GROWTH

## 2017-01-08 LAB — VITAMIN B1: Vitamin B1 (Thiamine): 459.6 nmol/L — ABNORMAL HIGH (ref 66.5–200.0)

## 2017-01-09 LAB — CULTURE, BLOOD (ROUTINE X 2)
CULTURE: NO GROWTH
SPECIAL REQUESTS: ADEQUATE

## 2017-01-14 DEATH — deceased

## 2017-01-17 ENCOUNTER — Other Ambulatory Visit: Payer: Self-pay

## 2017-01-17 DIAGNOSIS — Z48812 Encounter for surgical aftercare following surgery on the circulatory system: Secondary | ICD-10-CM

## 2017-01-17 DIAGNOSIS — L97909 Non-pressure chronic ulcer of unspecified part of unspecified lower leg with unspecified severity: Secondary | ICD-10-CM

## 2017-01-17 DIAGNOSIS — I70299 Other atherosclerosis of native arteries of extremities, unspecified extremity: Secondary | ICD-10-CM

## 2017-01-17 DIAGNOSIS — I70262 Atherosclerosis of native arteries of extremities with gangrene, left leg: Secondary | ICD-10-CM

## 2017-02-09 ENCOUNTER — Encounter (HOSPITAL_COMMUNITY): Payer: Medicare Other

## 2017-02-10 ENCOUNTER — Ambulatory Visit: Payer: Medicare Other | Admitting: Vascular Surgery

## 2017-03-13 ENCOUNTER — Ambulatory Visit: Payer: Medicare Other | Admitting: Endocrinology

## 2018-07-11 IMAGING — XA IR FLUORO GUIDE CV LINE*R*
1 series · 1 of 1 positions shown · IV contrast (agent unspecified)
Comparison: none

INDICATION: Sacral decubitus ulcer with infection, access for antibiotics

EXAM:
ULTRASOUND AND FLUOROSCOPIC RIGHT IJ DOUBLE-LUMEN TUNNELED PICC LINE
INSERTION
MEDICATIONS:
1% LIDOCAINE LOCALLY
CONTRAST:  None
FLUOROSCOPY TIME:  Six seconds (1 mGy)
COMPLICATIONS:
None immediate.
TECHNIQUE: The procedure, risks, benefits, and alternatives were explained to
the patient and informed written consent was obtained. A timeout was
performed prior to the initiation of the procedure.

[Series 300: dsa body · 1 of 1 slices shown]
[im 1/1]
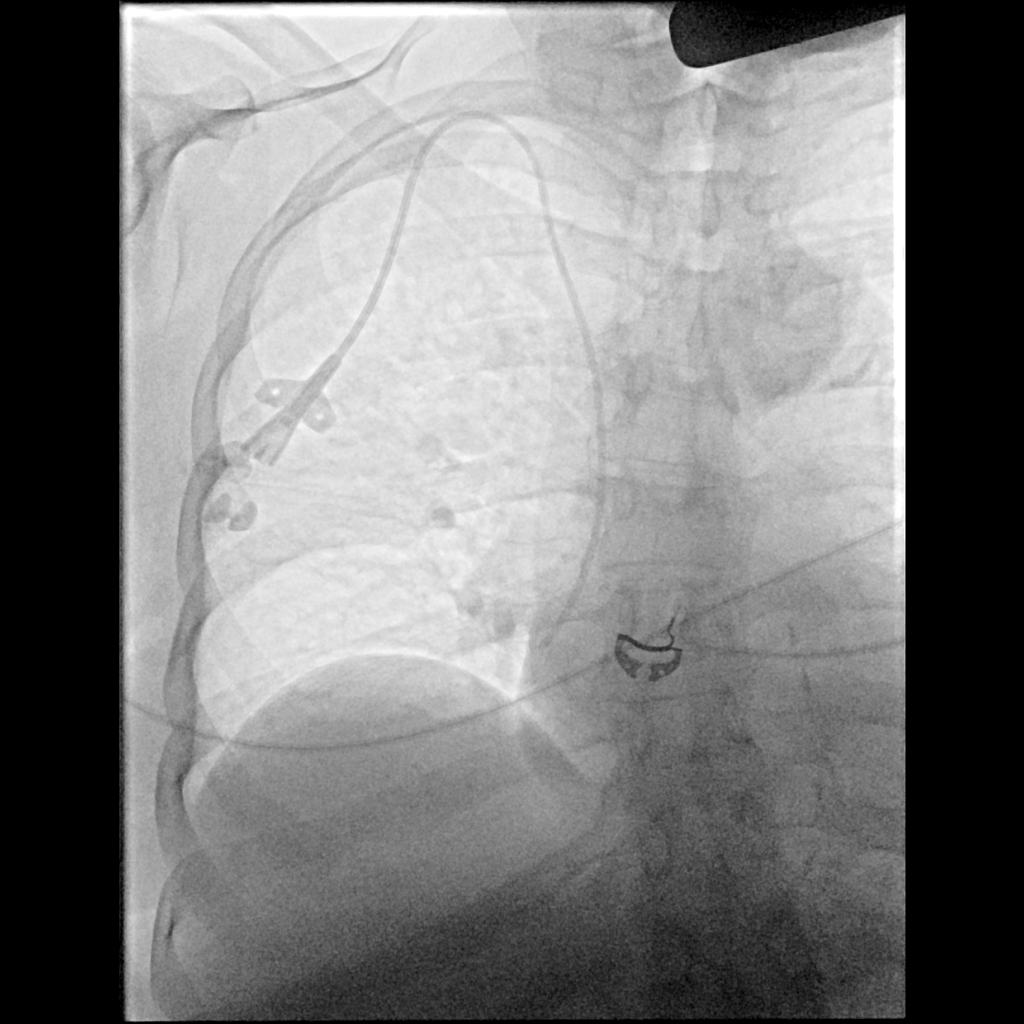

[1 of 1 positions shown; findings below may reference images not displayed]

The right neck and chest were prepped with chlorhexidine in a
sterile fashion, and a sterile drape was applied covering the
operative field. Maximum barrier sterile technique with sterile
gowns and gloves were used for the procedure. A timeout was
performed prior to the initiation of the procedure. Local anesthesia
was provided with 1% lidocaine.

Under direct ultrasound guidance, the right internal jugular vein
was accessed with a micropuncture kit after the overlying soft
tissues were anesthetized with 1% lidocaine. An ultrasound image was
saved for documentation purposes. A guidewire was advanced to the
level of the superior caval-atrial junction for measurement purposes
and the PICC line was cut to length.

In the right chest, a subcutaneous tunnel was created under sterile
conditions and local anesthesia. The 6 French double-lumen cuffed
PICC line was tunneled subcutaneously to the venotomy site.

A peel-away sheath was placed and a 21 cm, 6 French, dual lumen was
inserted to level of the superior caval-atrial junction. A post
procedure spot fluoroscopic was obtained. The catheter easily
aspirated and flushed and was sutured in place. Venotomy site closed
with derma bond. A dressing was placed. The patient tolerated the
procedure well without immediate post procedural complication.
FINDINGS: After catheter placement, the tip lies within the superior
cavoatrial junction. The catheter aspirates and flushes normally and
is ready for immediate use.
IMPRESSION: Successful ultrasound and fluoroscopic guided placement of a right
internal jugular vein approach, 21 cm, 6 French, double-lumen
tunneled power PICC with tip at the superior caval-atrial junction.
The PICC line is ready for immediate use.
